# Patient Record
Sex: Male | Born: 1955 | Race: White | Hispanic: No | Smoking: Former smoker
Health system: Southern US, Community
[De-identification: ages and names within clinical notes are randomized; demographics above are authoritative.]

## PROBLEM LIST (undated history)

## (undated) DIAGNOSIS — I428 Other cardiomyopathies: Secondary | ICD-10-CM

## (undated) DIAGNOSIS — Z95810 Presence of automatic (implantable) cardiac defibrillator: Secondary | ICD-10-CM

## (undated) DIAGNOSIS — G931 Anoxic brain damage, not elsewhere classified: Secondary | ICD-10-CM

## (undated) DIAGNOSIS — D7582 Heparin induced thrombocytopenia (HIT): Secondary | ICD-10-CM

## (undated) DIAGNOSIS — I469 Cardiac arrest, cause unspecified: Secondary | ICD-10-CM

## (undated) DIAGNOSIS — E669 Obesity, unspecified: Secondary | ICD-10-CM

## (undated) DIAGNOSIS — I10 Essential (primary) hypertension: Secondary | ICD-10-CM

## (undated) DIAGNOSIS — D75829 Heparin-induced thrombocytopenia, unspecified: Secondary | ICD-10-CM

## (undated) DIAGNOSIS — I4901 Ventricular fibrillation: Secondary | ICD-10-CM

## (undated) DIAGNOSIS — E039 Hypothyroidism, unspecified: Secondary | ICD-10-CM

## (undated) DIAGNOSIS — I480 Paroxysmal atrial fibrillation: Secondary | ICD-10-CM

## (undated) DIAGNOSIS — I5189 Other ill-defined heart diseases: Secondary | ICD-10-CM

## (undated) DIAGNOSIS — I515 Myocardial degeneration: Secondary | ICD-10-CM

## (undated) DIAGNOSIS — E876 Hypokalemia: Secondary | ICD-10-CM

## (undated) DIAGNOSIS — I472 Ventricular tachycardia: Secondary | ICD-10-CM

## (undated) HISTORY — DX: Other cardiomyopathies: I42.8

## (undated) HISTORY — DX: Ventricular tachycardia: I47.2

## (undated) HISTORY — DX: Anoxic brain damage, not elsewhere classified: G93.1

## (undated) HISTORY — DX: Obesity, unspecified: E66.9

---

## 1998-05-20 ENCOUNTER — Encounter: Payer: Self-pay | Admitting: Neurosurgery

## 1998-05-20 ENCOUNTER — Ambulatory Visit (HOSPITAL_COMMUNITY): Admission: RE | Admit: 1998-05-20 | Discharge: 1998-05-20 | Payer: Self-pay | Admitting: Neurosurgery

## 1998-05-29 ENCOUNTER — Encounter: Payer: Self-pay | Admitting: Neurosurgery

## 1998-06-02 ENCOUNTER — Inpatient Hospital Stay (HOSPITAL_COMMUNITY): Admission: RE | Admit: 1998-06-02 | Discharge: 1998-06-03 | Payer: Self-pay | Admitting: Neurosurgery

## 1998-06-02 ENCOUNTER — Encounter: Payer: Self-pay | Admitting: Neurosurgery

## 1998-12-16 ENCOUNTER — Encounter: Payer: Self-pay | Admitting: Neurosurgery

## 1998-12-16 ENCOUNTER — Ambulatory Visit (HOSPITAL_COMMUNITY): Admission: RE | Admit: 1998-12-16 | Discharge: 1998-12-16 | Payer: Self-pay | Admitting: Neurosurgery

## 1999-01-08 ENCOUNTER — Encounter: Payer: Self-pay | Admitting: Neurosurgery

## 1999-01-08 ENCOUNTER — Inpatient Hospital Stay (HOSPITAL_COMMUNITY): Admission: RE | Admit: 1999-01-08 | Discharge: 1999-01-09 | Payer: Self-pay | Admitting: Neurosurgery

## 1999-02-03 ENCOUNTER — Encounter: Payer: Self-pay | Admitting: Neurosurgery

## 1999-02-03 ENCOUNTER — Encounter: Admission: RE | Admit: 1999-02-03 | Discharge: 1999-02-03 | Payer: Self-pay | Admitting: Neurosurgery

## 1999-03-10 ENCOUNTER — Encounter: Admission: RE | Admit: 1999-03-10 | Discharge: 1999-03-10 | Payer: Self-pay | Admitting: Neurosurgery

## 1999-03-10 ENCOUNTER — Encounter: Payer: Self-pay | Admitting: Neurosurgery

## 1999-05-12 ENCOUNTER — Encounter: Payer: Self-pay | Admitting: Neurosurgery

## 1999-05-12 ENCOUNTER — Encounter: Admission: RE | Admit: 1999-05-12 | Discharge: 1999-05-12 | Payer: Self-pay | Admitting: Neurosurgery

## 2015-10-21 DIAGNOSIS — G931 Anoxic brain damage, not elsewhere classified: Secondary | ICD-10-CM | POA: Insufficient documentation

## 2015-10-21 DIAGNOSIS — I472 Ventricular tachycardia, unspecified: Secondary | ICD-10-CM

## 2015-10-21 HISTORY — DX: Ventricular tachycardia, unspecified: I47.20

## 2015-10-21 HISTORY — DX: Anoxic brain damage, not elsewhere classified: G93.1

## 2015-10-21 HISTORY — DX: Ventricular tachycardia: I47.2

## 2015-11-16 ENCOUNTER — Emergency Department (HOSPITAL_COMMUNITY): Payer: Managed Care, Other (non HMO)

## 2015-11-16 ENCOUNTER — Inpatient Hospital Stay (HOSPITAL_COMMUNITY)
Admission: EM | Admit: 2015-11-16 | Discharge: 2015-12-03 | DRG: 224 | Disposition: A | Discharge status: Home / Self Care | Payer: Managed Care, Other (non HMO) | Attending: Cardiology | Admitting: Cardiology

## 2015-11-16 ENCOUNTER — Other Ambulatory Visit: Payer: Self-pay

## 2015-11-16 ENCOUNTER — Encounter (HOSPITAL_COMMUNITY)
Admission: EM | Disposition: A | Discharge status: Home / Self Care | Payer: Self-pay | Source: Home / Self Care | Attending: Pulmonary Disease

## 2015-11-16 ENCOUNTER — Inpatient Hospital Stay (HOSPITAL_COMMUNITY): Payer: Managed Care, Other (non HMO)

## 2015-11-16 DIAGNOSIS — I472 Ventricular tachycardia, unspecified: Secondary | ICD-10-CM

## 2015-11-16 DIAGNOSIS — I480 Paroxysmal atrial fibrillation: Secondary | ICD-10-CM | POA: Diagnosis present

## 2015-11-16 DIAGNOSIS — I959 Hypotension, unspecified: Secondary | ICD-10-CM | POA: Diagnosis present

## 2015-11-16 DIAGNOSIS — G9341 Metabolic encephalopathy: Secondary | ICD-10-CM | POA: Diagnosis present

## 2015-11-16 DIAGNOSIS — I10 Essential (primary) hypertension: Secondary | ICD-10-CM | POA: Diagnosis present

## 2015-11-16 DIAGNOSIS — D649 Anemia, unspecified: Secondary | ICD-10-CM | POA: Diagnosis present

## 2015-11-16 DIAGNOSIS — I5021 Acute systolic (congestive) heart failure: Secondary | ICD-10-CM | POA: Diagnosis present

## 2015-11-16 DIAGNOSIS — I4891 Unspecified atrial fibrillation: Secondary | ICD-10-CM | POA: Diagnosis not present

## 2015-11-16 DIAGNOSIS — E872 Acidosis: Secondary | ICD-10-CM | POA: Diagnosis present

## 2015-11-16 DIAGNOSIS — J9601 Acute respiratory failure with hypoxia: Secondary | ICD-10-CM | POA: Diagnosis present

## 2015-11-16 DIAGNOSIS — J9600 Acute respiratory failure, unspecified whether with hypoxia or hypercapnia: Secondary | ICD-10-CM

## 2015-11-16 DIAGNOSIS — J690 Pneumonitis due to inhalation of food and vomit: Secondary | ICD-10-CM | POA: Diagnosis not present

## 2015-11-16 DIAGNOSIS — R451 Restlessness and agitation: Secondary | ICD-10-CM | POA: Diagnosis not present

## 2015-11-16 DIAGNOSIS — R4020 Unspecified coma: Secondary | ICD-10-CM | POA: Diagnosis present

## 2015-11-16 DIAGNOSIS — R579 Shock, unspecified: Secondary | ICD-10-CM | POA: Diagnosis not present

## 2015-11-16 DIAGNOSIS — I110 Hypertensive heart disease with heart failure: Secondary | ICD-10-CM | POA: Diagnosis present

## 2015-11-16 DIAGNOSIS — I4901 Ventricular fibrillation: Secondary | ICD-10-CM | POA: Diagnosis present

## 2015-11-16 DIAGNOSIS — R0600 Dyspnea, unspecified: Secondary | ICD-10-CM

## 2015-11-16 DIAGNOSIS — I429 Cardiomyopathy, unspecified: Secondary | ICD-10-CM | POA: Diagnosis present

## 2015-11-16 DIAGNOSIS — Z79899 Other long term (current) drug therapy: Secondary | ICD-10-CM

## 2015-11-16 DIAGNOSIS — Z6839 Body mass index (BMI) 39.0-39.9, adult: Secondary | ICD-10-CM

## 2015-11-16 DIAGNOSIS — I4729 Other ventricular tachycardia: Secondary | ICD-10-CM

## 2015-11-16 DIAGNOSIS — E876 Hypokalemia: Secondary | ICD-10-CM | POA: Diagnosis not present

## 2015-11-16 DIAGNOSIS — G9340 Encephalopathy, unspecified: Secondary | ICD-10-CM

## 2015-11-16 DIAGNOSIS — D75829 Heparin-induced thrombocytopenia, unspecified: Secondary | ICD-10-CM | POA: Diagnosis present

## 2015-11-16 DIAGNOSIS — R402433 Glasgow coma scale score 3-8, at hospital admission: Secondary | ICD-10-CM | POA: Diagnosis present

## 2015-11-16 DIAGNOSIS — E669 Obesity, unspecified: Secondary | ICD-10-CM | POA: Diagnosis present

## 2015-11-16 DIAGNOSIS — Z452 Encounter for adjustment and management of vascular access device: Secondary | ICD-10-CM

## 2015-11-16 DIAGNOSIS — R570 Cardiogenic shock: Secondary | ICD-10-CM | POA: Diagnosis present

## 2015-11-16 DIAGNOSIS — H15092 Other scleritis, left eye: Secondary | ICD-10-CM | POA: Diagnosis present

## 2015-11-16 DIAGNOSIS — D7582 Heparin induced thrombocytopenia (HIT): Secondary | ICD-10-CM | POA: Diagnosis not present

## 2015-11-16 DIAGNOSIS — I469 Cardiac arrest, cause unspecified: Secondary | ICD-10-CM | POA: Diagnosis present

## 2015-11-16 DIAGNOSIS — Z95810 Presence of automatic (implantable) cardiac defibrillator: Secondary | ICD-10-CM | POA: Diagnosis present

## 2015-11-16 DIAGNOSIS — J9690 Respiratory failure, unspecified, unspecified whether with hypoxia or hypercapnia: Secondary | ICD-10-CM

## 2015-11-16 DIAGNOSIS — I361 Nonrheumatic tricuspid (valve) insufficiency: Secondary | ICD-10-CM | POA: Diagnosis present

## 2015-11-16 DIAGNOSIS — Z95818 Presence of other cardiac implants and grafts: Secondary | ICD-10-CM

## 2015-11-16 DIAGNOSIS — K921 Melena: Secondary | ICD-10-CM | POA: Diagnosis not present

## 2015-11-16 DIAGNOSIS — G931 Anoxic brain damage, not elsewhere classified: Secondary | ICD-10-CM

## 2015-11-16 DIAGNOSIS — Z959 Presence of cardiac and vascular implant and graft, unspecified: Secondary | ICD-10-CM | POA: Diagnosis not present

## 2015-11-16 DIAGNOSIS — R739 Hyperglycemia, unspecified: Secondary | ICD-10-CM | POA: Diagnosis present

## 2015-11-16 DIAGNOSIS — I5032 Chronic diastolic (congestive) heart failure: Secondary | ICD-10-CM

## 2015-11-16 DIAGNOSIS — Z8249 Family history of ischemic heart disease and other diseases of the circulatory system: Secondary | ICD-10-CM

## 2015-11-16 DIAGNOSIS — Z9289 Personal history of other medical treatment: Secondary | ICD-10-CM

## 2015-11-16 DIAGNOSIS — G8191 Hemiplegia, unspecified affecting right dominant side: Secondary | ICD-10-CM | POA: Diagnosis not present

## 2015-11-16 HISTORY — DX: Heparin induced thrombocytopenia (HIT): D75.82

## 2015-11-16 HISTORY — DX: Other ill-defined heart diseases: I51.89

## 2015-11-16 HISTORY — DX: Ventricular fibrillation: I46.9

## 2015-11-16 HISTORY — DX: Ventricular fibrillation: I49.01

## 2015-11-16 HISTORY — PX: CARDIAC CATHETERIZATION: SHX172

## 2015-11-16 HISTORY — DX: Essential (primary) hypertension: I10

## 2015-11-16 HISTORY — DX: Paroxysmal atrial fibrillation: I48.0

## 2015-11-16 HISTORY — DX: Presence of automatic (implantable) cardiac defibrillator: Z95.810

## 2015-11-16 HISTORY — DX: Hypokalemia: E87.6

## 2015-11-16 HISTORY — DX: Heparin-induced thrombocytopenia, unspecified: D75.829

## 2015-11-16 HISTORY — DX: Myocardial degeneration: I51.5

## 2015-11-16 LAB — I-STAT CHEM 8, ED
BUN: 24 mg/dL — ABNORMAL HIGH (ref 6–20)
CALCIUM ION: 1.15 mmol/L (ref 1.12–1.23)
CHLORIDE: 101 mmol/L (ref 101–111)
Creatinine, Ser: 1.6 mg/dL — ABNORMAL HIGH (ref 0.61–1.24)
Glucose, Bld: 258 mg/dL — ABNORMAL HIGH (ref 65–99)
HCT: 48 % (ref 39.0–52.0)
HEMOGLOBIN: 16.3 g/dL (ref 13.0–17.0)
POTASSIUM: 2.9 mmol/L — AB (ref 3.5–5.1)
SODIUM: 138 mmol/L (ref 135–145)
TCO2: 18 mmol/L (ref 0–100)

## 2015-11-16 LAB — I-STAT CG4 LACTIC ACID, ED: LACTIC ACID, VENOUS: 11.85 mmol/L — AB (ref 0.5–1.9)

## 2015-11-16 LAB — I-STAT TROPONIN, ED: Troponin i, poc: 0.05 ng/mL (ref 0.00–0.08)

## 2015-11-16 LAB — GLUCOSE, CAPILLARY: Glucose-Capillary: 176 mg/dL — ABNORMAL HIGH (ref 65–99)

## 2015-11-16 SURGERY — LEFT HEART CATH AND CORONARY ANGIOGRAPHY
Anesthesia: LOCAL

## 2015-11-16 MED ORDER — CISATRACURIUM BOLUS VIA INFUSION
0.1000 mg/kg | Freq: Once | INTRAVENOUS | Status: AC
  Administered 2015-11-16: 11.3 mg via INTRAVENOUS
  Filled 2015-11-16: qty 12

## 2015-11-16 MED ORDER — VERAPAMIL HCL 2.5 MG/ML IV SOLN
INTRAVENOUS | Status: DC | PRN
  Administered 2015-11-16: 22:00:00 via INTRA_ARTERIAL

## 2015-11-16 MED ORDER — SODIUM CHLORIDE 0.9 % IV SOLN
3.0000 g | Freq: Three times a day (TID) | INTRAVENOUS | Status: DC
  Administered 2015-11-17 (×3): 3 g via INTRAVENOUS
  Filled 2015-11-16 (×5): qty 3

## 2015-11-16 MED ORDER — LIDOCAINE HCL (PF) 1 % IJ SOLN
INTRAMUSCULAR | Status: AC
  Filled 2015-11-16: qty 30

## 2015-11-16 MED ORDER — IOPAMIDOL (ISOVUE-370) INJECTION 76%
INTRAVENOUS | Status: DC | PRN
  Administered 2015-11-16: 75 mL via INTRA_ARTERIAL

## 2015-11-16 MED ORDER — SODIUM CHLORIDE 0.9 % IV SOLN
100.0000 ug/h | INTRAVENOUS | Status: DC
  Administered 2015-11-16: 100 ug/h via INTRAVENOUS
  Administered 2015-11-17 – 2015-11-18 (×2): 275 ug/h via INTRAVENOUS
  Filled 2015-11-16 (×5): qty 50

## 2015-11-16 MED ORDER — HEPARIN (PORCINE) IN NACL 2-0.9 UNIT/ML-% IJ SOLN
INTRAMUSCULAR | Status: DC | PRN
  Administered 2015-11-16: 1500 mL

## 2015-11-16 MED ORDER — SODIUM CHLORIDE 0.9 % IV SOLN: 250.0000 mL | INTRAVENOUS | Status: DC | PRN

## 2015-11-16 MED ORDER — MIDAZOLAM HCL 2 MG/2ML IJ SOLN: 1.0000 mg | Freq: Once | INTRAMUSCULAR | Status: DC

## 2015-11-16 MED ORDER — SODIUM CHLORIDE 0.9 % IV SOLN
10.0000 ug/h | INTRAVENOUS | Status: DC
  Filled 2015-11-16: qty 50

## 2015-11-16 MED ORDER — POTASSIUM CHLORIDE 10 MEQ/50ML IV SOLN
10.0000 meq | INTRAVENOUS | Status: AC
  Administered 2015-11-17 (×4): 10 meq via INTRAVENOUS
  Filled 2015-11-16: qty 50

## 2015-11-16 MED ORDER — SODIUM CHLORIDE 0.9% FLUSH
3.0000 mL | Freq: Two times a day (BID) | INTRAVENOUS | Status: DC
  Administered 2015-11-17 – 2015-11-18 (×2): 3 mL via INTRAVENOUS

## 2015-11-16 MED ORDER — CISATRACURIUM BOLUS VIA INFUSION
0.0500 mg/kg | INTRAVENOUS | Status: DC | PRN
  Filled 2015-11-16: qty 6

## 2015-11-16 MED ORDER — MIDAZOLAM BOLUS VIA INFUSION
1.0000 mg | INTRAVENOUS | Status: DC | PRN
  Filled 2015-11-16: qty 1

## 2015-11-16 MED ORDER — FAMOTIDINE IN NACL 20-0.9 MG/50ML-% IV SOLN
20.0000 mg | Freq: Two times a day (BID) | INTRAVENOUS | Status: DC
  Administered 2015-11-17 – 2015-11-18 (×4): 20 mg via INTRAVENOUS
  Filled 2015-11-16 (×4): qty 50

## 2015-11-16 MED ORDER — SODIUM CHLORIDE 0.9 % WEIGHT BASED INFUSION
1.0000 mL/kg/h | INTRAVENOUS | Status: AC
  Administered 2015-11-17: 1 mL/kg/h via INTRAVENOUS

## 2015-11-16 MED ORDER — FENTANYL BOLUS VIA INFUSION
25.0000 ug | INTRAVENOUS | Status: DC | PRN
  Filled 2015-11-16: qty 25

## 2015-11-16 MED ORDER — ASPIRIN 300 MG RE SUPP
300.0000 mg | Freq: Once | RECTAL | Status: AC
  Administered 2015-11-16: 300 mg via RECTAL

## 2015-11-16 MED ORDER — SODIUM CHLORIDE 0.9% FLUSH: 3.0000 mL | INTRAVENOUS | Status: DC | PRN

## 2015-11-16 MED ORDER — AMIODARONE HCL IN DEXTROSE 360-4.14 MG/200ML-% IV SOLN
30.0000 mg/h | INTRAVENOUS | Status: DC
  Administered 2015-11-17 – 2015-11-19 (×5): 30 mg/h via INTRAVENOUS
  Filled 2015-11-16 (×5): qty 200

## 2015-11-16 MED ORDER — HEPARIN SODIUM (PORCINE) 5000 UNIT/ML IJ SOLN
4000.0000 [IU] | INTRAMUSCULAR | Status: AC
  Administered 2015-11-16: 4000 [IU] via INTRAVENOUS

## 2015-11-16 MED ORDER — ARTIFICIAL TEARS OP OINT
1.0000 "application " | TOPICAL_OINTMENT | Freq: Three times a day (TID) | OPHTHALMIC | Status: DC
  Administered 2015-11-17 (×4): 1 via OPHTHALMIC
  Filled 2015-11-16: qty 3.5

## 2015-11-16 MED ORDER — CISATRACURIUM BESYLATE (PF) 200 MG/20ML IV SOLN
1.0000 ug/kg/min | INTRAVENOUS | Status: DC
  Administered 2015-11-16: 1 ug/kg/min via INTRAVENOUS
  Administered 2015-11-17: 1.5 ug/kg/min via INTRAVENOUS
  Filled 2015-11-16 (×2): qty 20

## 2015-11-16 MED ORDER — PROPOFOL 1000 MG/100ML IV EMUL
INTRAVENOUS | Status: AC
  Administered 2015-11-16: 10 ug/kg/min
  Filled 2015-11-16: qty 100

## 2015-11-16 MED ORDER — LIDOCAINE HCL (PF) 1 % IJ SOLN
INTRAMUSCULAR | Status: DC | PRN
  Administered 2015-11-16: 2 mL

## 2015-11-16 MED ORDER — IOPAMIDOL (ISOVUE-370) INJECTION 76%
INTRAVENOUS | Status: AC
  Filled 2015-11-16: qty 100

## 2015-11-16 MED ORDER — VERAPAMIL HCL 2.5 MG/ML IV SOLN
INTRAVENOUS | Status: AC
  Filled 2015-11-16: qty 2

## 2015-11-16 MED ORDER — DEXTROSE 5 % IV SOLN
2.0000 ug/min | INTRAVENOUS | Status: DC
  Administered 2015-11-17: 5 ug/min via INTRAVENOUS
  Filled 2015-11-16: qty 4

## 2015-11-16 MED ORDER — AMIODARONE HCL IN DEXTROSE 360-4.14 MG/200ML-% IV SOLN
60.0000 mg/h | INTRAVENOUS | Status: DC
  Administered 2015-11-17: 60 mg/h via INTRAVENOUS
  Filled 2015-11-16: qty 200

## 2015-11-16 MED ORDER — FENTANYL CITRATE (PF) 100 MCG/2ML IJ SOLN
50.0000 ug | Freq: Once | INTRAMUSCULAR | Status: AC
  Administered 2015-11-16: 50 ug via INTRAVENOUS

## 2015-11-16 MED ORDER — EPINEPHRINE HCL 1 MG/ML IJ SOLN
0.5000 ug/min | INTRAMUSCULAR | Status: DC
  Administered 2015-11-16: 10 ug/min via INTRAVENOUS
  Filled 2015-11-16: qty 4

## 2015-11-16 MED ORDER — ENOXAPARIN SODIUM 40 MG/0.4ML ~~LOC~~ SOLN: 40.0000 mg | SUBCUTANEOUS | Status: DC

## 2015-11-16 MED ORDER — ASPIRIN 81 MG PO CHEW
CHEWABLE_TABLET | ORAL | Status: AC
  Filled 2015-11-16: qty 4

## 2015-11-16 MED ORDER — SODIUM CHLORIDE 0.9 % IV BOLUS (SEPSIS)
2000.0000 mL | Freq: Once | INTRAVENOUS | Status: AC
  Administered 2015-11-16: 2000 mL via INTRAVENOUS

## 2015-11-16 MED ORDER — MIDAZOLAM HCL 5 MG/ML IJ SOLN
2.0000 mg/h | INTRAMUSCULAR | Status: DC
  Administered 2015-11-17: 3 mg/h via INTRAVENOUS
  Administered 2015-11-17 (×2): 5 mg/h via INTRAVENOUS
  Administered 2015-11-18: 8 mg/h via INTRAVENOUS
  Administered 2015-11-18: 5 mg/h via INTRAVENOUS
  Filled 2015-11-16 (×6): qty 10

## 2015-11-16 MED ORDER — ASPIRIN 300 MG RE SUPP: 300.0000 mg | RECTAL | Status: DC

## 2015-11-16 MED ORDER — HEPARIN SODIUM (PORCINE) 1000 UNIT/ML IJ SOLN
INTRAMUSCULAR | Status: AC
  Filled 2015-11-16: qty 1

## 2015-11-16 MED ORDER — SODIUM CHLORIDE 0.9 % IV SOLN: 2000.0000 mL | Freq: Once | INTRAVENOUS | Status: DC

## 2015-11-16 MED ORDER — HEPARIN SODIUM (PORCINE) 1000 UNIT/ML IJ SOLN
INTRAMUSCULAR | Status: DC | PRN
  Administered 2015-11-16: 2000 [IU] via INTRAVENOUS

## 2015-11-16 SURGICAL SUPPLY — 13 items
CATH INFINITI 5 FR JL3.5 (CATHETERS) ×1 IMPLANT
CATH INFINITI 5FR MULTPACK ANG (CATHETERS) ×2 IMPLANT
DEVICE RAD COMP TR BAND LRG (VASCULAR PRODUCTS) ×1 IMPLANT
GLIDESHEATH SLEND SS 6F .021 (SHEATH) ×2 IMPLANT
HOVERMATT SINGLE USE (MISCELLANEOUS) ×2 IMPLANT
KIT ENCORE 26 ADVANTAGE (KITS) ×1 IMPLANT
KIT HEART LEFT (KITS) ×2 IMPLANT
PACK CARDIAC CATHETERIZATION (CUSTOM PROCEDURE TRAY) ×2 IMPLANT
SYR MEDRAD MARK V 150ML (SYRINGE) ×2 IMPLANT
TRANSDUCER W/STOPCOCK (MISCELLANEOUS) ×2 IMPLANT
TUBING CIL FLEX 10 FLL-RA (TUBING) ×2 IMPLANT
WIRE HI TORQ VERSACORE-J 145CM (WIRE) ×2 IMPLANT
WIRE SAFE-T 1.5MM-J .035X260CM (WIRE) ×1 IMPLANT

## 2015-11-16 NOTE — H&P (Addendum)
   C. C "cardiac arrest, code STEMI" HPI: Patient is intubated and sedated so history from ED MD and EMS "60 y/o man with unknown medical history had a witnessed cardiac arrest after coughing. EMS was called. Dont know what was the initial rhythm. ACLS protocol was initialized. Subsequent rhythm strips showed unstable Vt/ Vfib and he was shocked total of 4 times with ROSC. He was also given amiodarone and intubated. In ED his EKG shows sinus rhythm, IVCD andLAD with possible old IMI without any ischemic changes. He is started on hypothemia protocol Also given 5000 units heparin IV and rectal asa OGT  ED MD spoke with Dr. Sw[place] IC on call and plan to proceed with emergent LHC +/- SB PCI Review of Systems:    unable to obtain  No past medical history on file.  No current facility-administered medications on file prior to encounter.    No current outpatient prescriptions on file prior to encounter.      Allergies not on file  Social History   Social History  . Marital status: Married    Spouse name: N/A  . Number of children: N/A  . Years of education: N/A   Occupational History  . Not on file.   Social History Main Topics  . Smoking status: Not on file  . Smokeless tobacco: Not on file  . Alcohol use Not on file  . Drug use: Unknown  . Sexual activity: Not on file   Other Topics Concern  . Not on file   Social History Narrative  . No narrative on file    No family history on file.  PHYSICAL EXAM: Vitals:   11/16/15 2125 11/16/15 2132  BP: 118/80 (!) 75/53  Pulse: 96 95  Resp: (!) 28 23  Temp:     General:  Well appearing. Intubated and sedated HEENT: normal Neck: supple. no JVD. Carotids 2+ bilat;  Cor: PMI nondisplaced. Regular rate & rhythm Lungs: clear anteriorly  Abdomen: soft, nontender, nondistended. No hepatosplenomegaly. Extremities: no cyanosis, clubbing, rash, edema Neuro: intubated, sedated   ECG:  NSR, LAD, IVCD, old IMI   No results  found for this or any previous visit (from the past 24 hour(s)). No results found.   ASSESSMENT: 1. Out of hospital witnessed cardiac arrest. Unknown initial rhythm but had subsequent Vt/VFib. EKG now does not show STE 2. Acute resp failure   PLAN/DISCUSSION: 1. Admit to ICU per CCM 2. Hypothermia protocol per CCM  3. Emergent cath  4. Heparin and asa given  5. Standard labs   [employer]

## 2015-11-16 NOTE — Progress Notes (Signed)
Patient transported from Cath Lab to room 2H-13 without complications.

## 2015-11-16 NOTE — ED Triage Notes (Addendum)
Per EMS: Pt coughing, arrested. 4 defib shocks prior to EMS arrival. EMS arrived on scene @ 2015. EMS defib x 1, cardioversion x 1, defib'd one more time. EMS gave 150mg  amioderone, 200 fentanyl, and 10 versed. ETCO2 = 29. ROSC prior to arrival.

## 2015-11-16 NOTE — H&P (Signed)
PULMONARY / CRITICAL CARE MEDICINE   Name: Arthur Chase D Stoudt MRN: [id number] DOB: 04/20/1955    ADMISSION DATE:  11/16/2015 CONSULTATION DATE:  11/16/2015  REFERRING MD:  Dr. Peter SwazilandJordan  CHIEF COMPLAINT:  Cardiac arrest  HISTORY OF PRESENT ILLNESS:   60 year old male with obesity and hypertension was [facility] on 11/16/2015 after a cardiac arrest at home. His children provide history as he was comatose on our evaluation. They state that he had been in his usual state of health but on 11/16/2015 they could hear him breathing heavily from another room. They came immediately found him to be unresponsive. They called 911 and started CPR. The fire department arrived nearly immediately and her vital shock via AED and CPR. Paramedics arrived and found him to be in V. fib. Total time of CPR is estimated to be approximately 16 minutes. En route he had V. fib on 2 other occasions which were successfully defibrillated. In the emergency department he was noted to be agitated while intubated. He was taken to the Cath Lab. In the Cath Lab he was noted to have clean coronary arteries and a normal left ventricular ejection fraction. Left ventricular end-diastolic pressure was 18.  PAST MEDICAL HISTORY :  He  has no past medical history on file.  PAST SURGICAL HISTORY: He  has no past surgical history on file.  Allergies not on file  No current facility-administered medications on file prior to encounter.    No current outpatient prescriptions on file prior to encounter.    FAMILY HISTORY:  His has no family status information on file.    SOCIAL HISTORY: He    REVIEW OF SYSTEMS:   Cannot obtain due to intubation  SUBJECTIVE:  As above  VITAL SIGNS: BP 93/61   Pulse 83   Temp (!) 96.4 F (35.8 C) (Rectal)   Resp (!) 25   Wt 249 lb 1.9 oz (113 kg)   SpO2 100%   HEMODYNAMICS:    VENTILATOR SETTINGS: Vent Mode: PRVC FiO2 (%):  [100 %] 100 % Set  Rate:  [20 bmp-24 bmp] 24 bmp Vt Set:  [530 mL] 530 mL PEEP:  [16 cmH20] 16 cmH20 Plateau Pressure:  [28 cmH20] 28 cmH20  INTAKE / OUTPUT: No intake/output data recorded.  PHYSICAL EXAMINATION: General:  Obese, on ventilator Neuro:  Eyes open, flex his arms towards face but no purposeful movements does not localize to pain, does not track with eyes, does not follow commands HEENT:  Normocephalic atraumatic, endotracheal tube in place Cardiovascular:  Regular rate and rhythm, no murmurs gallops or rubs, 2+ radial pulses Lungs:  Few crackles bilaterally, vent supported breaths Abdomen:  Obese abdomen, nontender nondistended Musculoskeletal:  Normal bulk and tone Skin:  Cool to the touch, Refill intact,   LABS:  BMET  Recent Labs Lab 11/16/15 2143  NA 138  K 2.9*  CL 101  BUN 24*  CREATININE 1.60*  GLUCOSE 258*    Electrolytes No results for input(s): CALCIUM, MG, PHOS in the last 168 hours.  CBC  Recent Labs Lab 11/16/15 2143  HGB 16.3  HCT 48.0    Coag's No results for input(s): APTT, INR in the last 168 hours.  Sepsis Markers  Recent Labs Lab 11/16/15 2143  LATICACIDVEN 11.85*    ABG No results for input(s): PHART, PCO2ART, PO2ART in the last 168 hours.  Liver Enzymes No results for input(s): AST, ALT, ALKPHOS, BILITOT, ALBUMIN in the last 168 hours.  Cardiac  Enzymes No results for input(s): TROPONINI, PROBNP in the last 168 hours.  Glucose No results for input(s): GLUCAP in the last 168 hours.  Imaging Dg Chest Portable 1 View  Result Date: 11/16/2015 CLINICAL DATA:  Respiratory failure.  Intubation. EXAM: PORTABLE CHEST 1 VIEW COMPARISON:  None. FINDINGS: The endotracheal tube is 3.3 cm above the carina. The enteric tube extends at least to the distal esophagus, continuing B on the inferior edge of the image. Diffuse airspace opacities are present bilaterally, left worse than right. No large pneumothorax or large effusion evident on this supine  portable radiograph. IMPRESSION: 1. Satisfactorily positioned ETT. Enteric tube extends beyond the inferior edge of the image but reaches at least as far as the distal esophagus. 2. Diffuse airspace opacities bilaterally, left worse than right. Electronically Signed   By: Ellery Plunk M.D.   On: 11/16/2015 21:45     STUDIES:  11/16/2015 left heart catheterization clean coronary arteries, normal LVEDP, normal LV function  CULTURES: 11/16/2015 respiratory culture  ANTIBIOTICS: 11/16/2015 Unasyn  SIGNIFICANT EVENTS: 11/16/2015 admission  LINES/TUBES: 11/16/2015 endotracheal tube 11/11/2015 central venous line  DISCUSSION: 60 year old male with a past medical history significant for obesity and hypertension was admitted with a cardiac arrest on 11/16/2015. He was noted to be in ventricular fibrillation on arrival. No evidence of coronary artery disease based on left heart catheterization. Differential diagnosis of his cardiac arrest includes a primary cardiac arrhythmia (most likely) versus less likely an acute CNS event or a pulmonary embolism.  ASSESSMENT / PLAN:  PULMONARY A: Acute respiratory failure with hypoxemia Possible aspiration pneumonia Possible acute pulmonary edema P:   Check pro BNP See ID Full ventilatory support Ventilator associated pneumonia prevention protocol Check ABG  CARDIOVASCULAR A:  V. fib arrest P:  Check d-dimer Check echocardiogram Empiric full dose heparin if d-dimer positive Consider CT angiogram chest when appropriate Postcardiac arrest management per cardiology Amiodarone  RENAL A:   Hypokalemia P:   Stat repeat basic metabolic panel Monitor BMET and UOP Replace electrolytes as needed   GASTROINTESTINAL A:   No acute issues P:   OG tube Pepcid for stress ulcer prophylaxis  HEMATOLOGIC A:   No acute issues P:  Monitor for bleeding  INFECTIOUS A:   Possible aspiration pneumonia P:   Respiratory culture Start  Unasyn  ENDOCRINE A:   Hyperglycemia, common with induced hypothermia   P:   Monitor glucose, add sliding scale insulin if necessary  NEUROLOGIC A:   Cardiac arrest, at risk for anoxic brain injury P:   RASS goal: -5 Induced hypothermia protocol with heavy sedation and paralytics Stat head CT    FAMILY  - Updates: Sons neck and John updated bedside 11/16/2015  - Inter-disciplinary family meet or Palliative Care meeting due by:  Day 7  My cc time 50 minutes  Heber Camargito, MD [facility] Pager: [phone] Cell: [phone] A 11/16/2015, 11:17 PM

## 2015-11-16 NOTE — ED Notes (Signed)
MD at bedside. 

## 2015-11-16 NOTE — Procedures (Signed)
Central Venous Catheter Insertion Procedure Note Arthur Chase [id number] [birth year]  Procedure: Insertion of Central Venous Catheter Indications: Assessment of intravascular volume, Drug and/or fluid administration and Frequent blood sampling  Procedure Details Consent: Risks of procedure as well as the alternatives and risks of each were explained to the (patient/caregiver).  Consent for procedure obtained. Time Out: Verified patient identification, verified procedure, site/side was marked, verified correct patient position, special equipment/implants available, medications/allergies/relevent history reviewed, required imaging and test results available.  Performed  Maximum sterile technique was used including antiseptics, cap, gloves, gown, hand hygiene, mask and sheet. Skin prep: Chlorhexidine; local anesthetic administered A antimicrobial bonded/coated triple lumen catheter was placed in the left internal jugular vein using the Seldinger technique. Ultrasound guidance used.Yes.   Catheter placed to 20 cm. Blood aspirated via all 3 ports and then flushed x 3. Line sutured x 2 and dressing applied.  Evaluation Blood flow good Complications: No apparent complications Patient did tolerate procedure well. Chest X-ray ordered to verify placement.  CXR: pending.  Joneen Roach, AGACNP-BC [facility] Pulmonology/Critical Care Pager [phone] or [phone]  11/16/2015 11:47 PM

## 2015-11-16 NOTE — ED Notes (Signed)
2L COLD saline and ice packs placed.

## 2015-11-16 NOTE — Progress Notes (Signed)
  Amiodarone Drug - Drug Interaction Consult Note  Recommendations: none Amiodarone is metabolized by the cytochrome P450 system and therefore has the potential to cause many drug interactions. Amiodarone has an average plasma half-life of 50 days (range 20 to 100 days).   There is potential for drug interactions to occur several weeks or months after stopping treatment and the onset of drug interactions may be slow after initiating amiodarone.   []  Statins: Increased risk of myopathy. Simvastatin- restrict dose to 20mg  daily. Other statins: counsel patients to report any muscle pain or weakness immediately.  []  Anticoagulants: Amiodarone can increase anticoagulant effect. Consider warfarin dose reduction. Patients should be monitored closely and the dose of anticoagulant altered accordingly, remembering that amiodarone levels take several weeks to stabilize.  []  Antiepileptics: Amiodarone can increase plasma concentration of phenytoin, the dose should be reduced. Note that small changes in phenytoin dose can result in large changes in levels. Monitor patient and counsel on signs of toxicity.  []  Beta blockers: increased risk of bradycardia, AV block and myocardial depression. Sotalol - avoid concomitant use.  []   Calcium channel blockers (diltiazem and verapamil): increased risk of bradycardia, AV block and myocardial depression.  []   Cyclosporine: Amiodarone increases levels of cyclosporine. Reduced dose of cyclosporine is recommended.  []  Digoxin dose should be halved when amiodarone is started.  []  Diuretics: increased risk of cardiotoxicity if hypokalemia occurs.  []  Oral hypoglycemic agents (glyburide, glipizide, glimepiride): increased risk of hypoglycemia. Patient's glucose levels should be monitored closely when initiating amiodarone therapy.   []  Drugs that prolong the QT interval:  Torsades de pointes risk may be increased with concurrent use - avoid if possible.  Monitor QTc,  also keep magnesium/potassium WNL if concurrent therapy can't be avoided. Marland Kitchen Antibiotics: e.g. fluoroquinolones, erythromycin. . Antiarrhythmics: e.g. quinidine, procainamide, disopyramide, sotalol. . Antipsychotics: e.g. phenothiazines, haloperidol.  . Lithium, tricyclic antidepressants, and methadone. Thank Lottie Mussel T  11/16/2015 11:56 PM

## 2015-11-16 NOTE — Progress Notes (Signed)
Pharmacy Antibiotic Note  Arthur Chase is a 60 y.o. male admitted on 11/16/2015 with aspiration PNA.  Pharmacy has been consulted for Unasyn dosing. Wt 113 lg. Creat 1.6, Lactate 11.85, being cooled via hypothermia protocol. CXR: diffuse airspace opacities bilaterally, L>R.  Pt s/p cardiac arrest, CPR, cath lab noted clean coronary arteries and normal EF.   Plan: Unasyn 3 gm IV q8h  Weight: 249 lb 1.9 oz (113 kg)  Temp (24hrs), Avg:96.4 F (35.8 C), Min:96.4 F (35.8 C), Max:96.4 F (35.8 C)   Recent Labs Lab 11/16/15 2143  CREATININE 1.60*  LATICACIDVEN 11.85*    CrCl cannot be calculated (Unknown ideal weight.).    Allergies not on file Thank you for allowing pharmacy to be a part of this patient's care.  Herby Abraham, Pharm.D. [phone] 11/16/2015 11:51 PM

## 2015-11-16 NOTE — ED Notes (Signed)
CCNP at bedside.  

## 2015-11-16 NOTE — Progress Notes (Signed)
   11/16/15 2200  Clinical Encounter Type  Visited With Family  Visit Type ED;Code  Referral From Nurse  Spiritual Encounters  Spiritual Needs Prayer;Emotional  Stress Factors  Family Stress Factors Lack of knowledge;Loss of control  Chaplain met family in ED, provided prayer and comfort, facilitated medical updates, provided hospitality. Escorted family to [facility] waiting room while cath in progress. Patient likely to be moved directly to [facility] after procedure. Will check in again with family after cath. Matisse Salais, Chaplain

## 2015-11-17 ENCOUNTER — Inpatient Hospital Stay (HOSPITAL_COMMUNITY): Payer: Managed Care, Other (non HMO)

## 2015-11-17 ENCOUNTER — Encounter (HOSPITAL_COMMUNITY): Payer: Self-pay | Admitting: Cardiology

## 2015-11-17 DIAGNOSIS — R570 Cardiogenic shock: Secondary | ICD-10-CM

## 2015-11-17 DIAGNOSIS — I469 Cardiac arrest, cause unspecified: Secondary | ICD-10-CM

## 2015-11-17 DIAGNOSIS — R579 Shock, unspecified: Secondary | ICD-10-CM

## 2015-11-17 DIAGNOSIS — G9340 Encephalopathy, unspecified: Secondary | ICD-10-CM

## 2015-11-17 LAB — BASIC METABOLIC PANEL
ANION GAP: 14 (ref 5–15)
ANION GAP: 13 (ref 5–15)
ANION GAP: 8 (ref 5–15)
Anion gap: 13 (ref 5–15)
Anion gap: 12 (ref 5–15)
Anion gap: 11 (ref 5–15)
BUN: 16 mg/dL (ref 6–20)
BUN: 17 mg/dL (ref 6–20)
BUN: 19 mg/dL (ref 6–20)
BUN: 19 mg/dL (ref 6–20)
BUN: 18 mg/dL (ref 6–20)
BUN: 17 mg/dL (ref 6–20)
CALCIUM: 7.8 mg/dL — AB (ref 8.9–10.3)
CALCIUM: 7.8 mg/dL — AB (ref 8.9–10.3)
CALCIUM: 7.5 mg/dL — AB (ref 8.9–10.3)
CALCIUM: 7.1 mg/dL — AB (ref 8.9–10.3)
CHLORIDE: 110 mmol/L (ref 101–111)
CO2: 18 mmol/L — ABNORMAL LOW (ref 22–32)
CO2: 17 mmol/L — ABNORMAL LOW (ref 22–32)
CO2: 15 mmol/L — ABNORMAL LOW (ref 22–32)
CO2: 16 mmol/L — AB (ref 22–32)
CO2: 18 mmol/L — ABNORMAL LOW (ref 22–32)
CO2: 18 mmol/L — ABNORMAL LOW (ref 22–32)
CREATININE: 0.96 mg/dL (ref 0.61–1.24)
CREATININE: 0.98 mg/dL (ref 0.61–1.24)
Calcium: 7.7 mg/dL — ABNORMAL LOW (ref 8.9–10.3)
Calcium: 7.6 mg/dL — ABNORMAL LOW (ref 8.9–10.3)
Chloride: 107 mmol/L (ref 101–111)
Chloride: 109 mmol/L (ref 101–111)
Chloride: 108 mmol/L (ref 101–111)
Chloride: 106 mmol/L (ref 101–111)
Chloride: 109 mmol/L (ref 101–111)
Creatinine, Ser: 1.01 mg/dL (ref 0.61–1.24)
Creatinine, Ser: 0.99 mg/dL (ref 0.61–1.24)
Creatinine, Ser: 0.91 mg/dL (ref 0.61–1.24)
Creatinine, Ser: 1 mg/dL (ref 0.61–1.24)
GFR calc Af Amer: >60 mL/min (ref >60)
GFR calc Af Amer: >60 mL/min (ref >60)
GFR calc Af Amer: >60 mL/min (ref >60)
GFR calc non Af Amer: >60 mL/min (ref >60)
GLUCOSE: 196 mg/dL — AB (ref 65–99)
GLUCOSE: 173 mg/dL — AB (ref 65–99)
GLUCOSE: 191 mg/dL — AB (ref 65–99)
Glucose, Bld: 163 mg/dL — ABNORMAL HIGH (ref 65–99)
Glucose, Bld: 175 mg/dL — ABNORMAL HIGH (ref 65–99)
Glucose, Bld: 180 mg/dL — ABNORMAL HIGH (ref 65–99)
POTASSIUM: 2.2 mmol/L — AB (ref 3.5–5.1)
POTASSIUM: 2.6 mmol/L — AB (ref 3.5–5.1)
POTASSIUM: 2.9 mmol/L — AB (ref 3.5–5.1)
POTASSIUM: 3.2 mmol/L — AB (ref 3.5–5.1)
POTASSIUM: 3.3 mmol/L — AB (ref 3.5–5.1)
Potassium: 2.6 mmol/L — CL (ref 3.5–5.1)
SODIUM: 135 mmol/L (ref 135–145)
Sodium: 138 mmol/L (ref 135–145)
Sodium: 138 mmol/L (ref 135–145)
Sodium: 137 mmol/L (ref 135–145)
Sodium: 137 mmol/L (ref 135–145)
Sodium: 137 mmol/L (ref 135–145)

## 2015-11-17 LAB — HEPARIN LEVEL (UNFRACTIONATED)
HEPARIN UNFRACTIONATED: 0.1 [IU]/mL — AB (ref 0.30–0.70)
Heparin Unfractionated: 0.34 [IU]/mL (ref 0.30–0.70)

## 2015-11-17 LAB — CBC
HEMATOCRIT: 48.5 % (ref 39.0–52.0)
HEMATOCRIT: 48.3 % (ref 39.0–52.0)
HEMOGLOBIN: 16.2 g/dL (ref 13.0–17.0)
Hemoglobin: 16.7 g/dL (ref 13.0–17.0)
MCH: 33.4 pg (ref 26.0–34.0)
MCH: 32.3 pg (ref 26.0–34.0)
MCHC: 34.4 g/dL (ref 30.0–36.0)
MCHC: 33.5 g/dL (ref 30.0–36.0)
MCV: 97 fL (ref 78.0–100.0)
MCV: 96.4 fL (ref 78.0–100.0)
PLATELETS: 227 10*3/uL (ref 150–400)
Platelets: 193 10*3/uL (ref 150–400)
RBC: 5 MIL/uL (ref 4.22–5.81)
RBC: 5.01 MIL/uL (ref 4.22–5.81)
RDW: 12.8 % (ref 11.5–15.5)
RDW: 13 % (ref 11.5–15.5)
WBC: 7.7 10*3/uL (ref 4.0–10.5)
WBC: 5.9 10*3/uL (ref 4.0–10.5)

## 2015-11-17 LAB — POCT I-STAT 3, ART BLOOD GAS (G3+)
ACID-BASE DEFICIT: 15 mmol/L — AB (ref 0.0–2.0)
Acid-base deficit: 11 mmol/L — ABNORMAL HIGH (ref 0.0–2.0)
BICARBONATE: 18.4 meq/L — AB (ref 20.0–24.0)
BICARBONATE: 14.4 meq/L — AB (ref 20.0–24.0)
O2 SAT: 86 %
O2 SAT: 90 %
PCO2 ART: 46.1 mmHg — AB (ref 35.0–45.0)
PCO2 ART: 37 mmHg (ref 35.0–45.0)
PH ART: 7.192 — AB (ref 7.350–7.450)
PO2 ART: 54 mmHg — AB (ref 80.0–100.0)
PO2 ART: 58 mmHg — AB (ref 80.0–100.0)
Patient temperature: 34.2
Patient temperature: 32.8
TCO2: 20 mmol/L (ref 0–100)
TCO2: 16 mmol/L (ref 0–100)
pH, Arterial: 7.174 — CL (ref 7.350–7.450)

## 2015-11-17 LAB — GLUCOSE, CAPILLARY
GLUCOSE-CAPILLARY: 210 mg/dL — AB (ref 65–99)
GLUCOSE-CAPILLARY: 178 mg/dL — AB (ref 65–99)
GLUCOSE-CAPILLARY: 207 mg/dL — AB (ref 65–99)
GLUCOSE-CAPILLARY: 170 mg/dL — AB (ref 65–99)
GLUCOSE-CAPILLARY: 154 mg/dL — AB (ref 65–99)
GLUCOSE-CAPILLARY: 176 mg/dL — AB (ref 65–99)
GLUCOSE-CAPILLARY: 185 mg/dL — AB (ref 65–99)
Glucose-Capillary: 158 mg/dL — ABNORMAL HIGH (ref 65–99)
Glucose-Capillary: 177 mg/dL — ABNORMAL HIGH (ref 65–99)
Glucose-Capillary: 182 mg/dL — ABNORMAL HIGH (ref 65–99)
Glucose-Capillary: 183 mg/dL — ABNORMAL HIGH (ref 65–99)
Glucose-Capillary: 190 mg/dL — ABNORMAL HIGH (ref 65–99)
Glucose-Capillary: 194 mg/dL — ABNORMAL HIGH (ref 65–99)
Glucose-Capillary: 204 mg/dL — ABNORMAL HIGH (ref 65–99)

## 2015-11-17 LAB — APTT
APTT: 34 s (ref 24–36)
APTT: 41 s — AB (ref 24–36)

## 2015-11-17 LAB — TROPONIN I
TROPONIN I: 0.96 ng/mL — AB (ref <0.03)
TROPONIN I: 1.83 ng/mL — AB (ref <0.03)
TROPONIN I: 1.67 ng/mL — AB (ref <0.03)
TROPONIN I: 0.89 ng/mL — AB (ref <0.03)

## 2015-11-17 LAB — CARBOXYHEMOGLOBIN
Carboxyhemoglobin: 0.4 % — ABNORMAL LOW (ref 0.5–1.5)
Methemoglobin: 0.8 % (ref 0.0–1.5)
O2 Saturation: 64.1 %
Total hemoglobin: 17 g/dL (ref 13.5–18.0)

## 2015-11-17 LAB — POCT I-STAT, CHEM 8
BUN: 20 mg/dL (ref 6–20)
BUN: 20 mg/dL (ref 6–20)
CALCIUM ION: 1.13 mmol/L (ref 1.12–1.23)
CHLORIDE: 106 mmol/L (ref 101–111)
CREATININE: 0.8 mg/dL (ref 0.61–1.24)
Calcium, Ion: 1.14 mmol/L (ref 1.12–1.23)
Chloride: 106 mmol/L (ref 101–111)
Creatinine, Ser: 0.7 mg/dL (ref 0.61–1.24)
GLUCOSE: 193 mg/dL — AB (ref 65–99)
GLUCOSE: 167 mg/dL — AB (ref 65–99)
HCT: 49 % (ref 39.0–52.0)
HEMATOCRIT: 51 % (ref 39.0–52.0)
HEMOGLOBIN: 17.3 g/dL — AB (ref 13.0–17.0)
Hemoglobin: 16.7 g/dL (ref 13.0–17.0)
POTASSIUM: 2.3 mmol/L — AB (ref 3.5–5.1)
Potassium: 2.8 mmol/L — ABNORMAL LOW (ref 3.5–5.1)
Sodium: 143 mmol/L (ref 135–145)
Sodium: 141 mmol/L (ref 135–145)
TCO2: 20 mmol/L (ref 0–100)
TCO2: 16 mmol/L (ref 0–100)

## 2015-11-17 LAB — PHOSPHORUS: PHOSPHORUS: 3 mg/dL (ref 2.5–4.6)

## 2015-11-17 LAB — MRSA PCR SCREENING: MRSA by PCR: NEGATIVE

## 2015-11-17 LAB — BLOOD GAS, ARTERIAL
ACID-BASE DEFICIT: 10.6 mmol/L — AB (ref 0.0–2.0)
Bicarbonate: 16.2 mEq/L — ABNORMAL LOW (ref 20.0–24.0)
FIO2: 100
MECHVT: 530 mL
O2 SAT: 86 %
PATIENT TEMPERATURE: 91.4
PCO2 ART: 37.2 mmHg (ref 35.0–45.0)
PEEP/CPAP: 16 cmH2O
PH ART: 7.233 — AB (ref 7.350–7.450)
RATE: 31 resp/min
TCO2: 17.6 mmol/L (ref 0–100)
pO2, Arterial: 49.3 mmHg — ABNORMAL LOW (ref 80.0–100.0)

## 2015-11-17 LAB — POCT I-STAT 7, (LYTES, BLD GAS, ICA,H+H)
ACID-BASE DEFICIT: 11 mmol/L — AB (ref 0.0–2.0)
Acid-base deficit: 12 mmol/L — ABNORMAL HIGH (ref 0.0–2.0)
BICARBONATE: 15.6 meq/L — AB (ref 20.0–24.0)
Bicarbonate: 17 mEq/L — ABNORMAL LOW (ref 20.0–24.0)
Calcium, Ion: 1.09 mmol/L — ABNORMAL LOW (ref 1.12–1.23)
Calcium, Ion: 1.08 mmol/L — ABNORMAL LOW (ref 1.12–1.23)
HCT: 48 % (ref 39.0–52.0)
HEMATOCRIT: 49 % (ref 39.0–52.0)
HEMOGLOBIN: 16.7 g/dL (ref 13.0–17.0)
HEMOGLOBIN: 16.3 g/dL (ref 13.0–17.0)
O2 SAT: 81 %
O2 Saturation: 88 %
PCO2 ART: 46.3 mmHg — AB (ref 35.0–45.0)
PCO2 ART: 40.1 mmHg (ref 35.0–45.0)
PH ART: 7.173 — AB (ref 7.350–7.450)
PO2 ART: 57 mmHg — AB (ref 80.0–100.0)
PO2 ART: 67 mmHg — AB (ref 80.0–100.0)
POTASSIUM: 3.4 mmol/L — AB (ref 3.5–5.1)
Potassium: 2.8 mmol/L — ABNORMAL LOW (ref 3.5–5.1)
Sodium: 141 mmol/L (ref 135–145)
Sodium: 140 mmol/L (ref 135–145)
TCO2: 18 mmol/L (ref 0–100)
TCO2: 17 mmol/L (ref 0–100)
pH, Arterial: 7.198 — CL (ref 7.350–7.450)

## 2015-11-17 LAB — HEPATIC FUNCTION PANEL
ALK PHOS: 44 U/L (ref 38–126)
ALT: 79 U/L — AB (ref 17–63)
AST: 108 U/L — ABNORMAL HIGH (ref 15–41)
Albumin: 3.3 g/dL — ABNORMAL LOW (ref 3.5–5.0)
TOTAL PROTEIN: 5.8 g/dL — AB (ref 6.5–8.1)
Total Bilirubin: 0.5 mg/dL (ref 0.3–1.2)

## 2015-11-17 LAB — PROTIME-INR
INR: 1.16
INR: 1.19
PROTHROMBIN TIME: 14.9 s (ref 11.4–15.2)
Prothrombin Time: 15.2 seconds (ref 11.4–15.2)

## 2015-11-17 LAB — D-DIMER, QUANTITATIVE: D-Dimer, Quant: 8.63 ug/mL-FEU — ABNORMAL HIGH (ref 0.00–0.50)

## 2015-11-17 LAB — BRAIN NATRIURETIC PEPTIDE: B Natriuretic Peptide: 41.7 pg/mL (ref 0.0–100.0)

## 2015-11-17 LAB — ECHOCARDIOGRAM COMPLETE: WEIGHTICAEL: 3985.92 [oz_av]

## 2015-11-17 LAB — MAGNESIUM: MAGNESIUM: 1.8 mg/dL (ref 1.7–2.4)

## 2015-11-17 LAB — LACTIC ACID, PLASMA: LACTIC ACID, VENOUS: 3.2 mmol/L — AB (ref 0.5–1.9)

## 2015-11-17 MED ORDER — MILRINONE LACTATE IN DEXTROSE 20-5 MG/100ML-% IV SOLN
0.1250 ug/kg/min | INTRAVENOUS | Status: DC
  Administered 2015-11-17 – 2015-11-19 (×4): 0.25 ug/kg/min via INTRAVENOUS
  Filled 2015-11-17 (×5): qty 100

## 2015-11-17 MED ORDER — SODIUM BICARBONATE 8.4 % IV SOLN
INTRAVENOUS | Status: DC
  Administered 2015-11-17 – 2015-11-19 (×4): via INTRAVENOUS
  Filled 2015-11-17 (×9): qty 850

## 2015-11-17 MED ORDER — CHLORHEXIDINE GLUCONATE 0.12 % MT SOLN
15.0000 mL | Freq: Two times a day (BID) | OROMUCOSAL | Status: DC
  Administered 2015-11-17 – 2015-11-24 (×15): 15 mL via OROMUCOSAL
  Filled 2015-11-17 (×10): qty 15

## 2015-11-17 MED ORDER — SODIUM CHLORIDE 0.9 % IV SOLN
INTRAVENOUS | Status: DC
  Administered 2015-11-17: 17:00:00 via INTRAVENOUS

## 2015-11-17 MED ORDER — ORAL CARE MOUTH RINSE
15.0000 mL | OROMUCOSAL | Status: DC
  Administered 2015-11-17 – 2015-11-24 (×71): 15 mL via OROMUCOSAL

## 2015-11-17 MED ORDER — SODIUM BICARBONATE 8.4 % IV SOLN
INTRAVENOUS | Status: AC
  Administered 2015-11-17: 50 meq
  Filled 2015-11-17: qty 100

## 2015-11-17 MED ORDER — MAGNESIUM SULFATE IN D5W 1-5 GM/100ML-% IV SOLN
1.0000 g | Freq: Once | INTRAVENOUS | Status: AC
  Administered 2015-11-17: 1 g via INTRAVENOUS
  Filled 2015-11-17: qty 100

## 2015-11-17 MED ORDER — SODIUM CHLORIDE 0.9 % IV SOLN
INTRAVENOUS | Status: DC
  Administered 2015-11-17 – 2015-11-29 (×4): via INTRAVENOUS

## 2015-11-17 MED ORDER — POTASSIUM CHLORIDE 10 MEQ/50ML IV SOLN
10.0000 meq | INTRAVENOUS | Status: AC
Start: 2015-11-17 — End: 2015-11-17
  Administered 2015-11-17 (×4): 10 meq via INTRAVENOUS
  Filled 2015-11-17 (×3): qty 50

## 2015-11-17 MED ORDER — NOREPINEPHRINE BITARTRATE 1 MG/ML IV SOLN
2.0000 ug/min | INTRAVENOUS | Status: DC
  Administered 2015-11-17: 50 ug/min via INTRAVENOUS
  Administered 2015-11-17: 55 ug/min via INTRAVENOUS
  Administered 2015-11-17: 60 ug/min via INTRAVENOUS
  Administered 2015-11-17: 18 ug/min via INTRAVENOUS
  Administered 2015-11-18: 45 ug/min via INTRAVENOUS
  Administered 2015-11-19 (×2): 20 ug/min via INTRAVENOUS
  Administered 2015-11-19: 16 ug/min via INTRAVENOUS
  Administered 2015-11-21: 4 ug/min via INTRAVENOUS
  Filled 2015-11-17 (×7): qty 16

## 2015-11-17 MED ORDER — DEXTROSE 5 % IV SOLN
1.0000 g | INTRAVENOUS | Status: DC
  Administered 2015-11-17: 1 g via INTRAVENOUS
  Filled 2015-11-17 (×2): qty 10

## 2015-11-17 MED ORDER — POTASSIUM CHLORIDE 20 MEQ/15ML (10%) PO SOLN
40.0000 meq | Freq: Once | ORAL | Status: AC
  Administered 2015-11-17: 40 meq
  Filled 2015-11-17: qty 30

## 2015-11-17 MED ORDER — POTASSIUM CHLORIDE 10 MEQ/50ML IV SOLN
10.0000 meq | INTRAVENOUS | Status: AC
  Administered 2015-11-17 (×2): 10 meq via INTRAVENOUS
  Filled 2015-11-17 (×3): qty 50

## 2015-11-17 MED ORDER — DOBUTAMINE IN D5W 4-5 MG/ML-% IV SOLN
2.5000 ug/kg/min | INTRAVENOUS | Status: DC
  Administered 2015-11-17: 2.5 ug/kg/min via INTRAVENOUS
  Filled 2015-11-17: qty 250

## 2015-11-17 MED ORDER — HEPARIN BOLUS VIA INFUSION
2000.0000 [IU] | Freq: Once | INTRAVENOUS | Status: AC
  Administered 2015-11-17: 2000 [IU] via INTRAVENOUS
  Filled 2015-11-17: qty 2000

## 2015-11-17 MED ORDER — HEPARIN (PORCINE) IN NACL 100-0.45 UNIT/ML-% IJ SOLN
1400.0000 [IU]/h | INTRAMUSCULAR | Status: DC
  Administered 2015-11-17: 900 [IU]/h via INTRAVENOUS
  Administered 2015-11-18: 1250 [IU]/h via INTRAVENOUS
  Filled 2015-11-17 (×2): qty 250

## 2015-11-17 NOTE — Procedures (Signed)
ELECTROENCEPHALOGRAM REPORT  Date of Study: 11/17/15  Patient's Name: UBALDO SIVA MRN: [id number] Date of Birth: 1955-06-15  Clinical History:  60 y/o male s/p V-fib arrest   Technical Summary: A multichannel digital EEG recording measured by the international 10-20 system with electrodes applied with paste and impedances below 5000 ohms performed in our laboratory with EKG monitoring in an intubated and sedated patient.  Hyperventilation and photic stimulation were not performed.  The digital EEG was referentially recorded, reformatted, and digitally filtered in a variety of bipolar and referential montages for optimal display.    Description: The patient is intubated and on the ventilator, on sedation during the recording. There is loss of normal background activity. The record is read at a sensitivity of 3 uV/mm shows diffuse suppression of background activity. There is no spontaneous reactivity.  There is muscle artifact over the frontal leads. Hyperventilation and photic stimulation were not performed. There were no epileptiform discharges or electrographic seizures seen.    Impression: This EEG is markedly abnormal due to diffuse background suppression of electrocortical activity.  Clinical Correlation of the above findings indicates severe diffuse cerebral dysfunction that is non-specific in etiology and can be seen in the setting of anoxic/ischemic injury, toxic/metabolic encephalopathies, or medication effect. In the absence of CNS active, sedating, or anesthetic medications, this suggests a poor prognosis. Clinical correlation is advised.  Kerin Salen, DO

## 2015-11-17 NOTE — Progress Notes (Signed)
eLink Physician-Brief Progress Note Patient Name: URHO MANDEL DOB: [birth year] MRN: [id number]   Date of Service  11/17/2015  HPI/Events of Note  ABG on 100%/PRVC 24/TV 530/P 16 = 7.19/46.1/54.0/18.9  eICU Interventions  Will order: 1. Increase PRVC rate to 31. 2. ABG at 3:45 AM.      Intervention Category Major Interventions: Acid-Base disturbance - evaluation and management;Respiratory failure - evaluation and management  Syvanna Ciolino Eugene 11/17/2015, 2:35 AM

## 2015-11-17 NOTE — Progress Notes (Signed)
ANTICOAGULATION CONSULT NOTE - Initial Consult  Pharmacy Consult for heparin Indication: R/O PE (cath clean) - on hypothermia protocol  Allergies not on file  Patient Measurements: Height: 5' 9.69" (177 cm) Weight: 249 lb 1.9 oz (113 kg) IBW/kg (Calculated) : 72.28 Heparin Dosing Weight: estimate 92 kg   Vital Signs: Temp: 90.5 F (32.5 C) (08/28 1000) Temp Source: Core (Comment) (08/28 0800) BP: 98/71 (08/28 0807) Pulse Rate: 67 (08/28 1045)  Labs:  Recent Labs  11/17/15 0216 11/17/15 0306 11/17/15 0404 11/17/15 0510 11/17/15 0606 11/17/15 0905  HGB  --  16.7 16.7  --  16.3  --   HCT  --  48.5 49.0  --  48.0  --   PLT  --  227  --   --   --   --   APTT 34  --   --   --   --   --   LABPROT 14.9  --   --   --   --   --   INR 1.16  --   --   --   --   --   HEPARINUNFRC  --   --   --   --   --  0.10*  CREATININE 0.96 1.01  --  0.99  --   --   TROPONINI 0.96*  --   --  1.83*  --   --     Estimated Creatinine Clearance: 99.4 mL/min (by C-G formula based on SCr of 0.99 mg/dL).   Medical History: No past medical history on file.  Assessment: 60 yo M s/p cardiac arrest with CPR, now cooled via the hypothermia protocol. Pharmacy intially consulted to dose heparin for ACS/STEMI, however, cath clean. Now heparin dosing for r/o PE. Heparin level subtherapeutic this AM at <0.10. Hgb, platelets normal and stable. No s/s bleeding noted. Conservative bolus and rate increase while on hypothermia protocol.   *Plan to re-warm starting around 0300 8/29.   Goal of Therapy:  Heparin level 0.3-0.7 units/ml Monitor platelets by anticoagulation protocol: Yes   Plan:  Bolus heparin 2000 units once Increase heparin drip to 1150 units/hr  Heparin level in 6 hours Daily heparin level and CBC  F/u for re-warming timing to adjust heparin rate accordingly  York Cerise, PharmD Pharmacy Resident  Pager [phone] 11/17/15 11:10 AM

## 2015-11-17 NOTE — Progress Notes (Addendum)
    Subjective:  Intubated and sedated, hypothermia protocol  Objective:  Vital Signs in the last 24 hours: Temp:  [89.1 F (31.7 C)-96.4 F (35.8 C)] 91 F (32.8 C) (08/28 0800) Pulse Rate:  [0-99] 75 (08/28 0730) Resp:  [0-31] 9 (08/28 0730) BP: (75-161)/(53-97) 118/71 (08/28 0115) SpO2:  [0 %-100 %] 94 % (08/28 0730) Arterial Line BP: (99-154)/(56-101) 129/80 (08/28 0730) FiO2 (%):  [100 %] 100 % (08/28 0312) Weight:  [113 kg (249 lb 1.9 oz)] 113 kg (249 lb 1.9 oz) (08/27 2300)  Intake/Output from previous day: 08/27 0701 - 08/28 0700 In: 1445.2 [I.V.:995.2; IV Piggyback:450] Out: 2380 [Urine:2380]  Physical Exam: Pt is intubated, sedated, and paralyzed HEENT: normal Neck: JVP - normal Lungs: Diffuse rhonchi bilaterally CV: RRR without murmur or gallop Abd: soft, bowel sounds present Ext: Trace edema Skin: warm/dry no rash   Lab Results:  Recent Labs  11/17/15 0306 11/17/15 0404 11/17/15 0606  WBC 7.7  --   --   HGB 16.7 16.7 16.3  PLT 227  --   --     Recent Labs  11/17/15 0306  11/17/15 0510 11/17/15 0606  NA 138  < > 137 140  K 2.6*  < > 2.9* 3.4*  CL 109  --  108  --   CO2 17*  --  15*  --   GLUCOSE 173*  --  191*  --   BUN 17  --  19  --   CREATININE 1.01  --  0.99  --   < > = values in this interval not displayed.  Recent Labs  11/17/15 0216 11/17/15 0510  TROPONINI 0.96* 1.83*    Cardiac Studies: Cath: Conclusion     The left ventricular systolic function is normal.  LV end diastolic pressure is normal.  The left ventricular ejection fraction is 55-65% by visual estimate.   1. Normal coronary anatomy 2. Normal LV function  Plan: monitor in ICU. Further work up and management per CCM.   Tele: Sinus rhythm with frequent PVCs and ventricular couplets  Assessment/Plan:  1. Out of hospital cardiac arrest, unclear etiology 2. Normal coronary arteries and normal LV function demonstrated at cardiac catheterization 3. Acute  respiratory failure 4. Frequent PVCs 5. Hypokalemia 6. Likely anoxic encephalopathy, clinically too early to prognosticate 7. Prolonged QT  Recommend: Continued supportive care. Will replete potassium. Await 2-D echocardiogram result. The patient will re-warm beginning at 0300 tomorrow. Follow neuro status after rewarming. Management of respiratory failure per CCM. Will consider formal EP evaluation pending neurologic recovery. EKG's reviewed and demonstrate sinus rhythm, frequent PVC's, prolonged QT interval.  No family at bedside. Discussed at length with nursing.  Tonny Bollman, M.D. 11/17/2015, 8:38 AM

## 2015-11-17 NOTE — Procedures (Signed)
Pt transported from ICU room to CT then back to ICU room without complications.

## 2015-11-17 NOTE — Progress Notes (Signed)
eLink Physician-Brief Progress Note Patient Name: Arthur Chase DOB: [birth year] MRN: [id number]   Date of Service  11/17/2015  HPI/Events of Note  K+ = 2.6, Mg++ = 1.8 and Creatinine = 1.01. K+ already in process of being replaced.   eICU Interventions  Will replace Mg++.     Intervention Category Intermediate Interventions: Electrolyte abnormality - evaluation and management  Richa Shor Eugene 11/17/2015, 4:46 AM

## 2015-11-17 NOTE — Progress Notes (Signed)
PULMONARY / CRITICAL CARE MEDICINE   Name: JESSY REDHOUSE MRN: [id number] DOB: 02/25/1956    ADMISSION DATE:  11/16/2015 CONSULTATION DATE:  11/16/2015  REFERRING MD:  Dr. Peter [place]and  CHIEF COMPLAINT:  Cardiac arrest  HISTORY OF PRESENT ILLNESS:   60 year old male with obesity and hypertension was brought to the [facility] emergency department on 11/16/2015 after a cardiac arrest at home. His children provide history as he was comatose on our evaluation. They state that he had been in his usual state of health but on 11/16/2015 they could hear him breathing heavily from another room. They came immediately found him to be unresponsive. They called 911 and started CPR. The fire department arrived nearly immediately and her vital shock via AED and CPR. Paramedics arrived and found him to be in V. fib. Total time of CPR is estimated to be approximately 16 minutes. En route he had V. fib on 2 other occasions which were successfully defibrillated. In the emergency department he was noted to be agitated while intubated. He was taken to the Cath Lab. In the Cath Lab he was noted to have clean coronary arteries and a normal left ventricular ejection fraction. Left ventricular end-diastolic pressure was 18.  SUBJECTIVE:  No events overnight.  VITAL SIGNS: BP 98/71   Pulse 71   Temp (!) 90.9 F (32.7 C)   Resp (!) 31   Wt 113 kg (249 lb 1.9 oz)   SpO2 92%   HEMODYNAMICS: CVP:  [11 mmHg-14 mmHg] 11 mmHg  VENTILATOR SETTINGS: Vent Mode: PRVC FiO2 (%):  [100 %] 100 % Set Rate:  [20 bmp-31 bmp] 31 bmp Vt Set:  [530 mL] 530 mL PEEP:  [16 cmH20-18 cmH20] 18 cmH20 Plateau Pressure:  [28 cmH20-34 cmH20] 34 cmH20  INTAKE / OUTPUT: I/O last 3 completed shifts: In: 1445.2 [I.V.:995.2; IV Piggyback:450] Out: 2380 [Urine:2380]  PHYSICAL EXAMINATION: General:  Obese, on ventilator. Neuro:  Sedated and paralyzed. HEENT:  Normocephalic atraumatic, endotracheal tube in place Cardiovascular:   Regular rate and rhythm, no murmurs gallops or rubs, 2+ radial pulses Lungs:  Few crackles bilaterally, vent supported breaths Abdomen:  Obese abdomen, nontender nondistended Musculoskeletal:  Normal bulk and tone Skin:  Cool to the touch, Refill intact,   LABS:  BMET  Recent Labs Lab 11/17/15 0216 11/17/15 0306 11/17/15 0404 11/17/15 0510 11/17/15 0606  NA 138 138 141 137 140  K 2.2* 2.6* 2.8* 2.9* 3.4*  CL 107 109  --  108  --   CO2 18* 17*  --  15*  --   BUN 16 17  --  19  --   CREATININE 0.96 1.01  --  0.99  --   GLUCOSE 196* 173*  --  191*  --    Electrolytes  Recent Labs Lab 11/17/15 0216 11/17/15 0306 11/17/15 0510  CALCIUM 7.8* 7.7* 7.8*  MG  --  1.8  --   PHOS  --  3.0  --    CBC  Recent Labs Lab 11/17/15 0306 11/17/15 0404 11/17/15 0606  WBC 7.7  --   --   HGB 16.7 16.7 16.3  HCT 48.5 49.0 48.0  PLT 227  --   --    Coag's  Recent Labs Lab 11/17/15 0216  APTT 34  INR 1.16   Sepsis Markers  Recent Labs Lab 11/16/15 2143 11/17/15 0218  LATICACIDVEN 11.85* 3.2*   ABG  Recent Labs Lab 11/17/15 0409 11/17/15 0606 11/17/15 0819  PHART 7.233* 7.198* 7.174*  PCO2ART 37.2 40.1 37.0  PO2ART 49.3* 67.0* 58.0*    Liver Enzymes No results for input(s): AST, ALT, ALKPHOS, BILITOT, ALBUMIN in the last 168 hours.  Cardiac Enzymes  Recent Labs Lab 11/17/15 0216 11/17/15 0510  TROPONINI 0.96* 1.83*   Glucose  Recent Labs Lab 11/17/15 0302 11/17/15 0354 11/17/15 0503 11/17/15 0603 11/17/15 0652 11/17/15 0812  GLUCAP 183* 178* 194* 207* 204* 170*   Imaging Ct Head Wo Contrast  Result Date: 11/17/2015 CLINICAL DATA:  Acute encephalopathy. Cardiac arrest with clean left heart catheterization today. EXAM: CT HEAD WITHOUT CONTRAST TECHNIQUE: Contiguous axial images were obtained from the base of the skull through the vertex without intravenous contrast. COMPARISON:  None. FINDINGS: Brain: Mild cerebral atrophy. No ventricular  dilatation. No mass effect or midline shift. No abnormal extra-axial fluid collections. Gray-white matter junctions are distinct. Basal cisterns are not effaced. No evidence of acute intracranial hemorrhage. Vascular: No hyperdense vessel or unexpected calcification. Skull: No depressed skull fractures. Sinuses/Orbits: Diffuse opacification of a ethmoid air cells and sphenoid sinuses with air-fluid levels in the maxillary antra and mucosal thickening in the frontal sinuses. Mastoid air cells are not opacified. Other: None. IMPRESSION: No acute intracranial abnormalities. Fluid in the paranasal sinuses. Electronically Signed   By: Burman NievesWilliam  Stevens M.D.   On: 11/17/2015 01:47   Dg Chest Port 1 View  Result Date: 11/17/2015 CLINICAL DATA:  Respiratory failure.  Central line placement. EXAM: PORTABLE CHEST 1 VIEW COMPARISON:  11/16/2015 at at 21:22 FINDINGS: There is a new left jugular central line with tip in the SVC at the azygos vein junction. No significant pneumothorax. Endotracheal tube remains satisfactorily positioned with its tip approximately 4.5 cm above the carina. The enteric tube extends into the stomach and beyond the inferior edge of the image. Airspace opacities persist bilaterally without significant interval change. IMPRESSION: 1. New left jugular central line appears satisfactorily positioned. No significant pneumothorax. 2. Continued satisfactory positions of the ET tube and NG tube. 3. Persistent airspace opacities bilaterally without significant interval change. Electronically Signed   By: Ellery Plunkaniel R Mitchell M.D.   On: 11/17/2015 01:19   Dg Chest Portable 1 View  Result Date: 11/16/2015 CLINICAL DATA:  Respiratory failure.  Intubation. EXAM: PORTABLE CHEST 1 VIEW COMPARISON:  None. FINDINGS: The endotracheal tube is 3.3 cm above the carina. The enteric tube extends at least to the distal esophagus, continuing B on the inferior edge of the image. Diffuse airspace opacities are present  bilaterally, left worse than right. No large pneumothorax or large effusion evident on this supine portable radiograph. IMPRESSION: 1. Satisfactorily positioned ETT. Enteric tube extends beyond the inferior edge of the image but reaches at least as far as the distal esophagus. 2. Diffuse airspace opacities bilaterally, left worse than right. Electronically Signed   By: Ellery Plunkaniel R Mitchell M.D.   On: 11/16/2015 21:45   STUDIES:  11/16/2015 left heart catheterization clean coronary arteries, normal LVEDP, normal LV function  CULTURES: 11/16/2015 respiratory culture  ANTIBIOTICS: 11/16/2015 Unasyn  SIGNIFICANT EVENTS: 11/16/2015 admission  LINES/TUBES: 11/16/2015 endotracheal tube 11/11/2015 central venous line  DISCUSSION: 60 year old male with a past medical history significant for obesity and hypertension was admitted with a cardiac arrest on 11/16/2015. He was noted to be in ventricular fibrillation on arrival. No evidence of coronary artery disease based on left heart catheterization. Differential diagnosis of his cardiac arrest includes a primary cardiac arrhythmia (most likely) versus less likely an acute CNS event or a pulmonary embolism.  ASSESSMENT / PLAN:  PULMONARY  A: Acute respiratory failure with hypoxemia Possible aspiration pneumonia Possible acute pulmonary edema ARDS P:   2D echo stat to determine RV function, if poor then will likely need lysis and warming up, if LV is poor then likely need an IABP See ID Full ventilatory support, increase RR to 35 and decrease I time to 0.7. Ventilator associated pneumonia prevention protocol Check ABG  CARDIOVASCULAR A:  V. fib arrest P:  Echo as above. Empiric full dose heparin Consider CT angiogram chest when appropriate Postcardiac arrest management per cardiology Amiodarone Levophed at 42 mcg  RENAL A:   Hypokalemia P:   Stat repeat basic metabolic panel Monitor BMET and UOP Replace electrolytes as  needed Bicarb drip.  GASTROINTESTINAL A:   No acute issues P:   OG tube Pepcid for stress ulcer prophylaxis  HEMATOLOGIC A:   No acute issues P:  Monitor for bleeding  INFECTIOUS A:   Possible aspiration pneumonia P:   Respiratory culture. Unasyn, if EF is down then change to rocephin (high Na content).  ENDOCRINE A:   Hyperglycemia, common with induced hypothermia   P:   Monitor glucose, add sliding scale insulin if necessary.  NEUROLOGIC A:   Cardiac arrest, at risk for anoxic brain injury P:   RASS goal: -5 Induced hypothermia protocol with heavy sedation and paralytics  FAMILY  - Updates: No family bedside.  - Inter-disciplinary family meet or Palliative Care meeting due by:  Day 7  The patient is critically ill with multiple organ systems failure and requires high complexity decision making for assessment and support, frequent evaluation and titration of therapies, application of advanced monitoring technologies and extensive interpretation of multiple databases.   Critical Care Time devoted to patient care services described in this note is  35  Minutes. This time reflects time of care of this signee Dr Koren Bound. This critical care time does not reflect procedure time, or teaching time or supervisory time of PA/NP/Med student/Med Resident etc but could involve care discussion time.  Alyson Reedy, M.D. [facility] Pulmonary/Critical Care Medicine. Pager: [phone]. After hours pager: [phone].  11/17/2015, 9:23 AM

## 2015-11-17 NOTE — Progress Notes (Signed)
PCCM INTERVAL PROGRESS NOTE  Called to bedside by RN. Patient started re-warming early today due to ectopy and refractory hypokalemia. Once patient reaches 36C will plan to discontinue neuromuscular blockade. Maintain patient temp at [facility]. He is still requiring deep sedation in the setting of ARDS/Pulmonary edema.    Joneen Roach, AGACNP-BC [facility] Pulmonology/Critical Care Pager [phone] or [phone]  11/17/2015 8:49 PM

## 2015-11-17 NOTE — Progress Notes (Signed)
EEG completed; results pending.    

## 2015-11-17 NOTE — Progress Notes (Addendum)
eLink Physician-Brief Progress Note Patient Name: CERONE HISLE DOB: 1956-02-21 MRN: [id number]   Date of Service  11/17/2015  HPI/Events of Note  Hypotension - BP = 91/610  eICU Interventions  Will order: 1. Increase ceiling on Norepinephrine IV infusion to 80 mcg/min. 2. Please draw already ordered ABG for 3:45 AM now.      Intervention Category Major Interventions: Hypotension - evaluation and management  Sommer,Steven Dennard Nip 11/17/2015, 3:39 AM

## 2015-11-17 NOTE — Progress Notes (Signed)
  Echocardiogram 2D Echocardiogram has been performed.  Leta Jungling M 11/17/2015, 10:39 AM

## 2015-11-17 NOTE — Progress Notes (Signed)
eLink Physician-Brief Progress Note Patient Name: Arthur Chase DOB: [birth year] MRN: [id number]   Date of Service  11/17/2015  HPI/Events of Note  Multiple issues: 1. Lactic Acid = 11.85 >> 3.2. Lactic Acid is clearing and 2. Request for Flexiseal.   eICU Interventions  Will order Flexiseal.      Intervention Category Major Interventions: Acid-Base disturbance - evaluation and management Minor Interventions: Routine modifications to care plan (e.g. PRN medications for pain, fever)  Arthur Chase 11/17/2015, 2:56 AM

## 2015-11-17 NOTE — Progress Notes (Signed)
ANTICOAGULATION CONSULT NOTE - Initial Consult  Pharmacy Consult for heparin Indication: R/O PE (cath clean) - on hypothermia protocol  No Known Allergies  Patient Measurements: Height: 5' 9.69" (177 cm) Weight: 249 lb 1.9 oz (113 kg) IBW/kg (Calculated) : 72.28 Heparin Dosing Weight: estimate 92 kg   Vital Signs: Temp: 93 F (33.9 C) (08/28 1900) Temp Source: Core (Comment) (08/28 1600) BP: 104/66 (08/28 1625) Pulse Rate: 75 (08/28 1900)  Labs:  Recent Labs  11/17/15 0216 11/17/15 0306  11/17/15 0510 11/17/15 0606 11/17/15 0813 11/17/15 0905 11/17/15 1032 11/17/15 1600 11/17/15 1730 11/17/15 1816  HGB  --  16.7  < >  --  16.3 16.7  --   --   --   --  16.2  HCT  --  48.5  < >  --  48.0 49.0  --   --   --   --  48.3  PLT  --  227  --   --   --   --   --   --   --   --  193  APTT 34  --   --   --   --   --   --  41*  --   --   --   LABPROT 14.9  --   --   --   --   --   --  15.2  --   --   --   INR 1.16  --   --   --   --   --   --  1.19  --   --   --   HEPARINUNFRC  --   --   --   --   --   --  0.10*  --   --   --  0.34  CREATININE 0.96 1.01  --  0.99  --  0.70  --  0.98 0.91  --   --   TROPONINI 0.96*  --   --  1.83*  --   --   --  1.67*  --  0.89*  --   < > = values in this interval not displayed.  Estimated Creatinine Clearance: 108.2 mL/min (by C-G formula based on SCr of 0.91 mg/dL).   Medical History: No past medical history on file.  Assessment: 60 yo M s/p cardiac arrest with CPR, now cooled via the hypothermia protocol. Pharmacy intially consulted to dose heparin for ACS/STEMI, however, cath clean. Now heparin dosing for r/o PE. Heparin level now therapeutic on 1150 units/hr.  Will increase slightly to 1250 units/hr to target mid-range for r/o PE.    *Plan to re-warm starting around 0300 8/29.   Goal of Therapy:  Heparin level 0.3-0.7 units/ml Monitor platelets by anticoagulation protocol: Yes   Plan:  Increase heparin drip to 1250 units/hr   Heparin level in 6 hours Daily heparin level and CBC  F/u for re-warming timing to adjust heparin rate accordingly  [employer], Pharm.D., BCPS Clinical Pharmacist Pager [phone] 11/17/2015 7:20 PM

## 2015-11-17 NOTE — Procedures (Signed)
Arterial Catheter Insertion Procedure Note Arthur Chase [id number] February 08, 1956  Procedure: Insertion of Arterial Catheter  Indications: Blood pressure monitoring and Frequent blood sampling  Procedure Details Consent: Unable to obtain consent because of emergent medical necessity. Time Out: Verified patient identification, verified procedure, site/side was marked, verified correct patient position, special equipment/implants available, medications/allergies/relevent history reviewed, required imaging and test results available.  Performed  Maximum sterile technique was used including antiseptics, cap, gloves, gown, hand hygiene, mask and sheet. Skin prep: Chlorhexidine; local anesthetic administered 20 gauge catheter was inserted into left radial artery using the Seldinger technique.  Evaluation Blood flow good; BP tracing good. Complications: No apparent complications.   [name] 11/17/2015

## 2015-11-17 NOTE — Progress Notes (Signed)
ANTICOAGULATION CONSULT NOTE - Initial Consult  Pharmacy Consult for heparin Indication: ACS/STEMI - hypothermia protocol  Allergies not on file  Patient Measurements: Weight: 249 lb 1.9 oz (113 kg) Heparin Dosing Weight: estimate 92 kg with estimated ht of 72 inches  Vital Signs: Temp: 94.3 F (34.6 C) (08/28 0115) Temp Source: Core (Comment) (08/28 0115) BP: 93/61 (08/27 2234) Pulse Rate: 83 (08/27 2234)  Labs:  Recent Labs  11/16/15 2143  HGB 16.3  HCT 48.0  CREATININE 1.60*    CrCl cannot be calculated (Unknown ideal weight.).   Medical History: No past medical history on file.  Assessment: 60 yo M s/p cardiac arrest, CPR.  He is s/p cardiac cath and being cooled via the hypothermia protocol.  Pharmacy consulted to dose heparin for ACS/STEMI after head CT negative for bleed. Head CT is negative for bleed. He was given 4000 units of IV heparin 8/27 at 2133. Wt 113 kg, no height available, RN reports pt is at least 6 ft tall.  Will use 92 kg as heparin dosing weight.  H/H WNL.   Goal of Therapy:  Heparin level 0.3-0.7 units/ml Monitor platelets by anticoagulation protocol: Yes   Plan:  Start heparin drip at 900 units/hr with no bolus and check HL in 6 hours Daily HL/CBC while on heparin F/u for re-warming timing to adjust heparin rate accordingly  Herby Abraham, Pharm.D. [phone] 11/17/2015 2:09 AM

## 2015-11-17 NOTE — Progress Notes (Signed)
Initial Nutrition Assessment  DOCUMENTATION CODES:   Obesity unspecified  INTERVENTION:  When patient is rewarmed, recommend initiating Vital High Protein @ 55 ml/hr + 30 ml Prostat TID. Provides 1320 ml, 1620 calories, 161 grams protein, 1109 ml H2O.  Recommend initiating liquid MVM daily with TF as goal rate would not meet 100% RDIs.  RD will continue to follow.   NUTRITION DIAGNOSIS:   Inadequate oral intake related to inability to eat as evidenced by NPO status.  GOAL:   Provide needs based on ASPEN/SCCM guidelines  MONITOR:   Vent status, I & O's, Labs  REASON FOR ASSESSMENT:   Ventilator    ASSESSMENT:   60 y.o. Male with obesity, HTN, brought to the [facility] ED on 8/27 after a cardiac arrest at home. Total time of CPR estimated to be 16 minutes. Found to be in Vfib on 3 separate occasions, each successfully defibrillated.    Patient now in hypothermia protocol with plan to begin rewarming early morning of 8/29.  No family present at time of assessment. Patient appears well-nourished with no overt signs of malnutrition. No weight history in medical chart to trend.  MAP: >/= 65 since on norepinephrine (lowest was 62)  Patient has OG tube terminating in stomach per chest x-ray.  Patient is currently intubated on ventilator support MV: 19.5 L/min Temp (24hrs), Avg:91.7 F (33.2 C), Min:89.1 F (31.7 C), Max:96.4 F (35.8 C)  Medications reviewed and include: famotidine, potassium chloride 10 mEq plan for 4 times today, amiodarone gtt, cisatracurium gtt, fentanyl gtt, heparin gtt, versed gtt, norepinephrine gtt (rate still increasing), sodium bicarbonate 150 mEq gtt.  Labs reviewed: CBG 158-210 past 24 hrs, potassium 2.9 (receiving repletion).  Nutrition-Focused physical exam completed. Findings are no fat depletion, no muscle depletion, and no edema.   Discussed plan with RN. Team has not yet discussed whether planning for TF or not as patient is still on  hypothermia protocol. Will leave recommendations for when indicated.  Diet Order:     Skin:  Reviewed, no issues  Last BM:  11/17/2015  Height:   Ht Readings from Last 1 Encounters:  11/17/15 5' 9.69" (1.77 m)    Weight:   Wt Readings from Last 1 Encounters:  11/16/15 249 lb 1.9 oz (113 kg)    Ideal Body Weight:  75.45 kg  BMI:  Body mass index is 36.07 kg/m.  Estimated Nutritional Needs:   Kcal:  1243-1582 (11-14 kcal/kg)  Protein:  >/= 151 grams  Fluid:  >/= 1.2 L/day  EDUCATION NEEDS:   No education needs identified at this time  Helane Rima, MS, RD, LDN

## 2015-11-17 NOTE — Progress Notes (Signed)
eLink Physician-Brief Progress Note Patient Name: Arthur Chase DOB: 08-27-1955 MRN: [id number]   Date of Service  11/17/2015  HPI/Events of Note  ABG on 100%/PRVC 31/TV 530/P 16 = 7.23/37.2/49.3/16.2.  eICU Interventions  Will order: 1. Increase PEEP to 18. 2. NaHCO3 IV infusion at 75 mL/hour. 3. ABG at 7 AM.     Intervention Category Major Interventions: Acid-Base disturbance - evaluation and management;Respiratory failure - evaluation and management  Brianny Soulliere Eugene 11/17/2015, 5:10 AM

## 2015-11-17 NOTE — Procedures (Signed)
Pulmonary Artery Catheter Insertion Procedure Note Arthur Chase [id number] April 15, 1955  Procedure: Insertion of Pulmonary Artery Catheter  Indications: Assessment of intravascular volume and Guide hemodynamic management  Procedure Details Consent: Risks of procedure as well as the alternatives and risks of each were explained to the (patient/caregiver).  Consent for procedure obtained. Time Out: Verified patient identification, verified procedure, site/side was marked, verified correct patient position, special equipment/implants available, medications/allergies/relevent history reviewed, required imaging and test results available.  Performed  Description of Procedure Maximum sterile technique was used including antiseptics, cap, gloves, gown, hand hygiene, mask and sheet. Skin prep: Chlorhexidine; local anesthetic administered Pulmonary Artery Catheter was placed in the right internal jugular vein; initial position assessed by monitoring pressure waveform and catheter advanced after balloon inflation.  Evaluation Pressure waveform tracings: good, Pulmonary capillary wedge tracing: good, wedge tracing obtained after inflation of balloon with 1.25 - 1.5 cc. Complications: No apparent complications Patient did tolerate procedure well. Chest X-ray ordered to verify placement.  CXR: pending.  U/s used in placement and heparin held prior to insertion.  Arthur Chase 11/17/2015

## 2015-11-18 ENCOUNTER — Inpatient Hospital Stay (HOSPITAL_COMMUNITY): Payer: Managed Care, Other (non HMO)

## 2015-11-18 LAB — BASIC METABOLIC PANEL
Anion gap: 11 (ref 5–15)
Anion gap: 12 (ref 5–15)
Anion gap: 8 (ref 5–15)
BUN: 12 mg/dL (ref 6–20)
BUN: 16 mg/dL (ref 6–20)
BUN: 15 mg/dL (ref 6–20)
CALCIUM: 7 mg/dL — AB (ref 8.9–10.3)
CALCIUM: 7.1 mg/dL — AB (ref 8.9–10.3)
CHLORIDE: 106 mmol/L (ref 101–111)
CO2: 19 mmol/L — ABNORMAL LOW (ref 22–32)
CO2: 21 mmol/L — ABNORMAL LOW (ref 22–32)
CO2: 23 mmol/L (ref 22–32)
CREATININE: 0.75 mg/dL (ref 0.61–1.24)
Calcium: 7.2 mg/dL — ABNORMAL LOW (ref 8.9–10.3)
Chloride: 103 mmol/L (ref 101–111)
Chloride: 107 mmol/L (ref 101–111)
Creatinine, Ser: 0.82 mg/dL (ref 0.61–1.24)
Creatinine, Ser: 0.92 mg/dL (ref 0.61–1.24)
GFR calc Af Amer: >60 mL/min (ref >60)
GFR calc Af Amer: >60 mL/min (ref >60)
GFR calc non Af Amer: >60 mL/min (ref >60)
GLUCOSE: 162 mg/dL — AB (ref 65–99)
GLUCOSE: 150 mg/dL — AB (ref 65–99)
Glucose, Bld: 110 mg/dL — ABNORMAL HIGH (ref 65–99)
POTASSIUM: 3.8 mmol/L (ref 3.5–5.1)
Potassium: 3.4 mmol/L — ABNORMAL LOW (ref 3.5–5.1)
Potassium: 4.1 mmol/L (ref 3.5–5.1)
SODIUM: 137 mmol/L (ref 135–145)
Sodium: 136 mmol/L (ref 135–145)
Sodium: 137 mmol/L (ref 135–145)

## 2015-11-18 LAB — BLOOD GAS, ARTERIAL
Acid-base deficit: 3.9 mmol/L — ABNORMAL HIGH (ref 0.0–2.0)
Bicarbonate: 21.6 mmol/L (ref 20.0–28.0)
DRAWN BY: 347621
FIO2: 100
MECHVT: 530 mL
O2 Saturation: 96.6 %
PATIENT TEMPERATURE: 98.6
PEEP/CPAP: 18 cmH2O
PO2 ART: 93.1 mmHg (ref 83.0–108.0)
RATE: 35 resp/min
TCO2: 23 mmol/L (ref 0–100)
pCO2 arterial: 46.1 mmHg (ref 32.0–48.0)
pH, Arterial: 7.293 — ABNORMAL LOW (ref 7.350–7.450)

## 2015-11-18 LAB — CBC
HCT: 45.3 % (ref 39.0–52.0)
HEMOGLOBIN: 15.7 g/dL (ref 13.0–17.0)
MCH: 33.1 pg (ref 26.0–34.0)
MCHC: 34.7 g/dL (ref 30.0–36.0)
MCV: 95.6 fL (ref 78.0–100.0)
PLATELETS: 166 10*3/uL (ref 150–400)
RBC: 4.74 MIL/uL (ref 4.22–5.81)
RDW: 13.1 % (ref 11.5–15.5)
WBC: 9.3 10*3/uL (ref 4.0–10.5)

## 2015-11-18 LAB — GLUCOSE, CAPILLARY
GLUCOSE-CAPILLARY: 177 mg/dL — AB (ref 65–99)
GLUCOSE-CAPILLARY: 143 mg/dL — AB (ref 65–99)
GLUCOSE-CAPILLARY: 107 mg/dL — AB (ref 65–99)
GLUCOSE-CAPILLARY: 121 mg/dL — AB (ref 65–99)
Glucose-Capillary: 107 mg/dL — ABNORMAL HIGH (ref 65–99)
Glucose-Capillary: 112 mg/dL — ABNORMAL HIGH (ref 65–99)

## 2015-11-18 LAB — CARBOXYHEMOGLOBIN
Carboxyhemoglobin: 1.2 % (ref 0.5–1.5)
Methemoglobin: 0.7 % (ref 0.0–1.5)
O2 SAT: 78.2 %
TOTAL HEMOGLOBIN: 15.4 g/dL (ref 12.0–16.0)

## 2015-11-18 LAB — MAGNESIUM
MAGNESIUM: 1.5 mg/dL — AB (ref 1.7–2.4)
MAGNESIUM: 2.5 mg/dL — AB (ref 1.7–2.4)
Magnesium: 2.5 mg/dL — ABNORMAL HIGH (ref 1.7–2.4)
Magnesium: 2.2 mg/dL (ref 1.7–2.4)

## 2015-11-18 LAB — PHOSPHORUS
PHOSPHORUS: 2.5 mg/dL (ref 2.5–4.6)
Phosphorus: 2 mg/dL — ABNORMAL LOW (ref 2.5–4.6)
Phosphorus: 2.3 mg/dL — ABNORMAL LOW (ref 2.5–4.6)

## 2015-11-18 LAB — HEPARIN LEVEL (UNFRACTIONATED)
HEPARIN UNFRACTIONATED: 0.36 [IU]/mL (ref 0.30–0.70)
Heparin Unfractionated: 0.36 IU/mL (ref 0.30–0.70)

## 2015-11-18 LAB — TSH: TSH: 1.597 u[IU]/mL (ref 0.350–4.500)

## 2015-11-18 MED ORDER — PRO-STAT SUGAR FREE PO LIQD
30.0000 mL | Freq: Two times a day (BID) | ORAL | Status: DC
  Administered 2015-11-18 – 2015-11-19 (×3): 30 mL
  Filled 2015-11-18 (×3): qty 30

## 2015-11-18 MED ORDER — FUROSEMIDE 10 MG/ML IJ SOLN
40.0000 mg | Freq: Two times a day (BID) | INTRAMUSCULAR | Status: DC
  Administered 2015-11-18 – 2015-11-19 (×3): 40 mg via INTRAVENOUS
  Filled 2015-11-18 (×3): qty 4

## 2015-11-18 MED ORDER — MIDAZOLAM BOLUS VIA INFUSION
1.0000 mg | INTRAVENOUS | Status: DC | PRN
  Filled 2015-11-18: qty 2

## 2015-11-18 MED ORDER — FENTANYL BOLUS VIA INFUSION
50.0000 ug | INTRAVENOUS | Status: DC | PRN
  Administered 2015-11-22: 50 ug via INTRAVENOUS
  Filled 2015-11-18: qty 50

## 2015-11-18 MED ORDER — HEPARIN SODIUM (PORCINE) 5000 UNIT/ML IJ SOLN
5000.0000 [IU] | Freq: Three times a day (TID) | INTRAMUSCULAR | Status: DC
  Administered 2015-11-18 – 2015-11-20 (×6): 5000 [IU] via SUBCUTANEOUS
  Filled 2015-11-18 (×6): qty 1

## 2015-11-18 MED ORDER — DOCUSATE SODIUM 50 MG/5ML PO LIQD
100.0000 mg | Freq: Every day | ORAL | Status: DC
  Administered 2015-11-18 – 2015-11-21 (×4): 100 mg
  Filled 2015-11-18 (×4): qty 10

## 2015-11-18 MED ORDER — BISACODYL 10 MG RE SUPP: 10.0000 mg | Freq: Every day | RECTAL | Status: DC | PRN

## 2015-11-18 MED ORDER — POTASSIUM CHLORIDE 20 MEQ/15ML (10%) PO SOLN
40.0000 meq | Freq: Two times a day (BID) | ORAL | Status: DC
  Administered 2015-11-18 – 2015-11-20 (×6): 40 meq via ORAL
  Filled 2015-11-18 (×6): qty 30

## 2015-11-18 MED ORDER — SODIUM CHLORIDE 0.9 % IV SOLN
0.0000 mg/h | INTRAVENOUS | Status: DC
  Administered 2015-11-19: 1 mg/h via INTRAVENOUS
  Administered 2015-11-19: 7 mg/h via INTRAVENOUS
  Administered 2015-11-20: 2 mg/h via INTRAVENOUS
  Filled 2015-11-18 (×5): qty 10

## 2015-11-18 MED ORDER — INSULIN ASPART 100 UNIT/ML ~~LOC~~ SOLN
0.0000 [IU] | SUBCUTANEOUS | Status: DC
  Administered 2015-11-19 (×3): 1 [IU] via SUBCUTANEOUS
  Administered 2015-11-20: 2 [IU] via SUBCUTANEOUS
  Administered 2015-11-20 (×2): 1 [IU] via SUBCUTANEOUS
  Administered 2015-11-20: 2 [IU] via SUBCUTANEOUS
  Administered 2015-11-20 – 2015-11-21 (×4): 1 [IU] via SUBCUTANEOUS
  Administered 2015-11-22: 2 [IU] via SUBCUTANEOUS
  Administered 2015-11-22 – 2015-11-24 (×7): 1 [IU] via SUBCUTANEOUS

## 2015-11-18 MED ORDER — VITAL HIGH PROTEIN PO LIQD
1000.0000 mL | ORAL | Status: DC
  Administered 2015-11-18: 1000 mL

## 2015-11-18 MED ORDER — SODIUM CHLORIDE 0.9 % IV SOLN
6.0000 g | Freq: Once | INTRAVENOUS | Status: AC
  Administered 2015-11-18: 6 g via INTRAVENOUS
  Filled 2015-11-18: qty 12

## 2015-11-18 MED ORDER — DEXTROSE 5 % IV SOLN
2.0000 g | INTRAVENOUS | Status: AC
  Administered 2015-11-18 – 2015-11-24 (×7): 2 g via INTRAVENOUS
  Filled 2015-11-18 (×7): qty 2

## 2015-11-18 MED ORDER — FENTANYL CITRATE (PF) 100 MCG/2ML IJ SOLN: 50.0000 ug | Freq: Once | INTRAMUSCULAR | Status: DC

## 2015-11-18 MED ORDER — SODIUM CHLORIDE 0.9 % IV SOLN
25.0000 ug/h | INTRAVENOUS | Status: DC
  Administered 2015-11-19: 200 ug/h via INTRAVENOUS
  Administered 2015-11-20 – 2015-11-21 (×3): 175 ug/h via INTRAVENOUS
  Administered 2015-11-22: 200 ug/h via INTRAVENOUS
  Filled 2015-11-18 (×7): qty 50

## 2015-11-18 NOTE — ED Provider Notes (Signed)
[facility]-EMERGENCY DEPT Provider Note   CSN: [id number] Arrival date & time: 11/16/15  2105     History   Chief Complaint Chief Complaint  Patient presents with  . Other    Post CPR    HPI Arthur Chase is a 60 y.o. male.  HPI  No past medical history on file.  Patient Active Problem List   Diagnosis Date Noted  . Acute encephalopathy   . Cardiogenic shock (HCC)   . Cardiac arrest (HCC) 11/16/2015  . Acute respiratory failure with hypoxemia (HCC) 11/16/2015    Past Surgical History:  Procedure Laterality Date  . CARDIAC CATHETERIZATION N/A 11/16/2015   Procedure: Left Heart Cath and Coronary Angiography;  Surgeon: Peter M Swaziland, MD;  Location: [facility] INVASIVE CV LAB;  Service: Cardiovascular;  Laterality: N/A;       Home Medications    Prior to Admission medications   Medication Sig Start Date End Date Taking? Authorizing Provider  lisinopril-hydrochlorothiazide (PRINZIDE,ZESTORETIC) 20-25 MG tablet Take 1 tablet by mouth daily. 10/28/15  Yes Historical Provider, MD  Multiple Vitamin (MULTIVITAMIN WITH MINERALS) TABS tablet Take 1 tablet by mouth daily. Centrum Silver   Yes Historical Provider, MD    Family History No family history on file.  Social History Social History  Substance Use Topics  . Smoking status: Not on file  . Smokeless tobacco: Not on file  . Alcohol use Not on file     Allergies   Review of patient's allergies indicates no known allergies.   Review of Systems Review of Systems  Unable to perform ROS: Patient unresponsive     Physical Exam Updated Vital Signs BP (!) 122/58   Pulse 98   Temp 98.4 F (36.9 C) (Core (Comment)) Comment (Src): foley  Resp (!) 22   Ht 5' 9.69" (1.77 m)   Wt 116.6 kg   SpO2 96%   BMI 37.22 kg/m   Physical Exam  Constitutional: He appears well-developed and well-nourished.  HENT:  Head: Normocephalic and atraumatic.  Eyes: Conjunctivae are normal.  4mm fixed  Neck: Neck supple. No tracheal  deviation present.  Cardiovascular: Normal rate and regular rhythm.   No murmur heard. Pulmonary/Chest: Breath sounds normal. He is in respiratory distress.  Abdominal: Soft. He exhibits no distension and no mass.  Musculoskeletal: He exhibits no edema.  Neurological: A cranial nerve deficit is present.  Skin: Skin is warm and dry.  Nursing note and vitals reviewed.    ED Treatments / Results  Labs (all labs ordered are listed, but only abnormal results are displayed) Labs Reviewed  TROPONIN I - Abnormal; Notable for the following:       Result Value   Troponin I 0.96 (*)    All other components within normal limits  TROPONIN I - Abnormal; Notable for the following:    Troponin I 1.83 (*)    All other components within normal limits  TROPONIN I - Abnormal; Notable for the following:    Troponin I 1.67 (*)    All other components within normal limits  BASIC METABOLIC PANEL - Abnormal; Notable for the following:    Potassium 2.2 (*)    CO2 18 (*)    Glucose, Bld 196 (*)    Calcium 7.8 (*)    All other components within normal limits  BASIC METABOLIC PANEL - Abnormal; Notable for the following:    Potassium 2.6 (*)    CO2 17 (*)    Glucose, Bld 173 (*)  Calcium 7.7 (*)    All other components within normal limits  BASIC METABOLIC PANEL - Abnormal; Notable for the following:    Potassium 2.9 (*)    CO2 15 (*)    Glucose, Bld 191 (*)    Calcium 7.8 (*)    All other components within normal limits  GLUCOSE, CAPILLARY - Abnormal; Notable for the following:    Glucose-Capillary 176 (*)    All other components within normal limits  D-DIMER, QUANTITATIVE (NOT AT [facility]) - Abnormal; Notable for the following:    D-Dimer, Quant 8.63 (*)    All other components within normal limits  TROPONIN I - Abnormal; Notable for the following:    Troponin I 0.89 (*)    All other components within normal limits  LACTIC ACID, PLASMA - Abnormal; Notable for the following:    Lactic Acid,  Venous 3.2 (*)    All other components within normal limits  GLUCOSE, CAPILLARY - Abnormal; Notable for the following:    Glucose-Capillary 190 (*)    All other components within normal limits  BLOOD GAS, ARTERIAL - Abnormal; Notable for the following:    pH, Arterial 7.233 (*)    pO2, Arterial 49.3 (*)    Bicarbonate 16.2 (*)    Acid-base deficit 10.6 (*)    All other components within normal limits  HEPARIN LEVEL (UNFRACTIONATED) - Abnormal; Notable for the following:    Heparin Unfractionated 0.10 (*)    All other components within normal limits  GLUCOSE, CAPILLARY - Abnormal; Notable for the following:    Glucose-Capillary 210 (*)    All other components within normal limits  GLUCOSE, CAPILLARY - Abnormal; Notable for the following:    Glucose-Capillary 183 (*)    All other components within normal limits  GLUCOSE, CAPILLARY - Abnormal; Notable for the following:    Glucose-Capillary 178 (*)    All other components within normal limits  GLUCOSE, CAPILLARY - Abnormal; Notable for the following:    Glucose-Capillary 194 (*)    All other components within normal limits  GLUCOSE, CAPILLARY - Abnormal; Notable for the following:    Glucose-Capillary 207 (*)    All other components within normal limits  GLUCOSE, CAPILLARY - Abnormal; Notable for the following:    Glucose-Capillary 204 (*)    All other components within normal limits  BASIC METABOLIC PANEL - Abnormal; Notable for the following:    Potassium 2.6 (*)    CO2 16 (*)    Glucose, Bld 163 (*)    Calcium 7.6 (*)    All other components within normal limits  BASIC METABOLIC PANEL - Abnormal; Notable for the following:    Potassium 3.2 (*)    CO2 18 (*)    Glucose, Bld 175 (*)    Calcium 7.5 (*)    All other components within normal limits  BASIC METABOLIC PANEL - Abnormal; Notable for the following:    Potassium 3.3 (*)    CO2 18 (*)    Glucose, Bld 180 (*)    Calcium 7.1 (*)    All other components within normal  limits  APTT - Abnormal; Notable for the following:    aPTT 41 (*)    All other components within normal limits  HEPATIC FUNCTION PANEL - Abnormal; Notable for the following:    Total Protein 5.8 (*)    Albumin 3.3 (*)    AST 108 (*)    ALT 79 (*)    Bilirubin, Direct <0.1 (*)  All other components within normal limits  GLUCOSE, CAPILLARY - Abnormal; Notable for the following:    Glucose-Capillary 170 (*)    All other components within normal limits  GLUCOSE, CAPILLARY - Abnormal; Notable for the following:    Glucose-Capillary 158 (*)    All other components within normal limits  CARBOXYHEMOGLOBIN - Abnormal; Notable for the following:    Carboxyhemoglobin 0.4 (*)    All other components within normal limits  BASIC METABOLIC PANEL - Abnormal; Notable for the following:    Potassium 3.4 (*)    CO2 19 (*)    Glucose, Bld 162 (*)    Calcium 7.1 (*)    All other components within normal limits  GLUCOSE, CAPILLARY - Abnormal; Notable for the following:    Glucose-Capillary 154 (*)    All other components within normal limits  GLUCOSE, CAPILLARY - Abnormal; Notable for the following:    Glucose-Capillary 182 (*)    All other components within normal limits  BASIC METABOLIC PANEL - Abnormal; Notable for the following:    CO2 21 (*)    Glucose, Bld 150 (*)    Calcium 7.2 (*)    All other components within normal limits  GLUCOSE, CAPILLARY - Abnormal; Notable for the following:    Glucose-Capillary 177 (*)    All other components within normal limits  MAGNESIUM - Abnormal; Notable for the following:    Magnesium 1.5 (*)    All other components within normal limits  BLOOD GAS, ARTERIAL - Abnormal; Notable for the following:    pH, Arterial 7.293 (*)    Acid-base deficit 3.9 (*)    All other components within normal limits  GLUCOSE, CAPILLARY - Abnormal; Notable for the following:    Glucose-Capillary 176 (*)    All other components within normal limits  GLUCOSE, CAPILLARY -  Abnormal; Notable for the following:    Glucose-Capillary 185 (*)    All other components within normal limits  GLUCOSE, CAPILLARY - Abnormal; Notable for the following:    Glucose-Capillary 177 (*)    All other components within normal limits  GLUCOSE, CAPILLARY - Abnormal; Notable for the following:    Glucose-Capillary 143 (*)    All other components within normal limits  GLUCOSE, CAPILLARY - Abnormal; Notable for the following:    Glucose-Capillary 107 (*)    All other components within normal limits  BASIC METABOLIC PANEL - Abnormal; Notable for the following:    Glucose, Bld 110 (*)    Calcium 7.0 (*)    All other components within normal limits  GLUCOSE, CAPILLARY - Abnormal; Notable for the following:    Glucose-Capillary 121 (*)    All other components within normal limits  MAGNESIUM - Abnormal; Notable for the following:    Magnesium 2.5 (*)    All other components within normal limits  PHOSPHORUS - Abnormal; Notable for the following:    Phosphorus 2.0 (*)    All other components within normal limits  I-STAT CG4 LACTIC ACID, ED - Abnormal; Notable for the following:    Lactic Acid, Venous 11.85 (*)    All other components within normal limits  I-STAT CHEM 8, ED - Abnormal; Notable for the following:    Potassium 2.9 (*)    BUN 24 (*)    Creatinine, Ser 1.60 (*)    Glucose, Bld 258 (*)    All other components within normal limits  POCT I-STAT 3, ART BLOOD GAS (G3+) - Abnormal; Notable for the following:  pH, Arterial 7.192 (*)    pCO2 arterial 46.1 (*)    pO2, Arterial 54.0 (*)    Bicarbonate 18.4 (*)    Acid-base deficit 11.0 (*)    All other components within normal limits  POCT I-STAT, CHEM 8 - Abnormal; Notable for the following:    Potassium 2.3 (*)    Glucose, Bld 193 (*)    Hemoglobin 17.3 (*)    All other components within normal limits  POCT I-STAT 7, (LYTES, BLD GAS, ICA,H+H) - Abnormal; Notable for the following:    pH, Arterial 7.173 (*)    pCO2  arterial 46.3 (*)    pO2, Arterial 57.0 (*)    Bicarbonate 17.0 (*)    Acid-base deficit 11.0 (*)    Potassium 2.8 (*)    Calcium, Ion 1.09 (*)    All other components within normal limits  POCT I-STAT 7, (LYTES, BLD GAS, ICA,H+H) - Abnormal; Notable for the following:    pH, Arterial 7.198 (*)    pO2, Arterial 67.0 (*)    Bicarbonate 15.6 (*)    Acid-base deficit 12.0 (*)    Potassium 3.4 (*)    Calcium, Ion 1.08 (*)    All other components within normal limits  POCT I-STAT 3, ART BLOOD GAS (G3+) - Abnormal; Notable for the following:    pH, Arterial 7.174 (*)    pO2, Arterial 58.0 (*)    Bicarbonate 14.4 (*)    Acid-base deficit 15.0 (*)    All other components within normal limits  POCT I-STAT, CHEM 8 - Abnormal; Notable for the following:    Potassium 2.8 (*)    Glucose, Bld 167 (*)    All other components within normal limits  MRSA PCR SCREENING  CULTURE, RESPIRATORY (NON-EXPECTORATED)  PROTIME-INR  APTT  CBC  MAGNESIUM  PHOSPHORUS  BRAIN NATRIURETIC PEPTIDE  PROTIME-INR  HEPARIN LEVEL (UNFRACTIONATED)  CBC  TSH  PHOSPHORUS  CBC  HEPARIN LEVEL (UNFRACTIONATED)  CARBOXYHEMOGLOBIN  HEPARIN LEVEL (UNFRACTIONATED)  MAGNESIUM  MAGNESIUM  PHOSPHORUS  I-STAT TROPOININ, ED    EKG  EKG Interpretation  Date/Time:  Sunday November 16 2015 21:14:01 EDT Ventricular Rate:  92 PR Interval:    QRS Duration: 116 QT Interval:  338 QTC Calculation: 419 R Axis:   -52 Text Interpretation:  Sinus rhythm Nonspecific IVCD with LAD LVH with secondary repolarization abnormality No significant change since last tracing Confirmed by KNOTT MD, DANIEL ([id number]) on 11/17/2015 10:41:28 AM       Radiology Ct Head Wo Contrast  Result Date: 11/17/2015 CLINICAL DATA:  Acute encephalopathy. Cardiac arrest with clean left heart catheterization today. EXAM: CT HEAD WITHOUT CONTRAST TECHNIQUE: Contiguous axial images were obtained from the base of the skull through the vertex without  intravenous contrast. COMPARISON:  None. FINDINGS: Brain: Mild cerebral atrophy. No ventricular dilatation. No mass effect or midline shift. No abnormal extra-axial fluid collections. Gray-white matter junctions are distinct. Basal cisterns are not effaced. No evidence of acute intracranial hemorrhage. Vascular: No hyperdense vessel or unexpected calcification. Skull: No depressed skull fractures. Sinuses/Orbits: Diffuse opacification of a ethmoid air cells and sphenoid sinuses with air-fluid levels in the maxillary antra and mucosal thickening in the frontal sinuses. Mastoid air cells are not opacified. Other: None. IMPRESSION: No acute intracranial abnormalities. Fluid in the paranasal sinuses. Electronically Signed   By: Burman Nieves M.D.   On: 11/17/2015 01:47   Dg Chest Port 1 View  Result Date: 11/18/2015 CLINICAL DATA:  Central line placement. EXAM: PORTABLE  CHEST 1 VIEW COMPARISON:  11/17/2015. FINDINGS: Endotracheal tube, NG tube, Swan-Ganz catheter, left IJ line stable position. Stable cardiomegaly. Unchanged bilateral infiltrates and/or pneumonia. No pleural effusion or pneumothorax. Prior cervical spine fusion. IMPRESSION: 1.  Lines and tubes in stable position. 2. Unchanged bilateral pulmonary infiltrates and/or edema, left side greater than right. 3. Stable cardiomegaly. Electronically Signed   By: Maisie Fus  Register   On: 11/18/2015 06:55   Dg Chest Port 1 View  Result Date: 11/17/2015 CLINICAL DATA:  Swan-Ganz catheter placement. EXAM: PORTABLE CHEST 1 VIEW COMPARISON:  Yesterday. FINDINGS: Interval right jugular Swan-Ganz catheter with its tip in the intersegmental pulmonary artery on the right. No pneumothorax. Stable left jugular catheter. Endotracheal tube in satisfactory position. Nasogastric tube tip and side hole in the proximal stomach. Bilateral airspace opacity with no significant overall change. Normal sized heart. No pleural fluid seen. Cervical spine fixation hardware.  IMPRESSION: 1. Right jugular catheter tip in the intersegmental pulmonary artery on the right without pneumothorax. 2. No significant change in probable bilateral pneumonia. Electronically Signed   By: Beckie Salts M.D.   On: 11/17/2015 17:09   Dg Chest Port 1 View  Result Date: 11/17/2015 CLINICAL DATA:  Respiratory failure.  Central line placement. EXAM: PORTABLE CHEST 1 VIEW COMPARISON:  11/16/2015 at at 21:22 FINDINGS: There is a new left jugular central line with tip in the SVC at the azygos vein junction. No significant pneumothorax. Endotracheal tube remains satisfactorily positioned with its tip approximately 4.5 cm above the carina. The enteric tube extends into the stomach and beyond the inferior edge of the image. Airspace opacities persist bilaterally without significant interval change. IMPRESSION: 1. New left jugular central line appears satisfactorily positioned. No significant pneumothorax. 2. Continued satisfactory positions of the ET tube and NG tube. 3. Persistent airspace opacities bilaterally without significant interval change. Electronically Signed   By: Ellery Plunk M.D.   On: 11/17/2015 01:19   Dg Chest Portable 1 View  Result Date: 11/16/2015 CLINICAL DATA:  Respiratory failure.  Intubation. EXAM: PORTABLE CHEST 1 VIEW COMPARISON:  None. FINDINGS: The endotracheal tube is 3.3 cm above the carina. The enteric tube extends at least to the distal esophagus, continuing B on the inferior edge of the image. Diffuse airspace opacities are present bilaterally, left worse than right. No large pneumothorax or large effusion evident on this supine portable radiograph. IMPRESSION: 1. Satisfactorily positioned ETT. Enteric tube extends beyond the inferior edge of the image but reaches at least as far as the distal esophagus. 2. Diffuse airspace opacities bilaterally, left worse than right. Electronically Signed   By: Ellery Plunk M.D.   On: 11/16/2015 21:45     Procedures .Intubation Date/Time: 11/16/2015 9:05 PM Performed by: Ruthell Rummage, Niranjan Rufener Authorized by: Blane Ohara   Consent:    Consent obtained:  Emergent situation   Consent given by:  Healthcare agent Pre-procedure details:    Patient status:  Unresponsive   Mallampati score:  I   Pretreatment medications:  None   Paralytics:  Succinylcholine Procedure details:    Preoxygenation:  Bag valve mask   CPR in progress: no     Intubation method:  Oral   Oral intubation technique:  Video-assisted   Laryngoscope blade:  Mac 3   Tube size (mm):  7.5   Tube type:  Cuffed   Number of attempts:  1   Ventilation between attempts: no     Cricoid pressure: no     Tube visualized through cords: yes   Placement  assessment:    ETT to lip:  26   Tube secured with:  ETT holder   Breath sounds:  Equal   Placement verification: chest rise, condensation, CXR verification, direct visualization, equal breath sounds, ETCO2 detector and tube exhalation     CXR findings:  ETT in proper place Post-procedure details:    Patient tolerance of procedure:  Tolerated well, no immediate complications   (including critical care time)  Medications Ordered in ED Medications  0.9% sodium chloride infusion (0 mL/kg/hr  113 kg Intravenous Stopped 11/17/15 0300)  amiodarone (NEXTERONE PREMIX) 360-4.14 MG/200ML-% (1.8 mg/mL) IV infusion (60 mg/hr Intravenous New Bag/Given 11/17/15 0104)    Followed by  amiodarone (NEXTERONE PREMIX) 360-4.14 MG/200ML-% (1.8 mg/mL) IV infusion (30 mg/hr Intravenous New Bag/Given 11/18/15 0519)  norepinephrine (LEVOPHED) 16 mg in dextrose 5 % 250 mL (0.064 mg/mL) infusion (12 mcg/min Intravenous Rate/Dose Change 11/18/15 0608)  sodium bicarbonate 150 mEq in sterile water 1,000 mL infusion ( Intravenous Duplicate 11/18/15 1215)  chlorhexidine (PERIDEX) 0.12 % solution 15 mL (15 mLs Mouth Rinse Given 11/18/15 0853)  MEDLINE mouth rinse (15 mLs Mouth Rinse Given 11/18/15 1400)  milrinone  (PRIMACOR) 20 MG/100 ML (0.2 mg/mL) infusion (0.25 mcg/kg/min  113 kg Intravenous New Bag/Given 11/18/15 1121)  0.9 %  sodium chloride infusion ( Intravenous New Bag/Given 11/17/15 1639)  furosemide (LASIX) injection 40 mg (40 mg Intravenous Given 11/18/15 0956)  potassium chloride 20 MEQ/15ML (10%) solution 40 mEq (40 mEq Oral Given 11/18/15 0956)  feeding supplement (VITAL HIGH PROTEIN) liquid 1,000 mL (not administered)  feeding supplement (PRO-STAT SUGAR FREE 64) liquid 30 mL (not administered)  insulin aspart (novoLOG) injection 0-9 Units (0 Units Subcutaneous Not Given 11/18/15 1245)  heparin injection 5,000 Units (5,000 Units Subcutaneous Given 11/18/15 1343)  fentaNYL (SUBLIMAZE) injection 50 mcg (50 mcg Intravenous Not Given 11/18/15 1300)  fentaNYL (SUBLIMAZE) 2,500 mcg in sodium chloride 0.9 % 250 mL (10 mcg/mL) infusion (30 mcg/hr Intravenous Rate/Dose Change 11/18/15 1329)  fentaNYL (SUBLIMAZE) bolus via infusion 50 mcg (not administered)  midazolam (VERSED) 50 mg in sodium chloride 0.9 % 50 mL (1 mg/mL) infusion (8 mg/hr Intravenous Rate/Dose Change 11/18/15 1354)  midazolam (VERSED) bolus via infusion 1-2 mg (not administered)  docusate (COLACE) 50 MG/5ML liquid 100 mg (100 mg Per Tube Given 11/18/15 1343)  bisacodyl (DULCOLAX) suppository 10 mg (not administered)  cefTRIAXone (ROCEPHIN) 2 g in dextrose 5 % 50 mL IVPB (not administered)  propofol (DIPRIVAN) 1000 MG/100ML infusion (  Stopped 11/17/15 0130)  heparin injection 4,000 Units (4,000 Units Intravenous Given 11/16/15 2133)  aspirin suppository 300 mg (300 mg Rectal Given 11/16/15 2133)  sodium chloride 0.9 % bolus 2,000 mL (2,000 mLs Intravenous New Bag/Given 11/16/15 2135)  cisatracurium (NIMBEX) bolus via infusion 11.3 mg (11.3 mg Intravenous Bolus from Bag 11/16/15 2339)  fentaNYL (SUBLIMAZE) injection 50 mcg (50 mcg Intravenous Given 11/16/15 2342)  potassium chloride 10 mEq in 50 mL *CENTRAL LINE* IVPB (10 mEq Intravenous Given  11/17/15 0449)  magnesium sulfate IVPB 1 g 100 mL (1 g Intravenous Given 11/17/15 0519)  potassium chloride 10 mEq in 50 mL *CENTRAL LINE* IVPB (10 mEq Intravenous Given 11/17/15 1339)  potassium chloride 20 MEQ/15ML (10%) solution 40 mEq (40 mEq Per Tube Given 11/17/15 1008)  sodium bicarbonate 1 mEq/mL injection (50 mEq  Given 11/17/15 0930)  heparin bolus via infusion 2,000 Units (2,000 Units Intravenous Bolus from Bag 11/17/15 1230)  potassium chloride 10 mEq in 50 mL *CENTRAL LINE* IVPB (10 mEq Intravenous  Given 11/17/15 1602)  potassium chloride 20 MEQ/15ML (10%) solution 40 mEq (40 mEq Per Tube Given 11/17/15 2110)  magnesium sulfate 6 g in sodium chloride 0.9 % 250 mL (6 g Intravenous New Bag/Given 11/18/15 0604)     Initial Impression / Assessment and Plan / ED Course  I have reviewed the triage vital signs and the nursing notes.  Pertinent labs & imaging results that were available during my care of the patient were reviewed by me and considered in my medical decision making (see chart for details).  Clinical Course   Patient is a 60 year old male no known past medical history brought in by EMS.  History provided by EMS.  Patient was in normal state of health at home when family member noticed patient was having difficulty breathing in the adjacent room.  The family were into the room they found the patient to be unresponsive and called EMS.  Per EMS patient was agitated in route.  EMS states they arrived on scene at approximately 2015.  EMS states the defibrillator patient twice with cardioversion.  EMS gave 150 mg amiodarone 200 mcg fentanyl and 10 of Versed.  On arrival patient's O2 saturation in the 20s.  Patient was quickly intubated with sats rising to the high 80s shortly thereafter.  EKG performed by EMS showed potential STEMI however on repeat EKG in the ED, no evidence of STEMI noted.  Physical exam: Patient obtunded on arrival with pupils 4 mm and fixed.  Patient with vomitus in the  mouth.  Auscultation revealed no breath sounds while attempting to ventilate with bag valve mask.  Following intubation patient had clear and equal breath sounds bilaterally.  Patient with intact distal pulses that were weak.  No hemotympanum no septal hematoma noted.  No signs of traumatic injury.  Concern for ACS given initial EKG and history.  Patient cooling protocol initiated.  Patient found to have lactate of 12 potassium of 2.9 with slight AK I of creatinine 1.6.  Chest x-ray showed ET tube in appropriate position and diffuse air space opacities bilaterally with the left more significant than the right.  Patient emergently taken to Cath Lab.    Final Clinical Impressions(s) / ED Diagnoses   Final diagnoses:  Acute encephalopathy  Encounter for central line placement  History of ETT  Encounter for intravenous line placement  Acute respiratory failure [facility])    New Prescriptions Current Discharge Medication List       Caren Griffins, MD 11/18/15 1633    Blane Ohara, MD 11/20/15 [phone]

## 2015-11-18 NOTE — Progress Notes (Signed)
ANTICOAGULATION CONSULT NOTE - Follow-up Consult  Pharmacy Consult for heparin Indication: R/O PE (cath clean) - on hypothermia protocol  No Known Allergies  Patient Measurements: Height: 5' 9.69" (177 cm) Weight: 257 lb 1.3 oz (116.6 kg) IBW/kg (Calculated) : 72.28 Heparin Dosing Weight: estimate 92 kg   Vital Signs: Temp: 96.6 F (35.9 C) (08/29 0300) Temp Source: Core (Comment) (08/29 0300) BP: 107/66 (08/29 0000) Pulse Rate: 91 (08/29 0300)  Labs:  Recent Labs  11/17/15 0216 11/17/15 0306  11/17/15 0510 11/17/15 0606 11/17/15 0813 11/17/15 0905 11/17/15 1032 11/17/15 1600 11/17/15 1730 11/17/15 1816 11/17/15 1945 11/17/15 2353 11/18/15 0154  HGB  --  16.7  < >  --  16.3 16.7  --   --   --   --  16.2  --   --   --   HCT  --  48.5  < >  --  48.0 49.0  --   --   --   --  48.3  --   --   --   PLT  --  227  --   --   --   --   --   --   --   --  193  --   --   --   APTT 34  --   --   --   --   --   --  41*  --   --   --   --   --   --   LABPROT 14.9  --   --   --   --   --   --  15.2  --   --   --   --   --   --   INR 1.16  --   --   --   --   --   --  1.19  --   --   --   --   --   --   HEPARINUNFRC  --   --   --   --   --   --  0.10*  --   --   --  0.34  --   --  0.36  CREATININE 0.96 1.01  --  0.99  --  0.70  --  0.98 0.91  --   --  1.00 0.92  --   TROPONINI 0.96*  --   --  1.83*  --   --   --  1.67*  --  0.89*  --   --   --   --   < > = values in this interval not displayed.  Estimated Creatinine Clearance: 108.7 mL/min (by C-G formula based on SCr of 0.92 mg/dL).   Medical History: No past medical history on file.  Assessment: 60 yo M s/p cardiac arrest with CPR, was cooled via the hypothermia protocol. Pharmacy intially consulted to dose heparin for ACS/STEMI, however, cath clean. Now heparin dosing for r/o PE. Warming started early 8/28 ~2000. Pt now to 36 degrees (rewarmed) ~0200. Heparin level drawn right after pt warmed at low end of therapeutic (0.36).  Likely heparin level will go down now that pt warmed. No bleeding noted.  Goal of Therapy:  Heparin level 0.3-0.7 units/ml Monitor platelets by anticoagulation protocol: Yes   Plan:  Increase heparin drip to 1400 units/hr (to keep in mid-therapeutic range for r/o PE now that pt rewarmed) Heparin level in 6 hours  Christoper Fabian, PharmD, BCPS Clinical pharmacist, pager [phone]  11/18/2015 3:40 AM

## 2015-11-18 NOTE — Progress Notes (Signed)
Responded to page to provided counsel to pt/'s son at bedside.  Son needed guidance on  a none Healthcare  POA matter.  Son had question about how to access his father's  finance information and how to locate or get copies of known POA documents.  I provided him with county resources and other directives to pursue.  Chaplain available as needed.

## 2015-11-18 NOTE — Progress Notes (Signed)
Subjective:  Pt intubated, sedated.  Objective:  Vital Signs in the last 24 hours: Temp:  [90.1 F (32.3 C)-97.9 F (36.6 C)] 97.9 F (36.6 C) (08/29 0500) Pulse Rate:  [65-100] 100 (08/29 0500) Resp:  [0-35] 22 (08/29 0500) BP: (104-115)/(66-70) 107/66 (08/29 0000) SpO2:  [89 %-99 %] 92 % (08/29 0500) Arterial Line BP: (89-126)/(57-74) 101/57 (08/29 0500) FiO2 (%):  [100 %] 100 % (08/29 0450) Weight:  [116.6 kg (257 lb 1.3 oz)] 116.6 kg (257 lb 1.3 oz) (08/29 0240)  Intake/Output from previous day: 08/28 0701 - 08/29 0700 In: 5293.8 [I.V.:4713.8; NG/GT:130; IV Piggyback:450] Out: 1518 [Urine:1068; Emesis/NG output:450]  Physical Exam: Pt is intubated, sedated HEENT: normal Neck: JVP - unable to visualize. CVP 15 Lungs: coarse bilaterally CV: RRR with distant heart sounds Abd: soft, positive bowel sounds Ext: no edema Skin: warm/dry no rash   Lab Results:  Recent Labs  11/17/15 1816 11/18/15 0356  WBC 5.9 9.3  HGB 16.2 15.7  PLT 193 166    Recent Labs  11/17/15 2353 11/18/15 0356  NA 137 136  K 3.4* 3.8  CL 107 103  CO2 19* 21*  GLUCOSE 162* 150*  BUN 16 15  CREATININE 0.92 0.82    Recent Labs  11/17/15 1032 11/17/15 1730  TROPONINI 1.67* 0.89*    Cardiac Studies: CXR: IMPRESSION: 1.  Lines and tubes in stable position.  2. Unchanged bilateral pulmonary infiltrates and/or edema, left side greater than right.  3. Stable cardiomegaly  2D Echo: Study Conclusions  - Procedure narrative: Transthoracic echocardiography. Image   quality was suboptimal. The study was technically difficult, as a   result of poor acoustic windows, poor sound wave transmission,   chest wall deformity, and body habitus. - Left ventricle: The cavity size was normal. There was moderate   concentric hypertrophy. Systolic function was vigorous. The   estimated ejection fraction was 20%. The study is not technically   sufficient to allow evaluation of LV  diastolic function. - Left atrium: The atrium was normal in size. - Right ventricle: The cavity size was normal. Systolic function is   decreased. - Right atrium: The atrium was normal in size. - Tricuspid valve: There was mild regurgitation. - Pulmonary arteries: Not well visualized. PA peak pressure: 31 mm   Hg (S). - Pericardium, extracardiac: A trivial pericardial effusion was   identified posterior to the heart.  Impressions:  - Technically difficult study. LVEF <20%, severe global   hypokinesis, moderate LVH, bradycardia with frequent PVC&'s,   normal LA size, mild TR, rVSP 31 mmHg, trivial posterior   pericardial effusion.  Tele: Sinus rhythm, PVC burden decreased from yesterday  Assessment/Plan:  1. Acute respiratory failure/VDRF 2. Out-of-hospital cardiac arrest 3. Acute systolic heart failure with acute cardiogenic shock 4. Pulmonary edema - high FiO2/PEEP requirement this am 5. VF/nonsustained VT  Pt remains critically-ill. CXR, invasive hemodynamics, lab data reviewed. PA pressures/CVP increased - cardiac output preserved on current Rx. Recommend:  Continue IV amiodarone for rhythm management  Continue milrinone/levophed for hemodynamic support  IV diuresis with furosemide to treat pulmonary edema  Will replete K with IV diuresis  Still uncertain as to the cause of his arrest. Normal coronary arteries noted at cath. LV function appeared completely normal by ventriculography, but severely depressed at echo yesterday. Possible that critical illness/frequent ventricular ectopy during echo study responsible for change in LV function. PE is still in the differential and the patient has been continued on IV heparin. Echo  did not show evidence of pulmonary HTN or dilated RV - so no compelling evidence for massive PE. Might be reasonable to continue IV heparin in absence of bleeding problems until he is more stable and can be assessed further, but will defer to CCM team on  this.   The patient is critically ill with multiple organ systems failure and requires high complexity decision making for assessment and support, frequent evaluation and titration of therapies, application of advanced monitoring technologies and extensive interpretation of multiple databases.   Critical Care Time devoted to patient care services described in this note is 40 minutes.  Tonny Bollmanooper, Iva Posten, M.D. 11/18/2015, 8:34 AM

## 2015-11-18 NOTE — Progress Notes (Signed)
[facility] ADULT ICU REPLACEMENT PROTOCOL FOR AM LAB REPLACEMENT ONLY  The patient does apply for the [facility] Adult ICU Electrolyte Replacment Protocol based on the criteria listed below:   1. Is GFR >/= 40 ml/min? Yes.    Patient's GFR today is >60 2. Is urine output >/= 0.5 ml/kg/hr for the last 6 hours? Yes.   Patient's UOP is 0.5 ml/kg/hr 3. Is BUN < 60 mg/dL? Yes.    Patient's BUN today is 15 4. Abnormal electrolyte  Mg 1.5 5. Ordered repletion with: per protocol 6. If a panic level lab has been reported, has the CCM MD in charge been notified? Yes.  .   Physician:  Tonny Branch, Lang Snow 11/18/2015 4:50 AM

## 2015-11-18 NOTE — Progress Notes (Signed)
PULMONARY / CRITICAL CARE MEDICINE   Name: Arthur Chase MRN: [id number] DOB: 05/25/1955    ADMISSION DATE:  11/16/2015 CONSULTATION DATE:  11/16/2015  REFERRING MD:  Dr. Peter Swaziland  CHIEF COMPLAINT:  Cardiac arrest  HISTORY OF PRESENT ILLNESS:   60 year old male with obesity and hypertension was brought to the [facility] emergency department on 11/16/2015 after a cardiac arrest at home. His children provide history as he was comatose on our evaluation. They state that he had been in his usual state of health but on 11/16/2015 they could hear him breathing heavily from another room. They came immediately found him to be unresponsive. They called 911 and started CPR. The fire department arrived nearly immediately and her vital shock via AED and CPR. Paramedics arrived and found him to be in V. fib. Total time of CPR is estimated to be approximately 16 minutes. En route he had V. fib on 2 other occasions which were successfully defibrillated. In the emergency department he was noted to be agitated while intubated. He was taken to the Cath Lab. In the Cath Lab he was noted to have clean coronary arteries and a normal left ventricular ejection fraction. Left ventricular end-diastolic pressure was 18.  SUBJECTIVE:  Rewarmed to 36 per cardiology for cardiogenic shock, milrinone started nimbex off around 0330.   Shivering post re-warming.    VITAL SIGNS: BP (!) 111/58   Pulse 92   Temp (!) 96.6 F (35.9 C) (Core (Comment)) Comment (Src): foley  Resp 13   Ht 5' 9.69" (1.77 m)   Wt 257 lb 1.3 oz (116.6 kg)   SpO2 94%   BMI 37.22 kg/m   HEMODYNAMICS: PAP: (33-47)/(20-32) 44/24 CVP:  [12 mmHg-16 mmHg] 12 mmHg PCWP:  [13 mmHg-20 mmHg] 13 mmHg CO:  [3.7 L/min-5.6 L/min] 5.6 L/min CI:  [1.6 L/min/m2-2.4 L/min/m2] 2.4 L/min/m2  VENTILATOR SETTINGS: Vent Mode: PRVC FiO2 (%):  [80 %-100 %] 80 % Set Rate:  [35 bmp] 35 bmp Vt Set:  [530 mL-550 mL] 550 mL PEEP:  [18 cmH20] 18 cmH20 Plateau  Pressure:  [21 cmH20-36 cmH20] 34 cmH20  INTAKE / OUTPUT: I/O last 3 completed shifts: In: 7003.1 [I.V.:5923.1; NG/GT:130; IV Piggyback:950] Out: 8242 [PNTIR:4431; Emesis/NG output:450]  PHYSICAL EXAMINATION: General:  Obese male in NAD on ventilator. Neuro: sedate HEENT:  Normocephalic atraumatic, endotracheal tube in place Cardiovascular:  Regular rate and rhythm, no murmurs gallops or rubs, 2+ radial pulses Lungs:  Few crackles bilaterally, vent supported breaths, non-labored Abdomen:  Obese abdomen, nontender nondistended Musculoskeletal:  Normal bulk and tone Skin:  Cool to the touch, refill intact   LABS:  BMET  Recent Labs Lab 11/17/15 2353 11/18/15 0356 11/18/15 0934  NA 137 136 137  K 3.4* 3.8 4.1  CL 107 103 106  CO2 19* 21* 23  BUN 16 15 12   CREATININE 0.92 0.82 0.75  GLUCOSE 162* 150* 110*   Electrolytes  Recent Labs Lab 11/17/15 0306  11/17/15 2353 11/18/15 0356 11/18/15 0934  CALCIUM 7.7*  < > 7.1* 7.2* 7.0*  MG 1.8  --   --  1.5*  --   PHOS 3.0  --   --  2.5  --   < > = values in this interval not displayed. CBC  Recent Labs Lab 11/17/15 0306  11/17/15 0813 11/17/15 1816 11/18/15 0356  WBC 7.7  --   --  5.9 9.3  HGB 16.7  < > 16.7 16.2 15.7  HCT 48.5  < > 49.0 48.3  45.3  PLT 227  --   --  193 166  < > = values in this interval not displayed. Coag's  Recent Labs Lab 11/17/15 0216 11/17/15 1032  APTT 34 41*  INR 1.16 1.19   Sepsis Markers  Recent Labs Lab 11/16/15 2143 11/17/15 0218  LATICACIDVEN 11.85* 3.2*   ABG  Recent Labs Lab 11/17/15 0606 11/17/15 0819 11/18/15 0420  PHART 7.198* 7.174* 7.293*  PCO2ART 40.1 37.0 46.1  PO2ART 67.0* 58.0* 93.1    Liver Enzymes  Recent Labs Lab 11/17/15 1032  AST 108*  ALT 79*  ALKPHOS 44  BILITOT 0.5  ALBUMIN 3.3*    Cardiac Enzymes  Recent Labs Lab 11/17/15 0510 11/17/15 1032 11/17/15 1730  TROPONINI 1.83* 1.67* 0.89*   Glucose  Recent Labs Lab  11/17/15 1813 11/17/15 1940 11/17/15 2123 11/18/15 0334 11/18/15 0800 11/18/15 1202  GLUCAP 176* 185* 177* 143* 107* 121*   Imaging Dg Chest Port 1 View  Result Date: 11/18/2015 CLINICAL DATA:  Central line placement. EXAM: PORTABLE CHEST 1 VIEW COMPARISON:  11/17/2015. FINDINGS: Endotracheal tube, NG tube, Swan-Ganz catheter, left IJ line stable position. Stable cardiomegaly. Unchanged bilateral infiltrates and/or pneumonia. No pleural effusion or pneumothorax. Prior cervical spine fusion. IMPRESSION: 1.  Lines and tubes in stable position. 2. Unchanged bilateral pulmonary infiltrates and/or edema, left side greater than right. 3. Stable cardiomegaly. Electronically Signed   By: Maisie Fushomas  Register   On: 11/18/2015 06:55   Dg Chest Port 1 View  Result Date: 11/17/2015 CLINICAL DATA:  Swan-Ganz catheter placement. EXAM: PORTABLE CHEST 1 VIEW COMPARISON:  Yesterday. FINDINGS: Interval right jugular Swan-Ganz catheter with its tip in the intersegmental pulmonary artery on the right. No pneumothorax. Stable left jugular catheter. Endotracheal tube in satisfactory position. Nasogastric tube tip and side hole in the proximal stomach. Bilateral airspace opacity with no significant overall change. Normal sized heart. No pleural fluid seen. Cervical spine fixation hardware. IMPRESSION: 1. Right jugular catheter tip in the intersegmental pulmonary artery on the right without pneumothorax. 2. No significant change in probable bilateral pneumonia. Electronically Signed   By: Beckie SaltsSteven  Reid M.D.   On: 11/17/2015 17:09   STUDIES:  8/27  LHC >> clean coronary arteries, normal LVEDP, normal LV function 8/28  CT head >> no acute intracranial abnormalites; fluid in the paranasal sinuses  8/28  TTE >> suboptimal study, LVEF <20%, severe global hypokinesis, mod LVH, bradycardia with PVCs, normal LA size, mild TR, PAP 31mmHg, trivial posterior pericardial effusion  CULTURES: 8/27 Sputum >>   ANTIBIOTICS: Unasyn  8/27>> 8/28 Rocephin 8/28 >>  SIGNIFICANT EVENTS: 8/27 Admit with VF arrest 8/28  Swan placed to 57 cm, PA pressure 28/13.  Rewarmed to normothermia   LINES/TUBES: ETT 8/27 >> L IJ TLC 8/27 >>  R IJ Cordis + Ernestine ConradSwan 8/28 >>   DISCUSSION: 60 year old male with a past medical history significant for obesity and hypertension was admitted 8/27 with a VF cardiac arrest. No evidence of coronary artery disease based on left heart catheterization. Later found to have LVEF of <20% on TTE (suboptimal study).  Differential diagnosis of his cardiac arrest includes a primary cardiac arrhythmia (most likely) versus less likely an acute CNS event or a pulmonary embolism.  ASSESSMENT / PLAN:  PULMONARY A: Acute Hypoxic Respiratory Failure  Possible Aspiration Pneumonia Rule Out Acute Pulmonary Embolism ARDS P:   PRVC 8cc/kg, R 35  PRN ABG Trend CXR  Wean PEEP / FiO2 for sats >92% See ID Lasix as below VAP prevention  measures  CARDIOVASCULAR A:  V. fib Arrest Cardiogenic Shock  P:  Consider repeat ECHO to reassess LV function, defer timing to Cards Amiodarone / Milrinone per Cardiology  Lasix 40 mg IV x2 on 8/29 Levophed for MAP > 65   RENAL A:   Hypokalemia Metabolic Acidosis  Hypomagnesemia  P:   Monitor BMET and UOP Replace electrolytes as needed Bicarb gtt @ 28ml/hr  GASTROINTESTINAL A:   At Risk Protein Calorie Malnutrition  P:   OG tube TF per nutrition  Pepcid for stress ulcer prophylaxis Colace + bisacodyl for bowel regimen   HEMATOLOGIC A:   Low suspicion for PE P:  Monitor for bleeding D/C heparin gtt  SCD's + SQ heparin for DVT prophylaxis   INFECTIOUS A:   Possible aspiration pneumonia P:   Follow cultures Continue rocephin    ENDOCRINE A:   Hyperglycemia  P:   Monitor glucose Add SSI, sensitive scale  NEUROLOGIC A:   Cardiac arrest, at risk for anoxic brain injury Hx Back Surgery with Implanted Stimulator P:   RASS goal:  -2 to  -3 Continue fentanyl gtt for pain  Versed gtt for sedation  Defer WUA 8/29 D/C cooling / normothermia protocol   FAMILY  - Updates: Family updated per Dr. Molli Knock at bedside.   - Inter-disciplinary family meet or Palliative Care meeting due by:  Day 7  [name], NP-C [facility] Pgr: [phone] or if no answer [phone] 11/18/2015, 12:22 PM  Attending Note:  60 year old male s/p cardiac arrest with a clean cath.  PA pressure and echo do not support PE, this a combination of pulmonary edema and ARDS causing this O2 demand.  PAC number noted.  Stopped hypothermia and pH as well as hemodynamics have improved.  Will begin gentle diureses and titrate FiO2 down as able then PEEP gently.  Brother updated bedside.  The patient is critically ill with multiple organ systems failure and requires high complexity decision making for assessment and support, frequent evaluation and titration of therapies, application of advanced monitoring technologies and extensive interpretation of multiple databases.   Critical Care Time devoted to patient care services described in this note is  35  Minutes. This time reflects time of care of this signee Dr Koren Bound. This critical care time does not reflect procedure time, or teaching time or supervisory time of PA/NP/Med student/Med Resident etc but could involve care discussion time.  Alyson Reedy, M.D. [facility] Pulmonary/Critical Care Medicine. Pager: [phone]. After hours pager: [phone].

## 2015-11-19 ENCOUNTER — Inpatient Hospital Stay (HOSPITAL_COMMUNITY): Payer: Managed Care, Other (non HMO)

## 2015-11-19 ENCOUNTER — Other Ambulatory Visit (HOSPITAL_COMMUNITY): Payer: Managed Care, Other (non HMO)

## 2015-11-19 LAB — CBC
HCT: 38.1 % — ABNORMAL LOW (ref 39.0–52.0)
HEMOGLOBIN: 12.5 g/dL — AB (ref 13.0–17.0)
MCH: 32.1 pg (ref 26.0–34.0)
MCHC: 32.8 g/dL (ref 30.0–36.0)
MCV: 97.7 fL (ref 78.0–100.0)
PLATELETS: 103 10*3/uL — AB (ref 150–400)
RBC: 3.9 MIL/uL — AB (ref 4.22–5.81)
RDW: 13.8 % (ref 11.5–15.5)
WBC: 9.7 10*3/uL (ref 4.0–10.5)

## 2015-11-19 LAB — GLUCOSE, CAPILLARY
GLUCOSE-CAPILLARY: 122 mg/dL — AB (ref 65–99)
GLUCOSE-CAPILLARY: 113 mg/dL — AB (ref 65–99)
GLUCOSE-CAPILLARY: 133 mg/dL — AB (ref 65–99)
Glucose-Capillary: 105 mg/dL — ABNORMAL HIGH (ref 65–99)
Glucose-Capillary: 108 mg/dL — ABNORMAL HIGH (ref 65–99)
Glucose-Capillary: 110 mg/dL — ABNORMAL HIGH (ref 65–99)
Glucose-Capillary: 120 mg/dL — ABNORMAL HIGH (ref 65–99)
Glucose-Capillary: 122 mg/dL — ABNORMAL HIGH (ref 65–99)

## 2015-11-19 LAB — COMPREHENSIVE METABOLIC PANEL
ALBUMIN: 2.5 g/dL — AB (ref 3.5–5.0)
ALT: 43 U/L (ref 17–63)
ANION GAP: 6 (ref 5–15)
AST: 45 U/L — AB (ref 15–41)
Alkaline Phosphatase: 40 U/L (ref 38–126)
BUN: 17 mg/dL (ref 6–20)
CHLORIDE: 101 mmol/L (ref 101–111)
CO2: 29 mmol/L (ref 22–32)
Calcium: 7 mg/dL — ABNORMAL LOW (ref 8.9–10.3)
Creatinine, Ser: 0.88 mg/dL (ref 0.61–1.24)
GFR calc Af Amer: >60 mL/min (ref >60)
GFR calc non Af Amer: >60 mL/min (ref >60)
GLUCOSE: 130 mg/dL — AB (ref 65–99)
POTASSIUM: 4 mmol/L (ref 3.5–5.1)
SODIUM: 136 mmol/L (ref 135–145)
Total Bilirubin: 0.4 mg/dL (ref 0.3–1.2)
Total Protein: 4.9 g/dL — ABNORMAL LOW (ref 6.5–8.1)

## 2015-11-19 LAB — BLOOD GAS, ARTERIAL
ACID-BASE EXCESS: 5.2 mmol/L — AB (ref 0.0–2.0)
Bicarbonate: 30.3 mmol/L — ABNORMAL HIGH (ref 20.0–28.0)
FIO2: 75
MECHVT: 530 mL
O2 SAT: 97.7 %
PATIENT TEMPERATURE: 98.6
PCO2 ART: 53.5 mmHg — AB (ref 32.0–48.0)
PEEP: 18 cmH2O
PH ART: 7.371 (ref 7.350–7.450)
PO2 ART: 100 mmHg (ref 83.0–108.0)
RATE: 35 resp/min

## 2015-11-19 LAB — MAGNESIUM
MAGNESIUM: 2 mg/dL (ref 1.7–2.4)
MAGNESIUM: 1.9 mg/dL (ref 1.7–2.4)

## 2015-11-19 LAB — CULTURE, RESPIRATORY

## 2015-11-19 LAB — PHOSPHORUS
PHOSPHORUS: 2 mg/dL — AB (ref 2.5–4.6)
PHOSPHORUS: 1.8 mg/dL — AB (ref 2.5–4.6)

## 2015-11-19 MED ORDER — ADULT MULTIVITAMIN LIQUID CH
15.0000 mL | Freq: Every day | ORAL | Status: DC
  Administered 2015-11-19 – 2015-11-23 (×5): 15 mL
  Filled 2015-11-19 (×7): qty 15

## 2015-11-19 MED ORDER — VITAL HIGH PROTEIN PO LIQD
1000.0000 mL | ORAL | Status: DC
  Administered 2015-11-19 – 2015-11-23 (×5): 1000 mL

## 2015-11-19 MED ORDER — METOLAZONE 5 MG PO TABS
5.0000 mg | ORAL_TABLET | Freq: Every day | ORAL | Status: AC
  Administered 2015-11-19: 5 mg via ORAL
  Filled 2015-11-19: qty 1

## 2015-11-19 MED ORDER — FUROSEMIDE 10 MG/ML IJ SOLN
40.0000 mg | Freq: Once | INTRAMUSCULAR | Status: AC
  Administered 2015-11-19: 40 mg via INTRAVENOUS
  Filled 2015-11-19: qty 4

## 2015-11-19 MED ORDER — FUROSEMIDE 10 MG/ML IJ SOLN
80.0000 mg | Freq: Three times a day (TID) | INTRAMUSCULAR | Status: DC
  Administered 2015-11-19 – 2015-11-22 (×9): 80 mg via INTRAVENOUS
  Filled 2015-11-19 (×10): qty 8

## 2015-11-19 MED ORDER — AMIODARONE HCL IN DEXTROSE 360-4.14 MG/200ML-% IV SOLN
30.0000 mg/h | INTRAVENOUS | Status: DC
  Administered 2015-11-19 – 2015-11-23 (×18): 60 mg/h via INTRAVENOUS
  Administered 2015-11-23: 30 mg/h via INTRAVENOUS
  Administered 2015-11-23: 60 mg/h via INTRAVENOUS
  Administered 2015-11-24 (×2): 30 mg/h via INTRAVENOUS
  Filled 2015-11-19 (×19): qty 200
  Filled 2015-11-19: qty 400
  Filled 2015-11-19: qty 200

## 2015-11-19 MED ORDER — PRO-STAT SUGAR FREE PO LIQD
60.0000 mL | Freq: Two times a day (BID) | ORAL | Status: DC
  Administered 2015-11-19 – 2015-11-23 (×9): 60 mL
  Filled 2015-11-19 (×10): qty 60

## 2015-11-19 MED ORDER — AMIODARONE LOAD VIA INFUSION
150.0000 mg | Freq: Once | INTRAVENOUS | Status: AC
  Administered 2015-11-19: 150 mg via INTRAVENOUS
  Filled 2015-11-19: qty 83.34

## 2015-11-19 MED ORDER — AMIODARONE IV BOLUS ONLY 150 MG/100ML
150.0000 mg | Freq: Once | INTRAVENOUS | Status: AC
  Administered 2015-11-19: 150 mg via INTRAVENOUS

## 2015-11-19 NOTE — Progress Notes (Signed)
EEG Completed; Results Pending  

## 2015-11-19 NOTE — Progress Notes (Signed)
Pt in Afib RVR, 130-140's sustaining. Dr Excell Seltzer paged and Amiodarone drip restarted.

## 2015-11-19 NOTE — Consult Note (Addendum)
Neurology Consult Note  Reason for Consultation: Neurologic prognosis after cardiac arrest  Requesting provider: Koren BoundWesam Yacoub, MD  CC: Patient is intubated and comatose, unable to provide any history  HPI: This is a 60-yo man admitted to [facility] after an out-of-hospital cardiac arrest. History is obtained from the review of the patient's medical record as he is comatose and unable to provide any information.   The patient was apparently in his normal state of health until 11/16/15. His children report that they were at home when they heard him breathing heavily from another room. When they checked on him, he was noted to be unresponsive. They called 911 and initiated CPR. First responders were the local fire department who arrived very quickly. They apparently had to administer shock via AED and continued CPR. EMS arrived and noted ventricular fibrillation. ACLS was initiated and after about 16 minutes he developed return of spontaneous circulation. During transport to the emergency department, he developed 2 episodes of ventricular fibrillation requiring shocks. He was intubated on arrival in the emergency department. The hypothermia protocol was initiated and he was taken for emergent cardiac catheterization which demonstrated no occlusive disease in the coronary arteries with normal left ventricular ejection fraction. He was admitted to the ICU for further management where the hypothermia protocol was completed. Sustained normothermia with temperature 90.7 degrees and above was reached at 1201 on 11/18/15. He has remained unresponsive and neurology consultation is now requested for further prognostication.  PMH: Limited to chart review as patient comatose and unable to provide.  No past medical history on file.  PSH: Limited to chart review as patient comatose and unable to provide.  Past Surgical History:  Procedure Laterality Date  . CARDIAC CATHETERIZATION N/A 11/16/2015   Procedure: Left Heart Cath and Coronary Angiography;  Surgeon: Peter M SwazilandJordan, MD;  [facility];  Service: Cardiovascular;  Laterality: N/A;    Family history: Limited to chart review as patient comatose and unable to provide.  No family history on file.  Social history: Limited to chart review as patient comatose and unable to provide.  Social History   Social History  . Marital status: Married    Spouse name: N/A  . Number of children: N/A  . Years of education: N/A   Occupational History  . Not on file.   Social History Main Topics  . Smoking status: Not on file  . Smokeless tobacco: Not on file  . Alcohol use Not on file  . Drug use: Unknown  . Sexual activity: Not on file   Other Topics Concern  . Not on file   Social History Narrative  . No narrative on file    Current outpatient meds: Limited to chart review as patient comatose and unable to provide.  Current Meds  Medication Sig  . lisinopril-hydrochlorothiazide (PRINZIDE,ZESTORETIC) 20-25 MG tablet Take 1 tablet by mouth daily.  . Multiple Vitamin (MULTIVITAMIN WITH MINERALS) TABS tablet Take 1 tablet by mouth daily. Centrum Silver    Current inpatient meds:   Current Facility-Administered Medications  Medication Dose Route Frequency Provider Last Rate Last Dose  . 0.9 %  sodium chloride infusion   Intravenous Continuous Alyson ReedyWesam G Yacoub, MD 10 mL/hr at 11/19/15 0800    . amiodarone (NEXTERONE PREMIX) 360-4.14 MG/200ML-% (1.8 mg/mL) IV infusion  60 mg/hr Intravenous Continuous Tonny BollmanMichael Cooper, MD 33.3 mL/hr at 11/19/15 1311 60 mg/hr at 11/19/15 1311  . bisacodyl (DULCOLAX) suppository 10 mg  10 mg Rectal Daily PRN  Jeanella Craze, NP      . cefTRIAXone (ROCEPHIN) 2 g in dextrose 5 % 50 mL IVPB  2 g Intravenous Q24H Lupita Leash, MD   2 g at 11/18/15 1834  . chlorhexidine (PERIDEX) 0.12 % solution 15 mL  15 mL Mouth Rinse BID Lupita Leash, MD   15 mL at 11/19/15 0824  . docusate (COLACE)  50 MG/5ML liquid 100 mg  100 mg Per Tube Daily Jeanella Craze, NP   100 mg at 11/19/15 1057  . feeding supplement (PRO-STAT SUGAR FREE 64) liquid 60 mL  60 mL Per Tube BID Alyson Reedy, MD      . feeding supplement (VITAL HIGH PROTEIN) liquid 1,000 mL  1,000 mL Per Tube Q24H Alyson Reedy, MD   1,000 mL at 11/19/15 1323  . fentaNYL (SUBLIMAZE) 2,500 mcg in sodium chloride 0.9 % 250 mL (10 mcg/mL) infusion  25-400 mcg/hr Intravenous Continuous Jeanella Craze, NP 15 mL/hr at 11/19/15 1140 150 mcg/hr at 11/19/15 1140  . fentaNYL (SUBLIMAZE) bolus via infusion 50 mcg  50 mcg Intravenous Q1H PRN Jeanella Craze, NP      . fentaNYL (SUBLIMAZE) injection 50 mcg  50 mcg Intravenous Once Jeanella Craze, NP      . furosemide (LASIX) injection 80 mg  80 mg Intravenous Q8H Tonny Bollman, MD      . heparin injection 5,000 Units  5,000 Units Subcutaneous Q8H Jeanella Craze, NP   5,000 Units at 11/19/15 1323  . insulin aspart (novoLOG) injection 0-9 Units  0-9 Units Subcutaneous Q4H Jeanella Craze, NP   1 Units at 11/19/15 0824  . MEDLINE mouth rinse  15 mL Mouth Rinse 10 times per day Lupita Leash, MD   15 mL at 11/19/15 1200  . midazolam (VERSED) 50 mg in sodium chloride 0.9 % 50 mL (1 mg/mL) infusion  0-10 mg/hr Intravenous Continuous Jeanella Craze, NP 1 mL/hr at 11/19/15 1140 1 mg/hr at 11/19/15 1140  . midazolam (VERSED) bolus via infusion 1-2 mg  1-2 mg Intravenous Q2H PRN Jeanella Craze, NP      . milrinone (PRIMACOR) 20 MG/100 ML (0.2 mg/mL) infusion  0.25 mcg/kg/min Intravenous Continuous Alyson Reedy, MD 8.5 mL/hr at 11/19/15 0800 0.25 mcg/kg/min at 11/19/15 0800  . multivitamin liquid 15 mL  15 mL Per Tube Daily Alyson Reedy, MD   15 mL at 11/19/15 1323  . norepinephrine (LEVOPHED) 16 mg in dextrose 5 % 250 mL (0.064 mg/mL) infusion  2-100 mcg/min Intravenous Continuous Lupita Leash, MD 2.8 mL/hr at 11/19/15 1100 3 mcg/min at 11/19/15 1100  . potassium chloride 20 MEQ/15ML (10%) solution  40 mEq  40 mEq Oral BID Tonny Bollman, MD   40 mEq at 11/19/15 1057  . sodium bicarbonate 150 mEq in sterile water 1,000 mL infusion   Intravenous Continuous Karl Ito, MD   Stopped at 11/19/15 1140    Allergies: No Known Allergies  ROS: As per HPI. This cannot be obtained as the patient is comatose and unable to provide.  PE:  BP 137/62   Pulse 97   Temp 98.1 F (36.7 C)   Resp (!) 24   Ht 5\' 9"  (1.753 m)   Wt 122.7 kg (270 lb 8.1 oz)   SpO2 98%   BMI 39.95 kg/m   General: WDWN lying in ICU bed. He is intubated. He is currently sedated with fentanyl and Versed infusions. He has no  spontaneous eye opening. He does not respond to verbal, tactile, or noxious stimulation. EEG is underway. He was observed to spontaneously lift his right arm off the bed in a nonpurposeful manner. HEENT: Normocephalic. Neck supple without LAD. ETT and OGT are in place. Sclerae anicteric. Mild conjunctival injection.  CV: Regular, no murmur. Carotid pulses full and symmetric, no bruits.  Lungs: On a ventilator. Occasional crackles noted bilaterally on anterior exam.  Abdomen: Soft, obese non-distended, no rebound or guarding. Bowel sounds hypoactive.  Extremities: No C/C/E. Neuro:  CN: Pupils are equal and reactive from 3-2 mm. Eyes are mildly dysconjugate. He does not blink to visual threat. Oculocephalics are sluggish. Corneals are present bilaterally. His face is grossly symmetric but partly obscured by tubes and tape. He has no response to supraorbital pressure. Cough is intact. Remainder of cranial nerves cannot be accurately assessed as he is unable to cooperate with the examination.  Motor: Normal bulk. Tone is reduced throughout. He was observed to spontaneously lift right arm off the bed suggesting at least 3 out of 5 strength in that extremity. He appears to have some occasional shivering.  Sensation: He has slight flexion to nailbed pressure in the arms, no response no blood pressure in the  legs.  DTRs: 2+, symmetric. Toes downgoing bilaterally. No pathologic reflexes.  Coordination and gait: This cannot be tested as the patient is comatose and unable to participate with the examination.  Labs:  Lab Results  Component Value Date   WBC 9.7 11/19/2015   HGB 12.5 (L) 11/19/2015   HCT 38.1 (L) 11/19/2015   PLT 103 (L) 11/19/2015   GLUCOSE 130 (H) 11/19/2015   ALT 43 11/19/2015   AST 45 (H) 11/19/2015   NA 136 11/19/2015   K 4.0 11/19/2015   CL 101 11/19/2015   CREATININE 0.88 11/19/2015   BUN 17 11/19/2015   CO2 29 11/19/2015   TSH 1.597 11/18/2015   INR 1.19 11/17/2015   Phosphorus 2.0 Magnesium 2.0 TSH 1.597  Imaging:  I have personally and independently reviewed the CT scan of the head without contrast from 11/17/15. This shows no obvious acute abnormality.  Other diagnostic studies:  EEG from 11/19/15 showed significant muscle artifact. The visible background demonstrates low voltages with severe diffuse generalized slowing in the delta range. No seizures.  Assessment and Plan:  1. Anoxic brain injury: This is acute, due to out of hospital cardiac arrest with first identified rhythm ventricular fibrillation. Total downtime is unknown with documented resuscitation time after EMS arrived of 16 minutes. He has now day #1 after completing the therapeutic hypothermia protocol, as he reached normothermia at approximately noon on 11/18/15. Treatment is supportive, as noted below.  2. Anoxic encephalopathy: This is acute, due to anoxic brain injury from cardiac arrest. The current examination shows intact brainstem reflexes with weak flexion withdrawal to pain. He was noted to have some spontaneous movement of the right upper extremity that appeared to be nonpurposeful. Continue with supportive care. Avoid hypoxemia and hypotension for even brief intervals as these are associated with worse neurologic outcomes. Fever and hyperglycemia must be aggressively treated for the same  reason. CTH canceled for today, will consider checking in a couple days to allow time for anoxic changes to fully develop. We will continue to follow the exam to aid with neurologic prognosis.   This was discussed with the patient's brother at the bedside. He was updated on the patient's neurologic status and the plans moving forward as detailed above. He  is given the opportunity to ask any questions and these were addressed to his satisfaction.  Thank you for the opportunity to participate in this patient's care. I will continue to follow with you. Please feel free to call with any questions or concerns.  This patient is critically ill and at significant risk of neurological worsening, death and care requires constant monitoring of vital signs, hemodynamics,respiratory and cardiac monitoring, neurological assessment, discussion with family, other specialists and medical decision making of high complexity. A total of 65 minutes of critical care time was spent on this case.

## 2015-11-19 NOTE — Progress Notes (Signed)
PULMONARY / CRITICAL CARE MEDICINE   Name: Arthur Chase MRN: [id number] DOB: [birth year]    ADMISSION DATE:  11/16/2015 CONSULTATION DATE:  11/16/2015  REFERRING MD:  Dr. Peter Swaziland  CHIEF COMPLAINT:  Cardiac arrest  HISTORY OF PRESENT ILLNESS:   60 year old male with obesity and hypertension was [facility] emergency department on 11/16/2015 after a cardiac arrest at home. His children provide history as he was comatose on our evaluation. They state that he had been in his usual state of health but on 11/16/2015 they could hear him breathing heavily from another room. They came immediately found him to be unresponsive. They called 911 and started CPR. The fire department arrived nearly immediately and her vital shock via AED and CPR. Paramedics arrived and found him to be in V. fib. Total time of CPR is estimated to be approximately 16 minutes. En route he had V. fib on 2 other occasions which were successfully defibrillated. In the emergency department he was noted to be agitated while intubated. He was taken to the Cath Lab. In the Cath Lab he was noted to have clean coronary arteries and a normal left ventricular ejection fraction. Left ventricular end-diastolic pressure was 18.  SUBJECTIVE:  A-fib overnight. No wake up assessment.  VITAL SIGNS: BP 137/62   Pulse 97   Temp 98.1 F (36.7 C)   Resp (!) 24   Ht 5\' 9"  (1.753 m)   Wt 122.7 kg (270 lb 8.1 oz)   SpO2 98%   BMI 39.95 kg/m   HEMODYNAMICS: PAP: (39-58)/(24-41) 52/33 CVP:  [13 mmHg-20 mmHg] 18 mmHg PCWP:  [24 mmHg-26 mmHg] 24 mmHg CO:  [7.9 L/min-8 L/min] 7.9 L/min CI:  [3.4 L/min/m2-3.5 L/min/m2] 3.4 L/min/m2  VENTILATOR SETTINGS: Vent Mode: PRVC FiO2 (%):  [60 %-80 %] 60 % Set Rate:  [35 bmp] 35 bmp Vt Set:  [530 mL-550 mL] 530 mL PEEP:  [10 cmH20-18 cmH20] 14 cmH20 Plateau Pressure:  [27 cmH20-38 cmH20] 35 cmH20  INTAKE / OUTPUT: I/O last 3 completed shifts: In: 7888.7 [I.V.:6999.4;  NG/GT:689.3; IV Piggyback:200] Out: 3565 [Urine:3565]  PHYSICAL EXAMINATION: General:  Obese male in NAD on ventilator. Neuro: Sedated but withdraws to pain. HEENT:  Normocephalic atraumatic, endotracheal tube in place. Cardiovascular:  Regular rate and rhythm, no murmurs gallops or rubs, 2+ radial pulses Lungs:  Few crackles bilaterally, vent supported breaths, non-labored Abdomen:  Obese abdomen, nontender nondistended Musculoskeletal:  Normal bulk and tone Skin:  Cool to the touch, refill intact  LABS:  BMET  Recent Labs Lab 11/18/15 0356 11/18/15 0934 11/19/15 0344  NA 136 137 136  K 3.8 4.1 4.0  CL 103 106 101  CO2 21* 23 29  BUN 15 12 17   CREATININE 0.82 0.75 0.88  GLUCOSE 150* 110* 130*   Electrolytes  Recent Labs Lab 11/18/15 0356 11/18/15 0934 11/18/15 1332 11/18/15 1813 11/18/15 1956 11/19/15 0344  CALCIUM 7.2* 7.0*  --   --   --  7.0*  MG 1.5*  --  2.5* 2.5* 2.2 2.0  PHOS 2.5  --  2.0* 2.3*  --  2.0*   CBC  Recent Labs Lab 11/17/15 1816 11/18/15 0356 11/19/15 0344  WBC 5.9 9.3 9.7  HGB 16.2 15.7 12.5*  HCT 48.3 45.3 38.1*  PLT 193 166 103*   Coag's  Recent Labs Lab 11/17/15 0216 11/17/15 1032  APTT 34 41*  INR 1.16 1.19   Sepsis Markers  Recent Labs Lab 11/16/15 2143 11/17/15 0218  LATICACIDVEN  11.85* 3.2*   ABG  Recent Labs Lab 11/17/15 0819 11/18/15 0420 11/19/15 0345  PHART 7.174* 7.293* 7.371  PCO2ART 37.0 46.1 53.5*  PO2ART 58.0* 93.1 100    Liver Enzymes  Recent Labs Lab 11/17/15 1032 11/19/15 0344  AST 108* 45*  ALT 79* 43  ALKPHOS 44 40  BILITOT 0.5 0.4  ALBUMIN 3.3* 2.5*    Cardiac Enzymes  Recent Labs Lab 11/17/15 0510 11/17/15 1032 11/17/15 1730  TROPONINI 1.83* 1.67* 0.89*   Glucose  Recent Labs Lab 11/18/15 1616 11/18/15 1946 11/19/15 0002 11/19/15 0021 11/19/15 0347 11/19/15 0813  GLUCAP 107* 112* 108* 105* 122* 122*   Imaging Dg Chest Port 1 View  Result Date:  11/19/2015 CLINICAL DATA:  Shortness of breath. EXAM: PORTABLE CHEST 1 VIEW COMPARISON:  11/18/2015. FINDINGS: Endotracheal tube, Swan-Ganz catheter, NG tube, left IJ line in stable position. Stable cardiomegaly. Unchanged bilateral pulmonary infiltrates and or edema, left side greater right. No pleural effusion or pneumothorax. Prior cervical spine fusion. IMPRESSION: 1. Lines and tubes in stable position. 2. Unchanged bilateral pulmonary infiltrates and or edema, left side greater right. 3. Stable cardiomegaly. Electronically Signed   By: Maisie Fushomas  Register   On: 11/19/2015 06:57   STUDIES:  8/27  LHC >> clean coronary arteries, normal LVEDP, normal LV function 8/28  CT head >> no acute intracranial abnormalites; fluid in the paranasal sinuses  8/28  TTE >> suboptimal study, LVEF <20%, severe global hypokinesis, mod LVH, bradycardia with PVCs, normal LA size, mild TR, PAP 31mmHg, trivial posterior pericardial effusion  CULTURES: 8/27 Sputum >>   ANTIBIOTICS: Unasyn 8/27>> 8/28 Rocephin 8/28 >>  SIGNIFICANT EVENTS: 8/27 Admit with VF arrest 8/28  Swan placed to 57 cm, PA pressure 28/13.  Rewarmed to normothermia   LINES/TUBES: ETT 8/27 >> L IJ TLC 8/27 >>  R IJ Cordis + Ernestine ConradSwan 8/28 >>   DISCUSSION: 60 year old male with a past medical history significant for obesity and hypertension was admitted 8/27 with a VF cardiac arrest. No evidence of coronary artery disease based on left heart catheterization. Later found to have LVEF of <20% on TTE (suboptimal study).  Differential diagnosis of his cardiac arrest includes a primary cardiac arrhythmia (most likely) versus less likely an acute CNS event or a pulmonary embolism.  ASSESSMENT / PLAN:  PULMONARY A: Acute Hypoxic Respiratory Failure  Possible Aspiration Pneumonia Rule Out Acute Pulmonary Embolism ARDS P:   PRVC 8cc/kg, R 35  PRN ABG Decrease PEEP to 14 and work on getting down to 10 since FiO2 is 60%. Trend CXR  Wean PEEP / FiO2  for sats >92% See ID Lasix as below VAP prevention measures  CARDIOVASCULAR A:  V. fib Arrest Cardiogenic Shock  P:  Consider repeat ECHO to reassess LV function, defer timing to Cards Milrinone per Cardiology  D/C amiodarone. PAC number noted. Lasix. Levophed for MAP > 65 down to 2 mcg.  RENAL A:   Hypokalemia Metabolic Acidosis  Hypomagnesemia  P:   Monitor BMET and UOP Replace electrolytes as needed D/C bicarb Lasix 80 mg IV q8 hours. Zaroxolyn 5 mg PO x1.  GASTROINTESTINAL A:   At Risk Protein Calorie Malnutrition  P:   OG tube TF per nutrition  Pepcid for stress ulcer prophylaxis Colace + bisacodyl for bowel regimen   HEMATOLOGIC A:   Low suspicion for PE P:  Monitor for bleeding Defer heparin for a-fib to cards SCD's + SQ heparin for DVT prophylaxis   INFECTIOUS A:   Possible aspiration  pneumonia P:   Follow cultures Continue rocephin    ENDOCRINE A:   Hyperglycemia  P:   Monitor glucose Add SSI, sensitive scale  NEUROLOGIC A:   Cardiac arrest, at risk for anoxic brain injury Hx Back Surgery with Implanted Stimulator P:   RASS goal:  -2 to -3 Continue fentanyl gtt for pain  Versed gtt for sedation  WUA this A.  FAMILY  - Updates: No family bedside.  - Inter-disciplinary family meet or Palliative Care meeting due by:  Day 7  The patient is critically ill with multiple organ systems failure and requires high complexity decision making for assessment and support, frequent evaluation and titration of therapies, application of advanced monitoring technologies and extensive interpretation of multiple databases.   Critical Care Time devoted to patient care services described in this note is  35  Minutes. This time reflects time of care of this signee Dr Koren Bound. This critical care time does not reflect procedure time, or teaching time or supervisory time of PA/NP/Med student/Med Resident etc but could involve care discussion  time.  Alyson Reedy, M.D. [facility] Pulmonary/Critical Care Medicine. Pager: [phone]. After hours pager: [phone].

## 2015-11-19 NOTE — Progress Notes (Signed)
    Subjective:  Intubated and sedated.   Objective:  Vital Signs in the last 24 hours: Temp:  [95.5 F (35.3 C)-99.3 F (37.4 C)] 98.1 F (36.7 C) (08/30 0900) Pulse Rate:  [85-102] 94 (08/30 0900) Resp:  [0-35] 19 (08/30 0900) BP: (113-137)/(56-63) 137/63 (08/30 0847) SpO2:  [94 %-100 %] 99 % (08/30 0900) Arterial Line BP: (98-145)/(52-66) 127/61 (08/30 0900) FiO2 (%):  [65 %-100 %] 65 % (08/30 0847) Weight:  [122.7 kg (270 lb 8.1 oz)] 122.7 kg (270 lb 8.1 oz) (08/30 0500)  Intake/Output from previous day: 08/29 0701 - 08/30 0700 In: 4839.5 [I.V.:4130.2; NG/GT:609.3; IV Piggyback:100] Out: 3040 [Urine:3040]  Physical Exam: Pt is intubated and sedated HEENT: normal Neck: JVP - elevated (CVP 18) Lungs: rales bilaterally CV: RRR without murmur or gallop Abd: soft, +BS Ext: 1+ pretibial edema Skin: warm/dry no rash  Lab Results:  Recent Labs  11/18/15 0356 11/19/15 0344  WBC 9.3 9.7  HGB 15.7 12.5*  PLT 166 103*    Recent Labs  11/18/15 0934 11/19/15 0344  NA 137 136  K 4.1 4.0  CL 106 101  CO2 23 29  GLUCOSE 110* 130*  BUN 12 17  CREATININE 0.75 0.88    Recent Labs  11/17/15 1032 11/17/15 1730  TROPONINI 1.67* 0.89*   Tele: Sinus rhythm with rare PVC's  Assessment/Plan:  1. Acute respiratory failure/VDRF 2. Out-of-hospital cardiac arrest 3. Acute systolic heart failure with acute cardiogenic shock 4. Pulmonary edema  5. VF/nonsustained VT 6. Thrombocytopenia with plt drop from 166 to 103 this am 7. Anemia, mild 8. Probable anoxic encephalopathy secondary to OOH arrest  Pt remains tenuous, still on norepi and milrinone. I/O's positive despite IV lasix yesterday. Renal function remains normal. Invasive hemodynamics suggest volume overload with PCWP 24, CVP 18, and high PAP but preserved cardiac output. Would push IV diuresis today with lasix 80 mg IV q8hrs, anticipate weaning milrinone but wait to see how he tolerates diuresis first. Stop  amiodarone. Reassess LV function with limited echo.  The patient is critically ill with multiple organ systems failure and requires high complexity decision making for assessment and support, frequent evaluation and titration of therapies, application of advanced monitoring technologies and extensive interpretation of multiple databases.   Critical Care Time devoted to patient care services described in this note is 35 minutes  Tonny Bollman, M.D. 11/19/2015, 9:54 AM

## 2015-11-19 NOTE — Progress Notes (Signed)
Nutrition Follow-up  DOCUMENTATION CODES:   Obesity unspecified  INTERVENTION:   Increase: Vital High Protein to 45 ml/hr 60 ml  Prostat BID Provides: 1080 ml, 1480 kcal, 154 grams protein, and 902 ml H2O.   MVI daily  NUTRITION DIAGNOSIS:   Inadequate oral intake related to inability to eat as evidenced by NPO status. Ongoing.   GOAL:   Provide needs based on ASPEN/SCCM guidelines Progressing.   MONITOR:   Vent status, I & O's, Labs  REASON FOR ASSESSMENT:   Consult Enteral/tube feeding initiation and management  ASSESSMENT:   60 y.o. Male with obesity, HTN, [facility] ED on 8/27 after a cardiac arrest at home. Total time of CPR estimated to be 16 minutes. Found to be in Vfib on 3 separate occasions, each successfully defibrillated.   Patient is currently intubated on ventilator support  Temp (24hrs), Avg:98.3 F (36.8 C), Min:97.7 F (36.5 C), Max:99.3 F (37.4 C)  Medications reviewed and include: colace, lasix, KCl, levophed, bicarb  Labs reviewed: PO4 2.0 CBG's: 122 OG tube: extends into the stomach Vital High Protein infusing @ 40 ml/hr with 30 ml prostat BID = 1160 kcal, 114 grams protein Weight increased by 20 lb since admission, pt is positive > 4 L  Diet Order:    NPO  Skin:  Reviewed, no issues  Last BM:  8/28  Height:   Ht Readings from Last 1 Encounters:  11/19/15 5\' 9"  (1.753 m)    Weight:   Wt Readings from Last 1 Encounters:  11/19/15 270 lb 8.1 oz (122.7 kg)    Ideal Body Weight:  75.45 kg  BMI:  Body mass index is 39.95 kg/m.  Estimated Nutritional Needs:   Kcal:  1243-1582 (11-14 kcal/kg)  Protein:  >/= 151 grams  Fluid:  >/= 1.2 L/day  EDUCATION NEEDS:   No education needs identified at this time  Kendell Bane RD, LDN, CNSC [phone] Pager [phone] After Hours Pager

## 2015-11-20 ENCOUNTER — Inpatient Hospital Stay (HOSPITAL_COMMUNITY): Payer: Managed Care, Other (non HMO)

## 2015-11-20 ENCOUNTER — Encounter (HOSPITAL_COMMUNITY): Payer: Self-pay | Admitting: Cardiovascular Disease

## 2015-11-20 DIAGNOSIS — I4891 Unspecified atrial fibrillation: Secondary | ICD-10-CM

## 2015-11-20 DIAGNOSIS — I480 Paroxysmal atrial fibrillation: Secondary | ICD-10-CM | POA: Diagnosis not present

## 2015-11-20 DIAGNOSIS — G931 Anoxic brain damage, not elsewhere classified: Secondary | ICD-10-CM

## 2015-11-20 LAB — CBC
HCT: 38.4 % — ABNORMAL LOW (ref 39.0–52.0)
HEMOGLOBIN: 12.6 g/dL — AB (ref 13.0–17.0)
MCH: 32.1 pg (ref 26.0–34.0)
MCHC: 32.8 g/dL (ref 30.0–36.0)
MCV: 98 fL (ref 78.0–100.0)
PLATELETS: 98 10*3/uL — AB (ref 150–400)
RBC: 3.92 MIL/uL — AB (ref 4.22–5.81)
RDW: 13.9 % (ref 11.5–15.5)
WBC: 11.4 10*3/uL — AB (ref 4.0–10.5)

## 2015-11-20 LAB — GLUCOSE, CAPILLARY
GLUCOSE-CAPILLARY: 152 mg/dL — AB (ref 65–99)
GLUCOSE-CAPILLARY: 151 mg/dL — AB (ref 65–99)
GLUCOSE-CAPILLARY: 131 mg/dL — AB (ref 65–99)
Glucose-Capillary: 127 mg/dL — ABNORMAL HIGH (ref 65–99)
Glucose-Capillary: 127 mg/dL — ABNORMAL HIGH (ref 65–99)

## 2015-11-20 LAB — BASIC METABOLIC PANEL
Anion gap: 9 (ref 5–15)
Anion gap: 9 (ref 5–15)
BUN: 21 mg/dL — ABNORMAL HIGH (ref 6–20)
BUN: 22 mg/dL — ABNORMAL HIGH (ref 6–20)
CALCIUM: 8 mg/dL — AB (ref 8.9–10.3)
CHLORIDE: 92 mmol/L — AB (ref 101–111)
CO2: 33 mmol/L — ABNORMAL HIGH (ref 22–32)
CO2: 32 mmol/L (ref 22–32)
CREATININE: 0.8 mg/dL (ref 0.61–1.24)
Calcium: 8.2 mg/dL — ABNORMAL LOW (ref 8.9–10.3)
Chloride: 92 mmol/L — ABNORMAL LOW (ref 101–111)
Creatinine, Ser: 0.79 mg/dL (ref 0.61–1.24)
GFR calc Af Amer: >60 mL/min (ref >60)
GFR calc non Af Amer: >60 mL/min (ref >60)
Glucose, Bld: 132 mg/dL — ABNORMAL HIGH (ref 65–99)
Glucose, Bld: 137 mg/dL — ABNORMAL HIGH (ref 65–99)
POTASSIUM: 3.4 mmol/L — AB (ref 3.5–5.1)
Potassium: 3.3 mmol/L — ABNORMAL LOW (ref 3.5–5.1)
SODIUM: 134 mmol/L — AB (ref 135–145)
Sodium: 133 mmol/L — ABNORMAL LOW (ref 135–145)

## 2015-11-20 LAB — POCT I-STAT 3, ART BLOOD GAS (G3+)
ACID-BASE EXCESS: 10 mmol/L — AB (ref 0.0–2.0)
BICARBONATE: 35.8 mmol/L — AB (ref 20.0–28.0)
O2 SAT: 90 %
PO2 ART: 57 mmHg — AB (ref 83.0–108.0)
Patient temperature: 98
TCO2: 37 mmol/L (ref 0–100)
pCO2 arterial: 51.4 mmHg — ABNORMAL HIGH (ref 32.0–48.0)
pH, Arterial: 7.45 (ref 7.350–7.450)

## 2015-11-20 LAB — ECHOCARDIOGRAM LIMITED
Height: 69 in
Weight: 4084.68 oz

## 2015-11-20 LAB — CARBOXYHEMOGLOBIN
CARBOXYHEMOGLOBIN: 1.3 % (ref 0.5–1.5)
Carboxyhemoglobin: 0.9 % (ref 0.5–1.5)
METHEMOGLOBIN: 1 % (ref 0.0–1.5)
METHEMOGLOBIN: 0.7 % (ref 0.0–1.5)
O2 Saturation: 75.6 %
O2 Saturation: 70.9 %
TOTAL HEMOGLOBIN: 12.4 g/dL (ref 12.0–16.0)
TOTAL HEMOGLOBIN: 17.7 g/dL — AB (ref 12.0–16.0)

## 2015-11-20 LAB — MAGNESIUM: MAGNESIUM: 1.9 mg/dL (ref 1.7–2.4)

## 2015-11-20 LAB — HEPARIN LEVEL (UNFRACTIONATED): Heparin Unfractionated: <0.1 IU/mL — ABNORMAL LOW (ref 0.30–0.70)

## 2015-11-20 LAB — PHOSPHORUS: PHOSPHORUS: 1.9 mg/dL — AB (ref 2.5–4.6)

## 2015-11-20 MED ORDER — HEPARIN (PORCINE) IN NACL 100-0.45 UNIT/ML-% IJ SOLN
2750.0000 [IU]/h | INTRAMUSCULAR | Status: DC
  Administered 2015-11-20: 1400 [IU]/h via INTRAVENOUS
  Administered 2015-11-21: 2300 [IU]/h via INTRAVENOUS
  Administered 2015-11-21: 1700 [IU]/h via INTRAVENOUS
  Administered 2015-11-22: 2750 [IU]/h via INTRAVENOUS
  Administered 2015-11-22: 2450 [IU]/h via INTRAVENOUS
  Administered 2015-11-22 – 2015-11-23 (×2): 2750 [IU]/h via INTRAVENOUS
  Filled 2015-11-20 (×7): qty 250

## 2015-11-20 MED ORDER — AMIODARONE IV BOLUS ONLY 150 MG/100ML
150.0000 mg | Freq: Once | INTRAVENOUS | Status: AC
  Administered 2015-11-20: 150 mg via INTRAVENOUS
  Filled 2015-11-20: qty 100

## 2015-11-20 MED ORDER — SODIUM CHLORIDE 0.9% FLUSH
10.0000 mL | Freq: Two times a day (BID) | INTRAVENOUS | Status: DC
  Administered 2015-11-20 – 2015-11-22 (×5): 10 mL
  Administered 2015-11-23: 20 mL
  Administered 2015-11-23 – 2015-11-25 (×4): 10 mL

## 2015-11-20 MED ORDER — DEXTROSE 5 % IV SOLN
40.0000 meq | Freq: Once | INTRAVENOUS | Status: AC
  Administered 2015-11-20: 40 meq via INTRAVENOUS
  Filled 2015-11-20: qty 9.09

## 2015-11-20 MED ORDER — SODIUM CHLORIDE 0.9% FLUSH
10.0000 mL | INTRAVENOUS | Status: DC | PRN
  Administered 2015-11-22: 20 mL
  Filled 2015-11-20: qty 40

## 2015-11-20 NOTE — Progress Notes (Signed)
Neurology Progress Note  Subjective: He has been off Versed since 630 am today. No major changes in neurologic status per RN. No significant overnight events reported.   Current drips:  Amiodarone 60 mg/hr Fentanyl 175 mcg/hr Norepinephrine 4 mcg/min Milrinone 0.25 mcg/kg/min  Current Meds:   Current Facility-Administered Medications:  .  0.9 %  sodium chloride infusion, , Intravenous, Continuous, Alyson Reedy, MD, Last Rate: 10 mL/hr at 11/19/15 1617 .  amiodarone (NEXTERONE PREMIX) 360-4.14 MG/200ML-% (1.8 mg/mL) IV infusion, 60 mg/hr, Intravenous, Continuous, Tonny Bollman, MD, Last Rate: 33.3 mL/hr at 11/20/15 0800, 60 mg/hr at 11/20/15 0800 .  bisacodyl (DULCOLAX) suppository 10 mg, 10 mg, Rectal, Daily PRN, Jeanella Craze, NP .  cefTRIAXone (ROCEPHIN) 2 g in dextrose 5 % 50 mL IVPB, 2 g, Intravenous, Q24H, Lupita Leash, MD, 2 g at 11/19/15 1809 .  chlorhexidine (PERIDEX) 0.12 % solution 15 mL, 15 mL, Mouth Rinse, BID, Lupita Leash, MD, 15 mL at 11/20/15 0800 .  docusate (COLACE) 50 MG/5ML liquid 100 mg, 100 mg, Per Tube, Daily, Jeanella Craze, NP, 100 mg at 11/19/15 1057 .  feeding supplement (PRO-STAT SUGAR FREE 64) liquid 60 mL, 60 mL, Per Tube, BID, Alyson Reedy, MD, 60 mL at 11/19/15 2232 .  feeding supplement (VITAL HIGH PROTEIN) liquid 1,000 mL, 1,000 mL, Per Tube, Q24H, Alyson Reedy, MD, 1,000 mL at 11/19/15 1323 .  fentaNYL (SUBLIMAZE) 2,500 mcg in sodium chloride 0.9 % 250 mL (10 mcg/mL) infusion, 25-400 mcg/hr, Intravenous, Continuous, Jeanella Craze, NP, Last Rate: 17.5 mL/hr at 11/20/15 0222, 175 mcg/hr at 11/20/15 0222 .  fentaNYL (SUBLIMAZE) bolus via infusion 50 mcg, 50 mcg, Intravenous, Q1H PRN, Jeanella Craze, NP .  fentaNYL (SUBLIMAZE) injection 50 mcg, 50 mcg, Intravenous, Once, Jeanella Craze, NP .  furosemide (LASIX) injection 80 mg, 80 mg, Intravenous, Q8H, Tonny Bollman, MD, 80 mg at 11/20/15 0837 .  heparin injection 5,000 Units, 5,000 Units,  Subcutaneous, Q8H, Jeanella Craze, NP, 5,000 Units at 11/20/15 1610 .  insulin aspart (novoLOG) injection 0-9 Units, 0-9 Units, Subcutaneous, Q4H, Jeanella Craze, NP, 2 Units at 11/20/15 [phone] .  MEDLINE mouth rinse, 15 mL, Mouth Rinse, 10 times per day, Lupita Leash, MD, 15 mL at 11/20/15 0418 .  midazolam (VERSED) 50 mg in sodium chloride 0.9 % 50 mL (1 mg/mL) infusion, 0-10 mg/hr, Intravenous, Continuous, Jeanella Craze, NP, Stopped at 11/20/15 0630 .  midazolam (VERSED) bolus via infusion 1-2 mg, 1-2 mg, Intravenous, Q2H PRN, Jeanella Craze, NP .  milrinone (PRIMACOR) 20 MG/100 ML (0.2 mg/mL) infusion, 0.25 mcg/kg/min, Intravenous, Continuous, Alyson Reedy, MD, Last Rate: 8.5 mL/hr at 11/19/15 2250, 0.25 mcg/kg/min at 11/19/15 2250 .  multivitamin liquid 15 mL, 15 mL, Per Tube, Daily, Alyson Reedy, MD, 15 mL at 11/19/15 1323 .  norepinephrine (LEVOPHED) 16 mg in dextrose 5 % 250 mL (0.064 mg/mL) infusion, 2-100 mcg/min, Intravenous, Continuous, Lupita Leash, MD, Last Rate: 3.8 mL/hr at 11/19/15 1800, 4 mcg/min at 11/19/15 1800 .  potassium chloride 20 MEQ/15ML (10%) solution 40 mEq, 40 mEq, Oral, BID, Tonny Bollman, MD, 40 mEq at 11/19/15 2232 .  potassium phosphate 40 mEq in dextrose 5 % 500 mL infusion, 40 mEq, Intravenous, Once, Shane Crutch, MD, 40 mEq at 11/20/15 5409 .  sodium chloride flush (NS) 0.9 % injection 10-40 mL, 10-40 mL, Intracatheter, Q12H, Lupita Leash, MD .  sodium chloride flush (NS) 0.9 % injection 10-40  mL, 10-40 mL, Intracatheter, PRN, Lupita Leashouglas B McQuaid, MD  Objective:  Temp:  [97.5 F (36.4 C)-99.5 F (37.5 C)] 97.5 F (36.4 C) (08/31 0435) Pulse Rate:  [94-130] 113 (08/31 0435) Resp:  [17-37] 35 (08/31 0435) BP: (99-137)/(54-68) 123/68 (08/31 0435) SpO2:  [93 %-100 %] 93 % (08/31 0435) Arterial Line BP: (86-127)/(48-63) 111/59 (08/31 0000) FiO2 (%):  [60 %-65 %] 60 % (08/31 0435) Weight:  [115.8 kg (255 lb 4.7 oz)] 115.8 kg (255 lb 4.7  oz) (08/31 0435)  General: WDWN Caucasian man lying in ICU bed. He is intubated on a fentanyl drip. He opens his eyes to voice and will turn his head towards my voice when I call his name from either side. He does not fix or track. He does not follow commands.  HEENT: Neck is supple without lymphadenopathy. ETT and OGT in place. Sclerae are anicteric. There is mild conjunctival injection.  CV: Regular, no murmur.  Lungs: CTAB  Neuro: MS: As noted above. No aphasia.  CN: Pupils are equal and reactive from 3-->2 mm bilaterally. Eyes initially dysconjugate but become conjugate with stimulation. He appears to blink to visual threat but this is inconsistent. Oculocephalics are intact. Corneals are intact and symmetric. His face appears grossly symmetric but is partly obscured by tubes and tape. He has a cough with suction. The remainder of his cranial nerves cannot be accurately assessed as he is unable to participate with the exam. Motor: Normal bulk. Tone is reduced throughout. He has some spontaneous movement of both arms that appears largely nonpurposeful. He did appear to localize to pain on the left but this was not consistent. No tremor or other abnormal movements are observed.  Sensation: He has flexion withdrawal to nailbed pressure x4 with inconsistent localization to pain on the left.  DTRs: 3+, symmetric with sustained clonus at both ankles. Toes are downgoing bilaterally. Hoffman's is absent bilaterally.  Coordination and gait: These cannot be assessed as he is unable to participate with the exam.   Labs: Lab Results  Component Value Date   WBC 11.4 (H) 11/20/2015   HGB 12.6 (L) 11/20/2015   HCT 38.4 (L) 11/20/2015   PLT 98 (L) 11/20/2015   GLUCOSE 132 (H) 11/20/2015   ALT 43 11/19/2015   AST 45 (H) 11/19/2015   NA 134 (L) 11/20/2015   K 3.4 (L) 11/20/2015   CL 92 (L) 11/20/2015   CREATININE 0.79 11/20/2015   BUN 21 (H) 11/20/2015   CO2 33 (H) 11/20/2015   TSH 1.597 11/18/2015    INR 1.19 11/17/2015   CBC Latest Ref Rng & Units 11/20/2015 11/19/2015 11/18/2015  WBC 4.0 - 10.5 K/uL 11.4(H) 9.7 9.3  Hemoglobin 13.0 - 17.0 g/dL 12.6(L) 12.5(L) 15.7  Hematocrit 39.0 - 52.0 % 38.4(L) 38.1(L) 45.3  Platelets 150 - 400 K/uL 98(L) 103(L) 166    No results found for: HGBA1C Lab Results  Component Value Date   ALT 43 11/19/2015   AST 45 (H) 11/19/2015   ALKPHOS 40 11/19/2015   BILITOT 0.4 11/19/2015   Mg 1.9 Phos 1.9  Radiology: There is no new neuroimaging for review.   Other diagnostic studies:  EEG 11/19/15 showed severe diffuse slowing consistent with sedation and anoxia, no seizures.   A/P:   1. Anoxic brain injury: This is acute, due to out of hospital cardiac arrest with first identified rhythm ventricular fibrillation. Total downtime is unknown with documented resuscitation time after EMS arrived of 16 minutes. He has now day #2  after completing the therapeutic hypothermia protocol, as he reached normothermia at approximately noon on 11/18/15. Treatment is supportive, as noted below.  2. Anoxic encephalopathy: This is acute, due to anoxic brain injury from cardiac arrest. Brainstem remains intact and today he opens his eyes spontaneously, turns towards my voice, and has spontaneous movement of the arms. These would suggest a favorable prognosis for meaningful neurologic recovery at this point. Continue with supportive care. Avoid hypoxemia and hypotension for even brief intervals as these are associated with worse neurologic outcomes. Fever and hyperglycemia must be aggressively treated for the same reason. We will continue to follow the exam to aid with neurologic prognosis.   This patient is critically ill and at significant risk of neurological worsening, death and care requires constant monitoring of vital signs, hemodynamics,respiratory and cardiac monitoring, neurological assessment, discussion with family, other specialists and medical decision making of  high complexity. A total of 30 minutes of critical care time was spent on this case.   Rhona Leavens, MD [facility]

## 2015-11-20 NOTE — Progress Notes (Signed)
ANTICOAGULATION CONSULT NOTE  Pharmacy Consult for heparin Indication: Afib   No Known Allergies  Patient Measurements: Height: 5\' 9"  (175.3 cm) Weight: 255 lb 4.7 oz (115.8 kg) IBW/kg (Calculated) : 70.7 Heparin Dosing Weight: estimate 92 kg   Vital Signs: Temp: 100.6 F (38.1 C) (08/31 1100) Temp Source: Core (Comment) (08/31 0800) BP: 98/57 (08/31 1153) Pulse Rate: 109 (08/31 1153)  Labs:  Recent Labs  11/18/15 0154  11/18/15 0356  11/18/15 1011 11/19/15 0344 11/20/15 0411 11/20/15 0830 11/20/15 1700  HGB  --   < > 15.7  --   --  12.5* 12.6*  --   --   HCT  --   --  45.3  --   --  38.1* 38.4*  --   --   PLT  --   --  166  --   --  103* 98*  --   --   HEPARINUNFRC 0.36  --   --   --  0.36  --   --   --  <0.10*  CREATININE  --   --  0.82  < >  --  0.88 0.79 0.80  --   < > = values in this interval not displayed.  Estimated Creatinine Clearance: 123.2 mL/min (by C-G formula based on SCr of 0.8 mg/dL).  Assessment: 60 yo male s/p cardiac arrest with CPR, cooled via the hypothermia protocol - now fully re-warmed. Pharmacy intially consulted to dose heparin for ACS/STEMI and possible PE, however, cath clean and low suspicion for PE. Heparin discontinued 8/29. Heparin resumed today for new AFib with RVR. Heparin restarted at previous rate of 1400 units/hr with no bolus. Hgb downtrending from 15.7 to 12.6, plt from 166 to 98. No signs, symptoms, or current concerns related to bleeding noted per nursing. Will need to monitor closely. RN endorses no problems with the heparin drip and states it has been running fine all day. Heparin level is undetectable this evening, will increase rate and f/u with another level.  Goal of Therapy:  Heparin level 0.3-0.7 units/ml Monitor platelets by anticoagulation protocol: Yes   Plan:  NO heparin bolus  Increase heparin drip to 1700 units/hr  Heparin level in 6 hours Heparin level and CBC daily Monitor for signs and symptoms of  bleeding    Thank you for allowing [place] to participate in this patients care. Signe Colt, PharmD Pager: [phone] 11/20/15 7:14 PM

## 2015-11-20 NOTE — Progress Notes (Signed)
  Echocardiogram 2D Echocardiogram Limited has been performed.  Arthur Chase M 11/20/2015, 1:08 PM

## 2015-11-20 NOTE — Progress Notes (Signed)
    Subjective:  Intubated sedated  Objective:  Vital Signs in the last 24 hours: Temp:  [97.3 F (36.3 C)-99.5 F (37.5 C)] 99.5 F (37.5 C) (08/31 0800) Pulse Rate:  [94-130] 120 (08/31 0800) Resp:  [17-37] 35 (08/31 0800) BP: (99-137)/(54-68) 123/68 (08/31 0435) SpO2:  [93 %-100 %] 100 % (08/31 0800) Arterial Line BP: (86-127)/(48-71) 124/59 (08/31 0800) FiO2 (%):  [60 %] 60 % (08/31 0800) Weight:  [115.8 kg (255 lb 4.7 oz)] 115.8 kg (255 lb 4.7 oz) (08/31 0435)  Intake/Output from previous day: 08/30 0701 - 08/31 0700 In: 3923.8 [I.V.:2776; NG/GT:1097.8; IV Piggyback:50] Out: 7400 [Urine:7400]  Physical Exam: Pt is intubated and sedated HEENT: normal Neck: JVP - elevated Lungs: CTA bilaterally CV: irregularly irregular Abd: soft, positive BS Ext: no C/C/E, distal pulses intact and equal Skin: warm/dry no rash   Lab Results:  Recent Labs  11/19/15 0344 11/20/15 0411  WBC 9.7 11.4*  HGB 12.5* 12.6*  PLT 103* 98*    Recent Labs  11/19/15 0344 11/20/15 0411  NA 136 134*  K 4.0 3.4*  CL 101 92*  CO2 29 33*  GLUCOSE 130* 132*  BUN 17 21*  CREATININE 0.88 0.79    Recent Labs  11/17/15 1032 11/17/15 1730  TROPONINI 1.67* 0.89*    Cardiac Studies: CXR: IMPRESSION: 1.  Lines and tubes in stable position.  2. Cardiomegaly with mild pulmonary venous congestion.  3. Low lung volumes with mild bibasilar atelectasis and/or infiltrates  Tele: Atrial fibrillation heart rate 120 bpm  Assessment/Plan:  1. Acute respiratory failure/VDRF - HD#4 2. Out-of-hospital cardiac arrest 3. Acute systolic heart failure with acute cardiogenic shock - improving 4. Pulmonary edema - improving on CXR 5. VF/nonsustained VT - rhythm now stable 6. Thrombocytopenia - plt stabilized at 98,000 - continue to follow closely 7. Anoxic encephalopathy secondary to OOH arrest - neurology team following 8. Atrial fibrillation with RVR  Invasive hemodynamics again  reviewed. Co-ox adequate and CO is maintained. Filling pressures remain elevated but are improving with diuresis. Renal function is preserved. Continue diuresis with IV lasix. Should be ok to DC [place] today.  Pt went into AF with RVR yesterday afternoon. Will continue IV amiodarone and start to decrease milrinone and levophed as tolerated. Decrease milrinone to 0.125 mcg this am. Restart IV heparin for atrial fibrillation. Monitor plt count. Monitor renal fxn. Family updated at length yesterday afternoon, not present this am.   The patient is critically ill with multiple organ systems failure and requires high complexity decision making for assessment and support, frequent evaluation and titration of therapies, application of advanced monitoring technologies and extensive interpretation of multiple databases.   Critical Care Time devoted to patient care services described in this note is 40 min.  Tonny Bollman, M.D. 11/20/2015, 9:05 AM

## 2015-11-20 NOTE — Progress Notes (Signed)
PULMONARY / CRITICAL CARE MEDICINE   Name: Arthur MaesGarrett D Spiker MRN: [id number] DOB: 04/14/1955    ADMISSION DATE:  11/16/2015 CONSULTATION DATE:  11/16/2015  REFERRING MD:  Dr. Peter SwazilandJordan  CHIEF COMPLAINT:  Cardiac arrest  HISTORY OF PRESENT ILLNESS:   60 year old male with obesity and hypertension was [facility] on 11/16/2015 after a cardiac arrest at home. His children provide history as he was comatose on our evaluation. They state that he had been in his usual state of health but on 11/16/2015 they could hear him breathing heavily from another room. They came immediately found him to be unresponsive. They called 911 and started CPR. The fire department arrived nearly immediately and her vital shock via AED and CPR. Paramedics arrived and found him to be in V. fib. Total time of CPR is estimated to be approximately 16 minutes. En route he had V. fib on 2 other occasions which were successfully defibrillated. In the emergency department he was noted to be agitated while intubated. He was taken to the Cath Lab. In the Cath Lab he was noted to have clean coronary arteries and a normal left ventricular ejection fraction. Left ventricular end-diastolic pressure was 18.  SUBJECTIVE:   SVO2 75.6 / CO maintained, milrinone decreased per Cardiology.  RN reports pt converted to SR then went back into AF.  Amiodarone restarted with bolus.  Remains on levophed.  PEEP to 14 / FiO2 60%  VITAL SIGNS: BP (!) 98/57   Pulse (!) 109   Temp (!) 100.6 F (38.1 C)   Resp (!) 35   Ht 5\' 9"  (1.753 m)   Wt 255 lb 4.7 oz (115.8 kg)   SpO2 99%   BMI 37.70 kg/m   HEMODYNAMICS: PAP: (38-58)/(25-38) 41/30 CVP:  [13 mmHg-35 mmHg] 13 mmHg PCWP:  [22 mmHg] 22 mmHg CO:  [6.8 L/min] 6.8 L/min CI:  [3 L/min/m2] 3 L/min/m2  VENTILATOR SETTINGS: Vent Mode: PRVC FiO2 (%):  [60 %] 60 % Set Rate:  [35 bmp] 35 bmp Vt Set:  [530 mL-554 mL] 530 mL PEEP:  [14 cmH20] 14 cmH20 Plateau  Pressure:  [25 cmH20-31 cmH20] 31 cmH20  INTAKE / OUTPUT: I/O last 3 completed shifts: In: 6625.7 [I.V.:4868; NG/GT:1622.8; IV Piggyback:135] Out: 8150 [Urine:8150]  PHYSICAL EXAMINATION: General:  obese male in NAD on ventilator. Neuro: Sedate, localizes to pain, opens eyes with physical stimulation HEENT:  Normocephalic atraumatic, endotracheal tube in place. Cardiovascular:  Regular rate and rhythm, no murmurs gallops or rubs, 2+ radial pulses Lungs:  even/non-labored, lungs bilaterally distant but clear  Abdomen:  Obese abdomen, nontender nondistended Musculoskeletal:  Normal bulk and tone Skin:  warm/dry, generalized edema   LABS:  BMET  Recent Labs Lab 11/19/15 0344 11/20/15 0411 11/20/15 0830  NA 136 134* 133*  K 4.0 3.4* 3.3*  CL 101 92* 92*  CO2 29 33* 32  BUN 17 21* 22*  CREATININE 0.88 0.79 0.80  GLUCOSE 130* 132* 137*   Electrolytes  Recent Labs Lab 11/19/15 0344 11/19/15 1638 11/20/15 0411 11/20/15 0830  CALCIUM 7.0*  --  8.2* 8.0*  MG 2.0 1.9 1.9  --   PHOS 2.0* 1.8* 1.9*  --    CBC  Recent Labs Lab 11/18/15 0356 11/19/15 0344 11/20/15 0411  WBC 9.3 9.7 11.4*  HGB 15.7 12.5* 12.6*  HCT 45.3 38.1* 38.4*  PLT 166 103* 98*   Coag's  Recent Labs Lab 11/17/15 0216 11/17/15 1032  APTT 34 41*  INR  1.16 1.19   Sepsis Markers  Recent Labs Lab 11/16/15 2143 11/17/15 0218  LATICACIDVEN 11.85* 3.2*   ABG  Recent Labs Lab 11/18/15 0420 11/19/15 0345 11/20/15 0406  PHART 7.293* 7.371 7.450  PCO2ART 46.1 53.5* 51.4*  PO2ART 93.1 100 57.0*    Liver Enzymes  Recent Labs Lab 11/17/15 1032 11/19/15 0344  AST 108* 45*  ALT 79* 43  ALKPHOS 44 40  BILITOT 0.5 0.4  ALBUMIN 3.3* 2.5*    Cardiac Enzymes  Recent Labs Lab 11/17/15 0510 11/17/15 1032 11/17/15 1730  TROPONINI 1.83* 1.67* 0.89*   Glucose  Recent Labs Lab 11/19/15 1604 11/19/15 1951 11/19/15 2341 11/20/15 0400 11/20/15 0829 11/20/15 1138  GLUCAP  133* 110* 120* 127* 152* 151*   Imaging Dg Chest Port 1 View  Result Date: 11/20/2015 CLINICAL DATA:  Cardiac arrest. EXAM: PORTABLE CHEST 1 VIEW COMPARISON:  11/19/2015 . FINDINGS: Endotracheal tube, NG tube, Swan-Ganz catheter in stable position. Cardiomegaly with mild pulmonary venous congestion noted. Low lung volumes with mild bibasilar atelectasis and/or infiltrates. No pleural effusion or pneumothorax .Prior cervical spine fusion. IMPRESSION: 1.  Lines and tubes in stable position. 2. Cardiomegaly with mild pulmonary venous congestion. 3. Low lung volumes with mild bibasilar atelectasis and/or infiltrates. Electronically Signed   By: Maisie Fus  Register   On: 11/20/2015 07:09   STUDIES:  8/27  LHC >> clean coronary arteries, normal LVEDP, normal LV function 8/28  CT head >> no acute intracranial abnormalites; fluid in the paranasal sinuses  8/28  TTE >> suboptimal study, LVEF <20%, severe global hypokinesis, mod LVH, bradycardia with PVCs, normal LA size, mild TR, PAP , trivial posterior pericardial effusion  CULTURES: 8/27 Sputum >> multiple organisms present, none predominant   ANTIBIOTICS: Unasyn 8/27>> 8/28 Rocephin 8/28 >>   SIGNIFICANT EVENTS: 8/27 Admit with VF arrest 8/28  Swan placed to 57 cm, PA pressure 28/13.  Rewarmed to normothermia   LINES/TUBES: ETT 8/27 >> L IJ TLC 8/27 >>  R IJ Cordis 8/28 >> Swan 8/28 >> 8/31  DISCUSSION: 60 year old male with a past medical history significant for obesity and hypertension was admitted 8/27 with a VF cardiac arrest. No evidence of coronary artery disease based on left heart catheterization. Later found to have LVEF of <20% on TTE (suboptimal study).  Differential diagnosis of his cardiac arrest includes a primary cardiac arrhythmia (most likely) versus less likely an acute CNS event or a pulmonary embolism.  ASSESSMENT / PLAN:  PULMONARY A: Acute Hypoxic Respiratory Failure  Possible Aspiration Pneumonia Rule Out  Acute Pulmonary Embolism ARDS P:   PRVC 8cc/kg  PRN ABG Wean PEEP / FiO2 for sats >92% Trend CXR  See ID Lasix as below VAP prevention measures  CARDIOVASCULAR A:  V. fib Arrest - out of hospital cardiac arrest  Cardiogenic Shock Acute Systolic CHF  AF with RVR  P:  Consider repeat ECHO to reassess LV function, defer timing to Cards Milrinone per Cardiology  D/C amiodarone. Tele monitoring  Heparin gtt per pharmacy  Lasix 80 mg IV Q8  Levophed for MAP > 65   D/C Swan, keep cordis introducer   RENAL A:   Hypokalemia Metabolic Acidosis  Hypomagnesemia  P:   Monitor BMET and UOP Replace electrolytes as needed Lasix 80 mg IV q8 hours.  GASTROINTESTINAL A:   At Risk Protein Calorie Malnutrition  P:   OG tube TF per nutrition  Pepcid for stress ulcer prophylaxis Colace + bisacodyl for bowel regimen   HEMATOLOGIC A:  Low suspicion for PE Thrombocytopenia  P:  Monitor for bleeding Heparin gtt per pharmacy  SCD's + heparin for DVT prophylaxis   INFECTIOUS A:   Possible aspiration pneumonia P:   Follow cultures Continue rocephin    ENDOCRINE A:   Hyperglycemia  P:   Monitor glucose SSI, sensitive scale  NEUROLOGIC A:   Concern for Anoxic Injury - in setting of cardiac arrest  Hx Back Surgery with Implanted Stimulator P:   RASS goal: -1 to -2 Continue fentanyl gtt for pain  Versed gtt for sedation  Daily WUA / SBT   FAMILY  - Updates: No family at bedside am 8/31.    - Inter-disciplinary family meet or Palliative Care meeting due by:  Day 7   [name], NP-C [facility] Pgr: [phone] or if no answer [phone] 11/20/2015, 12:33 PM  Attending Note:  60 year old male with very little medical history who suffered a cardiac arrest and underwent cooling protocol.  Patient localizes on exam but not following commands.  I reviewed CXR myself, pulmonary edema noted.  Echo ordered, continue diureses, hold off weaning for  now.  Maintain full vent support.  Family updated bedside.  FMLA papers filled.  Will f/u with neurology.  The patient is critically ill with multiple organ systems failure and requires high complexity decision making for assessment and support, frequent evaluation and titration of therapies, application of advanced monitoring technologies and extensive interpretation of multiple databases.   Critical Care Time devoted to patient care services described in this note is  35  Minutes. This time reflects time of care of this signee Dr Koren Bound. This critical care time does not reflect procedure time, or teaching time or supervisory time of PA/NP/Med student/Med Resident etc but could involve care discussion time.  Alyson Reedy, M.D. [facility] Pulmonary/Critical Care Medicine. Pager: [phone]. After hours pager: [phone].

## 2015-11-20 NOTE — Progress Notes (Signed)
ANTICOAGULATION CONSULT NOTE - Initial Consult (Afib)  Pharmacy Consult for heparin Indication: Afib   No Known Allergies  Patient Measurements: Height: 5\' 9"  (175.3 cm) Weight: 255 lb 4.7 oz (115.8 kg) IBW/kg (Calculated) : 70.7 Heparin Dosing Weight: estimate 92 kg   Vital Signs: Temp: 99.5 F (37.5 C) (08/31 0800) Temp Source: Core (Comment) (08/31 0000) BP: 123/68 (08/31 0435) Pulse Rate: 120 (08/31 0800)  Labs:  Recent Labs  11/17/15 1730  11/17/15 1816  11/18/15 0154 11/18/15 0356  11/18/15 1011 11/19/15 0344 11/20/15 0411 11/20/15 0830  HGB  --   < > 16.2  --   --  15.7  --   --  12.5* 12.6*  --   HCT  --   < > 48.3  --   --  45.3  --   --  38.1* 38.4*  --   PLT  --   < > 193  --   --  166  --   --  103* 98*  --   HEPARINUNFRC  --   --  0.34  --  0.36  --   --  0.36  --   --   --   CREATININE  --   --   --   < >  --  0.82  < >  --  0.88 0.79 0.80  TROPONINI 0.89*  --   --   --   --   --   --   --   --   --   --   < > = values in this interval not displayed.  Estimated Creatinine Clearance: 123.2 mL/min (by C-G formula based on SCr of 0.8 mg/dL).  Assessment: 60 yo male s/p cardiac arrest with CPR, cooled via the hypothermia protocol - now fully re-warmed. Pharmacy intially consulted to dose heparin for ACS/STEMI and possible PE, however, cath clean and low suspicion for PE. Heparin discontinued 8/29. Now to resume heparin gtt today for new AFib with RVR. Heparin gtt to start at last rate of 1400 units/hr with no bolus. Hgb downtrending from 15.7 to 12.6, plt from 166 to 98. No signs, symptoms, or current concerns related to bleeding noted per nursing. Will need to monitor closely.   Goal of Therapy:  Heparin level 0.3-0.7 units/ml Monitor platelets by anticoagulation protocol: Yes   Plan:  NO heparin bolus  Start heparin drip at 1400 units/hr  Heparin level in 6 hours Heparin level and CBC daily Monitor for signs and symptoms of bleeding   York Cerise, PharmD Pharmacy Resident  Pager [phone] 11/20/15 11:00 AM

## 2015-11-20 NOTE — Procedures (Signed)
Electroencephalogram (EEG) Report  Date of study: 11/19/15  Requesting clinician: Koren BoundWesam Yacoub, MD  Reason for study: Evaluate cerebral function after cardiac arrest  Brief clinical history: This is a 60 year old man with anoxic brain injury following cardiac arrest. EEG is being performed for evaluation of cervical function.  Medications:  Current Facility-Administered Medications:  .  0.9 %  sodium chloride infusion, , Intravenous, Continuous, Alyson ReedyWesam G Yacoub, MD, Last Rate: 10 mL/hr at 11/19/15 1617 .  amiodarone (NEXTERONE PREMIX) 360-4.14 MG/200ML-% (1.8 mg/mL) IV infusion, 60 mg/hr, Intravenous, Continuous, Tonny BollmanMichael Cooper, MD, Last Rate: 33.3 mL/hr at 11/20/15 0200, 60 mg/hr at 11/20/15 0200 .  bisacodyl (DULCOLAX) suppository 10 mg, 10 mg, Rectal, Daily PRN, Jeanella CrazeBrandi L Ollis, NP .  cefTRIAXone (ROCEPHIN) 2 g in dextrose 5 % 50 mL IVPB, 2 g, Intravenous, Q24H, Lupita Leashouglas B McQuaid, MD, 2 g at 11/19/15 1809 .  chlorhexidine (PERIDEX) 0.12 % solution 15 mL, 15 mL, Mouth Rinse, BID, Lupita Leashouglas B McQuaid, MD, 15 mL at 11/19/15 2002 .  docusate (COLACE) 50 MG/5ML liquid 100 mg, 100 mg, Per Tube, Daily, Jeanella CrazeBrandi L Ollis, NP, 100 mg at 11/19/15 1057 .  feeding supplement (PRO-STAT SUGAR FREE 64) liquid 60 mL, 60 mL, Per Tube, BID, Alyson ReedyWesam G Yacoub, MD, 60 mL at 11/19/15 2232 .  feeding supplement (VITAL HIGH PROTEIN) liquid 1,000 mL, 1,000 mL, Per Tube, Q24H, Alyson ReedyWesam G Yacoub, MD, 1,000 mL at 11/19/15 1323 .  fentaNYL (SUBLIMAZE) 2,500 mcg in sodium chloride 0.9 % 250 mL (10 mcg/mL) infusion, 25-400 mcg/hr, Intravenous, Continuous, Jeanella CrazeBrandi L Ollis, NP, Last Rate: 17.5 mL/hr at 11/20/15 0222, 175 mcg/hr at 11/20/15 0222 .  fentaNYL (SUBLIMAZE) bolus via infusion 50 mcg, 50 mcg, Intravenous, Q1H PRN, Jeanella CrazeBrandi L Ollis, NP .  fentaNYL (SUBLIMAZE) injection 50 mcg, 50 mcg, Intravenous, Once, Jeanella CrazeBrandi L Ollis, NP .  furosemide (LASIX) injection 80 mg, 80 mg, Intravenous, Q8H, Tonny BollmanMichael Cooper, MD, 80 mg at 11/20/15  0006 .  heparin injection 5,000 Units, 5,000 Units, Subcutaneous, Q8H, Jeanella CrazeBrandi L Ollis, NP, 5,000 Units at 11/20/15 [id number] .  insulin aspart (novoLOG) injection 0-9 Units, 0-9 Units, Subcutaneous, Q4H, Jeanella CrazeBrandi L Ollis, NP, 1 Units at 11/20/15 0412 .  MEDLINE mouth rinse, 15 mL, Mouth Rinse, 10 times per day, Lupita Leashouglas B McQuaid, MD, 15 mL at 11/20/15 0418 .  midazolam (VERSED) 50 mg in sodium chloride 0.9 % 50 mL (1 mg/mL) infusion, 0-10 mg/hr, Intravenous, Continuous, Jeanella CrazeBrandi L Ollis, NP, Stopped at 11/20/15 0630 .  midazolam (VERSED) bolus via infusion 1-2 mg, 1-2 mg, Intravenous, Q2H PRN, Jeanella CrazeBrandi L Ollis, NP .  milrinone (PRIMACOR) 20 MG/100 ML (0.2 mg/mL) infusion, 0.25 mcg/kg/min, Intravenous, Continuous, Alyson ReedyWesam G Yacoub, MD, Last Rate: 8.5 mL/hr at 11/19/15 2250, 0.25 mcg/kg/min at 11/19/15 2250 .  multivitamin liquid 15 mL, 15 mL, Per Tube, Daily, Alyson ReedyWesam G Yacoub, MD, 15 mL at 11/19/15 1323 .  norepinephrine (LEVOPHED) 16 mg in dextrose 5 % 250 mL (0.064 mg/mL) infusion, 2-100 mcg/min, Intravenous, Continuous, Lupita Leashouglas B McQuaid, MD, Last Rate: 3.8 mL/hr at 11/19/15 1800, 4 mcg/min at 11/19/15 1800 .  potassium chloride 20 MEQ/15ML (10%) solution 40 mEq, 40 mEq, Oral, BID, Tonny BollmanMichael Cooper, MD, 40 mEq at 11/19/15 2232 .  potassium phosphate 40 mEq in dextrose 5 % 500 mL infusion, 40 mEq, Intravenous, Once, Shane CrutchPradeep Ramachandran, MD, 40 mEq at 11/20/15 [id number] .  sodium chloride flush (NS) 0.9 % injection 10-40 mL, 10-40 mL, Intracatheter, Q12H, Lupita Leashouglas B McQuaid, MD .  sodium chloride flush (NS) 0.9 %  injection 10-40 mL, 10-40 mL, Intracatheter, PRN, Lupita Leash, MD  Description: This is a routine EEG performed using standard international 10-20 electrode placement. A total of 18 channels are recorded, including one for the EKG.  Activating Maneuvers: None  Findings:  The EKG channel demonstrates are regular rhythm with a rate of about 90 beats per minute.   The background consists of severe diffuse  generalized slowing with average background frequency of about 1-2 Hz. Amplitudes are markedly reduced throughout. This background is nonreactive to external stimulation.  There are no focal asymmetries. No epileptiform discharges are present. No seizures are recorded.   Prominent muscle artifact is present through much of the recording which appears to be due to some shivering. At times, this obscures interpretation of the underlying background. However, enough of the background is able to be interpreted for this to be a valid study.    Impression: This is an abnormal EEG due to severe diffuse generalized slowing with poor reactivity. In this setting, this would be consistent with a severe anoxic brain injury. However, cannot exclude sedative effect contributing to this appearance as well. If clinically warranted, consider repeating this study once the patient is off sedation. There is no evidence of seizure activity.   Rhona Leavens, MD [facility]

## 2015-11-21 ENCOUNTER — Inpatient Hospital Stay (HOSPITAL_COMMUNITY): Payer: Managed Care, Other (non HMO)

## 2015-11-21 DIAGNOSIS — G8191 Hemiplegia, unspecified affecting right dominant side: Secondary | ICD-10-CM

## 2015-11-21 LAB — HEPARIN LEVEL (UNFRACTIONATED)
Heparin Unfractionated: 0.17 IU/mL — ABNORMAL LOW (ref 0.30–0.70)
Heparin Unfractionated: 0.23 IU/mL — ABNORMAL LOW (ref 0.30–0.70)

## 2015-11-21 LAB — BASIC METABOLIC PANEL
Anion gap: 11 (ref 5–15)
BUN: 25 mg/dL — AB (ref 6–20)
CALCIUM: 8.1 mg/dL — AB (ref 8.9–10.3)
CHLORIDE: 92 mmol/L — AB (ref 101–111)
CO2: 33 mmol/L — AB (ref 22–32)
CREATININE: 0.83 mg/dL (ref 0.61–1.24)
GFR calc Af Amer: >60 mL/min (ref >60)
GFR calc non Af Amer: >60 mL/min (ref >60)
GLUCOSE: 118 mg/dL — AB (ref 65–99)
Potassium: 2.9 mmol/L — ABNORMAL LOW (ref 3.5–5.1)
Sodium: 136 mmol/L (ref 135–145)

## 2015-11-21 LAB — CBC
HEMATOCRIT: 34 % — AB (ref 39.0–52.0)
HEMOGLOBIN: 11.2 g/dL — AB (ref 13.0–17.0)
MCH: 32.4 pg (ref 26.0–34.0)
MCHC: 32.9 g/dL (ref 30.0–36.0)
MCV: 98.3 fL (ref 78.0–100.0)
Platelets: 99 10*3/uL — ABNORMAL LOW (ref 150–400)
RBC: 3.46 MIL/uL — ABNORMAL LOW (ref 4.22–5.81)
RDW: 14.2 % (ref 11.5–15.5)
WBC: 10.2 10*3/uL (ref 4.0–10.5)

## 2015-11-21 LAB — POCT I-STAT 3, ART BLOOD GAS (G3+)
ACID-BASE EXCESS: 17 mmol/L — AB (ref 0.0–2.0)
Bicarbonate: 40.7 mmol/L — ABNORMAL HIGH (ref 20.0–28.0)
O2 SAT: 100 %
PCO2 ART: 42.4 mmHg (ref 32.0–48.0)
PH ART: 7.59 — AB (ref 7.350–7.450)
TCO2: 42 mmol/L (ref 0–100)
pO2, Arterial: 159 mmHg — ABNORMAL HIGH (ref 83.0–108.0)

## 2015-11-21 LAB — GLUCOSE, CAPILLARY
GLUCOSE-CAPILLARY: 109 mg/dL — AB (ref 65–99)
GLUCOSE-CAPILLARY: 110 mg/dL — AB (ref 65–99)
GLUCOSE-CAPILLARY: 118 mg/dL — AB (ref 65–99)
GLUCOSE-CAPILLARY: 123 mg/dL — AB (ref 65–99)
GLUCOSE-CAPILLARY: 128 mg/dL — AB (ref 65–99)
Glucose-Capillary: 139 mg/dL — ABNORMAL HIGH (ref 65–99)

## 2015-11-21 LAB — PHOSPHORUS: PHOSPHORUS: 3.5 mg/dL (ref 2.5–4.6)

## 2015-11-21 LAB — MAGNESIUM: Magnesium: 1.9 mg/dL (ref 1.7–2.4)

## 2015-11-21 LAB — CALCIUM, IONIZED: CALCIUM, IONIZED, SERUM: 4.5 mg/dL (ref 4.5–5.6)

## 2015-11-21 MED ORDER — FAMOTIDINE IN NACL 20-0.9 MG/50ML-% IV SOLN
20.0000 mg | Freq: Two times a day (BID) | INTRAVENOUS | Status: DC
  Administered 2015-11-21: 20 mg via INTRAVENOUS
  Filled 2015-11-21: qty 50

## 2015-11-21 MED ORDER — MAGNESIUM SULFATE 2 GM/50ML IV SOLN
2.0000 g | Freq: Once | INTRAVENOUS | Status: AC
  Administered 2015-11-21: 2 g via INTRAVENOUS
  Filled 2015-11-21: qty 50

## 2015-11-21 MED ORDER — MIDAZOLAM HCL 2 MG/2ML IJ SOLN
1.0000 mg | INTRAMUSCULAR | Status: DC | PRN
  Administered 2015-11-21 – 2015-11-22 (×2): 2 mg via INTRAVENOUS
  Filled 2015-11-21 (×2): qty 2

## 2015-11-21 MED ORDER — ACETAMINOPHEN 160 MG/5ML PO SOLN
650.0000 mg | Freq: Four times a day (QID) | ORAL | Status: DC | PRN
  Administered 2015-11-21 – 2015-11-23 (×7): 650 mg
  Filled 2015-11-21 (×7): qty 20.3

## 2015-11-21 MED ORDER — POTASSIUM CHLORIDE 20 MEQ/15ML (10%) PO SOLN
40.0000 meq | Freq: Two times a day (BID) | ORAL | Status: DC
  Administered 2015-11-21 – 2015-11-24 (×7): 40 meq via ORAL
  Filled 2015-11-21 (×7): qty 30

## 2015-11-21 NOTE — Progress Notes (Signed)
ANTICOAGULATION CONSULT NOTE - Follow Up Consult  Pharmacy Consult for Heparin  Indication: atrial fibrillation  No Known Allergies  Patient Measurements: Height: 5\' 9"  (175.3 cm) Weight: 253 lb 1.4 oz (114.8 kg) IBW/kg (Calculated) : 70.7  Vital Signs: Temp: 101.3 F (38.5 C) (09/01 1815) Temp Source: Axillary (09/01 1815) BP: 175/97 (09/01 1800) Pulse Rate: 102 (09/01 1800)  Labs:  Recent Labs  11/19/15 0344 11/20/15 0411 11/20/15 0830  11/21/15 0220 11/21/15 0405 11/21/15 0555 11/21/15 1000 11/21/15 1748  HGB 12.5* 12.6*  --   --   --  11.2*  --   --   --   HCT 38.1* 38.4*  --   --   --  34.0*  --   --   --   PLT 103* 98*  --   --   --  99*  --   --   --   HEPARINUNFRC  --   --   --   < > <0.10*  --   --  0.17* 0.23*  CREATININE 0.88 0.79 0.80  --   --   --  0.83  --   --   < > = values in this interval not displayed.  Estimated Creatinine Clearance: 118.2 mL/min (by C-G formula based on SCr of 0.83 mg/dL).   Assessment: 60 yo male s/p cardiac arrest with CPR, cooled via the hypothermia protocol - now fully re-warmed. Pharmacy intially consulted to dose heparin for ACS/STEMI and possible PE, however, cath clean and low suspicion for PE. Heparin discontinued 8/29. Now heparin is resumed for new AFib with RVR.   HL remains subtherapeutic on heparin 2300 units/hr. Nurse reports no issues with infusion or bleeding.  Goal of Therapy:  Heparin level 0.3-0.7 units/ml Monitor platelets by anticoagulation protocol: Yes   Plan:  Increase heparin to 2450 units/hr F/u AM heparin level   Bayard Hugger, PharmD, BCPS  Clinical Pharmacist  Pager: [phone]   11/21/2015,6:49 PM

## 2015-11-21 NOTE — Progress Notes (Signed)
ANTICOAGULATION CONSULT NOTE - Follow Up Consult  Pharmacy Consult for Heparin  Indication: atrial fibrillation  No Known Allergies  Patient Measurements: Height: 5\' 9"  (175.3 cm) Weight: 253 lb 1.4 oz (114.8 kg) IBW/kg (Calculated) : 70.7  Vital Signs: Temp: 98.3 F (36.8 C) (09/01 0800) Temp Source: Oral (09/01 0800) BP: 115/64 (09/01 0930) Pulse Rate: 82 (09/01 0700)  Labs:  Recent Labs  11/19/15 0344 11/20/15 0411 11/20/15 0830 11/20/15 1700 11/21/15 0220 11/21/15 0405 11/21/15 0555 11/21/15 1000  HGB 12.5* 12.6*  --   --   --  11.2*  --   --   HCT 38.1* 38.4*  --   --   --  34.0*  --   --   PLT 103* 98*  --   --   --  99*  --   --   HEPARINUNFRC  --   --   --  <0.10* <0.10*  --   --  0.17*  CREATININE 0.88 0.79 0.80  --   --   --  0.83  --     Estimated Creatinine Clearance: 118.2 mL/min (by C-G formula based on SCr of 0.83 mg/dL).   Assessment: 60 yo male s/p cardiac arrest with CPR, cooled via the hypothermia protocol - now fully re-warmed. Pharmacy intially consulted to dose heparin for ACS/STEMI and possible PE, however, cath clean and low suspicion for PE. Heparin discontinued 8/29. Now heparin is resumed for new AFib with RVR. Heparin gtt to start at last rate of 1400 units/hr with no bolus.Watch Hgb.  HL remains subtherapeutic on heparin 2000 units/hr. Nurse reports no issues with infusion or bleeding.  Goal of Therapy:  Heparin level 0.3-0.7 units/ml Monitor platelets by anticoagulation protocol: Yes   Plan:  Increase heparin to 2300 units/hr 6h HL Daily HL Monitor s/sx of bleeding   Arlean Hopping. Newman Pies, PharmD, BCPS Clinical Pharmacist Pager [phone] 11/21/2015,11:16 AM

## 2015-11-21 NOTE — Progress Notes (Signed)
    Subjective:  Intubated, sedated  Objective:  Vital Signs in the last 24 hours: Temp:  [98.3 F (36.8 C)-101.1 F (38.4 C)] 100.3 F (37.9 C) (09/01 1200) Pulse Rate:  [82-99] 88 (09/01 1200) Resp:  [19-35] 22 (09/01 0930) BP: (115-125)/(61-64) 125/64 (09/01 1200) SpO2:  [89 %-100 %] 89 % (09/01 1200) Arterial Line BP: (69-176)/(51-87) 122/58 (09/01 1200) FiO2 (%):  [40 %-60 %] 40 % (09/01 1200) Weight:  [114.8 kg (253 lb 1.4 oz)] 114.8 kg (253 lb 1.4 oz) (09/01 0400)  Intake/Output from previous day: 08/31 0701 - 09/01 0700 In: 2900 [I.V.:1770; NG/GT:1080; IV Piggyback:50] Out: 7400 [Urine:7400]  Physical Exam: Pt is intubated, sedated, eyes open to sternal rub HEENT: normal Neck: JVP - normal, Lungs: CTA bilaterally CV: RRR without murmur or gallop Abd: soft, no mass Ext: no C/C/E, distal pulses intact and equal Skin: warm/dry no rash   Lab Results:  Recent Labs  11/20/15 0411 11/21/15 0405  WBC 11.4* 10.2  HGB 12.6* 11.2*  PLT 98* 99*    Recent Labs  11/20/15 0830 11/21/15 0555  NA 133* 136  K 3.3* 2.9*  CL 92* 92*  CO2 32 33*  GLUCOSE 137* 118*  BUN 22* 25*  CREATININE 0.80 0.83   No results for input(s): TROPONINI in the last 72 hours.  Invalid input(s): CK, MB  Cardiac Studies: FU 2D echo yesterday reviewed LVEF 25-30% severe biventricular dysfunction  Tele: Sinus rhythm  Assessment/Plan:  1. Acute respiratory failure/VDRF - HD#5 2. Out-of-hospital cardiac arrest 3. Acute systolic heart failure with acute cardiogenic shock - improving now on low dose norepi 4. Pulmonary edema  5. VF/nonsustained VT - rhythm now stable 6. Thrombocytopenia - plt stabilized at 99,000 - continue to follow closely. Doubt HIT likely related to critical illness 7. Anoxic encephalopathy secondary to OOH arrest - neurology team following 8. Atrial fibrillation with RVR - has converted back to sinus on IV amiodarone  Hemodynamics improving. Weaning off  pressors (milrinone off and norepi down to 3 mcg). Continue to wean. Ernestine Conrad out yesterday. Continue heparin/IV amiodarone today if remains in sinus rhythm consider change to amiodarone 400 mg BID tomorrow per tube. Continue IV lasix today - has diuresed very well last 48 hours, may need to back off on lasix dose tomorrow.   No family at bedside. Discussed weaning plan with RN at bedside.   Tonny Bollman, M.D. 11/21/2015, 2:11 PM

## 2015-11-21 NOTE — Progress Notes (Signed)
eLink Physician-Brief Progress Note Patient Name: Arthur Chase DOB: 04-08-1955 MRN: [id number]   Date of Service  11/21/2015  HPI/Events of Note  Metabolic and resp acidosis.  eICU Interventions  Reduce RR on vent from 35 to 20.   Peep adjustment as long as sats are 95% or greater to goal of 10     Intervention Category Major Interventions: Acid-Base disturbance - evaluation and management  Florette Thai 11/21/2015, 3:29 AM

## 2015-11-21 NOTE — Progress Notes (Signed)
eLink Physician-Brief Progress Note Patient Name: Arthur Chase DOB: [birth year] MRN: [id number]   Date of Service  11/21/2015  HPI/Events of Note  Temp of 101.  New elevated in WBC but no other changes.  eICU Interventions  Plan: PRN tylenol Hold on ABX for now     Intervention Category Minor Interventions: Routine modifications to care plan (e.g. PRN medications for pain, fever)  Mardel Grudzien 11/21/2015, 12:24 AM

## 2015-11-21 NOTE — Progress Notes (Signed)
eLink Physician-Brief Progress Note Patient Name: Arthur Chase DOB: Jul 14, 1955 MRN: [id number]   Date of Service  11/21/2015  HPI/Events of Note  No stress ulcer prophylaxis  eICU Interventions  Start famotidine     Intervention Category Intermediate Interventions: Best-practice therapies (e.g. DVT, beta blocker, etc.)  [name] 11/21/2015, 8:51 PM

## 2015-11-21 NOTE — Progress Notes (Signed)
Neurology Progress Note  Subjective: Febrile overnight to 101.1 F with slight elevation in WBCs. He remains intubated and sedated with Versed and fentanyl drips. No major change in his neurologic status.  Pertinent medications: Fentanyl drip at 175 mcg/hr Versed drip at 2 mg/hr Norepinephrine drip at 4 mcg/kg/min Amiodarone drip at 60 mg/hr Heparin drip at 2000 units/hr  Current Meds:   Current Facility-Administered Medications:  .  0.9 %  sodium chloride infusion, , Intravenous, Continuous, Alyson Reedy, MD, Last Rate: 10 mL/hr at 11/19/15 1617 .  acetaminophen (TYLENOL) solution 650 mg, 650 mg, Per Tube, Q6H PRN, Zigmund Gottron, MD, 650 mg at 11/21/15 0039 .  amiodarone (NEXTERONE PREMIX) 360-4.14 MG/200ML-% (1.8 mg/mL) IV infusion, 60 mg/hr, Intravenous, Continuous, Tonny Bollman, MD, Last Rate: 33.3 mL/hr at 11/21/15 0611, 60 mg/hr at 11/21/15 0611 .  bisacodyl (DULCOLAX) suppository 10 mg, 10 mg, Rectal, Daily PRN, Jeanella Craze, NP .  cefTRIAXone (ROCEPHIN) 2 g in dextrose 5 % 50 mL IVPB, 2 g, Intravenous, Q24H, Lupita Leash, MD, 2 g at 11/20/15 1720 .  chlorhexidine (PERIDEX) 0.12 % solution 15 mL, 15 mL, Mouth Rinse, BID, Lupita Leash, MD, 15 mL at 11/20/15 2000 .  docusate (COLACE) 50 MG/5ML liquid 100 mg, 100 mg, Per Tube, Daily, Jeanella Craze, NP, 100 mg at 11/20/15 0919 .  feeding supplement (PRO-STAT SUGAR FREE 64) liquid 60 mL, 60 mL, Per Tube, BID, Alyson Reedy, MD, 60 mL at 11/20/15 2149 .  feeding supplement (VITAL HIGH PROTEIN) liquid 1,000 mL, 1,000 mL, Per Tube, Q24H, Alyson Reedy, MD, 1,000 mL at 11/20/15 1622 .  fentaNYL (SUBLIMAZE) 2,500 mcg in sodium chloride 0.9 % 250 mL (10 mcg/mL) infusion, 25-400 mcg/hr, Intravenous, Continuous, Jeanella Craze, NP, Last Rate: 17.5 mL/hr at 11/21/15 0510, 175 mcg/hr at 11/21/15 0510 .  fentaNYL (SUBLIMAZE) bolus via infusion 50 mcg, 50 mcg, Intravenous, Q1H PRN, Jeanella Craze, NP .  fentaNYL (SUBLIMAZE)  injection 50 mcg, 50 mcg, Intravenous, Once, Jeanella Craze, NP .  furosemide (LASIX) injection 80 mg, 80 mg, Intravenous, Q8H, Tonny Bollman, MD, 80 mg at 11/21/15 0004 .  heparin ADULT infusion 100 units/mL (25000 units/267mL sodium chloride 0.45%), 2,000 Units/hr, Intravenous, Continuous, Stevphen Rochester, RPH, Last Rate: 20 mL/hr at 11/21/15 0333, 2,000 Units/hr at 11/21/15 0333 .  insulin aspart (novoLOG) injection 0-9 Units, 0-9 Units, Subcutaneous, Q4H, Jeanella Craze, NP, 1 Units at 11/21/15 0401 .  MEDLINE mouth rinse, 15 mL, Mouth Rinse, 10 times per day, Lupita Leash, MD, 15 mL at 11/21/15 0600 .  midazolam (VERSED) 50 mg in sodium chloride 0.9 % 50 mL (1 mg/mL) infusion, 0-10 mg/hr, Intravenous, Continuous, Jeanella Craze, NP, Last Rate: 2 mL/hr at 11/20/15 2000, 2 mg/hr at 11/20/15 2000 .  midazolam (VERSED) bolus via infusion 1-2 mg, 1-2 mg, Intravenous, Q2H PRN, Jeanella Craze, NP .  milrinone (PRIMACOR) 20 MG/100 ML (0.2 mg/mL) infusion, 0.125 mcg/kg/min, Intravenous, Continuous, Tonny Bollman, MD, Stopped at 11/20/15 1300 .  multivitamin liquid 15 mL, 15 mL, Per Tube, Daily, Alyson Reedy, MD, 15 mL at 11/20/15 0919 .  norepinephrine (LEVOPHED) 16 mg in dextrose 5 % 250 mL (0.064 mg/mL) infusion, 2-100 mcg/min, Intravenous, Continuous, Lupita Leash, MD, Last Rate: 3.8 mL/hr at 11/21/15 0650, 4 mcg/min at 11/21/15 0650 .  potassium chloride 20 MEQ/15ML (10%) solution 40 mEq, 40 mEq, Oral, BID, Tonny Bollman, MD, 40 mEq at 11/20/15 2150 .  sodium chloride  flush (NS) 0.9 % injection 10-40 mL, 10-40 mL, Intracatheter, Q12H, Lupita Leash, MD, 10 mL at 11/20/15 2150 .  sodium chloride flush (NS) 0.9 % injection 10-40 mL, 10-40 mL, Intracatheter, PRN, Lupita Leash, MD  Objective:  Temp:  [99.4 F (37.4 C)-101.1 F (38.4 C)] 99.4 F (37.4 C) (09/01 0400) Pulse Rate:  [82-126] 82 (09/01 0700) Resp:  [19-35] 20 (09/01 0700) BP: (98-115)/(57-61) 115/61 (08/31  2023) SpO2:  [95 %-100 %] 95 % (09/01 0700) Arterial Line BP: (69-176)/(51-87) 114/61 (09/01 0700) FiO2 (%):  [50 %-60 %] 50 % (09/01 0400) Weight:  [114.8 kg (253 lb 1.4 oz)] 114.8 kg (253 lb 1.4 oz) (09/01 0400)  General: WDWN Caucasian man intubated and sedated in the ICU. He does not respond to verbal, tactile, or noxious stimuli. He has no spontaneous eye opening but partly opens eyes to pain. He is not following any commands.   HEENT: Neck is supple without lymphadenopathy. ETT and OGT in place. Sclerae are anicteric with mild edema. There is mild conjunctival injection.  CV: Regular, no murmur.  Lungs: CTAB on anterior exam.  Neuro: MS: As noted above. No aphasia.  CN: Pupils are equal and reactive from 2-->1 mm bilaterally. He does not clearly blink to visual threat. His eyes are conjugate. Oculocephalics are present. Corneals are intact. His face appears grossly symmetric but the bottom portion is obscured by tubes and tape. There is no response to supraorbital pressure on either side. The remainder of the exam is limited by his encephalopathy. Motor: Normal bulk. Tone is reduced throughout. No spontaneous movement is seen. No tremor or other abnormal movements are observed.  Sensation: He has flexion withdrawal to nailbed pressure in all extremities, left better than right. He appears to localize to pain on the left.  DTRs: 2+ on the left, 3+ on the right with sustained clonus at both ankles. Toes are mute bilaterally.  Coordination and gait: These cannot be assessed as the patient is unable to participate with the exam.   Labs: Lab Results  Component Value Date   WBC 10.2 11/21/2015   HGB 11.2 (L) 11/21/2015   HCT 34.0 (L) 11/21/2015   PLT 99 (L) 11/21/2015   GLUCOSE 118 (H) 11/21/2015   ALT 43 11/19/2015   AST 45 (H) 11/19/2015   NA 136 11/21/2015   K 2.9 (L) 11/21/2015   CL 92 (L) 11/21/2015   CREATININE 0.83 11/21/2015   BUN 25 (H) 11/21/2015   CO2 33 (H) 11/21/2015    TSH 1.597 11/18/2015   INR 1.19 11/17/2015   CBC Latest Ref Rng & Units 11/21/2015 11/20/2015 11/19/2015  WBC 4.0 - 10.5 K/uL 10.2 11.4(H) 9.7  Hemoglobin 13.0 - 17.0 g/dL 11.2(L) 12.6(L) 12.5(L)  Hematocrit 39.0 - 52.0 % 34.0(L) 38.4(L) 38.1(L)  Platelets 150 - 400 K/uL 99(L) 98(L) 103(L)    No results found for: HGBA1C Lab Results  Component Value Date   ALT 43 11/19/2015   AST 45 (H) 11/19/2015   ALKPHOS 40 11/19/2015   BILITOT 0.4 11/19/2015   Phos 3.5 Mag 1.9  Radiology: There is no new neuroimaging for review.  A/P:   1. Anoxic brain injury: This is acute, due to out of hospital cardiac arrest on 8/27 with first identified rhythm ventricular fibrillation. Total downtime is unknown with documented resuscitation time after EMS arrived of 16 minutes. He has now day #3 after completing the therapeutic hypothermia protocol, as he reached normothermia at approximately noon on 11/18/15. Treatment is  supportive, as noted below.  2. Anoxic encephalopathy: This is acute, due to anoxic brain injury from cardiac arrest. Brainstem remains intact with some localization to pain. On previous exam he was noted to lift his arms nonpurposefully.These would suggest a favorable prognosis for meaningful neurologic recovery at this point. On today's exam he has more pronounce asymmetry of reflexes and pain response with brisker DTRs and apparent less response to pain on the right side. Will order CTH today--any insult from anoxia or possible infarction should be apparent by now. Continue with supportive care. Avoid hypoxemia and hypotension for even brief intervals as these are associated with worse neurologic outcomes. Fever and hyperglycemia must be aggressively treated for the same reason. We will continue to follow the exam to aid with neurologic prognosis.   3. Possible right hemiparesis: He has brisker DTRs on the right and appears to be less responsive to pain on the right as well. He has, however,  been observed to lift the right arm off the bed spontaneously. I will check CTH as above to exclude possible stroke.   This patient is critically ill and at significant risk of neurological worsening, death and care requires constant monitoring of vital signs, hemodynamics,respiratory and cardiac monitoring, neurological assessment, discussion with family, other specialists and medical decision making of high complexity. A total of 35 minutes of critical care time was spent on this case.   Rhona Leavensimothy Seanpaul Preece, MD [facility]

## 2015-11-21 NOTE — Progress Notes (Signed)
Vent changes made after ABG resulted... Rate to 20 peep 10 as long a Spo2 is 95 or greater and titrate fio2 to keep Spo2 greater than 92. Per MD order. Changes made and pt tolerating well

## 2015-11-21 NOTE — Progress Notes (Signed)
Pt.  Started vomiting. Suctioned. Tube feeds stopped. Fentanyl restarted at 100

## 2015-11-21 NOTE — Progress Notes (Signed)
ANTICOAGULATION CONSULT NOTE - Follow Up Consult  Pharmacy Consult for Heparin  Indication: atrial fibrillation  No Known Allergies  Patient Measurements: Height: 5\' 9"  (175.3 cm) Weight: 255 lb 4.7 oz (115.8 kg) IBW/kg (Calculated) : 70.7  Vital Signs: Temp: 101.1 F (38.4 C) (09/01 0000) Temp Source: Oral (09/01 0000) BP: 115/61 (08/31 2023) Pulse Rate: 83 (09/01 0215)  Labs:  Recent Labs  11/18/15 0356  11/18/15 1011 11/19/15 0344 11/20/15 0411 11/20/15 0830 11/20/15 1700 11/21/15 0220  HGB 15.7  --   --  12.5* 12.6*  --   --   --   HCT 45.3  --   --  38.1* 38.4*  --   --   --   PLT 166  --   --  103* 98*  --   --   --   HEPARINUNFRC  --   --  0.36  --   --   --  <0.10* <0.10*  CREATININE 0.82  < >  --  0.88 0.79 0.80  --   --   < > = values in this interval not displayed.  Estimated Creatinine Clearance: 123.2 mL/min (by C-G formula based on SCr of 0.8 mg/dL).   Assessment: 60 yo male s/p cardiac arrest with CPR, cooled via the hypothermia protocol - now fully re-warmed. Pharmacy intially consulted to dose heparin for ACS/STEMI and possible PE, however, cath clean and low suspicion for PE. Heparin discontinued 8/29. Now heparin is resumed for new AFib with RVR. Heparin gtt to start at last rate of 1400 units/hr with no bolus.Watch Hgb.  HL in undetectable this AM, no issues per RN.   Goal of Therapy:  Heparin level 0.3-0.7 units/ml Monitor platelets by anticoagulation protocol: Yes   Plan:  -Inc heparin to 2000 units/hr -1200 HL  Abran Duke 11/21/2015,3:25 AM

## 2015-11-21 NOTE — Procedures (Signed)
CVP line, A-line, and dressing changed without complications.

## 2015-11-21 NOTE — Progress Notes (Signed)
PULMONARY / CRITICAL CARE MEDICINE   Name: Arthur Chase MRN: [id number] DOB: [birth year]    ADMISSION DATE:  11/16/2015 CONSULTATION DATE:  11/16/2015  REFERRING MD:  Dr. Peter Swaziland  CHIEF COMPLAINT:  Cardiac arrest  HISTORY OF PRESENT ILLNESS:   60 year old male with obesity and hypertension was brought to the [facility] emergency department on 11/16/2015 after a cardiac arrest at home. His children provide history as he was comatose on our evaluation. They state that he had been in his usual state of health but on 11/16/2015 they could hear him breathing heavily from another room. They came immediately found him to be unresponsive. They called 911 and started CPR. The fire department arrived nearly immediately and her vital shock via AED and CPR. Paramedics arrived and found him to be in V. fib. Total time of CPR is estimated to be approximately 16 minutes. En route he had V. fib on 2 other occasions which were successfully defibrillated. In the emergency department he was noted to be agitated while intubated. He was taken to the Cath Lab. In the Cath Lab he was noted to have clean coronary arteries and a normal left ventricular ejection fraction. Left ventricular end-diastolic pressure was 18.  SUBJECTIVE:   Off milrinone.  On fent/versed, heparin, levo + amio.  RN reports episodes of desaturation, thick secretions from ETT.  RT bag-lavaged earlier in am.   Tmax 100.3, net gegative 1.5L in last 24 hours  VITAL SIGNS: BP 125/64 (BP Location: Right Arm)   Pulse 88   Temp 100.3 F (37.9 C) (Oral)   Resp (!) 22   Ht 5\' 9"  (1.753 m)   Wt 253 lb 1.4 oz (114.8 kg)   SpO2 (!) 89%   BMI 37.37 kg/m   HEMODYNAMICS: CVP:  [12 mmHg-14 mmHg] 12 mmHg  VENTILATOR SETTINGS: Vent Mode: PRVC FiO2 (%):  [40 %-60 %] 40 % Set Rate:  [20 bmp-35 bmp] 20 bmp Vt Set:  [530 mL-630 mL] 530 mL PEEP:  [5 cmH20-14 cmH20] 5 cmH20 Plateau Pressure:  [17 cmH20-29 cmH20] 17 cmH20  INTAKE /  OUTPUT: I/O last 3 completed shifts: In: 5093.2 [I.V.:2838.2; NG/GT:1665; IV Piggyback:135] Out: 67124 [Urine:11400]  PHYSICAL EXAMINATION: General:  obese male in NAD on ventilator. Neuro: Sedate, localizes to pain, opens eyes with physical stimulation HEENT:  Normocephalic atraumatic, endotracheal tube in place, thick brown secretions from ETT. Cardiovascular:  Regular rate and rhythm, no murmurs gallops or rubs, 2+ radial pulses Lungs:  even/non-labored, lungs bilaterally distant but coarse  Abdomen:  Obese abdomen, nontender nondistended Musculoskeletal:  Normal bulk and tone Skin:  warm/dry, generalized edema   LABS:  BMET  Recent Labs Lab 11/20/15 0411 11/20/15 0830 11/21/15 0555  NA 134* 133* 136  K 3.4* 3.3* 2.9*  CL 92* 92* 92*  CO2 33* 32 33*  BUN 21* 22* 25*  CREATININE 0.79 0.80 0.83  GLUCOSE 132* 137* 118*   Electrolytes  Recent Labs Lab 11/19/15 1638 11/20/15 0411 11/20/15 0830 11/21/15 0555  CALCIUM  --  8.2* 8.0* 8.1*  MG 1.9 1.9  --  1.9  PHOS 1.8* 1.9*  --  3.5   CBC  Recent Labs Lab 11/19/15 0344 11/20/15 0411 11/21/15 0405  WBC 9.7 11.4* 10.2  HGB 12.5* 12.6* 11.2*  HCT 38.1* 38.4* 34.0*  PLT 103* 98* 99*   Coag's  Recent Labs Lab 11/17/15 0216 11/17/15 1032  APTT 34 41*  INR 1.16 1.19   Sepsis Markers  Recent Labs Lab  11/16/15 2143 11/17/15 0218  LATICACIDVEN 11.85* 3.2*   ABG  Recent Labs Lab 11/19/15 0345 11/20/15 0406 11/21/15 0322  PHART 7.371 7.450 7.590*  PCO2ART 53.5* 51.4* 42.4  PO2ART 100 57.0* 159.0*    Liver Enzymes  Recent Labs Lab 11/17/15 1032 11/19/15 0344  AST 108* 45*  ALT 79* 43  ALKPHOS 44 40  BILITOT 0.5 0.4  ALBUMIN 3.3* 2.5*    Cardiac Enzymes  Recent Labs Lab 11/17/15 0510 11/17/15 1032 11/17/15 1730  TROPONINI 1.83* 1.67* 0.89*   Glucose  Recent Labs Lab 11/20/15 1726 11/20/15 1949 11/21/15 0009 11/21/15 0359 11/21/15 0807 11/21/15 1217  GLUCAP 127* 131*  109* 123* 118* 128*   Imaging Dg Chest Port 1 View  Result Date: 11/21/2015 CLINICAL DATA:  Intubation. EXAM: PORTABLE CHEST 1 VIEW COMPARISON:  11/20/2015. FINDINGS: Interim removal Swan-Ganz catheter. Endotracheal tube, NG tube, left IJ line and stable position. Stable cardiomegaly. Left lower lobe infiltrate. No pleural effusion or pneumothorax. Prior cervical spine fusion. IMPRESSION: 1. Interim removal of Swan-Ganz catheter. Remaining lines and tubes in stable position. 2.  Stable cardiomegaly with mild pulmonary venous congestion. 3. Improving bibasilar atelectasis. Persistent infiltrate left lower lobe. Electronically Signed   By: Maisie Fus  Register   On: 11/21/2015 06:47   STUDIES:  8/27  LHC >> clean coronary arteries, normal LVEDP, normal LV function 8/28  CT head >> no acute intracranial abnormalites; fluid in the paranasal sinuses  8/28  TTE >> suboptimal study, LVEF <20%, severe global hypokinesis, mod LVH, bradycardia with PVCs, normal LA size, mild TR, PAP , trivial posterior pericardial effusion  CULTURES: 8/27 Sputum >> multiple organisms present, none predominant   ANTIBIOTICS: Unasyn 8/27 >> 8/28 Rocephin 8/28 >>   SIGNIFICANT EVENTS: 8/27 Admit with VF arrest 8/28  Swan placed to 57 cm, PA pressure 28/13.  Rewarmed to normothermia  9/01  Remains sedate, periods of desaturation / thick secretions  LINES/TUBES: ETT 8/27 >> L IJ TLC 8/27 >>  R IJ Cordis 8/28 >> Swan 8/28 >> 8/31  DISCUSSION: 60 year old male with a past medical history significant for obesity and hypertension was admitted 8/27 with a VF cardiac arrest. No evidence of coronary artery disease based on left heart catheterization. Later found to have LVEF of <20% on TTE (suboptimal study).  Differential diagnosis of his cardiac arrest includes a primary cardiac arrhythmia (most likely) versus less likely an acute CNS event or a pulmonary embolism.  ASSESSMENT / PLAN:  PULMONARY A: Acute Hypoxic  Respiratory Failure  Possible Aspiration Pneumonia / LLL Infiltrate Rule Out Acute Pulmonary Embolism ARDS P:   PRVC 8cc/kg  Wean PEEP / FiO2 for sats >92% Trend CXR / ABG PRN See ID Lasix as below VAP prevention measures    CARDIOVASCULAR A:  V. fib Arrest - out of hospital cardiac arrest  Cardiogenic Shock Acute Systolic CHF  AF with RVR  P:  Consider repeat ECHO to reassess LV function, defer timing to Cards Amiodarone per Cardiology  Tele monitoring  Heparin gtt per pharmacy  Lasix 80 mg IV Q8  Levophed for MAP > 65    RENAL A:   Hypokalemia Metabolic Acidosis  Hypomagnesemia  P:   Monitor BMET and UOP Replace electrolytes as needed Lasix 80 mg IV q8 + KCL 40 mEq BID Mg 2gm IV x1   GASTROINTESTINAL A:   At Risk Protein Calorie Malnutrition  P:   OG tube TF per nutrition  Pepcid for stress ulcer prophylaxis Colace + bisacodyl for bowel regimen  HEMATOLOGIC A:   Low suspicion for PE Thrombocytopenia  P:  Monitor for bleeding Heparin gtt per pharmacy  SCD's + heparin for DVT prophylaxis   INFECTIOUS A:   Possible aspiration pneumonia P:   Follow cultures Continue rocephin    ENDOCRINE A:   Hyperglycemia  P:   Monitor glucose SSI, sensitive scale  NEUROLOGIC A:   Concern for Anoxic Injury - in setting of cardiac arrest  Hx Back Surgery with Implanted Stimulator P:   RASS goal: 0 to -1 Continue fentanyl gtt for pain  Wean versed gtt to off  Minimize sedation to allow for neuro assessment  Daily WUA / SBT   FAMILY  - Updates: No family at bedside am 9/1.    - Inter-disciplinary family meet or Palliative Care meeting due by:  Day 7   Canary BrimBrandi Ollis, NP-C [place] [facility] Pgr: [phone] or if no answer [phone] 11/21/2015, 12:43 PM  Attending Note:  60 year old male with very little medical history who suffered a cardiac arrest and underwent cooling protocol.  Patient localizes on exam but arousable.  I reviewed  CXR myself, pulmonary edema noted.  Echo noted, continue diureses, hold off weaning for now but decrease PEEP to 8 and FiO2 to 40%.  Maintain full vent support.  No family bedside.  Continue diureses.  The patient is critically ill with multiple organ systems failure and requires high complexity decision making for assessment and support, frequent evaluation and titration of therapies, application of advanced monitoring technologies and extensive interpretation of multiple databases.   Critical Care Time devoted to patient care services described in this note is  35  Minutes. This time reflects time of care of this signee Dr Koren BoundWesam Yacoub. This critical care time does not reflect procedure time, or teaching time or supervisory time of PA/NP/Med student/Med Resident etc but could involve care discussion time.  Alyson ReedyWesam G. Yacoub, M.D. [facility]. Pager: [phone]. After hours pager: [phone].

## 2015-11-22 ENCOUNTER — Inpatient Hospital Stay (HOSPITAL_COMMUNITY): Payer: Managed Care, Other (non HMO)

## 2015-11-22 ENCOUNTER — Encounter (HOSPITAL_COMMUNITY): Payer: Self-pay | Admitting: Radiology

## 2015-11-22 LAB — BASIC METABOLIC PANEL
ANION GAP: 13 (ref 5–15)
BUN: 25 mg/dL — ABNORMAL HIGH (ref 6–20)
CO2: 34 mmol/L — ABNORMAL HIGH (ref 22–32)
Calcium: 8.9 mg/dL (ref 8.9–10.3)
Chloride: 89 mmol/L — ABNORMAL LOW (ref 101–111)
Creatinine, Ser: 0.88 mg/dL (ref 0.61–1.24)
GFR calc Af Amer: >60 mL/min (ref >60)
Glucose, Bld: 115 mg/dL — ABNORMAL HIGH (ref 65–99)
POTASSIUM: 2.9 mmol/L — AB (ref 3.5–5.1)
SODIUM: 136 mmol/L (ref 135–145)

## 2015-11-22 LAB — CBC
HEMATOCRIT: 36.1 % — AB (ref 39.0–52.0)
HEMOGLOBIN: 11.8 g/dL — AB (ref 13.0–17.0)
MCH: 31.6 pg (ref 26.0–34.0)
MCHC: 32.7 g/dL (ref 30.0–36.0)
MCV: 96.8 fL (ref 78.0–100.0)
Platelets: 142 10*3/uL — ABNORMAL LOW (ref 150–400)
RBC: 3.73 MIL/uL — ABNORMAL LOW (ref 4.22–5.81)
RDW: 13.6 % (ref 11.5–15.5)
WBC: 8.3 10*3/uL (ref 4.0–10.5)

## 2015-11-22 LAB — GLUCOSE, CAPILLARY
GLUCOSE-CAPILLARY: 131 mg/dL — AB (ref 65–99)
Glucose-Capillary: 124 mg/dL — ABNORMAL HIGH (ref 65–99)
Glucose-Capillary: 126 mg/dL — ABNORMAL HIGH (ref 65–99)
Glucose-Capillary: 129 mg/dL — ABNORMAL HIGH (ref 65–99)
Glucose-Capillary: 155 mg/dL — ABNORMAL HIGH (ref 65–99)

## 2015-11-22 LAB — BLOOD GAS, ARTERIAL
Acid-Base Excess: 10.5 mmol/L — ABNORMAL HIGH (ref 0.0–2.0)
Bicarbonate: 34.4 mmol/L — ABNORMAL HIGH (ref 20.0–28.0)
Drawn by: 244851
FIO2: 50
LHR: 16 {breaths}/min
O2 SAT: 96.4 %
PCO2 ART: 44.1 mmHg (ref 32.0–48.0)
PEEP/CPAP: 10 cmH2O
Patient temperature: 98.6
Pressure control: 20 cmH2O
pH, Arterial: 7.503 — ABNORMAL HIGH (ref 7.350–7.450)
pO2, Arterial: 80.7 mmHg — ABNORMAL LOW (ref 83.0–108.0)

## 2015-11-22 LAB — MAGNESIUM: MAGNESIUM: 2.2 mg/dL (ref 1.7–2.4)

## 2015-11-22 LAB — HEPARIN LEVEL (UNFRACTIONATED)
HEPARIN UNFRACTIONATED: 0.47 [IU]/mL (ref 0.30–0.70)
Heparin Unfractionated: 0.24 IU/mL — ABNORMAL LOW (ref 0.30–0.70)
Heparin Unfractionated: 0.11 IU/mL — ABNORMAL LOW (ref 0.30–0.70)

## 2015-11-22 LAB — PHOSPHORUS: PHOSPHORUS: 3.9 mg/dL (ref 2.5–4.6)

## 2015-11-22 MED ORDER — IOPAMIDOL (ISOVUE-370) INJECTION 76%
INTRAVENOUS | Status: AC
  Filled 2015-11-22: qty 100

## 2015-11-22 MED ORDER — FENTANYL CITRATE (PF) 100 MCG/2ML IJ SOLN
100.0000 ug | INTRAMUSCULAR | Status: DC | PRN
  Administered 2015-11-22 (×3): 100 ug via INTRAVENOUS
  Filled 2015-11-22 (×4): qty 2

## 2015-11-22 MED ORDER — FENTANYL BOLUS VIA INFUSION
25.0000 ug | INTRAVENOUS | Status: DC | PRN
  Administered 2015-11-22: 50 ug via INTRAVENOUS
  Administered 2015-11-23: 25 ug via INTRAVENOUS
  Administered 2015-11-23 (×2): 50 ug via INTRAVENOUS
  Administered 2015-11-23: 25 ug via INTRAVENOUS
  Administered 2015-11-23: 50 ug via INTRAVENOUS
  Filled 2015-11-22: qty 50

## 2015-11-22 MED ORDER — PANTOPRAZOLE SODIUM 40 MG PO PACK
40.0000 mg | PACK | ORAL | Status: DC
  Administered 2015-11-22 – 2015-11-24 (×3): 40 mg
  Filled 2015-11-22 (×3): qty 20

## 2015-11-22 MED ORDER — POTASSIUM CHLORIDE 20 MEQ/15ML (10%) PO SOLN
40.0000 meq | ORAL | Status: AC
  Administered 2015-11-22 (×3): 40 meq
  Filled 2015-11-22 (×3): qty 30

## 2015-11-22 MED ORDER — TOBRAMYCIN 0.3 % OP SOLN
1.0000 [drp] | Freq: Four times a day (QID) | OPHTHALMIC | Status: DC
  Administered 2015-11-22 – 2015-12-03 (×42): 1 [drp] via OPHTHALMIC
  Filled 2015-11-22: qty 5

## 2015-11-22 MED ORDER — HEPARIN BOLUS VIA INFUSION
3000.0000 [IU] | Freq: Once | INTRAVENOUS | Status: AC
  Administered 2015-11-22: 3000 [IU] via INTRAVENOUS
  Filled 2015-11-22: qty 3000

## 2015-11-22 MED ORDER — MIDAZOLAM HCL 2 MG/2ML IJ SOLN
2.0000 mg | INTRAMUSCULAR | Status: DC | PRN
  Administered 2015-11-22 – 2015-11-24 (×10): 2 mg via INTRAVENOUS
  Filled 2015-11-22 (×10): qty 2

## 2015-11-22 MED ORDER — POTASSIUM CHLORIDE 20 MEQ/15ML (10%) PO SOLN
40.0000 meq | Freq: Once | ORAL | Status: AC
  Administered 2015-11-22: 40 meq
  Filled 2015-11-22: qty 30

## 2015-11-22 MED ORDER — FUROSEMIDE 10 MG/ML IJ SOLN
80.0000 mg | Freq: Two times a day (BID) | INTRAMUSCULAR | Status: DC
Start: 2015-11-22 — End: 2015-11-23
  Administered 2015-11-22: 80 mg via INTRAVENOUS
  Filled 2015-11-22: qty 8

## 2015-11-22 MED ORDER — SODIUM CHLORIDE 0.9 % IV SOLN
25.0000 ug/h | INTRAVENOUS | Status: DC
  Administered 2015-11-23: 200 ug/h via INTRAVENOUS
  Administered 2015-11-23: 25 ug/h via INTRAVENOUS
  Administered 2015-11-23: 300 ug/h via INTRAVENOUS
  Filled 2015-11-22 (×4): qty 50

## 2015-11-22 MED ORDER — DOCUSATE SODIUM 50 MG/5ML PO LIQD: 100.0000 mg | Freq: Every day | ORAL | Status: DC | PRN

## 2015-11-22 NOTE — Progress Notes (Signed)
Patient's O2 sat 88-87%. Patient suctioned without changes in  O2 sat.  RT notified, vent settings changed, and Dr. Darrick Penna notified. Patient has order for CT of the head, unable to transport at this time because of unstable respiratory status. Dr. Darrick Penna aware and okay with holding CT of the head at this time. Will continue to monitor.

## 2015-11-22 NOTE — Progress Notes (Signed)
    Subjective:   Remains intubated. Off sedation and pressors. No purposeful movements. Neuro concerned for severe anoxic encephalopathy and possible hemiparesis.   Remains febrile and hypertensive with stimulation. CVP 8   Scheduled Meds: . cefTRIAXone (ROCEPHIN)  IV  2 g Intravenous Q24H  . chlorhexidine  15 mL Mouth Rinse BID  . feeding supplement (PRO-STAT SUGAR FREE 64)  60 mL Per Tube BID  . feeding supplement (VITAL HIGH PROTEIN)  1,000 mL Per Tube Q24H  . furosemide  80 mg Intravenous Q12H  . insulin aspart  0-9 Units Subcutaneous Q4H  . mouth rinse  15 mL Mouth Rinse 10 times per day  . multivitamin  15 mL Per Tube Daily  . pantoprazole sodium  40 mg Per Tube Q24H  . potassium chloride  40 mEq Oral BID  . potassium chloride  40 mEq Per Tube Q4H  . sodium chloride flush  10-40 mL Intracatheter Q12H   Continuous Infusions: . sodium chloride 10 mL/hr at 11/19/15 1617  . amiodarone 60 mg/hr (11/22/15 0600)  . heparin 2,750 Units/hr (11/22/15 0953)   PRN Meds:.acetaminophen (TYLENOL) oral liquid 160 mg/5 mL, bisacodyl, docusate, fentaNYL (SUBLIMAZE) injection, midazolam, sodium chloride flush   Objective:  Vital Signs in the last 24 hours: Temp:  [97.8 F (36.6 C)-101.3 F (38.5 C)] 101.3 F (38.5 C) (09/02 1129) Pulse Rate:  [81-102] 95 (09/02 1129) Resp:  [17-44] 21 (09/02 1129) BP: (120-205)/(70-97) 205/81 (09/02 0815) SpO2:  [90 %-99 %] 93 % (09/02 1129) Arterial Line BP: (119-242)/(53-100) 135/60 (09/02 1000) FiO2 (%):  [40 %-70 %] 50 % (09/02 1129) Weight:  [107.2 kg (236 lb 5.3 oz)] 107.2 kg (236 lb 5.3 oz) (09/02 0500)  Intake/Output from previous day: 09/01 0701 - 09/02 0700 In: 3224 [I.V.:2049; NG/GT:1125; IV Piggyback:50] Out: 8984 [Urine:6725]  Physical Exam: Pt is intubated, sedated  HEENT: normal x for ETT Neck: JVP - normal, Lungs: CTA bilaterally CV: RRR without murmur or gallop Abd: soft, no mass + distended +hypoactive BS Ext: no C/C/E,  distal pulses intact and equal Skin: warm/dry no rash   Lab Results:  Recent Labs  11/21/15 0405 11/22/15 0428  WBC 10.2 8.3  HGB 11.2* 11.8*  PLT 99* 142*    Recent Labs  11/21/15 0555 11/22/15 0428  NA 136 136  K 2.9* 2.9*  CL 92* 89*  CO2 33* 34*  GLUCOSE 118* 115*  BUN 25* 25*  CREATININE 0.83 0.88   No results for input(s): TROPONINI in the last 72 hours.  Invalid input(s): CK, MB  Cardiac Studies: echo 8/31 LVEF 25-30% severe biventricular dysfunction  Tele: Sinus rhythm/tach ~100  Assessment/Plan:  1. Acute respiratory failure/VDRF 2. Out-of-hospital cardiac arrest 3. Acute systolic heart failure with acute cardiogenic shock 4. Pulmonary edema  5. VF/nonsustained VT - rhythm now stable 6. Anoxic encephalopathy secondary to OOH arrest - neurology team following 7. Atrial fibrillation with RVR - has converted back to sinus on IV amiodarone 8. Hypokalemia  Relatively stable from cardiac perspective. CVP 8.  Rhythm stable on amio. Continue IV lasix and amio. Agree with concern for severe anoxic brain injury. Supp K+   Critical Care Time devoted to patient care services described in this note is 35 Minutes.   Arvilla Meres, M.D. 11/22/2015, 12:26 PM

## 2015-11-22 NOTE — Progress Notes (Signed)
ANTICOAGULATION CONSULT NOTE - Follow Up Consult  Pharmacy Consult for Heparin  Indication: atrial fibrillation  No Known Allergies  Patient Measurements: Height: 5\' 9"  (175.3 cm) Weight: 253 lb 1.4 oz (114.8 kg) IBW/kg (Calculated) : 70.7  Vital Signs: Temp: 97.8 F (36.6 C) (09/02 0300) Temp Source: Oral (09/02 0300) BP: 131/70 (09/02 0300) Pulse Rate: 81 (09/02 0300)  Labs:  Recent Labs  11/20/15 0411 11/20/15 0830  11/21/15 0405 11/21/15 0555 11/21/15 1000 11/21/15 1748 11/22/15 0428 11/22/15 0429  HGB 12.6*  --   --  11.2*  --   --   --  11.8*  --   HCT 38.4*  --   --  34.0*  --   --   --  36.1*  --   PLT 98*  --   --  99*  --   --   --  142*  --   HEPARINUNFRC  --   --   < >  --   --  0.17* 0.23*  --  0.24*  CREATININE 0.79 0.80  --   --  0.83  --   --  0.88  --   < > = values in this interval not displayed.  Estimated Creatinine Clearance: 111.5 mL/min (by C-G formula based on SCr of 0.88 mg/dL).   Assessment: 60 yo male s/p cardiac arrest and hypothermia protocol, now with new AFib, for heparin  Goal of Therapy:  Heparin level 0.3-0.7 units/ml Monitor platelets by anticoagulation protocol: Yes   Plan:  Heparin 3000 units IV bolus, then increase heparin 2750 units/hr Check heparin level in 6 hours.   Geannie Risen, PharmD, BCPS  11/22/2015,5:28 AM

## 2015-11-22 NOTE — Progress Notes (Signed)
eLink Physician-Brief Progress Note Patient Name: Arthur Chase DOB: March 20, 1956 MRN: [id number]   Date of Service  11/22/2015  HPI/Events of Note  Hypokalemia in the setting on ongoing potassium replacement  eICU Interventions  Additional potassium ordered        DETERDING,ELIZABETH 11/22/2015, 5:26 AM

## 2015-11-22 NOTE — Progress Notes (Signed)
eLink Physician-Brief Progress Note Patient Name: Arthur Chase DOB: 1955-07-02 MRN: [id number]   Date of Service  11/22/2015  HPI/Events of Note  Notified by bedside nurse and patient's increased agitation and coughing/gagging on his endotracheal tube resulting in emesis. Patient also notably has marked erythema in his right eye. Camera check confirms this. No evidence of purulent drainage. Eyes are open. Review of rounding note shows patient transitioned to intermittent Versed and fentanyl pushes.   eICU Interventions  1. Starting tobramycin 1 drop left eye every 6 hours 2. Switching back to fentanyl infusion with goal pain relief with intermittent fentanyl boluses via infusion      Intervention Category Intermediate Interventions: Pain - evaluation and management;Other:  Lawanda Cousins 11/22/2015, 7:27 PM

## 2015-11-22 NOTE — Progress Notes (Signed)
PULMONARY / CRITICAL CARE MEDICINE   Name: Kennith MaesGarrett D Stump MRN: [id number] DOB: 04/24/1955    ADMISSION DATE:  11/16/2015 CONSULTATION DATE:  11/16/2015  REFERRING MD:  Dr. Peter SwazilandJordan  CHIEF COMPLAINT:  Cardiac arrest  SUBJECTIVE:    Off pressors.  Vomited last night.  VITAL SIGNS: BP (!) 158/89 (BP Location: Right Arm)   Pulse 86   Temp 100.1 F (37.8 C) (Axillary)   Resp (!) 22   Ht 5\' 9"  (1.753 m)   Wt 236 lb 5.3 oz (107.2 kg)   SpO2 94%   BMI 34.90 kg/m   HEMODYNAMICS: CVP:  [10 mmHg-17 mmHg] 13 mmHg  VENTILATOR SETTINGS: Vent Mode: PRVC FiO2 (%):  [40 %-70 %] 60 % Set Rate:  [20 bmp-24 bmp] 20 bmp Vt Set:  [530 mL-630 mL] 530 mL PEEP:  [5 cmH20-14 cmH20] 14 cmH20 Plateau Pressure:  [16 cmH20-20 cmH20] 20 cmH20  INTAKE / OUTPUT: I/O last 3 completed shifts: In: 4839.2 [I.V.:3079.2; NG/GT:1710; IV Piggyback:50] Out: [id number] [Urine:10250]  PHYSICAL EXAMINATION: General: ill appearing Neuro: not following commands HEENT: ETT in place Cardiac: regular, no murmur Chest: b/l rhonchi Abd: soft, non tender Ext: 1+ edema Skin: no rashes  LABS:  BMET  Recent Labs Lab 11/20/15 0830 11/21/15 0555 11/22/15 0428  NA 133* 136 136  K 3.3* 2.9* 2.9*  CL 92* 92* 89*  CO2 32 33* 34*  BUN 22* 25* 25*  CREATININE 0.80 0.83 0.88  GLUCOSE 137* 118* 115*   Electrolytes  Recent Labs Lab 11/20/15 0411 11/20/15 0830 11/21/15 0555 11/22/15 0428  CALCIUM 8.2* 8.0* 8.1* 8.9  MG 1.9  --  1.9 2.2  PHOS 1.9*  --  3.5 3.9   CBC  Recent Labs Lab 11/20/15 0411 11/21/15 0405 11/22/15 0428  WBC 11.4* 10.2 8.3  HGB 12.6* 11.2* 11.8*  HCT 38.4* 34.0* 36.1*  PLT 98* 99* 142*   Coag's  Recent Labs Lab 11/17/15 0216 11/17/15 1032  APTT 34 41*  INR 1.16 1.19   Sepsis Markers  Recent Labs Lab 11/16/15 2143 11/17/15 0218  LATICACIDVEN 11.85* 3.2*   ABG  Recent Labs Lab 11/19/15 0345 11/20/15 0406 11/21/15 0322  PHART 7.371 7.450 7.590*   PCO2ART 53.5* 51.4* 42.4  PO2ART 100 57.0* 159.0*    Liver Enzymes  Recent Labs Lab 11/17/15 1032 11/19/15 0344  AST 108* 45*  ALT 79* 43  ALKPHOS 44 40  BILITOT 0.5 0.4  ALBUMIN 3.3* 2.5*    Cardiac Enzymes  Recent Labs Lab 11/17/15 0510 11/17/15 1032 11/17/15 1730  TROPONINI 1.83* 1.67* 0.89*   Glucose  Recent Labs Lab 11/21/15 1217 11/21/15 1609 11/21/15 2005 11/22/15 0003 11/22/15 0342 11/22/15 0746  GLUCAP 128* 139* 110* 129* 131* 124*   Imaging No results found.   STUDIES:  8/27  LHC >> clean coronary arteries, normal LVEDP, normal LV function 8/28  CT head >> no acute intracranial abnormalites; fluid in the paranasal sinuses  8/28  TTE >> suboptimal study, LVEF <20%, severe global hypokinesis, mod LVH, bradycardia with PVCs, normal LA size, mild TR, PAP 31mmHg, trivial posterior pericardial effusion  CULTURES: 8/27 Sputum >> multiple organisms present, none predominant   ANTIBIOTICS: Unasyn 8/27 >> 8/28 Rocephin 8/28 >>   SIGNIFICANT EVENTS: 8/27 Admit with VF arrest 8/28  Swan placed to 57 cm, PA pressure 28/13.  Rewarmed to normothermia  9/01  Remains sedate, periods of desaturation / thick secretions  LINES/TUBES: ETT 8/27 >> L IJ TLC 8/27 >>  R IJ  Cordis 8/28 >> Ernestine Conrad 8/28 >> 8/31  DISCUSSION: 60 year old male with a past medical history significant for obesity and hypertension was admitted 8/27 with a VF cardiac arrest. No evidence of coronary artery disease based on left heart catheterization. Later found to have LVEF of <20% on TTE (suboptimal study).  Differential diagnosis of his cardiac arrest includes a primary cardiac arrhythmia (most likely) versus less likely an acute CNS event or a pulmonary embolism.  ASSESSMENT / PLAN:  PULMONARY A: Acute Hypoxic Respiratory Failure  Aspiration PNA Acute pulmonary edema ?PE as inciting event for cardiac arrest P:   Full vent support Change lasix to 80 mg bid F/u CXR CT angiogram  chest   CARDIOVASCULAR A:  V. fib Arrest - out of hospital cardiac arrest  Cardiogenic Shock >> off pressors Acute Systolic CHF  AF with RVR  P:  Continue amiodarone, heparin gtt  RENAL A:   Hypokalemia Hypomagnesemia  P:   Replace electrolytes as needed  GASTROINTESTINAL A:   Nutrition P:   Tube feeds Protonix  HEMATOLOGIC A:   Thrombocytopenia  P:  F/u CBC  INFECTIOUS A:   Aspiration PNA P:   D 6/7 rocephin   ENDOCRINE A:   Hyperglycemia  P:   SSI  NEUROLOGIC A:   Concern for Anoxic Injury - in setting of cardiac arrest  Hx Back Surgery with Implanted Stimulator P:   RASS goal 0 Change to prn versed, fentanyl   CC time 31 minutes  Coralyn Helling, MD [facility] Pulmonary/Critical Care 11/22/2015, 8:19 AM Pager:  [phone] After 3pm call: [phone]

## 2015-11-22 NOTE — Progress Notes (Signed)
ANTICOAGULATION CONSULT NOTE - Follow Up Consult  Pharmacy Consult for Heparin  Indication: atrial fibrillation   Assessment: 60 yo male s/p cardiac arrest and hypothermia protocol, now with new AFib, for heparin.  Heparin level within goal range  Goal of Therapy:  Heparin level 0.3-0.7 units/ml Monitor platelets by anticoagulation protocol: Yes   Plan:  Continue Heparin at current rate  Follow-up am labs.    No Known Allergies  Patient Measurements: Height: 5\' 9"  (175.3 cm) Weight: 236 lb 5.3 oz (107.2 kg) IBW/kg (Calculated) : 70.7  Vital Signs: Temp: 101.3 F (38.5 C) (09/02 2300) Temp Source: Core (Comment) (09/02 2300) BP: 114/73 (09/02 2300) Pulse Rate: 83 (09/02 2300)  Labs:  Recent Labs  11/20/15 0411 11/20/15 0830  11/21/15 0405 11/21/15 0555  11/22/15 0428 11/22/15 0429 11/22/15 1200 11/22/15 2139  HGB 12.6*  --   --  11.2*  --   --  11.8*  --   --   --   HCT 38.4*  --   --  34.0*  --   --  36.1*  --   --   --   PLT 98*  --   --  99*  --   --  142*  --   --   --   HEPARINUNFRC  --   --   < >  --   --   < >  --  0.24* 0.11* 0.47  CREATININE 0.79 0.80  --   --  0.83  --  0.88  --   --   --   < > = values in this interval not displayed.  Estimated Creatinine Clearance: 107.7 mL/min (by C-G formula based on SCr of 0.88 mg/dL).    Sheppard Coil PharmD., BCPS Clinical Pharmacist Pager [phone] 11/22/2015 11:32 PM

## 2015-11-22 NOTE — Progress Notes (Signed)
ANTICOAGULATION CONSULT NOTE - Follow Up Consult  Pharmacy Consult for Heparin  Indication: atrial fibrillation   Assessment: 60 yo male s/p cardiac arrest and hypothermia protocol, now with new AFib, for heparin  Heparin level still low this evening on 2750 units/hour. Nurse mentioned an occluded line that was changed earlier (after level drawn). Will not make rate adjustment at this time and recheck level in a few hours.  Goal of Therapy:  Heparin level 0.3-0.7 units/ml Monitor platelets by anticoagulation protocol: Yes   Plan:  Recheck heparin level tonight   No Known Allergies  Patient Measurements: Height: 5\' 9"  (175.3 cm) Weight: 236 lb 5.3 oz (107.2 kg) IBW/kg (Calculated) : 70.7  Vital Signs: Temp: 100.2 F (37.9 C) (09/02 1400) Temp Source: Core (Comment) (09/02 1129) BP: 165/75 (09/02 1232) Pulse Rate: 97 (09/02 1400)  Labs:  Recent Labs  11/20/15 0411 11/20/15 0830  11/21/15 0405 11/21/15 0555  11/21/15 1748 11/22/15 0428 11/22/15 0429 11/22/15 1200  HGB 12.6*  --   --  11.2*  --   --   --  11.8*  --   --   HCT 38.4*  --   --  34.0*  --   --   --  36.1*  --   --   PLT 98*  --   --  99*  --   --   --  142*  --   --   HEPARINUNFRC  --   --   < >  --   --   < > 0.23*  --  0.24* 0.11*  CREATININE 0.79 0.80  --   --  0.83  --   --  0.88  --   --   < > = values in this interval not displayed.  Estimated Creatinine Clearance: 107.7 mL/min (by C-G formula based on SCr of 0.88 mg/dL).    Sheppard Coil PharmD., BCPS Clinical Pharmacist Pager [phone] 11/22/2015 5:44 PM

## 2015-11-22 NOTE — Progress Notes (Signed)
Neurology Progress Note  Subjective: He is now off of sedation as of yesterday afternoon. He vomited last night and there is some concern that he has aspirated. He has not shown any evidence of consciousness thus far. He is off of pressors this morning. CTH was ordered but has not yet been done; this is planned for today. The patient is unable to provide any history due to his clinical condition.   Pertinent meds: Drips:  Amiodarone drip 60 mg/hr Heparin drip 2750 units/hr Fentanyl drip off at 0823 11/22/15 Versed drip off at 1300 11/21/15 Milrinone drip off at 0823 11/22/15 Norepinephrine drip off at 0823 11/22/15  PRNs:  Versed 1-2 mg q2 prn, last received 2 mg at 0516 11/22/15 Fentanyl 50 mcg q1 prn, last received 50 mcg at 0018 11/22/15    Current Meds:   Current Facility-Administered Medications:  .  0.9 %  sodium chloride infusion, , Intravenous, Continuous, Alyson ReedyWesam G Yacoub, MD, Last Rate: 10 mL/hr at 11/19/15 1617 .  acetaminophen (TYLENOL) solution 650 mg, 650 mg, Per Tube, Q6H PRN, Zigmund GottronElizabeth C Deterding, MD, 650 mg at 11/21/15 1823 .  amiodarone (NEXTERONE PREMIX) 360-4.14 MG/200ML-% (1.8 mg/mL) IV infusion, 60 mg/hr, Intravenous, Continuous, Tonny BollmanMichael Cooper, MD, Last Rate: 33.3 mL/hr at 11/22/15 0600, 60 mg/hr at 11/22/15 0600 .  bisacodyl (DULCOLAX) suppository 10 mg, 10 mg, Rectal, Daily PRN, Jeanella CrazeBrandi L Ollis, NP .  cefTRIAXone (ROCEPHIN) 2 g in dextrose 5 % 50 mL IVPB, 2 g, Intravenous, Q24H, Lupita Leashouglas B McQuaid, MD, 2 g at 11/21/15 1715 .  chlorhexidine (PERIDEX) 0.12 % solution 15 mL, 15 mL, Mouth Rinse, BID, Lupita Leashouglas B McQuaid, MD, 15 mL at 11/22/15 0809 .  docusate (COLACE) 50 MG/5ML liquid 100 mg, 100 mg, Per Tube, Daily PRN, Coralyn HellingVineet Sood, MD .  feeding supplement (PRO-STAT SUGAR FREE 64) liquid 60 mL, 60 mL, Per Tube, BID, Alyson ReedyWesam G Yacoub, MD, 60 mL at 11/21/15 2206 .  feeding supplement (VITAL HIGH PROTEIN) liquid 1,000 mL, 1,000 mL, Per Tube, Q24H, Alyson ReedyWesam G Yacoub, MD, 1,000 mL at  11/21/15 2000 .  fentaNYL (SUBLIMAZE) injection 100 mcg, 100 mcg, Intravenous, Q2H PRN, Coralyn HellingVineet Sood, MD .  furosemide (LASIX) injection 80 mg, 80 mg, Intravenous, Q12H, Coralyn HellingVineet Sood, MD .  heparin ADULT infusion 100 units/mL (25000 units/22850mL sodium chloride 0.45%), 2,750 Units/hr, Intravenous, Continuous, Lupita Leashouglas B McQuaid, MD, Last Rate: 27.5 mL/hr at 11/22/15 0532, 2,750 Units/hr at 11/22/15 0532 .  insulin aspart (novoLOG) injection 0-9 Units, 0-9 Units, Subcutaneous, Q4H, Jeanella CrazeBrandi L Ollis, NP, 1 Units at 11/22/15 0400 .  MEDLINE mouth rinse, 15 mL, Mouth Rinse, 10 times per day, Lupita Leashouglas B McQuaid, MD, 15 mL at 11/22/15 0600 .  midazolam (VERSED) injection 2 mg, 2 mg, Intravenous, Q2H PRN, Coralyn HellingVineet Sood, MD .  multivitamin liquid 15 mL, 15 mL, Per Tube, Daily, Alyson ReedyWesam G Yacoub, MD, 15 mL at 11/21/15 1017 .  pantoprazole sodium (PROTONIX) 40 mg/20 mL oral suspension 40 mg, 40 mg, Per Tube, Q24H, Coralyn HellingVineet Sood, MD .  potassium chloride 20 MEQ/15ML (10%) solution 40 mEq, 40 mEq, Oral, BID, Sherron Mondayubrey N Jones, RPH, 40 mEq at 11/21/15 1715 .  potassium chloride 20 MEQ/15ML (10%) solution 40 mEq, 40 mEq, Per Tube, Q4H, Vineet Sood, MD .  sodium chloride flush (NS) 0.9 % injection 10-40 mL, 10-40 mL, Intracatheter, Q12H, Lupita Leashouglas B McQuaid, MD, 10 mL at 11/21/15 1000 .  sodium chloride flush (NS) 0.9 % injection 10-40 mL, 10-40 mL, Intracatheter, PRN, Lupita Leashouglas B McQuaid, MD  Objective:  Temp:  [97.8 F (36.6 C)-101.3 F (38.5 C)] 100.1 F (37.8 C) (09/02 0744) Pulse Rate:  [81-102] 86 (09/02 0744) Resp:  [17-44] 22 (09/02 0744) BP: (120-175)/(64-97) 158/89 (09/02 0744) SpO2:  [89 %-99 %] 94 % (09/02 0700) Arterial Line BP: (78-242)/(53-100) 125/57 (09/02 0700) FiO2 (%):  [40 %-70 %] 60 % (09/02 0600) Weight:  [107.2 kg (236 lb 5.3 oz)] 107.2 kg (236 lb 5.3 oz) (09/02 0500)  General: WDWN Caucasian man intubated in ICU. He opens his eyes to voice and will turn his head towards my voice on either side of the  bed. He does not fix or track, however. He is not following any commands.   HEENT: Neck is supple without lymphadenopathy. ETT, OGT in place. Sclerae are anicteric with some edema. There is marked conjunctival injection.  CV: Regular, no murmur.  Lungs: Rhonchi noted bilaterally on anterior exam  Neuro: MS: As noted above. No aphasia.  CN: Pupils are equal and reactive from 3-->2 mm bilaterally. His eyes are conjugate. He does not consistently blink to visual threat. Oculocephalics are intact. Corneals are present bilaterally. His face is symmetric at rest but partly obscured by tubes and tape. The remainder of his cranial nerves cannot be accurately assessed as he is not able to participate with the exam.  Motor: Normal bulk. Tone is diminished throughout. He has no spontaneous movement of the limbs. No tremor or other abnormal movements are observed.  Sensation: He has flexion withdrawal to nailbed pressure in BLE, better on the left. No response to nailbed pressure is seen in the arms.  DTRs: 2+ on the left, 3+ on the right. Toes are mute bilaterally.  Coordination and gait: These cannot be assessed as he is not able to participate with the exam.   Labs: Lab Results  Component Value Date   WBC 8.3 11/22/2015   HGB 11.8 (L) 11/22/2015   HCT 36.1 (L) 11/22/2015   PLT 142 (L) 11/22/2015   GLUCOSE 115 (H) 11/22/2015   ALT 43 11/19/2015   AST 45 (H) 11/19/2015   NA 136 11/22/2015   K 2.9 (L) 11/22/2015   CL 89 (L) 11/22/2015   CREATININE 0.88 11/22/2015   BUN 25 (H) 11/22/2015   CO2 34 (H) 11/22/2015   TSH 1.597 11/18/2015   INR 1.19 11/17/2015   CBC Latest Ref Rng & Units 11/22/2015 11/21/2015 11/20/2015  WBC 4.0 - 10.5 K/uL 8.3 10.2 11.4(H)  Hemoglobin 13.0 - 17.0 g/dL 11.8(L) 11.2(L) 12.6(L)  Hematocrit 39.0 - 52.0 % 36.1(L) 34.0(L) 38.4(L)  Platelets 150 - 400 K/uL 142(L) 99(L) 98(L)    No results found for: HGBA1C Lab Results  Component Value Date   ALT 43 11/19/2015   AST 45  (H) 11/19/2015   ALKPHOS 40 11/19/2015   BILITOT 0.4 11/19/2015    Radiology: There is no new neuroimaging for review.   A/P:   1. Anoxic brain injury: This is acute, due to out of hospital cardiac arrest on 8/27 with first identified rhythm ventricular fibrillation. Total downtime is unknown with documented resuscitation time after EMS arrived of 16 minutes. He has now day #4after completing the therapeutic hypothermia protocol, as he reached normothermia at approximately noon on 11/18/15. Treatment is supportive, as noted below.  2. Anoxic encephalopathy: This is acute, due to anoxic brain injury from cardiac arrest. Brainstem remains intact with some localization to pain and now turning his head towards voice on both sides. On previous exam he was noted to lift his  arms nonpurposefully.Though he is still not showing clear evidence of higher cortical function, these would suggest a favorable prognosis for meaningful neurologic recovery at this point. CTH is pending. Continue with supportive care. Avoid hypoxemia and hypotension for even brief intervals as these are associated with worse neurologic outcomes. Fever and hyperglycemia must be aggressively treated for the same reason. Continue to follow the exam.   3. Possible right hemiparesis: He has brisker DTRs on the right and appears to be less responsive to pain on the right as well. He has, however, been observed to lift the right arm off the bed spontaneously. CTH is pending, per RN planned for today.   This patient is critically ill and at significant risk of neurological worsening, death and care requires constant monitoring of vital signs, hemodynamics,respiratory and cardiac monitoring, neurological assessment, discussion with family, other specialists and medical decision making of high complexity. A total of 30 minutes of critical care time was spent on this case.    Rhona Leavens, MD [facility]

## 2015-11-23 ENCOUNTER — Inpatient Hospital Stay (HOSPITAL_COMMUNITY): Payer: Managed Care, Other (non HMO)

## 2015-11-23 LAB — BLOOD GAS, ARTERIAL
ACID-BASE EXCESS: 9.1 mmol/L — AB (ref 0.0–2.0)
BICARBONATE: 32.9 mmol/L — AB (ref 20.0–28.0)
Drawn by: 28340
FIO2: 60
LHR: 16 {breaths}/min
O2 Saturation: 96.1 %
PATIENT TEMPERATURE: 98.6
PCO2 ART: 43.6 mmHg (ref 32.0–48.0)
PEEP/CPAP: 10 cmH2O
PO2 ART: 82.7 mmHg — AB (ref 83.0–108.0)
PRESSURE CONTROL: 20 cmH2O
pH, Arterial: 7.491 — ABNORMAL HIGH (ref 7.350–7.450)

## 2015-11-23 LAB — BASIC METABOLIC PANEL
ANION GAP: 9 (ref 5–15)
BUN: 38 mg/dL — ABNORMAL HIGH (ref 6–20)
CHLORIDE: 99 mmol/L — AB (ref 101–111)
CO2: 33 mmol/L — AB (ref 22–32)
Calcium: 9 mg/dL (ref 8.9–10.3)
Creatinine, Ser: 0.96 mg/dL (ref 0.61–1.24)
GFR calc non Af Amer: >60 mL/min (ref >60)
Glucose, Bld: 138 mg/dL — ABNORMAL HIGH (ref 65–99)
POTASSIUM: 2.9 mmol/L — AB (ref 3.5–5.1)
SODIUM: 141 mmol/L (ref 135–145)

## 2015-11-23 LAB — GLUCOSE, CAPILLARY
GLUCOSE-CAPILLARY: 119 mg/dL — AB (ref 65–99)
GLUCOSE-CAPILLARY: 131 mg/dL — AB (ref 65–99)
Glucose-Capillary: 126 mg/dL — ABNORMAL HIGH (ref 65–99)
Glucose-Capillary: 129 mg/dL — ABNORMAL HIGH (ref 65–99)
Glucose-Capillary: 138 mg/dL — ABNORMAL HIGH (ref 65–99)
Glucose-Capillary: 141 mg/dL — ABNORMAL HIGH (ref 65–99)

## 2015-11-23 LAB — CBC
HEMATOCRIT: 37.3 % — AB (ref 39.0–52.0)
HEMOGLOBIN: 12.3 g/dL — AB (ref 13.0–17.0)
MCH: 32.6 pg (ref 26.0–34.0)
MCHC: 33 g/dL (ref 30.0–36.0)
MCV: 98.9 fL (ref 78.0–100.0)
Platelets: 87 10*3/uL — ABNORMAL LOW (ref 150–400)
RBC: 3.77 MIL/uL — AB (ref 4.22–5.81)
RDW: 14 % (ref 11.5–15.5)
WBC: 11.8 10*3/uL — AB (ref 4.0–10.5)

## 2015-11-23 LAB — POTASSIUM: Potassium: 3.5 mmol/L (ref 3.5–5.1)

## 2015-11-23 LAB — HEPARIN LEVEL (UNFRACTIONATED): Heparin Unfractionated: 0.5 IU/mL (ref 0.30–0.70)

## 2015-11-23 MED ORDER — SPIRONOLACTONE 25 MG PO TABS
12.5000 mg | ORAL_TABLET | Freq: Every day | ORAL | Status: DC
Start: 2015-11-23 — End: 2015-12-03
  Administered 2015-11-23 – 2015-12-03 (×10): 12.5 mg via ORAL
  Filled 2015-11-23 (×9): qty 1

## 2015-11-23 MED ORDER — FUROSEMIDE 10 MG/ML IJ SOLN
40.0000 mg | Freq: Every day | INTRAMUSCULAR | Status: DC
  Administered 2015-11-23: 40 mg via INTRAVENOUS
  Filled 2015-11-23: qty 4

## 2015-11-23 MED ORDER — POTASSIUM CHLORIDE 20 MEQ/15ML (10%) PO SOLN
20.0000 meq | Freq: Once | ORAL | Status: AC
Start: 2015-11-23 — End: 2015-11-23
  Administered 2015-11-23: 20 meq
  Filled 2015-11-23: qty 15

## 2015-11-23 MED ORDER — FUROSEMIDE 10 MG/ML IJ SOLN
40.0000 mg | Freq: Two times a day (BID) | INTRAMUSCULAR | Status: DC
  Administered 2015-11-23 – 2015-11-24 (×3): 40 mg via INTRAVENOUS
  Filled 2015-11-23 (×3): qty 4

## 2015-11-23 MED ORDER — ENALAPRILAT 1.25 MG/ML IV SOLN
0.6250 mg | Freq: Three times a day (TID) | INTRAVENOUS | Status: DC
  Administered 2015-11-23 – 2015-11-25 (×6): 0.625 mg via INTRAVENOUS
  Filled 2015-11-23 (×8): qty 0.5

## 2015-11-23 MED ORDER — POTASSIUM CHLORIDE 10 MEQ/50ML IV SOLN
10.0000 meq | INTRAVENOUS | Status: AC
  Administered 2015-11-23 (×4): 10 meq via INTRAVENOUS
  Filled 2015-11-23 (×4): qty 50

## 2015-11-23 NOTE — Progress Notes (Signed)
PULMONARY / CRITICAL CARE MEDICINE   Name: Arthur Chase MRN: [id number] DOB: 04-14-1955    ADMISSION DATE:  11/16/2015 CONSULTATION DATE:  11/16/2015  REFERRING MD:  Dr. Peter [place]  CHIEF COMPLAINT:  Cardiac arrest  SUBJECTIVE:    Following commands this morning!  Still on increased PEEP, FiO2.  RN reports melanotic type stools.  VITAL SIGNS: BP 117/87   Pulse 77   Temp 99.5 F (37.5 C) (Core (Comment))   Resp 16   Ht 5\' 9"  (1.753 m)   Wt 238 lb 1.6 oz (108 kg)   SpO2 97%   BMI 35.16 kg/m   HEMODYNAMICS: CVP:  [9 mmHg-11 mmHg] 10 mmHg  VENTILATOR SETTINGS: Vent Mode: PCV FiO2 (%):  [50 %-60 %] 60 % Set Rate:  [16 bmp-20 bmp] 16 bmp Vt Set:  [530 mL] 530 mL PEEP:  [10 cmH20-14 cmH20] 10 cmH20 Plateau Pressure:  [20 cmH20-22 cmH20] 22 cmH20  INTAKE / OUTPUT: I/O last 3 completed shifts: In: 5016.4 [I.V.:2816.4; NG/GT:2000; IV Piggyback:200] Out: 7265 [Urine:5565; Emesis/NG output:1700]  PHYSICAL EXAMINATION: General: ill appearing Neuro: not following commands HEENT: edema and scleral injection Lt eye, no discharge Cardiac: regular, no murmur Chest: b/l rhonchi, decreased BS at bases Abd: soft, non tender Ext: 1+ edema Skin: no rashes  LABS:  BMET  Recent Labs Lab 11/21/15 0555 11/22/15 0428 11/23/15 0500  NA 136 136 141  K 2.9* 2.9* 2.9*  CL 92* 89* 99*  CO2 33* 34* 33*  BUN 25* 25* 38*  CREATININE 0.83 0.88 0.96  GLUCOSE 118* 115* 138*   Electrolytes  Recent Labs Lab 11/20/15 0411  11/21/15 0555 11/22/15 0428 11/23/15 0500  CALCIUM 8.2*  < > 8.1* 8.9 9.0  MG 1.9  --  1.9 2.2  --   PHOS 1.9*  --  3.5 3.9  --   < > = values in this interval not displayed. CBC  Recent Labs Lab 11/21/15 0405 11/22/15 0428 11/23/15 0500  WBC 10.2 8.3 11.8*  HGB 11.2* 11.8* 12.3*  HCT 34.0* 36.1* 37.3*  PLT 99* 142* 87*   Coag's  Recent Labs Lab 11/17/15 0216 11/17/15 1032  APTT 34 41*  INR 1.16 1.19   Sepsis Markers  Recent  Labs Lab 11/16/15 2143 11/17/15 0218  LATICACIDVEN 11.85* 3.2*   ABG  Recent Labs Lab 11/21/15 0322 11/22/15 1050 11/23/15 0400  PHART 7.590* 7.503* 7.491*  PCO2ART 42.4 44.1 43.6  PO2ART 159.0* 80.7* 82.7*    Liver Enzymes  Recent Labs Lab 11/17/15 1032 11/19/15 0344  AST 108* 45*  ALT 79* 43  ALKPHOS 44 40  BILITOT 0.5 0.4  ALBUMIN 3.3* 2.5*    Cardiac Enzymes  Recent Labs Lab 11/17/15 0510 11/17/15 1032 11/17/15 1730  TROPONINI 1.83* 1.67* 0.89*   Glucose  Recent Labs Lab 11/22/15 0342 11/22/15 0746 11/22/15 1128 11/22/15 1948 11/23/15 0009 11/23/15 0440  GLUCAP 131* 124* 155* 126* 138* 119*   Imaging Ct Head Wo Contrast  Result Date: 11/22/2015 CLINICAL DATA:  Concern regarding anoxic encephalopathy. Respiratory failure and heart failure. EXAM: CT HEAD WITHOUT CONTRAST TECHNIQUE: Contiguous axial images were obtained from the base of the skull through the vertex without intravenous contrast. COMPARISON:  11/17/2015 FINDINGS: Brain: The brain demonstrates stable CT appearance with no evidence of hemorrhage, infarction, edema, mass effect, extra-axial fluid collection, hydrocephalus or mass lesion. Vascular: No hyperdense vessel or unexpected calcification. Skull: The skull is unremarkable. Sinuses/Orbits: Mucosal thickening within both maxillary antra, left greater than right. Additional mucosal  thickening in bilateral ethmoid and sphenoid air cells. IMPRESSION: Stable head CT demonstrating no acute findings or evidence of visible infarcts. Mucosal sinus thickening. Electronically Signed   By: Irish LackGlenn  Yamagata M.D.   On: 11/22/2015 16:03   Ct Angio Chest Pe W Or Wo Contrast  Result Date: 11/22/2015 CLINICAL DATA:  Cardiac arrest and respiratory failure. Aspiration pneumonia. EXAM: CT ANGIOGRAPHY CHEST WITH CONTRAST TECHNIQUE: Multidetector CT imaging of the chest was performed using the standard protocol during bolus administration of intravenous contrast.  Multiplanar CT image reconstructions and MIPs were obtained to evaluate the vascular anatomy. CONTRAST:  80 mL Isovue 370 IV COMPARISON:  Chest x-ray earlier today. FINDINGS: Cardiovascular: Pulmonary arteries are well opacified. There is no evidence of pulmonary embolism. The thoracic aorta is of normal caliber. There is separate origin of the left vertebral artery off of the aortic arch. The heart is mildly enlarged. No pericardial effusion identified. There is some calcified plaque in the LAD. Mediastinum/Nodes: No evidence of mediastinal masses or lymphadenopathy. Some small mediastinal lymph nodes present. No evidence of hilar lymphadenopathy. Lungs/Pleura: Ground-glass opacity and alveolar airspace disease in the upper lobes bilaterally and extending into the lingula. This likely represents alveolar edema. There is dense consolidation and atelectasis of both lower lobes in a fairly symmetric pattern. This most likely represents atelectasis but there may also be a component of aspiration pneumonia. No associated pleural effusion. No pneumothorax. Endotracheal tube present with the tip above the carina. Upper Abdomen: Nasogastric tube enters the stomach. Musculoskeletal: No significant bony abnormalities. Review of the MIP images confirms the above findings. IMPRESSION: 1. No evidence of pulmonary embolism. 2. Calcified plaque seen in the distribution of the LAD. 3. Pulmonary edema with alveolar opacities in both upper lobes. Dense airspace consolidation and volume loss in the lower lobes bilaterally. This most likely represents atelectasis. Component of aspiration pneumonia cannot be excluded. Electronically Signed   By: Irish LackGlenn  Yamagata M.D.   On: 11/22/2015 16:08     STUDIES:  8/27  LHC >> clean coronary arteries, normal LVEDP, normal LV function 8/28  CT head >> no acute intracranial abnormalites; fluid in the paranasal sinuses  8/28  TTE >> suboptimal study, LVEF <20%, severe global hypokinesis, mod  LVH, bradycardia with PVCs, normal LA size, mild TR, PAP 31mmHg, trivial posterior pericardial effusion 9/02 CT head >> no acute findings 9/02 CT angio chest >> pulmonary edema, bibasilar ATX/consolidation, no PE  CULTURES: 8/27 Sputum >> multiple organisms present, none predominant   ANTIBIOTICS: Unasyn 8/27 >> 8/28 Rocephin 8/28 >>   SIGNIFICANT EVENTS: 8/27 Admit with VF arrest 8/28  Swan placed to 57 cm, PA pressure 28/13.  Rewarmed to normothermia  9/01  Remains sedate, periods of desaturation / thick secretions 9/03 d/c heparin gtt due to low PLT  LINES/TUBES: ETT 8/27 >> L IJ TLC 8/27 >>  R IJ Cordis 8/28 >> Swan 8/28 >> 8/31  DISCUSSION: 60 year old male with a past medical history significant for obesity and hypertension was admitted 8/27 with a VF cardiac arrest. No evidence of coronary artery disease based on left heart catheterization. Later found to have LVEF of <20% on TTE (suboptimal study).  Differential diagnosis of his cardiac arrest includes a primary cardiac arrhythmia (most likely) versus less likely an acute CNS event or a pulmonary embolism.  ASSESSMENT / PLAN:  PULMONARY A: Acute Hypoxic Respiratory Failure 2nd to acute pulmonary edema and aspiration pneumonia. P:   Full vent support >> changed to pressure control 9/02 >> better  vent synchrony Continue lasix to 40 mg daily F/u CXR   CARDIOVASCULAR A:  Cardiac arrest with VF. Cardiogenic shock >> resolved. Acute systolic CHF. A fib with RVR. P:  Continue amiodarone per cardioloyg Hold heparin gtt in setting of low PLT  RENAL A:   Hypokalemia. Hypomagnesemia. P:   Replace electrolytes as needed  GASTROINTESTINAL A:   Nutrition. Melanotic stool >> noted 9/03. Gagging. 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 P:   D 7 rocephin   ENDOCRINE A:   Hyperglycemia.  P:   SSI  NEUROLOGIC A:   Acute metabolic encephalopathy >> following commands 9/03. Hx Back Surgery with Implanted Stimulator. P:   RASS goal 0 Change to prn versed, fentanyl  OPTHALMOLOGY A: OS scleritis. P: Tobramycin gtt   CC time 31 minutes  Coralyn Helling, MD [facility] Pulmonary/Critical Care 11/23/2015, 7:35 AM Pager:  [phone] After 3pm call: [phone]

## 2015-11-23 NOTE — Progress Notes (Signed)
Neurology Progress Note  Subjective: Big changes this morning as he is not following commands on a consistent basis. He will nod and shake his head appropriately in response to questions, at least intermittently. He has developed some melanotic stools.   Pertinent meds: Drips:  Amiodarone drip 60 mg/hr Fentanyl drip 250 mcg/hr Versed drip off at 1300 11/21/15 Milrinone drip off at 0823 11/22/15 Norepinephrine drip off at 0823 11/22/15  PRNs:  Versed 1-2 mg q2 prn, last received 2 mg at 0403 11/23/15 (total 10 mg over past 24 hrs) Fentanyl 50-100 mcg prn, last received 50 mcg at 0400 11/23/15 (total boluses 500 mcg past 24 hrs)    Current Meds:   Current Facility-Administered Medications:  .  0.9 %  sodium chloride infusion, , Intravenous, Continuous, Alyson Reedy, MD, Last Rate: 10 mL/hr at 11/22/15 2234 .  acetaminophen (TYLENOL) solution 650 mg, 650 mg, Per Tube, Q6H PRN, Zigmund Gottron, MD, 650 mg at 11/22/15 2235 .  amiodarone (NEXTERONE PREMIX) 360-4.14 MG/200ML-% (1.8 mg/mL) IV infusion, 60 mg/hr, Intravenous, Continuous, Tonny Bollman, MD, Last Rate: 33.3 mL/hr at 11/23/15 0252, 60 mg/hr at 11/23/15 0252 .  bisacodyl (DULCOLAX) suppository 10 mg, 10 mg, Rectal, Daily PRN, Jeanella Craze, NP .  cefTRIAXone (ROCEPHIN) 2 g in dextrose 5 % 50 mL IVPB, 2 g, Intravenous, Q24H, Lupita Leash, MD, 2 g at 11/22/15 1711 .  chlorhexidine (PERIDEX) 0.12 % solution 15 mL, 15 mL, Mouth Rinse, BID, Lupita Leash, MD, 15 mL at 11/23/15 0800 .  docusate (COLACE) 50 MG/5ML liquid 100 mg, 100 mg, Per Tube, Daily PRN, Coralyn Helling, MD .  feeding supplement (PRO-STAT SUGAR FREE 64) liquid 60 mL, 60 mL, Per Tube, BID, Alyson Reedy, MD, 60 mL at 11/23/15 0959 .  feeding supplement (VITAL HIGH PROTEIN) liquid 1,000 mL, 1,000 mL, Per Tube, Q24H, Alyson Reedy, MD, 1,000 mL at 11/22/15 1247 .  fentaNYL (SUBLIMAZE) 2,500 mcg in sodium chloride 0.9 % 250 mL (10 mcg/mL) infusion, 25-300 mcg/hr,  Intravenous, Continuous, Roslynn Amble, MD, Last Rate: 25 mL/hr at 11/23/15 0600, 250 mcg/hr at 11/23/15 0600 .  fentaNYL (SUBLIMAZE) bolus via infusion 25-50 mcg, 25-50 mcg, Intravenous, Q30 min PRN, Roslynn Amble, MD, 50 mcg at 11/23/15 0400 .  furosemide (LASIX) injection 40 mg, 40 mg, Intravenous, Daily, Jose Alexis Frock, MD, 40 mg at 11/23/15 0959 .  insulin aspart (novoLOG) injection 0-9 Units, 0-9 Units, Subcutaneous, Q4H, Jeanella Craze, NP, 1 Units at 11/23/15 0800 .  MEDLINE mouth rinse, 15 mL, Mouth Rinse, 10 times per day, Lupita Leash, MD, 15 mL at 11/23/15 1000 .  midazolam (VERSED) injection 2 mg, 2 mg, Intravenous, Q2H PRN, Coralyn Helling, MD, 2 mg at 11/23/15 0403 .  multivitamin liquid 15 mL, 15 mL, Per Tube, Daily, Alyson Reedy, MD, 15 mL at 11/22/15 0955 .  pantoprazole sodium (PROTONIX) 40 mg/20 mL oral suspension 40 mg, 40 mg, Per Tube, Q24H, Coralyn Helling, MD, 40 mg at 11/23/15 0922 .  potassium chloride 10 mEq in 50 mL *CENTRAL LINE* IVPB, 10 mEq, Intravenous, Q1 Hr x 4, Jose Angelo A [place], MD, 10 mEq at 11/23/15 [phone] .  potassium chloride 20 MEQ/15ML (10%) solution 40 mEq, 40 mEq, Oral, BID, Sherron Monday, RPH, 40 mEq at 11/23/15 9604 .  sodium chloride flush (NS) 0.9 % injection 10-40 mL, 10-40 mL, Intracatheter, Q12H, Lupita Leash, MD, 10 mL at 11/22/15 2235 .  sodium  chloride flush (NS) 0.9 % injection 10-40 mL, 10-40 mL, Intracatheter, PRN, Lupita Leash, MD, 20 mL at 11/22/15 2048 .  tobramycin (TOBREX) 0.3 % ophthalmic solution 1 drop, 1 drop, Left Eye, Q6H, Roslynn Amble, MD, 1 drop at 11/23/15 0542  Objective:  Temp:  [99 F (37.2 C)-101.3 F (38.5 C)] 99.5 F (37.5 C) (09/03 0700) Pulse Rate:  [77-100] 77 (09/03 0700) Resp:  [16-27] 16 (09/03 0700) BP: (96-181)/(59-101) 117/87 (09/03 0700) SpO2:  [91 %-98 %] 97 % (09/03 0700) Arterial Line BP: (159-219)/(73-106) 206/88 (09/02 1600) FiO2 (%):  [50 %-60 %] 60 % (09/03  0600) Weight:  [108 kg (238 lb 1.6 oz)] 108 kg (238 lb 1.6 oz) (09/03 0436)  General: WDWN Caucasian man intubated in ICU. He opens his eyes spontaneously and will fix and attempts to track. He follows midline and appendicular commands consistently.  HEENT: Neck is supple without lymphadenopathy. ETT, OGT in place. Sclerae are anicteric with some edema. There is marked conjunctival injection on the left.  CV: Regular, no murmur.  Lungs: Rhonchi noted bilaterally on anterior exam  Neuro: MS: As noted above. No aphasia.  CN: Pupils are equal and reactive from 3-->2 mm bilaterally. His eyes are conjugate. He blinks to visual threat. He weakly tracks my finger with saccadic pursuits in all directions. Corneals are present bilaterally. His face is symmetric at rest but partly obscured by tubes and tape. The remainder of his cranial nerves cannot be accurately assessed as he is not able to participate with the exam.  Motor: Normal bulk. Tone is normal. He will participate with portions of the strength testing with at least 4/5 strength in BUE and at both ankles. No tremor or other abnormal movements are observed.  Sensation: He indicates intact sensation throughout.  DTRs: 2+ on the left, 3+ on the right. Toes are mute bilaterally.  Coordination and gait: These cannot be assessed as he is not able to participate with the exam.   Labs: Lab Results  Component Value Date   WBC 11.8 (H) 11/23/2015   HGB 12.3 (L) 11/23/2015   HCT 37.3 (L) 11/23/2015   PLT 87 (L) 11/23/2015   GLUCOSE 138 (H) 11/23/2015   ALT 43 11/19/2015   AST 45 (H) 11/19/2015   NA 141 11/23/2015   K 2.9 (L) 11/23/2015   CL 99 (L) 11/23/2015   CREATININE 0.96 11/23/2015   BUN 38 (H) 11/23/2015   CO2 33 (H) 11/23/2015   TSH 1.597 11/18/2015   INR 1.19 11/17/2015   CBC Latest Ref Rng & Units 11/23/2015 11/22/2015 11/21/2015  WBC 4.0 - 10.5 K/uL 11.8(H) 8.3 10.2  Hemoglobin 13.0 - 17.0 g/dL 12.3(L) 11.8(L) 11.2(L)  Hematocrit 39.0  - 52.0 % 37.3(L) 36.1(L) 34.0(L)  Platelets 150 - 400 K/uL 87(L) 142(L) 99(L)    No results found for: HGBA1C Lab Results  Component Value Date   ALT 43 11/19/2015   AST 45 (H) 11/19/2015   ALKPHOS 40 11/19/2015   BILITOT 0.4 11/19/2015    Radiology: I have personally and independently reviewed the [facility] without contrast from today. This shows no obvious acute abnormality. He has mild burden of chronic small vessel ischemic change in the bihemispheric white matter.   A/P:   1. Anoxic brain injury: This is acute, due to out of hospital cardiac arrest on 8/27 with first identified rhythm ventricular fibrillation. Total downtime is unknown with documented resuscitation time after EMS arrived of 16 minutes. He has now day #5after completing  the therapeutic hypothermia protocol, as he reached normothermia at approximately noon on 11/18/15. Treatment is supportive, as noted below.  2. Anoxic encephalopathy: This is acute, due to anoxic brain injury from cardiac arrest. He has shown dramatic improvement in his exam today and is now communicative (nods and shakes head appropriately to questions) and following midline and appendicular commands consistently. Prognosis for meaningful neurologic recovery is very good. Continue with supportive care. Avoid hypoxemia and hypotension for even brief intervals as these are associated with worse neurologic outcomes. Fever and hyperglycemia must be aggressively treated for the same reason. Continue to follow the exam.   3. Possible right hemiparesis: He has brisker DTRs on the right but strength appears to be symmetric on limited confrontational testing today. CTH did not show any pathology to explain asymmetric reflexes. Follow.   This patient is critically ill and at significant risk of neurological worsening, death and care requires constant monitoring of vital signs, hemodynamics,respiratory and cardiac monitoring, neurological assessment, discussion with  family, other specialists and medical decision making of high complexity. A total of 30 minutes of critical care time was spent on this case.    Rhona Leavensimothy Oster, MD [facility]

## 2015-11-23 NOTE — Progress Notes (Signed)
eLink Physician-Brief Progress Note Patient Name: Arthur Chase DOB: 1955/05/02 MRN: [id number]   Date of Service  11/23/2015  HPI/Events of Note  Potassium is 2.9. It has been 2.9 the last 3 days. Patient is -8 L at least since admission. Chest CT scan still with pulmonary edema.   eICU Interventions  Will replace potassium IV and by mouth. Check potassium at 10 AM.  Will hold off on lasix for now >  Maybe decrease to 40 mg IV daily if OK with team. Give lasix this am once K is normal.      Intervention Category Intermediate Interventions: Electrolyte abnormality - evaluation and management  Daneen Schick Dios 11/23/2015, 6:37 AM

## 2015-11-23 NOTE — Progress Notes (Addendum)
Subjective:   Remains intubated. Now responding to commands. Back on Fentanyl.   Head CT yesterday. No acute process.  CT chest + pulmonary edema   Diuresing well. CVP 8.    Scheduled Meds: . cefTRIAXone (ROCEPHIN)  IV  2 g Intravenous Q24H  . chlorhexidine  15 mL Mouth Rinse BID  . feeding supplement (PRO-STAT SUGAR FREE 64)  60 mL Per Tube BID  . feeding supplement (VITAL HIGH PROTEIN)  1,000 mL Per Tube Q24H  . furosemide  40 mg Intravenous Daily  . insulin aspart  0-9 Units Subcutaneous Q4H  . mouth rinse  15 mL Mouth Rinse 10 times per day  . multivitamin  15 mL Per Tube Daily  . pantoprazole sodium  40 mg Per Tube Q24H  . potassium chloride  10 mEq Intravenous Q1 Hr x 4  . potassium chloride  40 mEq Oral BID  . sodium chloride flush  10-40 mL Intracatheter Q12H  . tobramycin  1 drop Left Eye Q6H   Continuous Infusions: . sodium chloride 10 mL/hr at 11/22/15 2234  . amiodarone 60 mg/hr (11/23/15 1136)  . fentaNYL infusion INTRAVENOUS 25 mcg/hr (11/23/15 0700)   PRN Meds:.acetaminophen (TYLENOL) oral liquid 160 mg/5 mL, bisacodyl, docusate, fentaNYL, midazolam, sodium chloride flush   Objective:  Vital Signs in the last 24 hours: Temp:  [98.4 F (36.9 C)-101.3 F (38.5 C)] 99.7 F (37.6 C) (09/03 1000) Pulse Rate:  [77-100] 92 (09/03 1120) Resp:  [16-27] 23 (09/03 1120) BP: (96-181)/(59-101) 157/79 (09/03 1120) SpO2:  [91 %-98 %] 93 % (09/03 1120) Arterial Line BP: (174-219)/(78-106) 206/88 (09/02 1600) FiO2 (%):  [50 %-60 %] 60 % (09/03 1120) Weight:  [108 kg (238 lb 1.6 oz)] 108 kg (238 lb 1.6 oz) (09/03 0436)  Intake/Output from previous day: 09/02 0701 - 09/03 0700 In: 3296 [I.V.:1866; NG/GT:1280; IV Piggyback:150] Out: 5315 [Urine:3615; Emesis/NG output:1700]  Physical Exam: Pt is intubated, follows some commands HEENT: normal x for ETT + exudate on eyelids with subconjunctival hemorrhages L>R Neck: JVP 8 Lungs: CTA bilaterally CV: RRR without  murmur or gallop Abd: soft, no mass + distended +hypoactive BS Ext: no C/C/E, distal pulses intact and equal Skin: warm/dry no rash   Lab Results:  Recent Labs  11/22/15 0428 11/23/15 0500  WBC 8.3 11.8*  HGB 11.8* 12.3*  PLT 142* 87*    Recent Labs  11/22/15 0428 11/23/15 0500  NA 136 141  K 2.9* 2.9*  CL 89* 99*  CO2 34* 33*  GLUCOSE 115* 138*  BUN 25* 38*  CREATININE 0.88 0.96   No results for input(s): TROPONINI in the last 72 hours.  Invalid input(s): CK, MB  Cardiac Studies: echo 8/31 LVEF 25-30% severe biventricular dysfunction  Tele: Sinus rhythm/tach ~100  Assessment/Plan:  1. Acute respiratory failure/VDRF 2. Out-of-hospital cardiac arrest 3. Acute systolic heart failure with acute cardiogenic shock   EF 25-30 on echo 8/31 4. Pulmonary edema  5. VF/nonsustained VT - rhythm now stable 6. Anoxic encephalopathy secondary to OOH arrest - neurology team following 7. Atrial fibrillation with RVR - has converted back to sinus on IV amiodarone 8. Hypokalemia 9. Thrombocytopenia  Mental status much improved. Now following commands. Still some question of mild right sided weakness.   Relatively stable from cardiac perspective. CVP improving but still with pulmonary edema and hgh FiO2 requirement. Increase lasix to 40 bid.  Rhythm stable on amio can decrease to 30. Supp K+. Start spiro and low dose IV enalapril.  If AF recurs would use bivaliriudin fo AC as he is off heparin due to ? HIT.    Critical Care Time devoted to patient care services described in this note is 35 Minutes.   Arvilla MeresBensimhon, Daniel, M.D. 11/23/2015, 12:10 PM

## 2015-11-24 ENCOUNTER — Inpatient Hospital Stay (HOSPITAL_COMMUNITY): Payer: Managed Care, Other (non HMO)

## 2015-11-24 LAB — CBC
HCT: 37.2 % — ABNORMAL LOW (ref 39.0–52.0)
Hemoglobin: 11.8 g/dL — ABNORMAL LOW (ref 13.0–17.0)
MCH: 32.6 pg (ref 26.0–34.0)
MCHC: 31.7 g/dL (ref 30.0–36.0)
MCV: 102.8 fL — AB (ref 78.0–100.0)
PLATELETS: 111 10*3/uL — AB (ref 150–400)
RBC: 3.62 MIL/uL — ABNORMAL LOW (ref 4.22–5.81)
RDW: 16.6 % — AB (ref 11.5–15.5)
WBC: 12.3 10*3/uL — AB (ref 4.0–10.5)

## 2015-11-24 LAB — C DIFFICILE QUICK SCREEN W PCR REFLEX
C DIFFICILE (CDIFF) INTERP: NOT DETECTED
C DIFFICLE (CDIFF) ANTIGEN: NEGATIVE
C Diff toxin: NEGATIVE

## 2015-11-24 LAB — GLUCOSE, CAPILLARY
GLUCOSE-CAPILLARY: 130 mg/dL — AB (ref 65–99)
Glucose-Capillary: 113 mg/dL — ABNORMAL HIGH (ref 65–99)
Glucose-Capillary: 138 mg/dL — ABNORMAL HIGH (ref 65–99)

## 2015-11-24 LAB — BASIC METABOLIC PANEL
ANION GAP: 10 (ref 5–15)
BUN: 49 mg/dL — AB (ref 6–20)
CALCIUM: 9.2 mg/dL (ref 8.9–10.3)
CO2: 31 mmol/L (ref 22–32)
Chloride: 105 mmol/L (ref 101–111)
Creatinine, Ser: 0.96 mg/dL (ref 0.61–1.24)
GFR calc Af Amer: >60 mL/min (ref >60)
GLUCOSE: 121 mg/dL — AB (ref 65–99)
Potassium: 3.2 mmol/L — ABNORMAL LOW (ref 3.5–5.1)
Sodium: 146 mmol/L — ABNORMAL HIGH (ref 135–145)

## 2015-11-24 LAB — OCCULT BLOOD X 1 CARD TO LAB, STOOL: FECAL OCCULT BLD: POSITIVE — AB

## 2015-11-24 LAB — MAGNESIUM: Magnesium: 2.7 mg/dL — ABNORMAL HIGH (ref 1.7–2.4)

## 2015-11-24 LAB — HEPARIN INDUCED PLATELET AB (HIT ANTIBODY): HEPARIN INDUCED PLT AB: 2.629 {OD_unit} — AB (ref 0.000–0.400)

## 2015-11-24 MED ORDER — POTASSIUM CHLORIDE 10 MEQ/50ML IV SOLN
10.0000 meq | INTRAVENOUS | Status: AC
  Administered 2015-11-24 (×2): 10 meq via INTRAVENOUS
  Filled 2015-11-24 (×2): qty 50

## 2015-11-24 MED ORDER — POTASSIUM CHLORIDE 20 MEQ/15ML (10%) PO SOLN: 40.0000 meq | Freq: Two times a day (BID) | ORAL | Status: DC

## 2015-11-24 MED ORDER — POTASSIUM CHLORIDE CRYS ER 20 MEQ PO TBCR: 40.0000 meq | EXTENDED_RELEASE_TABLET | Freq: Two times a day (BID) | ORAL | Status: DC

## 2015-11-24 MED ORDER — POTASSIUM CHLORIDE 10 MEQ/100ML IV SOLN: 10.0000 meq | INTRAVENOUS | Status: DC

## 2015-11-24 MED ORDER — FENTANYL CITRATE (PF) 100 MCG/2ML IJ SOLN: 50.0000 ug | INTRAMUSCULAR | Status: DC | PRN

## 2015-11-24 NOTE — Progress Notes (Signed)
PULMONARY / CRITICAL CARE MEDICINE   Name: Arthur Chase MRN: [id number] DOB: November 01, 1955    ADMISSION DATE:  11/16/2015 CONSULTATION DATE:  11/16/2015  REFERRING MD:  Dr. Peter Swaziland  CHIEF COMPLAINT:  Cardiac arrest  SUBJECTIVE:    Did well with pressure support.  VITAL SIGNS: BP 121/70   Pulse 77   Temp 99.1 F (37.3 C)   Resp 12   Ht 5\' 9"  (1.753 m)   Wt 226 lb 6.6 oz (102.7 kg)   SpO2 96%   BMI 33.44 kg/m   HEMODYNAMICS: CVP:  [7 mmHg-11 mmHg] 11 mmHg  VENTILATOR SETTINGS: Vent Mode: PSV;CPAP FiO2 (%):  [40 %-60 %] 40 % Set Rate:  [16 bmp] 16 bmp PEEP:  [5 cmH20-10 cmH20] 5 cmH20 Pressure Support:  [5 cmH20] 5 cmH20 Plateau Pressure:  [13 cmH20-24 cmH20] 22 cmH20  INTAKE / OUTPUT: I/O last 3 completed shifts: In: 4295.3 [I.V.:2380.3; NG/GT:1815; IV Piggyback:100] Out: 5945 [Urine:4245; Emesis/NG output:1700]  PHYSICAL EXAMINATION: General: alert Neuro: follows commands HEENT: edema and scleral injection Lt eye, no discharge Cardiac: regular, no murmur Chest: better air movement Abd: soft, non tender Ext: 1+ edema Skin: no rashes  LABS:  BMET  Recent Labs Lab 11/22/15 0428 11/23/15 0500 11/23/15 1330 11/24/15 0455  NA 136 141  --  146*  K 2.9* 2.9* 3.5 3.2*  CL 89* 99*  --  105  CO2 34* 33*  --  31  BUN 25* 38*  --  49*  CREATININE 0.88 0.96  --  0.96  GLUCOSE 115* 138*  --  121*   Electrolytes  Recent Labs Lab 11/20/15 0411  11/21/15 0555 11/22/15 0428 11/23/15 0500 11/24/15 0455  CALCIUM 8.2*  < > 8.1* 8.9 9.0 9.2  MG 1.9  --  1.9 2.2  --  2.7*  PHOS 1.9*  --  3.5 3.9  --   --   < > = values in this interval not displayed. CBC  Recent Labs Lab 11/22/15 0428 11/23/15 0500 11/24/15 0455  WBC 8.3 11.8* 12.3*  HGB 11.8* 12.3* 11.8*  HCT 36.1* 37.3* 37.2*  PLT 142* 87* 111*   Coag's  Recent Labs Lab 11/17/15 1032  APTT 41*  INR 1.19   Sepsis Markers No results for input(s): LATICACIDVEN, PROCALCITON, O2SATVEN  in the last 168 hours. ABG  Recent Labs Lab 11/21/15 0322 11/22/15 1050 11/23/15 0400  PHART 7.590* 7.503* 7.491*  PCO2ART 42.4 44.1 43.6  PO2ART 159.0* 80.7* 82.7*    Liver Enzymes  Recent Labs Lab 11/17/15 1032 11/19/15 0344  AST 108* 45*  ALT 79* 43  ALKPHOS 44 40  BILITOT 0.5 0.4  ALBUMIN 3.3* 2.5*    Cardiac Enzymes  Recent Labs Lab 11/17/15 1032 11/17/15 1730  TROPONINI 1.67* 0.89*   Glucose  Recent Labs Lab 11/23/15 1204 11/23/15 1621 11/23/15 1944 11/24/15 0021 11/24/15 0313 11/24/15 0812  GLUCAP 141* 126* 129* 138* 113* 130*   Imaging Dg Chest Port 1 View  Result Date: 11/24/2015 CLINICAL DATA:  Respiratory failure. EXAM: PORTABLE CHEST 1 VIEW COMPARISON:  11/23/2015 FINDINGS: Endotracheal tube remains in place with tip just a below the level of the clavicular heads, well above the carina. Left jugular central venous catheter is unchanged and terminates over the upper SVC. Enteric tube courses towards the upper abdomen with tip not imaged. Cardiac silhouette is upper limits of normal to mildly enlarged in size, unchanged. Lung volumes remain diminished with mildly increased patchy opacity in the left lung base. No  sizable pleural effusion or pneumothorax is identified. IMPRESSION: Low lung volumes with mildly increased left basilar opacity, likely atelectasis. Electronically Signed   By: Sebastian AcheAllen  Grady M.D.   On: 11/24/2015 08:13     STUDIES:  8/27  LHC >> clean coronary arteries, normal LVEDP, normal LV function 8/28  CT head >> no acute intracranial abnormalites; fluid in the paranasal sinuses  8/28  TTE >> suboptimal study, LVEF <20%, severe global hypokinesis, mod LVH, bradycardia with PVCs, normal LA size, mild TR, PAP 31mmHg, trivial posterior pericardial effusion 9/02 CT head >> no acute findings 9/02 CT angio chest >> pulmonary edema, bibasilar ATX/consolidation, no PE  CULTURES: 8/27 Sputum >> multiple organisms present, none predominant    ANTIBIOTICS: Unasyn 8/27 >> 8/28 Rocephin 8/28 >>   SIGNIFICANT EVENTS: 8/27 Admit with VF arrest 8/28  Swan placed to 57 cm, PA pressure 28/13.  Rewarmed to normothermia  9/01  Remains sedate, periods of desaturation / thick secretions 9/03 d/c heparin gtt due to low PLT  LINES/TUBES: ETT 8/27 >> 9/04 L IJ TLC 8/27 >>  R IJ Cordis 8/28 >> out Swan 8/28 >> 8/31  DISCUSSION: 60 yo male with VF arrest, acute systolic CHF.  No significant CAD on LHC.  ASSESSMENT / PLAN:  PULMONARY A: Acute Hypoxic Respiratory Failure 2nd to acute pulmonary edema and aspiration pneumonia. P:   Proceed with extubation 9/04 Continue lasix to 40 mg daily F/u CXR intermittently   CARDIOVASCULAR A:  Cardiac arrest with VF. Cardiogenic shock >> resolved. Acute systolic CHF. A fib with RVR. P:  Continue amiodarone per cardioloyg Hold heparin gtt in setting of low PLT  RENAL A:   Hypokalemia. Hypomagnesemia. P:   Replace electrolytes as needed  GASTROINTESTINAL A:   Nutrition. Melanotic stool >> noted 9/03. Gagging. P:   Advance diet after extubation. Prn zofran Protonix Might need further GI evaluation at some point  HEMATOLOGIC A:   Thrombocytopenia. 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 P:   D/c Abx after dose 9/04   ENDOCRINE A:   Hyperglycemia >> improved. P:   D/c SSI and monitor blood sugar on BMET  NEUROLOGIC A:   Acute metabolic encephalopathy >> resolved. Hx Back Surgery with Implanted Stimulator. P:   Prn fentanyl  OPTHALMOLOGY A: OS scleritis. P: Tobramycin gtt  Updated pt's son at bedside.  D/w Dr. Excell Seltzerooper.  CC time 31 minutes  Coralyn HellingVineet Makenze Ellett, MD [facility] 11/24/2015, 8:57 AM Pager:  [phone] After 3pm call: [phone]

## 2015-11-24 NOTE — Progress Notes (Signed)
eLink Physician-Brief Progress Note Patient Name: Arthur Chase DOB: January 18, 1956 MRN: [id number]   Date of Service  11/24/2015  HPI/Events of Note  Camera check on patient after extubation at approximately 9:18 AM. Patient resting comfortably in the intensive care unit on nasal cannula oxygen. Respiratory status & vitals stable.   eICU Interventions  Continuing ICU monitoring.      Intervention Category Major Interventions: Respiratory failure - evaluation and management  Lawanda Cousins 11/24/2015, 4:14 PM

## 2015-11-24 NOTE — Procedures (Signed)
Extubation Procedure Note  Patient Details:   Name: Arthur Chase DOB: [birth year] MRN: [id number]   Airway Documentation:     Evaluation  O2 sats: stable throughout Complications: No apparent complications Patient did tolerate procedure well. Bilateral Breath Sounds: Clear   Yes   Pt. Was extubated to a 6L [place] without any complications, dyspnea or stridor noted. Pt. Was instructed on IS x 5, highest goal achieved was .  Beyza Bellino, Margaretmary Dys 11/24/2015, 9:18 AM

## 2015-11-24 NOTE — Progress Notes (Signed)
    Subjective:  Intubated, denies pain  Objective:  Vital Signs in the last 24 hours: Temp:  [98.4 F (36.9 C)-101.3 F (38.5 C)] 99.1 F (37.3 C) (09/04 0800) Pulse Rate:  [73-99] 73 (09/04 0800) Resp:  [15-27] 16 (09/04 0800) BP: (98-157)/(60-113) 121/70 (09/04 0800) SpO2:  [91 %-99 %] 97 % (09/04 0800) FiO2 (%):  [50 %-60 %] 50 % (09/04 0333) Weight:  [102.7 kg (226 lb 6.6 oz)] 102.7 kg (226 lb 6.6 oz) (09/04 0422)  Intake/Output from previous day: 09/03 0701 - 09/04 0700 In: 2431.3 [I.V.:1251.3; NG/GT:1130; IV Piggyback:50] Out: 3320 [Urine:3320]  Physical Exam: Pt is intubated but awake, responds to command HEENT: normal Neck: JVP - normal Lungs: Coarse bilaterally CV: RRR without murmur or gallop Abd: soft, NT, Positive BS, no hepatomegaly Ext: no C/C/E, distal pulses intact and equal Skin: warm/dry no rash  Lab Results:  Recent Labs  11/23/15 0500 11/24/15 0455  WBC 11.8* 12.3*  HGB 12.3* 11.8*  PLT 87* 111*    Recent Labs  11/23/15 0500 11/23/15 1330 11/24/15 0455  NA 141  --  146*  K 2.9* 3.5 3.2*  CL 99*  --  105  CO2 33*  --  31  GLUCOSE 138*  --  121*  BUN 38*  --  49*  CREATININE 0.96  --  0.96   No results for input(s): TROPONINI in the last 72 hours.  Invalid input(s): CK, MB  Tele: Sinus rhythm  Assessment/Plan:  1. Acute respiratory failure/VDRF 2. Out-of-hospital cardiac arrest hospital day #8 3. Acute systolic heart failure with acute cardiogenic shock   EF 25-30 on echo 8/31 4. Pulmonary edema - oxygenation improving 5. VF/nonsustained VT - rhythm now stable on IV amiodarone 6. Anoxic encephalopathy secondary to OOH arrest - neurology team following - clinically improving 7. Atrial fibrillation with RVR - has converted back to sinus on IV amiodarone - amio dose reduced to 30 mg/hr yesterday 8. Hypokalemia - K+ 3.2 this am 9. Thrombocytopenia - stable now off heparin  Continues to make progress. On IV lasix and diuresing  well. Now also no IV enalapril and oral aldactone. 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 Use bivalirudin if need for anticoagulation recurs. Making progress - timing of extubation per CCM. FiO2 0.4, PEEP decreased. Would keep on IV amiodarone today - consider switch to oral amiodarone after he is extubated.   Son updated at bedside.  The patient is critically ill with multiple organ systems failure and requires high complexity decision making for assessment and support, frequent evaluation and titration of therapies, application of advanced monitoring technologies and extensive interpretation of multiple databases.   Critical Care Time devoted to patient care services described in this note is 35 minutes.   Tonny Bollman, M.D. 11/24/2015, 8:43 AM

## 2015-11-24 NOTE — Progress Notes (Signed)
Fentanyl 200 ml wasted by 2 Rn. Lexi and Tk.

## 2015-11-24 NOTE — Progress Notes (Signed)
Neurologically patient has resolved. Neurology will S/O.  Call with questions.   Felicie Morn PA-C [facility] [phone]  11/24/2015, 9:10 AM

## 2015-11-25 ENCOUNTER — Encounter (HOSPITAL_COMMUNITY): Payer: Self-pay | Admitting: *Deleted

## 2015-11-25 ENCOUNTER — Inpatient Hospital Stay (HOSPITAL_COMMUNITY): Payer: Managed Care, Other (non HMO)

## 2015-11-25 DIAGNOSIS — D7582 Heparin induced thrombocytopenia (HIT): Secondary | ICD-10-CM

## 2015-11-25 DIAGNOSIS — I472 Ventricular tachycardia, unspecified: Secondary | ICD-10-CM

## 2015-11-25 DIAGNOSIS — I4901 Ventricular fibrillation: Principal | ICD-10-CM

## 2015-11-25 DIAGNOSIS — E876 Hypokalemia: Secondary | ICD-10-CM | POA: Diagnosis not present

## 2015-11-25 DIAGNOSIS — I5021 Acute systolic (congestive) heart failure: Secondary | ICD-10-CM

## 2015-11-25 DIAGNOSIS — I5032 Chronic diastolic (congestive) heart failure: Secondary | ICD-10-CM

## 2015-11-25 DIAGNOSIS — I4729 Other ventricular tachycardia: Secondary | ICD-10-CM

## 2015-11-25 LAB — BASIC METABOLIC PANEL
ANION GAP: 7 (ref 5–15)
BUN: 44 mg/dL — ABNORMAL HIGH (ref 6–20)
CHLORIDE: 111 mmol/L (ref 101–111)
CO2: 31 mmol/L (ref 22–32)
Calcium: 9.2 mg/dL (ref 8.9–10.3)
Creatinine, Ser: 0.94 mg/dL (ref 0.61–1.24)
GFR calc Af Amer: >60 mL/min (ref >60)
GFR calc non Af Amer: >60 mL/min (ref >60)
GLUCOSE: 110 mg/dL — AB (ref 65–99)
POTASSIUM: 3.2 mmol/L — AB (ref 3.5–5.1)
Sodium: 149 mmol/L — ABNORMAL HIGH (ref 135–145)

## 2015-11-25 LAB — CBC
HEMATOCRIT: 39 % (ref 39.0–52.0)
HEMOGLOBIN: 12.3 g/dL — AB (ref 13.0–17.0)
MCH: 31.9 pg (ref 26.0–34.0)
MCHC: 31.5 g/dL (ref 30.0–36.0)
MCV: 101.3 fL — AB (ref 78.0–100.0)
Platelets: 137 10*3/uL — ABNORMAL LOW (ref 150–400)
RBC: 3.85 MIL/uL — AB (ref 4.22–5.81)
RDW: 14.2 % (ref 11.5–15.5)
WBC: 13 10*3/uL — AB (ref 4.0–10.5)

## 2015-11-25 MED ORDER — POTASSIUM CHLORIDE 10 MEQ/50ML IV SOLN
10.0000 meq | INTRAVENOUS | Status: AC
  Administered 2015-11-25 (×2): 10 meq via INTRAVENOUS
  Filled 2015-11-25 (×2): qty 50

## 2015-11-25 MED ORDER — LOSARTAN POTASSIUM 25 MG PO TABS
25.0000 mg | ORAL_TABLET | Freq: Every day | ORAL | Status: DC
  Administered 2015-11-25 – 2015-12-03 (×9): 25 mg via ORAL
  Filled 2015-11-25 (×9): qty 1

## 2015-11-25 MED ORDER — AMIODARONE HCL 200 MG PO TABS
400.0000 mg | ORAL_TABLET | Freq: Two times a day (BID) | ORAL | Status: DC
  Administered 2015-11-25: 400 mg via ORAL
  Filled 2015-11-25: qty 2

## 2015-11-25 MED ORDER — PANTOPRAZOLE SODIUM 40 MG PO TBEC
40.0000 mg | DELAYED_RELEASE_TABLET | Freq: Every day | ORAL | Status: DC
  Administered 2015-11-25 – 2015-12-03 (×9): 40 mg via ORAL
  Filled 2015-11-25 (×9): qty 1

## 2015-11-25 MED ORDER — ENSURE ENLIVE PO LIQD: 237.0000 mL | ORAL | Status: DC | PRN

## 2015-11-25 MED ORDER — PANTOPRAZOLE SODIUM 40 MG PO PACK: 40.0000 mg | PACK | Freq: Every day | ORAL | Status: DC

## 2015-11-25 MED ORDER — ADULT MULTIVITAMIN W/MINERALS CH
1.0000 | ORAL_TABLET | Freq: Every day | ORAL | Status: DC
  Administered 2015-11-25 – 2015-12-03 (×9): 1 via ORAL
  Filled 2015-11-25 (×9): qty 1

## 2015-11-25 NOTE — Consult Note (Signed)
ELECTROPHYSIOLOGY CONSULT NOTE    Patient ID: Arthur Chase MRN: [id number], DOB/AGE: 07/09/1955 60 y.o.  Admit date: 11/16/2015 Date of Consult: 11/25/2015  Primary Physician: No primary care provider on file. Primary Cardiologist: new to [facility] Excell Seltzer) Requesting MD: Herbie Baltimore  Reason for Consultation: VF arrest  HPI:  Arthur Chase is a 60 y.o. male with no significant past medical history. He was at home on the day of admission with his children.  He had walked to the refrigerator and gotten a beer then walked back to sit down to watch the race.  He doesn't remember any heart racing, shortness of breath, or chest pain preceding syncope.  His children heard him breathing heavily from another room and found him unresponsive.  They called 911 and started CPR.  On fire departments arrival, he was in VF and received AED shock.  He then had recurrent VT/VF en route to the hospital requiring cardioversion.  He underwent urgent cardiac catheterization with no obstructive CAD. He underwent cooling and has been rewarmed with neurologic recovery.  EP has been asked to evaluate for treatment options.   He has not had prior syncope, dizziness, or pre-syncope.  No palpitations.  No recent fevers, chills, nausea or vomiting. There is no family history of sudden death.    Echo this admission demonstrated EF 25-30%, severe biventricular dysfunction.   Past Medical History:  Diagnosis Date  . Atrial fibrillation with rapid ventricular response (HCC) 11/20/2015     Surgical History:  Past Surgical History:  Procedure Laterality Date  . CARDIAC CATHETERIZATION N/A 11/16/2015   Procedure: Left Heart Cath and Coronary Angiography;  Surgeon: Peter M Swaziland, MD;  Location: [facility] INVASIVE CV LAB;  Service: Cardiovascular;  Laterality: N/A;     Prescriptions Prior to Admission  Medication Sig Dispense Refill Last Dose  . lisinopril-hydrochlorothiazide (PRINZIDE,ZESTORETIC) 20-25 MG tablet Take 1  tablet by mouth daily.   maybe 8/27  . Multiple Vitamin (MULTIVITAMIN WITH MINERALS) TABS tablet Take 1 tablet by mouth daily. Centrum Silver   maybe 8/27    Inpatient Medications:  . amiodarone  400 mg Oral BID  . losartan  25 mg Oral Daily  . multivitamin with minerals  1 tablet Oral Daily  . pantoprazole  40 mg Oral Daily  . sodium chloride flush  10-40 mL Intracatheter Q12H  . spironolactone  12.5 mg Oral Daily  . tobramycin  1 drop Left Eye Q6H    Allergies: No Known Allergies  Social History   Social History  . Marital status: Married    Spouse name: N/A  . Number of children: N/A  . Years of education: N/A   Occupational History  . Not on file.   Social History Main Topics  . Smoking status: Not on file  . Smokeless tobacco: Not on file  . Alcohol use Not on file  . Drug use: Unknown  . Sexual activity: Not on file   Other Topics Concern  . Not on file   Social History Narrative  . No narrative on file     Family History: mom - CAD +MI, brother - HTN  Review of Systems: All other systems reviewed and are otherwise negative except as noted above.  Physical Exam: Vitals:   11/25/15 0800 11/25/15 0900 11/25/15 1000 11/25/15 1100  BP:  132/73  126/82  Pulse: 73 70 76 74  Resp: (!) 21 (!) 21 (!) 23 (!) 24  Temp:      TempSrc:  SpO2: 94% 94% 95% 96%  Weight:      Height:        GEN- The patient is obese and chronically ill appearing, alert and oriented x 3 today, +wandering conversation and difficult to stay focused    HEENT: normocephalic, atraumatic; sclera clear, conjunctiva pink; hearing intact; oropharynx clear; neck supple  Lungs- Clear to ausculation bilaterally, normal work of breathing.  No wheezes, rales, rhonchi Heart- Regular rate and rhythm, 2/6 SEM GI- soft, non-tender, non-distended, bowel sounds present  Extremities- no clubbing, cyanosis, or edema; DP/PT/radial pulses 2+ bilaterally MS- no significant deformity or atrophy Skin-  warm and dry, no rash or lesion Psych- euthymic mood, full affect Neuro- strength and sensation are intact  Labs:   Lab Results  Component Value Date   WBC 13.0 (H) 11/25/2015   HGB 12.3 (L) 11/25/2015   HCT 39.0 11/25/2015   MCV 101.3 (H) 11/25/2015   PLT 137 (L) 11/25/2015    Recent Labs Lab 11/19/15 0344  11/25/15 0400  NA 136  < > 149*  K 4.0  < > 3.2*  CL 101  < > 111  CO2 29  < > 31  BUN 17  < > 44*  CREATININE 0.88  < > 0.94  CALCIUM 7.0*  < > 9.2  PROT 4.9*  --   --   BILITOT 0.4  --   --   ALKPHOS 40  --   --   ALT 43  --   --   AST 45*  --   --   GLUCOSE 130*  < > 110*  < > = values in this interval not displayed.    Radiology/Studies: Ct Head Wo Contrast Result Date: 11/22/2015 CLINICAL DATA:  Concern regarding anoxic encephalopathy. Respiratory failure and heart failure. EXAM: CT HEAD WITHOUT CONTRAST TECHNIQUE: Contiguous axial images were obtained from the base of the skull through the vertex without intravenous contrast. COMPARISON:  11/17/2015 FINDINGS: Brain: The brain demonstrates stable CT appearance with no evidence of hemorrhage, infarction, edema, mass effect, extra-axial fluid collection, hydrocephalus or mass lesion. Vascular: No hyperdense vessel or unexpected calcification. Skull: The skull is unremarkable. Sinuses/Orbits: Mucosal thickening within both maxillary antra, left greater than right. Additional mucosal thickening in bilateral ethmoid and sphenoid air cells. IMPRESSION: Stable head CT demonstrating no acute findings or evidence of visible infarcts. Mucosal sinus thickening. Electronically Signed   By: Irish LackGlenn  Yamagata M.D.   On: 11/22/2015 16:03   Dg Chest Port 1 View Result Date: 11/24/2015 CLINICAL DATA:  Respiratory failure. EXAM: PORTABLE CHEST 1 VIEW COMPARISON:  11/23/2015 FINDINGS: Endotracheal tube remains in place with tip just a below the level of the clavicular heads, well above the carina. Left jugular central venous catheter is  unchanged and terminates over the upper SVC. Enteric tube courses towards the upper abdomen with tip not imaged. Cardiac silhouette is upper limits of normal to mildly enlarged in size, unchanged. Lung volumes remain diminished with mildly increased patchy opacity in the left lung base. No sizable pleural effusion or pneumothorax is identified. IMPRESSION: Low lung volumes with mildly increased left basilar opacity, likely atelectasis. Electronically Signed   By: Sebastian AcheAllen  Grady M.D.   On: 11/24/2015 08:13   EKG: sinus rhythm, rate 92, LVH   TELEMETRY: sinus rhythm  Assessment/Plan: 1.  VT/VF arrest The patient had a resuscitated VF arrest.  EMS strips demonstrate multiple episodes of VT/VF with a cycle length ~27680msec. Catheterization with no ischemic cause. EF depressed on no home  meds He meets criteria for ICD for secondary prevention. Discussed briefly with patient and sons today.    Discontinue amiodarone Cardiac MRI tomorrow morning QT prolonged intermittently during admission - will update EKG today Keep K>3.8, Mg >1.8 No driving x6 months - pt and sons aware  2.  NICM Continue to optimize medical therapy Add BB when able  3.  Paroxysmal atrial fibrillation Newly diagnosed this admission in the setting of cardiac arrest CHADS2VASC is 2 Would monitor for recurrence as patient clinically improves  4.  HTN Stable No change required today  D/C central line when able (would like to have out for 24 hours prior to ICD implant).  Dr Johney Frame to see later today    Signed, Gypsy Balsam, NP 11/25/2015 11:38 AM  I have seen, examined the patient, and reviewed the above assessment and plan.  Would plan MRI tomorrow to evaluate nonischemic CM and VF arrest further.  QT is prolonged but in the setting of recent amiodarone use.  Changes to above are made where necessary.  Will stop amiodarone.  DC CVL.  Cardiac MRI.  Anticipate ICD tomorrow with Dr Patrina Levering Sign: Hillis Range,  MD 11/25/2015 6:08 PM

## 2015-11-25 NOTE — Evaluation (Signed)
Clinical/Bedside Swallow Evaluation Patient Details  Name: Arthur Chase MRN: [id number] Date of Birth: 09-09-1955  Today's Date: 11/25/2015 Time: SLP Start Time (ACUTE ONLY): 0459 SLP Stop Time (ACUTE ONLY): 1001 SLP Time Calculation (min) (ACUTE ONLY): 35 min  Past Medical History:  Past Medical History:  Diagnosis Date  . Atrial fibrillation with rapid ventricular response (HCC) 11/20/2015   Past Surgical History:  Past Surgical History:  Procedure Laterality Date  . CARDIAC CATHETERIZATION N/A 11/16/2015   Procedure: Left Heart Cath and Coronary Angiography;  Surgeon: Peter M Swaziland, MD;  Location: [facility] INVASIVE CV LAB;  Service: Cardiovascular;  Laterality: N/A;   HPI:  Pt is 60 y.o male with obesity and hypertension who was admitted due to cardiac arrest. Pt was intubated for 8 days and was extubated on 9/4.   Assessment / Plan / Recommendation Clinical Impression  Pt seen post 8 day intubation. Upon arrival pt had dysphonic and breathy vocal quality, but was able to elicit a strong volitional cough. Pt followed commands with minimal cueing throughout swallow evaluation. Pt consumed ice chips without overt difficulty. During thin liquid and puree trials pt displayed intermittent delayed throat clearing and coughing. Pt used oral suction and secretions showed minimal puree residue. During regular solid trials, pt displayed delayed coughing. Throughout evaluation, pt's vocal intensity improved and his vocal quality remained clear. Of note coughing has been documented prior to study, thus this may be his baseline. Recommend MBS for further analysis of pt's swallowing to assess for aspiration risk given recent intubation and age. Pt can consume ice chips and small sips of water until MBS (notified RN).    Aspiration Risk  Mild aspiration risk    Diet Recommendation NPO except meds;Ice chips PRN after oral care   Liquid Administration via: Straw;Cup Medication Administration: Crushed  with puree Supervision: Staff to assist with self feeding Compensations: Minimize environmental distractions;Slow rate;Small sips/bites Postural Changes: Seated upright at 90 degrees;Remain upright for at least 30 minutes after po intake    Other  Recommendations Oral Care Recommendations: Oral care BID Other Recommendations: Have oral suction available   Follow up Recommendations  24 hour supervision/assistance    Frequency and Duration            Prognosis Prognosis for Safe Diet Advancement: Good Barriers to Reach Goals: Cognitive deficits      Swallow Study   General HPI: Pt is 60 y.o male with obesity and hypertension who was admitted due to cardiac arrest. Pt was intubated for 8 days and was extubated on 9/4. Type of Study: Bedside Swallow Evaluation Diet Prior to this Study: NPO Temperature Spikes Noted: No Respiratory Status: Nasal cannula History of Recent Intubation: Yes Length of Intubations (days): 8 days Date extubated: 11/24/15 Behavior/Cognition: Alert;Pleasant mood;Cooperative Oral Cavity Assessment: Within Functional Limits Oral Care Completed by SLP: No Oral Cavity - Dentition: Adequate natural dentition Self-Feeding Abilities: Able to feed self;Needs assist Patient Positioning: Upright in bed Baseline Vocal Quality: Low vocal intensity (dysphonic) Volitional Cough: Strong Volitional Swallow: Able to elicit    Oral/Motor/Sensory Function Overall Oral Motor/Sensory Function: Within functional limits   Ice Chips Ice chips: Within functional limits Presentation: Spoon;Self Fed   Thin Liquid Thin Liquid: Impaired Presentation: Straw;Cup Pharyngeal  Phase Impairments: Cough - Delayed;Throat Clearing - Delayed    Nectar Thick Nectar Thick Liquid: Not tested   Honey Thick Honey Thick Liquid: Not tested   Puree Puree: Impaired Presentation: Self Fed;Spoon Pharyngeal Phase Impairments: Cough - Delayed;Throat Clearing - Delayed;Other (  comments) (applesauce in  suction tubing)   Solid   GO   Solid: Impaired Presentation: Self Fed Pharyngeal Phase Impairments: Cough - Delayed       Tollie EthHaleigh Ragan Mazikeen Hehn, Student SLP  Caryl NeverHaleigh R Delia Slatten 11/25/2015,10:55 AM

## 2015-11-25 NOTE — Progress Notes (Signed)
Transferred in from 2H13 by bed, made comfortable on bed, call light within reach.

## 2015-11-25 NOTE — Progress Notes (Signed)
MBSS complete. Full report located under chart review in imaging section.  Early Steel Paiewonsky, M.A. CCC-SLP [phone]  

## 2015-11-25 NOTE — Progress Notes (Addendum)
Nutrition Follow-up  DOCUMENTATION CODES:   Obesity unspecified  INTERVENTION:   Follow pt tolerance of DYS 2 diet and follow diet advancement as it occurs.  Discussed with pt the need for education regarding a low-sodium diet prior to d/c.  NUTRITION DIAGNOSIS:   Inadequate oral intake related to poor appetite as evidenced by other (see comment) (diet recently advanced from NPO to Dysphagia 2).  GOAL:   Patient will meet greater than or equal to 90% of their needs  MONITOR:   PO intake, Diet advancement, I & O's  ASSESSMENT:   60 y.o. Male with obesity, HTN, brought to the [facility] ED on 8/27 after a cardiac arrest at home. Total time of CPR estimated to be 16 minutes. Found to be in Vfib on 3 separate occasions, each successfully defibrillated.   Pt extubated 9/4. Currently on nasal cannula. Modified barium swallow completed on 9/5. Results show that pt presents with mild pharyngeal dysphagia. SLP recommended DYS 2 with thin liquids. Diet advancement likely.  Pt has experienced 30# weight loss since 8/27 likely explained by -10 L fluid since admission.  Spoke with pt at bedside while he was consuming first DYS 2 meal. Pt reports consuming a high-sodium diet PTA. Pt described breakfast as a biscuit with sausage gravy, a sausage patty, and 2% milk. Pt described lunch as takeout from a local [employer]. Pt periodically consumes dinner only if he is hungry.  Meds: aldactone (K-sparing diuretic), colace, dulcolax Labs: elevated glucose (110), low potassium (3.2)  Diet Order:  DIET DYS 2 Room service appropriate? Yes; Fluid consistency: Thin  Skin:  Reviewed, no issues  Last BM:  9/5  Height:   Ht Readings from Last 1 Encounters:  11/19/15 5\' 9"  (1.753 m)   Weight:   Wt Readings from Last 1 Encounters:  11/25/15 219 lb 12.8 oz (99.7 kg)   Ideal Body Weight:  75.45 kg  BMI:  Body mass index is 32.46 kg/m.  Estimated Nutritional Needs:   Kcal:   1700-1900  Protein:  95-105 grams  Fluid:  >/= 1.7 L/day  EDUCATION NEEDS:   Education needs not appropriate at this time  Rosemarie Ax Dietetic Intern Pager Number: [phone]

## 2015-11-25 NOTE — Progress Notes (Signed)
PULMONARY / CRITICAL CARE MEDICINE   Name: Arthur Chase D Premo MRN: [id number] DOB: 08/11/1955    ADMISSION DATE:  11/16/2015 CONSULTATION DATE:  11/16/2015  REFERRING MD:  Dr. Peter SwazilandJordan  CHIEF COMPLAINT:  Cardiac arrest  SUBJECTIVE:    Did well with pressure support.  VITAL SIGNS: BP 132/73   Pulse 76   Temp 98.6 F (37 C) (Oral)   Resp (!) 23   Ht 5\' 9"  (1.753 m)   Wt 99.7 kg (219 lb 12.8 oz)   SpO2 95%   BMI 32.46 kg/m   HEMODYNAMICS: CVP:  [3 mmHg-9 mmHg] 5 mmHg  VENTILATOR SETTINGS:    INTAKE / OUTPUT: I/O last 3 completed shifts: In: 2237.9 [I.V.:1357.9; NG/GT:630; IV Piggyback:250] Out: 4020 [Urine:4020]  PHYSICAL EXAMINATION: General: alert Neuro: follows commands HEENT: edema and scleral injection Lt eye, no discharge Cardiac: regular, no murmur Chest: better air movement Abd: soft, non tender Ext: 1+ edema Skin: no rashes  LABS:  BMET  Recent Labs Lab 11/23/15 0500 11/23/15 1330 11/24/15 0455 11/25/15 0400  NA 141  --  146* 149*  K 2.9* 3.5 3.2* 3.2*  CL 99*  --  105 111  CO2 33*  --  31 31  BUN 38*  --  49* 44*  CREATININE 0.96  --  0.96 0.94  GLUCOSE 138*  --  121* 110*   Electrolytes  Recent Labs Lab 11/20/15 0411  11/21/15 0555 11/22/15 0428 11/23/15 0500 11/24/15 0455 11/25/15 0400  CALCIUM 8.2*  < > 8.1* 8.9 9.0 9.2 9.2  MG 1.9  --  1.9 2.2  --  2.7*  --   PHOS 1.9*  --  3.5 3.9  --   --   --   < > = values in this interval not displayed. CBC  Recent Labs Lab 11/23/15 0500 11/24/15 0455 11/25/15 0400  WBC 11.8* 12.3* 13.0*  HGB 12.3* 11.8* 12.3*  HCT 37.3* 37.2* 39.0  PLT 87* 111* 137*   Coag's No results for input(s): APTT, INR in the last 168 hours. Sepsis Markers No results for input(s): LATICACIDVEN, PROCALCITON, O2SATVEN in the last 168 hours. ABG  Recent Labs Lab 11/21/15 0322 11/22/15 1050 11/23/15 0400  PHART 7.590* 7.503* 7.491*  PCO2ART 42.4 44.1 43.6  PO2ART 159.0* 80.7* 82.7*    Liver  Enzymes  Recent Labs Lab 11/19/15 0344  AST 45*  ALT 43  ALKPHOS 40  BILITOT 0.4  ALBUMIN 2.5*    Cardiac Enzymes No results for input(s): TROPONINI, PROBNP in the last 168 hours. Glucose  Recent Labs Lab 11/23/15 1204 11/23/15 1621 11/23/15 1944 11/24/15 0021 11/24/15 0313 11/24/15 0812  GLUCAP 141* 126* 129* 138* 113* 130*   Imaging No results found.   STUDIES:  8/27  LHC >> clean coronary arteries, normal LVEDP, normal LV function 8/28  CT head >> no acute intracranial abnormalites; fluid in the paranasal sinuses  8/28  TTE >> suboptimal study, LVEF <20%, severe global hypokinesis, mod LVH, bradycardia with PVCs, normal LA size, mild TR, PAP 31mmHg, trivial posterior pericardial effusion 9/02 CT head >> no acute findings 9/02 CT angio chest >> pulmonary edema, bibasilar ATX/consolidation, no PE  CULTURES: 8/27 Sputum >> multiple organisms present, none predominant   ANTIBIOTICS: Unasyn 8/27 >> 8/28 Rocephin 8/28 >>   SIGNIFICANT EVENTS: 8/27 Admit with VF arrest 8/28  Swan placed to 57 cm, PA pressure 28/13.  Rewarmed to normothermia  9/01  Remains sedate, periods of desaturation / thick secretions 9/03 d/c heparin gtt  due to low PLT  LINES/TUBES: ETT 8/27 >> 9/04 L IJ TLC 8/27 >>  R IJ Cordis 8/28 >> out Swan 8/28 >> 8/31  I reviewed CXR myself, improvement in infiltrate.  DISCUSSION: 60 yo male with VF arrest, acute systolic CHF.  No significant CAD on LHC.  ASSESSMENT / PLAN:  PULMONARY A: Acute Hypoxic Respiratory Failure 2nd to acute pulmonary edema and aspiration pneumonia. P:   Titrate O2 for sat of 88-92% D/C lasix. F/u CXR intermittently   CARDIOVASCULAR A:  Cardiac arrest with VF. Cardiogenic shock >> resolved. Acute systolic CHF. A fib with RVR. P:  Amio to PO per cards. Hold heparin gtt in setting of low PLT  RENAL A:   Hypokalemia. Hypomagnesemia. P:   Replace electrolytes as needed Hold lasix. KVO  IVF  GASTROINTESTINAL A:   Nutrition. Melanotic stool >> noted 9/03. Gagging. P:   Advance diet after extubation. Prn zofran Protonix Might need further GI evaluation at some point, Hg is stable for now.  HEMATOLOGIC A:   Thrombocytopenia. 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 P:   D/c Abx after dose 9/04   ENDOCRINE A:   Hyperglycemia >> improved. P:   D/c SSI and monitor blood sugar on BMET  NEUROLOGIC A:   Acute metabolic encephalopathy >> resolved. Hx Back Surgery with Implanted Stimulator. P:   Prn fentanyl  OPTHALMOLOGY A: OS scleritis. P: Tobramycin gtt  Patient updated bedside.  D/w Dr. Herbie Baltimore, will transfer to SDU and to card's service with PCCM off 9/5.  Alyson Reedy, M.D. [facility] Pulmonary/Critical Care Medicine. Pager: [phone]. After hours pager: [phone].

## 2015-11-25 NOTE — Progress Notes (Signed)
Patient Name: Arthur Chase Date of Encounter: 11/25/2015  Hospital Problem List     Principal Problem:   Cardiac arrest [facility]) Active Problems:   Acute respiratory failure with hypoxemia (HCC)   Acute encephalopathy   Cardiogenic shock (HCC)   Anoxic brain injury (HCC)   Acute systolic CHF (congestive heart failure) (HCC)   VF (ventricular fibrillation) (HCC)   Atrial fibrillation with rapid ventricular response (HCC)   Hypokalemia   NSVT (nonsustained ventricular tachycardia) (HCC)   Thrombocytopenia (HCC)    Subjective   No chest pain or dyspnea.  Has been coughing some.  Overall feels well.  Inpatient Medications    . enalaprilat  0.625 mg Intravenous TID  . furosemide  40 mg Intravenous BID  . multivitamin  15 mL Per Tube Daily  . pantoprazole sodium  40 mg Per Tube Daily  . sodium chloride flush  10-40 mL Intracatheter Q12H  . spironolactone  12.5 mg Oral Daily  . tobramycin  1 drop Left Eye Q6H    Vital Signs    Vitals:   11/25/15 0500 11/25/15 0600 11/25/15 0700 11/25/15 0800  BP: 133/78 130/80 128/74   Pulse: 74 71 72 73  Resp: (!) 21 (!) 22 19 (!) 21  Temp:      TempSrc:      SpO2: 91% 92% 92% 94%  Weight: 219 lb 12.8 oz (99.7 kg)     Height:        Intake/Output Summary (Last 24 hours) at 11/25/15 0823 Last data filed at 11/25/15 0800  Gross per 24 hour  Intake            874.1 ml  Output             2500 ml  Net          -1625.9 ml   Filed Weights   11/23/15 0436 11/24/15 0422 11/25/15 0500  Weight: 238 lb 1.6 oz (108 kg) 226 lb 6.6 oz (102.7 kg) 219 lb 12.8 oz (99.7 kg)    Physical Exam    GEN: Well nourished, well developed, in no acute distress.  HEENT: normal.  Neck: Supple, no JVD, carotid bruits, or masses. Cardiac: RRR, no murmurs, rubs, or gallops. No clubbing, cyanosis, edema.  Radials/DP/PT 2+ and equal bilaterally.  Respiratory:  Respirations regular and unlabored, clear to auscultation bilaterally. GI: Soft, nontender,  nondistended, BS + x 4. MS: no deformity or atrophy. Skin: warm and dry, no rash. Neuro:  Strength and sensation are intact. Psych: Normal affect.  Labs    CBC  Recent Labs  11/24/15 0455 11/25/15 0400  WBC 12.3* 13.0*  HGB 11.8* 12.3*  HCT 37.2* 39.0  MCV 102.8* 101.3*  PLT 111* 137*   Basic Metabolic Panel  Recent Labs  11/24/15 0455 11/25/15 0400  NA 146* 149*  K 3.2* 3.2*  CL 105 111  CO2 31 31  GLUCOSE 121* 110*  BUN 49* 44*  CREATININE 0.96 0.94  CALCIUM 9.2 9.2  MG 2.7*  --     Telemetry    Rsr, pvc's.  Radiology    Dg Chest Port 1 View  Result Date: 11/24/2015 CLINICAL DATA:  Respiratory failure. EXAM: PORTABLE CHEST 1 VIEW COMPARISON:  11/23/2015 FINDINGS: Endotracheal tube remains in place with tip just a below the level of the clavicular heads, well above the carina. Left jugular central venous catheter is unchanged and terminates over the upper SVC. Enteric tube courses towards the upper abdomen with tip not  imaged. Cardiac silhouette is upper limits of normal to mildly enlarged in size, unchanged. Lung volumes remain diminished with mildly increased patchy opacity in the left lung base. No sizable pleural effusion or pneumothorax is identified. IMPRESSION: Low lung volumes with mildly increased left basilar opacity, likely atelectasis. Electronically Signed   By: Sebastian AcheAllen  Grady M.D.   On: 11/24/2015 08:13    Patient Summary     60 y/o ? admitted 8/27 in setting of OOH cardiac arrest (nl cors) complicated by VDRF (extubated 9/4), CGS (EF 25-30%), Systolic CHF, VF/NSVT & AF on amio (now sinus), and thrombocytopenia.  Assessment & Plan    1.  Out of hospital cardiac arrest/VF/NSVT: Unclear etiology.  Nl cors on cath 8/27. S/P cooling. Extubated 9/4.  Neuro s/o. VT quiescent on amio - can plan to switch to po now that he is taking po's.  2.  Acute resp failure/VDRF:  Extubated 9/4.  Per CCM.  3.  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  Minus 9.4L this  admission.  Wt down to 219 from peak of 270.  Creat stable.  No dyspnea.  Lungs clear.  CVP 3-5.  Hold lasix this am.  He has been taking PO's.  Can look to switch enalapril.  Can likely start with low dose valsartan with eventual plan to transition to entresto 36 hrs after last enalapril (6am this morning) if bp stable.  Will also look to add low dose  blocker depending on how he does with oral ARB.  4.  Anoxic Encephalopathy:  Mild confusion but overall alert and responsive.  Neuro has signed off.  5.  Hypokalemia: supp.  6.  Thrombocytopenia:  Stable.  7.  PAF:  Maintaining sinus on amio.  Signed, Nicolasa Duckinghristopher Berge NP

## 2015-11-26 ENCOUNTER — Encounter (HOSPITAL_COMMUNITY): Payer: Self-pay

## 2015-11-26 ENCOUNTER — Inpatient Hospital Stay (HOSPITAL_COMMUNITY): Payer: Managed Care, Other (non HMO)

## 2015-11-26 DIAGNOSIS — I469 Cardiac arrest, cause unspecified: Secondary | ICD-10-CM

## 2015-11-26 LAB — BASIC METABOLIC PANEL
Anion gap: 14 (ref 5–15)
BUN: 39 mg/dL — AB (ref 6–20)
CHLORIDE: 106 mmol/L (ref 101–111)
CO2: 27 mmol/L (ref 22–32)
Calcium: 8.7 mg/dL — ABNORMAL LOW (ref 8.9–10.3)
Creatinine, Ser: 1.04 mg/dL (ref 0.61–1.24)
GFR calc Af Amer: >60 mL/min (ref >60)
GFR calc non Af Amer: >60 mL/min (ref >60)
Glucose, Bld: 109 mg/dL — ABNORMAL HIGH (ref 65–99)
POTASSIUM: 3 mmol/L — AB (ref 3.5–5.1)
SODIUM: 147 mmol/L — AB (ref 135–145)

## 2015-11-26 LAB — MAGNESIUM: MAGNESIUM: 2.7 mg/dL — AB (ref 1.7–2.4)

## 2015-11-26 LAB — PHOSPHORUS: PHOSPHORUS: 5.3 mg/dL — AB (ref 2.5–4.6)

## 2015-11-26 LAB — APTT
APTT: 64 s — AB (ref 24–36)
APTT: 67 s — AB (ref 24–36)
aPTT: 32 seconds (ref 24–36)
aPTT: 36 seconds (ref 24–36)

## 2015-11-26 LAB — CBC
HEMATOCRIT: 39.1 % (ref 39.0–52.0)
HEMOGLOBIN: 12.3 g/dL — AB (ref 13.0–17.0)
MCH: 31.5 pg (ref 26.0–34.0)
MCHC: 31.5 g/dL (ref 30.0–36.0)
MCV: 100 fL (ref 78.0–100.0)
Platelets: 117 10*3/uL — ABNORMAL LOW (ref 150–400)
RBC: 3.91 MIL/uL — AB (ref 4.22–5.81)
RDW: 13.9 % (ref 11.5–15.5)
WBC: 11 10*3/uL — ABNORMAL HIGH (ref 4.0–10.5)

## 2015-11-26 LAB — PROTIME-INR
INR: 1.22
Prothrombin Time: 15.4 seconds — ABNORMAL HIGH (ref 11.4–15.2)

## 2015-11-26 MED ORDER — SODIUM CHLORIDE 0.9 % IV SOLN
INTRAVENOUS | Status: DC
  Administered 2015-11-27: 10:00:00 via INTRAVENOUS

## 2015-11-26 MED ORDER — CHLORHEXIDINE GLUCONATE 4 % EX LIQD
60.0000 mL | Freq: Once | CUTANEOUS | Status: DC
  Filled 2015-11-26: qty 60

## 2015-11-26 MED ORDER — ARGATROBAN 50 MG/50ML IV SOLN
1.3000 ug/kg/min | INTRAVENOUS | Status: DC
  Administered 2015-11-26: 1 ug/kg/min via INTRAVENOUS
  Administered 2015-11-27 – 2015-11-30 (×13): 1.3 ug/kg/min via INTRAVENOUS
  Filled 2015-11-26 (×16): qty 50

## 2015-11-26 MED ORDER — ARGATROBAN 50 MG/50ML IV SOLN
1.0000 ug/kg/min | INTRAVENOUS | Status: DC
  Filled 2015-11-26: qty 50

## 2015-11-26 MED ORDER — CHLORHEXIDINE GLUCONATE 4 % EX LIQD: 60.0000 mL | Freq: Once | CUTANEOUS | Status: DC

## 2015-11-26 MED ORDER — CEFAZOLIN SODIUM-DEXTROSE 2-4 GM/100ML-% IV SOLN: 2.0000 g | INTRAVENOUS | Status: DC

## 2015-11-26 MED ORDER — SODIUM CHLORIDE 0.9 % IR SOLN: 80.0000 mg | Status: DC

## 2015-11-26 MED ORDER — POTASSIUM CHLORIDE CRYS ER 20 MEQ PO TBCR
40.0000 meq | EXTENDED_RELEASE_TABLET | Freq: Once | ORAL | Status: AC
  Administered 2015-11-26: 40 meq via ORAL
  Filled 2015-11-26: qty 2

## 2015-11-26 MED ORDER — GADOBENATE DIMEGLUMINE 529 MG/ML IV SOLN
30.0000 mL | Freq: Once | INTRAVENOUS | Status: AC
  Administered 2015-11-26: 30 mL via INTRAVENOUS

## 2015-11-26 MED ORDER — CHLORHEXIDINE GLUCONATE 4 % EX LIQD
CUTANEOUS | Status: AC
  Filled 2015-11-26: qty 15

## 2015-11-26 MED ORDER — POTASSIUM CHLORIDE CRYS ER 20 MEQ PO TBCR
20.0000 meq | EXTENDED_RELEASE_TABLET | Freq: Two times a day (BID) | ORAL | Status: DC
  Administered 2015-11-26 – 2015-12-03 (×15): 20 meq via ORAL
  Filled 2015-11-26 (×15): qty 1

## 2015-11-26 NOTE — Progress Notes (Signed)
SUBJECTIVE: The patient is doing well today.  At this time, he denies chest pain, shortness of breath, or any new concerns.  CURRENT MEDICATIONS: . losartan  25 mg Oral Daily  . multivitamin with minerals  1 tablet Oral Daily  . pantoprazole  40 mg Oral Daily  . spironolactone  12.5 mg Oral Daily  . tobramycin  1 drop Left Eye Q6H   . sodium chloride 10 mL/hr at 11/22/15 2234    OBJECTIVE: Physical Exam: Vitals:   11/26/15 0412 11/26/15 0417 11/26/15 0444 11/26/15 0746  BP: 124/74 124/74  134/82  Pulse:    72  Resp: (!) 26 (!) 27  20  Temp: 98.9 F (37.2 C)   98 F (36.7 C)  TempSrc: Oral   Oral  SpO2: 92% 90%  91%  Weight:   214 lb 14.4 oz (97.5 kg)   Height:        Intake/Output Summary (Last 24 hours) at 11/26/15 0801 Last data filed at 11/25/15 2100  Gross per 24 hour  Intake           688.11 ml  Output              200 ml  Net           488.11 ml    Telemetry reveals sinus rhythm  GEN- The patient is well appearing, alert and oriented x 3 today.   Head- normocephalic, atraumatic Eyes-  Sclera clear, conjunctiva pink Ears- hearing intact Oropharynx- clear Neck- supple  Lungs- Clear to ausculation bilaterally, normal work of breathing Heart- Regular rate and rhythm  GI- soft, NT, ND, + BS Extremities- no clubbing, cyanosis, or edema Skin- no rash or lesion Psych- euthymic mood, full affect Neuro- strength and sensation are intact  LABS: Basic Metabolic Panel:  Recent Labs  28/78/67 0455 11/25/15 0400 11/26/15 0210  NA 146* 149* 147*  K 3.2* 3.2* 3.0*  CL 105 111 106  CO2 31 31 27   GLUCOSE 121* 110* 109*  BUN 49* 44* 39*  CREATININE 0.96 0.94 1.04  CALCIUM 9.2 9.2 8.7*  MG 2.7*  --  2.7*  PHOS  --   --  5.3*   CBC:  Recent Labs  11/25/15 0400 11/26/15 0210  WBC 13.0* 11.0*  HGB 12.3* 12.3*  HCT 39.0 39.1  MCV 101.3* 100.0  PLT 137* 117*        ASSESSMENT AND PLAN:  Principal Problem:   Cardiac arrest (HCC) Active  Problems:   Acute respiratory failure with hypoxemia (HCC)   Acute encephalopathy   Cardiogenic shock (HCC)   Anoxic brain injury (HCC)   Atrial fibrillation with rapid ventricular response (HCC)   Acute systolic CHF (congestive heart failure) (HCC)   Hypokalemia   NSVT (nonsustained ventricular tachycardia) (HCC)   VF (ventricular fibrillation) (HCC)   Heparin induced thrombocytopenia (HCC)   1.  VT/VF arrest The patient had a resuscitated VF arrest.  EMS strips demonstrate multiple episodes of VT/VF with a cycle length ~277msec. Catheterization with no ischemic cause. EF depressed on no home meds He meets criteria for ICD for secondary prevention. Discussed again with patient this morning Cardiac MRI this morning (no ventricular arrhythmias in 10 days since admission - ok to go off tele), tentatively plan ICD implant this afternoon (see below).  Keep K>3.8, Mg >1.8 No driving x6 months - pt and sons aware  2.  NICM Continue to optimize medical therapy Add BB when able  3.  Paroxysmal  atrial fibrillation Newly diagnosed this admission in the setting of cardiac arrest CHADS2VASC is 2 Would monitor for recurrence as patient clinically improves - would use MDT Visia AF device to look for AF  4.  HTN Stable No change required today  5.  Hypokalemia Repleted this morning   6.  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

## 2015-11-26 NOTE — Progress Notes (Signed)
Patient Name: Arthur Chase Date of Encounter: 11/26/2015  Hospital Problem List     Principal Problem:   Cardiac arrest [facility](HCC) Active Problems:   Acute encephalopathy   VF (ventricular fibrillation) (HCC)   Heparin induced thrombocytopenia (HCC)   Atrial fibrillation with rapid ventricular response (HCC)   NSVT (nonsustained ventricular tachycardia) (HCC)   Acute respiratory failure with hypoxemia (HCC)   Cardiogenic shock (HCC)   Anoxic brain injury (HCC)   Acute systolic CHF (congestive heart failure) (HCC)   Hypokalemia    Subjective   No chest pain or dyspnea.  Has been coughing some - usually after he tries to eat or drink.  Overall feels well. Frustrated about not being allowed to eat  Inpatient Medications    .  ceFAZolin (ANCEF) IV  2 g Intravenous To Cath  . chlorhexidine  60 mL Topical Once  . chlorhexidine  60 mL Topical Once  . chlorhexidine      . gentamicin irrigation  80 mg Irrigation On Call  . losartan  25 mg Oral Daily  . multivitamin with minerals  1 tablet Oral Daily  . pantoprazole  40 mg Oral Daily  . spironolactone  12.5 mg Oral Daily  . tobramycin  1 drop Left Eye Q6H    Vital Signs    Vitals:   11/26/15 0412 11/26/15 0417 11/26/15 0444 11/26/15 0746  BP: 124/74 124/74  134/82  Pulse:    72  Resp: (!) 26 (!) 27  20  Temp: 98.9 F (37.2 C)   98 F (36.7 C)  TempSrc: Oral   Oral  SpO2: 92% 90%  91%  Weight:   214 lb 14.4 oz (97.5 kg)   Height:        Intake/Output Summary (Last 24 hours) at 11/26/15 1040 Last data filed at 11/25/15 2100  Gross per 24 hour  Intake              360 ml  Output              200 ml  Net              160 ml   Filed Weights   11/24/15 0422 11/25/15 0500 11/26/15 0444  Weight: 226 lb 6.6 oz (102.7 kg) 219 lb 12.8 oz (99.7 kg) 214 lb 14.4 oz (97.5 kg)    Physical Exam    GEN: Well nourished, well developed, in no acute distress.  HEENT: normal.  Neck: Supple, no JVD, carotid bruits, or  masses. Cardiac: RRR, no murmurs, rubs, or gallops. No clubbing, cyanosis, edema.  Radials/DP/PT 2+ and equal bilaterally.  Respiratory:  Respirations regular and unlabored, clear to auscultation bilaterally. GI: Soft, nontender, nondistended, BS + x 4. MS: no deformity or atrophy. Skin: warm and dry, no rash. Neuro:  Strength and sensation are intact. Psych: Normal affect.  Labs    CBC  Recent Labs  11/25/15 0400 11/26/15 0210  WBC 13.0* 11.0*  HGB 12.3* 12.3*  HCT 39.0 39.1  MCV 101.3* 100.0  PLT 137* 117*   Basic Metabolic Panel  Recent Labs  11/24/15 0455 11/25/15 0400 11/26/15 0210  NA 146* 149* 147*  K 3.2* 3.2* 3.0*  CL 105 111 106  CO2 31 31 27   GLUCOSE 121* 110* 109*  BUN 49* 44* 39*  CREATININE 0.96 0.94 1.04  CALCIUM 9.2 9.2 8.7*  MG 2.7*  --  2.7*  PHOS  --   --  5.3*    Telemetry  Rsr, pvc's.  Cardiology     ECHO 8/31:  - Left ventricle: The cavity size was moderately dilated. There was   mild concentric hypertrophy. Systolic function was severely   reduced. The estimated ejection fraction was in the range of 25%   to 30%. Diffuse hypokinesis. The study is not technically   sufficient to allow evaluation of LV diastolic function. - Aortic valve: Trileaflet; normal thickness leaflets. There was no   regurgitation. - Aortic root: The aortic root was normal in size. - Mitral valve: Structurally normal valve. There was no   regurgitation. - Left atrium: The atrium was moderately dilated. - Right ventricle: The cavity size was mildly dilated. Wall   thickness was normal. Systolic function was moderately reduced. - Right atrium: Pacer wire or catheter noted in right atrium. - Tricuspid valve: There was mild regurgitation. - Pulmonary arteries: Systolic pressure was within the normal   range. PA peak pressure: 33 mm Hg (S). - Inferior vena cava: The vessel was normal in size. - Pericardium, extracardiac: There was no pericardial  effusion.  Impressions:  - Severe biventricular dysfunction. No significant change from the   prior exam.   CATH  The left ventricular systolic function is normal.  LV end diastolic pressure is normal.  The left ventricular ejection fraction is 55-65% by visual estimate.   Radiology    Dg Swallowing Func-speech Pathology  Result Date: 11/25/2015 Objective Swallowing Evaluation: Type of Study: MBS-Modified Barium Swallow Study Patient Details Name: Arthur Chase MRN: [id number] Date of Birth: 03/22/1956 Today's Date: 11/25/2015 Time: SLP Start Time (ACUTE ONLY): 1253-SLP Stop Time (ACUTE ONLY): 1318 SLP Time Calculation (min) (ACUTE ONLY): 25 min Past Medical History: Past Medical History: Diagnosis Date . Atrial fibrillation with rapid ventricular response (HCC) 11/20/2015 Past Surgical History: Past Surgical History: Procedure Laterality Date . CARDIAC CATHETERIZATION N/A 11/16/2015  Procedure: Left Heart Cath and Coronary Angiography;  Surgeon: Peter M Swaziland, MD;  Location: [facility] INVASIVE CV LAB;  Service: Cardiovascular;  Laterality: N/A; HPI: Pt is 60 y.o male with obesity and hypertension who was admitted due to cardiac arrest. Pt was intubated for 8 days and was extubated on 9/4. Subjective: Pt was alert and cooperative for swallow evaluation Assessment / Plan / Recommendation CHL IP CLINICAL IMPRESSIONS 11/25/2015 Therapy Diagnosis Mild pharyngeal phase dysphagia Clinical Impression Upon arrival, pt did cough up clear secretions before MBS began. Following the study, pt presents with mild pharyngeal dysphagia. Pt displayed a delay in swallow to the vallecula and reduced epiglottic closure when given thin liquids. While the pt did not aspirate during MBS, pt had silent penetration to the vocal cords during thin liquid and solid trials. It is unclear whether the penetration observed during the  solid trial was due to puree residue in the vallecula or the solid bolus. Pt required cues for throat  clearing to clear penetration. Recommend Dys 2 diet and thin liquid with likely diet improvement given time post extubation.  Impact on safety and function Mild aspiration risk   CHL IP TREATMENT RECOMMENDATION 11/25/2015 Treatment Recommendations Therapy as outlined in treatment plan below   Prognosis 11/25/2015 Prognosis for Safe Diet Advancement Good Barriers to Reach Goals Cognitive deficits Barriers/Prognosis Comment -- CHL IP DIET RECOMMENDATION 11/25/2015 SLP Diet Recommendations Dysphagia 2 (Fine chop) solids;Thin liquid Liquid Administration via Cup;Straw Medication Administration Whole meds with puree Compensations Slow rate;Small sips/bites;Minimize environmental distractions;Clear throat intermittently Postural Changes Remain semi-upright after after feeds/meals (Comment);Seated upright at 90 degrees   CHL IP  OTHER RECOMMENDATIONS 11/25/2015 Recommended Consults -- Oral Care Recommendations Oral care BID Other Recommendations Have oral suction available   CHL IP FOLLOW UP RECOMMENDATIONS 11/25/2015 Follow up Recommendations 24 hour supervision/assistance   CHL IP FREQUENCY AND DURATION 11/25/2015 Speech Therapy Frequency (ACUTE ONLY) min 2x/week Treatment Duration 2 weeks      CHL IP ORAL PHASE 11/25/2015 Oral Phase WFL Oral - Pudding Teaspoon -- Oral - Pudding Cup -- Oral - Honey Teaspoon -- Oral - Honey Cup -- Oral - Nectar Teaspoon -- Oral - Nectar Cup -- Oral - Nectar Straw -- Oral - Thin Teaspoon -- Oral - Thin Cup -- Oral - Thin Straw -- Oral - Puree -- Oral - Mech Soft -- Oral - Regular -- Oral - Multi-Consistency -- Oral - Pill -- Oral Phase - Comment --  CHL IP PHARYNGEAL PHASE 11/25/2015 Pharyngeal Phase Impaired Pharyngeal- Pudding Teaspoon -- Pharyngeal -- Pharyngeal- Pudding Cup -- Pharyngeal -- Pharyngeal- Honey Teaspoon -- Pharyngeal -- Pharyngeal- Honey Cup -- Pharyngeal -- Pharyngeal- Nectar Teaspoon -- Pharyngeal -- Pharyngeal- Nectar Cup -- Pharyngeal -- Pharyngeal- Nectar Straw -- Pharyngeal --  Pharyngeal- Thin Teaspoon -- Pharyngeal -- Pharyngeal- Thin Cup Penetration/Aspiration during swallow;Delayed swallow initiation-vallecula;Reduced epiglottic inversion Pharyngeal Material enters airway, CONTACTS cords and not ejected out Pharyngeal- Thin Straw Penetration/Aspiration during swallow;Delayed swallow initiation-vallecula;Reduced epiglottic inversion Pharyngeal Material enters airway, CONTACTS cords and not ejected out Pharyngeal- Puree Pharyngeal residue - valleculae;Penetration/Apiration after swallow Pharyngeal Material enters airway, CONTACTS cords and not ejected out Pharyngeal- Mechanical Soft -- Pharyngeal -- Pharyngeal- Regular Pharyngeal residue - valleculae;Penetration/Aspiration during swallow Pharyngeal Material enters airway, CONTACTS cords and not ejected out Pharyngeal- Multi-consistency -- Pharyngeal -- Pharyngeal- Pill WFL Pharyngeal -- Pharyngeal Comment --  CHL IP CERVICAL ESOPHAGEAL PHASE 11/25/2015 Cervical Esophageal Phase WFL Pudding Teaspoon -- Pudding Cup -- Honey Teaspoon -- Honey Cup -- Nectar Teaspoon -- Nectar Cup -- Nectar Straw -- Thin Teaspoon -- Thin Cup -- Thin Straw -- Puree -- Mechanical Soft -- Regular -- Multi-consistency -- Pill -- Cervical Esophageal Comment -- No flowsheet data found. Maxcine Ham 11/25/2015, 3:14 PM Note populated for Jearl Klinefelter, student SLP  Maxcine Ham, M.A. CCC-SLP [phone]             Mr Card Morphology Wo/w Cm  Result Date: 11/26/2015 CLINICAL DATA:  Cardiac Arrest EXAM: CARDIAC MRI TECHNIQUE: The patient was scanned on a 1.5 Tesla GE magnet. A dedicated cardiac coil was used. Functional imaging was done using Fiesta sequences. 2,3, and 4 chamber views were done to assess for RWMA's. Modified Simpson's rule using a short axis stack was used to calculate an ejection fraction on a dedicated work [employer]. The patient received 28 cc of Multihance. After 10 minutes inversion recovery sequences were used to  assess for infiltration and scar tissue. CONTRAST:  28 cc Multihance FINDINGS: All 4 cardiac chambers were normal in size and function. There was no ASD/VSD or pericardial effusion. The septum was 14 mm with no evidence of HOCM or SAM. The quantitative EF was 68% (EDV 130 cc ESV 41 cc SV 89 cc) Delayed enhancement showed no scar, infiltration or infarct. RV size and function were normal Dysplasia sequences not performed IMPRESSION: 1) Moderate LVH EF 68% no RWMA;s 2) No delayed gadolinium uptake No scar/infiltration of LV myocardium 3) Normal RV size and function Charlton Haws Electronically Signed   By: Charlton Haws M.D.   On: 11/26/2015 10:13    Patient Summary     60 y/o ?  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   This patients CHA2DS2-VASc Score and unadjusted Ischemic Stroke Rate (% per year) is equal to 2.2 % stroke rate/year from a score of 2  Above score calculated as 1 point each if present [CHF, HTN, DM, Vascular=MI/PAD/Aortic Plaque, Age if 33-74, or Male];  points each if present [Age > 75, or Stroke/TIA/TE]  Assessment & Plan    1.  Out of hospital cardiac arrest/VF/NSVT: Unclear etiology.  Nl cors on cath 8/27. S/P cooling. Extubated 9/4.  Neuro s/o. VT quiescent on amio - can plan to switch to po now that he is taking po's.  Seen by EP, plan for ICD prior to discharge. Being delayed to allow for anticoagulation in setting of HITT  2.  Acute resp failure/VDRF:  Extubated 9/4.  Per CCM. -- Now transferred to stepdown unit. PCCM is signed off. Transfer to cardiology service -- resolved  3.  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  Minus 9.4L this admission.  Wt down to 219 from peak of 270.  Creat stable.  No dyspnea.  Lungs clear.  CVP 3-5.  Lasix held yesterday.Marland Kitchen  He has been taking PO's.   Tolerating low dose valsartan with eventual  plan to transition to entresto 36 hrs after last enalapril (6am this morning) if bp stable.    Will also look to add low dose  blocker depending on how he does with oral ARB - consider starting tomorrow morning.  Duration of this condition is unknown because it is unlikely that he would have been 50 pounds up without having symptoms or more chronic indolent course.  4.  Anoxic Encephalopathy:  Mild confusion but overall alert and responsive.  Neuro has signed off.  5.  Hypokalemia: Supplementation ordered.  6.  Heparin Induced Thrombocytopenia:  Antibody highly positive state.  and platelet count has gone down from 137 down to 117. --> Will need to initiate our Argatroban infusion and monitor platelets. Once the plate count is above 150,000, we can stop. This will delay planned defibrillator for today until recheck  7.  PAF:  Maintaining sinus - now off of amiodarone. Continue to monitor. Not sure if this going to be a long-term issue or not, but would probably consider anticoagulation post ICD for at least the first few months in order to determine if there is any recurrence of A. fib.  On dysphagia 2 diet following swallow eval.  Appreciate Electrophysiology input. At this point I think we are delaying ICD until his platelet count has recovered. Then stay off of anticoagulation for 2 days with plans to start warfarin without bridging. Can follow rhythm on ICD evaluations to determine if there is any further recurrence of A. fib. If not, could probably stop as an outpatient.  He continues to progress well. I expect that if he remains stable he can probably be transferred out of the TCU stepdown to telemetry. This is all dependent upon of the floor can take someone on Argatroban drip.   Signed, Bryan Lemma, MD, MS

## 2015-11-26 NOTE — Progress Notes (Signed)
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  No Known Allergies  Patient Measurements: Height: 5\' 9"  (175.3 cm) Weight: 214 lb 14.4 oz (97.5 kg) IBW/kg (Calculated) : 70.7 Argatroban Dosing Weight: 97.5 kg  Vital Signs: Temp: 99.1 F (37.3 C) (09/06 2011) Temp Source: Oral (09/06 2011) BP: 127/77 (09/06 2011) Pulse Rate: 78 (09/06 1230)  Labs:  Recent Labs  11/24/15 0455 11/25/15 0400 11/26/15 0210  11/26/15 1138 11/26/15 1521 11/26/15 1929 11/26/15 2237  HGB 11.8* 12.3* 12.3*  --   --   --   --   --   HCT 37.2* 39.0 39.1  --   --   --   --   --   PLT 111* 137* 117*  --   --   --   --   --   APTT  --   --   --   < > 32 36 64* 67*  LABPROT  --   --   --   --  15.4*  --   --   --   INR  --   --   --   --  1.22  --   --   --   CREATININE 0.96 0.94 1.04  --   --   --   --   --   < > = values in this interval not displayed.  Estimated Creatinine Clearance: 87 mL/min (by C-G formula based on SCr of 1.04 mg/dL).  Assessment: 69 yoM admitted s/p VF arrest and CPR, previously on heparin for rule out ACS then rule out PE. Cath clean and CT showed no evidence of PE so heparin discontinued. Platelets declined to 87 on 9/3 and HIT antigen = 2.629 on 9/5. Platelets appeared to be trending back up so DTI therapy held in setting of planned ICD implantation, but platelets declined again on 9/6. Therefore, started on HIT protocol with argatroban and ICD plans postponed for now. Baseline aPTT 32, PT/INR 15.4/1.22, SRA in process.   aPTT 64 and repeat level 67 (both therapeutic) on Argatroban at 1.3 mcg/kg/min. No bleeding noted.  Goal of Therapy:  aPTT 50-90 seconds Monitor platelets by anticoagulation protocol: Yes   Plan:  Continue Argatroban at 1.3 mcg/kg/min  Daily aPTT, CBC Monitor for s/sx bleeding and thrombosis Follow up SRA - sent 11/25/15  Christoper Fabian, PharmD, BCPS Clinical pharmacist, pager  [phone] 11/26/2015,11:36 PM

## 2015-11-26 NOTE — Evaluation (Signed)
Physical Therapy Evaluation Patient Details Name: Arthur Chase MRN: [id number] DOB: 06-15-1955 Today's Date: 11/26/2015   History of Present Illness  60 y.o.malewith no significant past medical history. He doesn't remember any heart racing, shortness of breath, or chest pain preceding syncope. His childrenheard him breathing heavily from another room and found himunresponsive, started CPR, and on EMS arrival, he was in VF and received AED shock. Cardioverted in ED. Urgent cardiac catheterization with no obstructive CAD. He underwent cooling and has been rewarmed with neurologic recovery. Extubated 9/4. Awaiting ICD placement, however +HITT and waiting platelet recovery.    Clinical Impression  Pt admitted with above diagnosis. Patient with prolonged bedrest, ?anoxia, and decreased independence with mobility. Pt currently with functional limitations due to the deficits listed below (see PT Problem List).  Pt will benefit from skilled PT to increase their independence and safety with mobility to allow discharge to the venue listed below.       Follow Up Recommendations Home health PT;Supervision for mobility/OOB    Equipment Recommendations  None recommended by PT (will continue to assess)    Recommendations for Other Services OT consult     Precautions / Restrictions Precautions Precautions:  (awaiting ICD; vfib arrest)      Mobility  Bed Mobility Overal bed mobility: Modified Independent             General bed mobility comments: HOB flat with rail  Transfers Overall transfer level: Needs assistance Equipment used: None;Rolling walker (2 wheeled) Transfers: Sit to/from [employer] Sit to Stand: Min guard Stand pivot transfers: Min assist       General transfer comment: min assist due to imbalance with pivoting; educated on use of RW  Ambulation/Gait Ambulation/Gait assistance: Min Environmental consultant (Feet): 100 Feet Assistive device:  Rolling walker (2 wheeled) Gait Pattern/deviations: Step-through pattern;Decreased stride length   Gait velocity interpretation: Below normal speed for age/gender General Gait Details: slow, cautious; required assist to maneuver RW and cues for proper use  Stairs            Wheelchair Mobility    Modified Rankin (Stroke Patients Only)       Balance Overall balance assessment: Needs assistance Sitting-balance support: No upper extremity supported;Feet supported Sitting balance-Leahy Scale: Good     Standing balance support: No upper extremity supported Standing balance-Leahy Scale: Poor Standing balance comment: incr ant-post sway with feet shoulder width, eyes open                             Pertinent Vitals/Pain Pain Assessment: No/denies pain    Home Living Family/patient expects to be discharged to:: Private residence Living Arrangements: Children (2 sons, grandson (7, 62, 16 yo)) Available Help at Discharge: Family;Available 24 hours/day (son homeschools grandson) Type of Home: House Home Access: Stairs to enter Entrance Stairs-Rails: None Entrance Stairs-Number of Steps: 3 Home Layout: One level Home Equipment: Wheelchair - [employer] - single point (equipment from his dad)      Prior Function Level of Independence: Independent         Comments: works      Higher education careers adviser        Extremity/Trunk Assessment   Upper Extremity Assessment: Overall WFL for tasks assessed           Lower Extremity Assessment: Overall WFL for tasks assessed      Cervical / Trunk Assessment: Normal  Communication   Communication: No difficulties  Cognition  Arousal/Alertness: Awake/alert Behavior During Therapy: Flat affect Overall Cognitive Status: No family/caregiver present to determine baseline cognitive functioning (recalls many stories from army; 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)                      General Comments      Exercises         Assessment/Plan    PT Assessment Patient needs continued PT services  PT Diagnosis Difficulty walking   PT Problem List Decreased activity tolerance;Decreased balance;Decreased mobility;Decreased knowledge of use of DME;Decreased knowledge of precautions;Cardiopulmonary status limiting activity  PT Treatment Interventions DME instruction;Gait training;Stair training;Functional mobility training;Therapeutic activities;Balance training;Cognitive remediation;Patient/family education   PT Goals (Current goals can be found in the Care Plan section) Acute Rehab PT Goals Patient Stated Goal: regain independence PT Goal Formulation: With patient Time For Goal Achievement: 12/03/15 Potential to Achieve Goals: Good    Frequency Min 3X/week   Barriers to discharge        Co.p-evaluation               End of Session Equipment Utilized During Treatment: Gait belt;Oxygen Activity Tolerance: Patient tolerated treatment well Patient left: in chair;with call bell/phone within reach;with chair alarm set Nurse Communication: Mobility status         Time: [id number] PT Time Calculation (min) (ACUTE ONLY): 34 min   Charges:   PT Evaluation $PT Eval Low Complexity: 1 Procedure PT Treatments $Gait Training: 8-22 mins   PT G Codes:        Elycia Woodside 11/26/2015, 11:41 AM Pager [phone]

## 2015-11-26 NOTE — Progress Notes (Signed)
Speech Language Pathology Treatment: Dysphagia  Patient Details Name: Arthur Chase MRN: [id number] DOB: Jul 16, 1955 Today's Date: 11/26/2015 Time: 7106-2694 SLP Time Calculation (min) (ACUTE ONLY): 15 min  Assessment / Plan / Recommendation Clinical Impression  F/u after yesterday's MBS.  Pt alert, pleasant - questionable cognitive deficits.  Consuming thin liquids with min cues needed to reduce pace, intermittently clear throat, sit upright; however, overall with reduction of clinical signs of dysphagia.  Phonation improved with regard to quality and intensity.  Recommend continuing current diet with probable progression prior to D/C.  Will follow for safety/education.    HPI HPI: Pt is 60 y.o male with obesity and hypertension who was admitted due to cardiac arrest. Pt was intubated for 8 days and was extubated on 9/4.      SLP Plan  Continue with current plan of care     Recommendations  Diet recommendations: Dysphagia 2 (fine chop);Thin liquid Liquids provided via: Cup;Straw Medication Administration: Crushed with puree Supervision: Patient able to self feed Compensations: Slow rate;Small sips/bites;Minimize environmental distractions;Clear throat intermittently Postural Changes and/or Swallow Maneuvers: Seated upright 90 degrees             Oral Care Recommendations: Oral care BID Follow up Recommendations: 24 hour supervision/assistance Plan: Continue with current plan of care     GO               Arthur Chase, [place] CCC/SLP Pager [phone]  Arthur Chase 11/26/2015, 4:08 PM

## 2015-11-26 NOTE — Progress Notes (Signed)
ANTICOAGULATION CONSULT NOTE - Follow Up Consult  Pharmacy Consult for Argatroban Indication: Suspected HIT  No Known Allergies  Patient Measurements: Height: 5\' 9"  (175.3 cm) Weight: 214 lb 14.4 oz (97.5 kg) IBW/kg (Calculated) : 70.7 Heparin Dosing Weight: n/a  Vital Signs: Temp: 98.4 F (36.9 C) (09/06 1230) Temp Source: Oral (09/06 1230) BP: 128/85 (09/06 1230) Pulse Rate: 78 (09/06 1230)  Labs:  Recent Labs  11/24/15 0455 11/25/15 0400 11/26/15 0210 11/26/15 1138  HGB 11.8* 12.3* 12.3*  --   HCT 37.2* 39.0 39.1  --   PLT 111* 137* 117*  --   APTT  --   --   --  32  LABPROT  --   --   --  15.4*  INR  --   --   --  1.22  CREATININE 0.96 0.94 1.04  --     Estimated Creatinine Clearance: 87 mL/min (by C-G formula based on SCr of 1.04 mg/dL).    Assessment: 77 yoM admitted s/p VF arrest and CPR, previously on heparin for rule out ACS then rule out PE. Cath clean and CT showed no evidence of PE so heparin discontinued. Platelets declined to 87 on 9/3 and HIT antigen = 2.629 on 9/5. Platelets appeared to be trending back up so DTI therapy held in setting of planned ICD implantation, but platelets declined again on 9/6. Therefore, will begin HIT protocol with argatroban and postpone ICD plans for now. Baseline aPTT 32, PT/INR 15.4/1.22, SRA IP.  Goal of Therapy:  aPTT 50-90 seconds seconds Monitor platelets by anticoagulation protocol: Yes   Plan:  -Initiate Argatroban 1 mcg/kg/min, adjust to goal aPTT 50-90 sec. -Assess 2-hr post-start aPTT and then q2h aPTT until therapeutic x2. -Daily CBC, aPTT, S/Sx bleeding and thrombosis.   Fredonia Highland, PharmD PGY-1 Pharmacy Resident Pager: [phone]

## 2015-11-26 NOTE — Progress Notes (Signed)
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  No Known Allergies  Patient Measurements: Height: 5\' 9"  (175.3 cm) Weight: 214 lb 14.4 oz (97.5 kg) IBW/kg (Calculated) : 70.7 Argatroban Dosing Weight: 97.5 kg  Vital Signs: Temp: 98.4 F (36.9 C) (09/06 1230) Temp Source: Oral (09/06 1230) BP: 128/85 (09/06 1230) Pulse Rate: 78 (09/06 1230)  Labs:  Recent Labs  11/24/15 0455 11/25/15 0400 11/26/15 0210 11/26/15 1138 11/26/15 1521  HGB 11.8* 12.3* 12.3*  --   --   HCT 37.2* 39.0 39.1  --   --   PLT 111* 137* 117*  --   --   APTT  --   --   --  32 36  LABPROT  --   --   --  15.4*  --   INR  --   --   --  1.22  --   CREATININE 0.96 0.94 1.04  --   --     Estimated Creatinine Clearance: 87 mL/min (by C-G formula based on SCr of 1.04 mg/dL).  Assessment: 84 yoM admitted s/p VF arrest and CPR, previously on heparin for rule out ACS then rule out PE. Cath clean and CT showed no evidence of PE so heparin discontinued. Platelets declined to 87 on 9/3 and HIT antigen = 2.629 on 9/5. Platelets appeared to be trending back up so DTI therapy held in setting of planned ICD implantation, but platelets declined again on 9/6. Therefore, started on HIT protocol with argatroban and ICD plans postponed for now. Baseline aPTT 32, PT/INR 15.4/1.22, SRA in process.    Initial aPTT is only 36 seconds on Argatroban at 1 mcg/kg/min.  Goal of Therapy:  aPTT 50-90 seconds Monitor platelets by anticoagulation protocol: Yes   Plan:   Increase Argatroban to 1.3 mcg/kg/min (30 % increase)  aPTT in 2 hrs, then q2h until therapeutic x 2.  Daily aPTT, CBC.  Monitor for s/sx bleeding and thrombosis.  Follow up SRA - sent 11/25/15.  Dennie Fetters, [place] Pager: [phone] 11/26/2015,5:21 PM

## 2015-11-27 ENCOUNTER — Encounter (HOSPITAL_COMMUNITY): Payer: Self-pay

## 2015-11-27 LAB — CBC
HEMATOCRIT: 36.8 % — AB (ref 39.0–52.0)
HEMOGLOBIN: 11.5 g/dL — AB (ref 13.0–17.0)
MCH: 31.3 pg (ref 26.0–34.0)
MCHC: 31.3 g/dL (ref 30.0–36.0)
MCV: 100 fL (ref 78.0–100.0)
Platelets: 111 10*3/uL — ABNORMAL LOW (ref 150–400)
RBC: 3.68 MIL/uL — ABNORMAL LOW (ref 4.22–5.81)
RDW: 13.5 % (ref 11.5–15.5)
WBC: 9.4 10*3/uL (ref 4.0–10.5)

## 2015-11-27 LAB — BASIC METABOLIC PANEL
ANION GAP: 10 (ref 5–15)
BUN: 31 mg/dL — ABNORMAL HIGH (ref 6–20)
CALCIUM: 8.5 mg/dL — AB (ref 8.9–10.3)
CO2: 22 mmol/L (ref 22–32)
CREATININE: 0.87 mg/dL (ref 0.61–1.24)
Chloride: 110 mmol/L (ref 101–111)
Glucose, Bld: 102 mg/dL — ABNORMAL HIGH (ref 65–99)
Potassium: 3.8 mmol/L (ref 3.5–5.1)
SODIUM: 142 mmol/L (ref 135–145)

## 2015-11-27 LAB — SURGICAL PCR SCREEN
MRSA, PCR: POSITIVE — AB
STAPHYLOCOCCUS AUREUS: POSITIVE — AB

## 2015-11-27 LAB — HEMOGLOBIN A1C
HEMOGLOBIN A1C: 5.4 % (ref 4.8–5.6)
MEAN PLASMA GLUCOSE: 108 mg/dL

## 2015-11-27 LAB — APTT: APTT: 67 s — AB (ref 24–36)

## 2015-11-27 MED ORDER — METOPROLOL SUCCINATE ER 25 MG PO TB24
12.5000 mg | ORAL_TABLET | Freq: Every day | ORAL | Status: DC
  Administered 2015-11-27 – 2015-12-03 (×7): 12.5 mg via ORAL
  Filled 2015-11-27 (×8): qty 1

## 2015-11-27 MED ORDER — AMIODARONE HCL 200 MG PO TABS
400.0000 mg | ORAL_TABLET | Freq: Two times a day (BID) | ORAL | Status: AC
  Administered 2015-11-27 (×2): 400 mg via ORAL
  Filled 2015-11-27 (×2): qty 2

## 2015-11-27 MED ORDER — AMIODARONE HCL 200 MG PO TABS
400.0000 mg | ORAL_TABLET | Freq: Every day | ORAL | Status: DC
  Administered 2015-11-28 – 2015-12-03 (×6): 400 mg via ORAL
  Filled 2015-11-27 (×6): qty 2

## 2015-11-27 NOTE — Progress Notes (Signed)
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  No Known Allergies  Patient Measurements: Height: 5\' 9"  (175.3 cm) Weight: 219 lb 1.6 oz (99.4 kg) IBW/kg (Calculated) : 70.7 Argatroban Dosing Weight: 97.5 kg  Vital Signs: Temp: 98.7 F (37.1 C) (09/07 0349) Temp Source: Oral (09/07 0349) BP: 115/70 (09/07 0353)  Labs:  Recent Labs  11/25/15 0400 11/26/15 0210 11/26/15 1138  11/26/15 1929 11/26/15 2237 11/27/15 0229 11/27/15 0427  HGB 12.3* 12.3*  --   --   --   --  11.5*  --   HCT 39.0 39.1  --   --   --   --  36.8*  --   PLT 137* 117*  --   --   --   --  111*  --   APTT  --   --  32  < > 64* 67* 67*  --   LABPROT  --   --  15.4*  --   --   --   --   --   INR  --   --  1.22  --   --   --   --   --   CREATININE 0.94 1.04  --   --   --   --   --  0.87  < > = values in this interval not displayed.  Estimated Creatinine Clearance: 105 mL/min (by C-G formula based on SCr of 0.87 mg/dL).  Assessment: 42 yoM admitted s/p VF arrest and CPR, previously on heparin for rule out ACS then rule out PE. Cath clean and CT showed no evidence of PE so heparin discontinued. Platelets declined to 87 on 9/3 and HIT antigen = 2.629 on 9/5. Platelets appeared to be trending back up so DTI therapy held in setting of planned ICD implantation, but platelets declined again on 9/6. Therefore, started on HIT protocol with argatroban and ICD plans postponed for now. Baseline aPTT 32, PT/INR 15.4/1.22, SRA in process.   aPTT 67 and no S/Sx bleeding noted on Argatroban at 1.3 mcg/kg/min. Hgb 11.5, plt 111.  Goal of Therapy:  aPTT 50-90 seconds Monitor platelets by anticoagulation protocol: Yes   Plan:  -Continue Argatroban at 1.3 mcg/kg/min  -Daily aPTT, CBC -Monitor for s/sx bleeding and thrombosis -Follow up SRA - sent 11/25/15  Fredonia Highland, PharmD PGY-1 Pharmacy Resident Pager: [phone] 11/27/2015

## 2015-11-27 NOTE — Progress Notes (Signed)
Patient Name: Arthur Chase Date of Encounter: 11/27/2015  Hospital Problem List     Principal Problem:   Cardiac arrest [facility]) Active Problems:   Acute encephalopathy   VF (ventricular fibrillation) (HCC)   Heparin induced thrombocytopenia (HCC)   Atrial fibrillation with rapid ventricular response (HCC)   NSVT (nonsustained ventricular tachycardia) (HCC)   Acute respiratory failure with hypoxemia (HCC)   Cardiogenic shock (HCC)   Anoxic brain injury (HCC)   Acute systolic CHF (congestive heart failure) (HCC)   Hypokalemia    Subjective   No chest pain or dyspnea.  Feels good today.  Inpatient Medications    . chlorhexidine  60 mL Topical Once  . chlorhexidine  60 mL Topical Once  . losartan  25 mg Oral Daily  . multivitamin with minerals  1 tablet Oral Daily  . pantoprazole  40 mg Oral Daily  . potassium chloride  20 mEq Oral BID  . spironolactone  12.5 mg Oral Daily  . tobramycin  1 drop Left Eye Q6H    Vital Signs    Vitals:   11/27/15 0700 11/27/15 0800 11/27/15 0900 11/27/15 1000  BP:  120/78    Pulse:  77    Resp: 18 19 (!) 21 (!) 23  Temp:  98.1 F (36.7 C)    TempSrc:  Oral    SpO2: 96% 95% 95% 96%  Weight:      Height:        Intake/Output Summary (Last 24 hours) at 11/27/15 1035 Last data filed at 11/27/15 1000  Gross per 24 hour  Intake          1302.37 ml  Output              200 ml  Net          1102.37 ml   Filed Weights   11/25/15 0500 11/26/15 0444 11/27/15 0349  Weight: 219 lb 12.8 oz (99.7 kg) 214 lb 14.4 oz (97.5 kg) 219 lb 1.6 oz (99.4 kg)    Physical Exam    GEN: Well nourished, well developed, in no acute distress.  HEENT: normal.  Neck: Supple, no JVD, carotid bruits, or masses. Cardiac: RRR, no murmurs, rubs, or gallops. No clubbing, cyanosis, edema.  Radials/DP/PT 2+ and equal bilaterally.  Respiratory:  Respirations regular and unlabored, clear to auscultation bilaterally. GI: Soft, nontender, nondistended, BS + x  4. MS: no deformity or atrophy. Skin: warm and dry, no rash. Neuro:  Strength and sensation are intact. Psych: Normal affect.  Labs    CBC  Recent Labs  11/26/15 0210 11/27/15 0229  WBC 11.0* 9.4  HGB 12.3* 11.5*  HCT 39.1 36.8*  MCV 100.0 100.0  PLT 117* 111*   Basic Metabolic Panel  Recent Labs  11/26/15 0210 11/27/15 0427  NA 147* 142  K 3.0* 3.8  CL 106 110  CO2 27 22  GLUCOSE 109* 102*  BUN 39* 31*  CREATININE 1.04 0.87  CALCIUM 8.7* 8.5*  MG 2.7*  --   PHOS 5.3*  --     Telemetry    Rsr, pvc's.  Cardiology     ECHO 8/31:  - Left ventricle: The cavity size was moderately dilated. There was   mild concentric hypertrophy. Systolic function was severely   reduced. The estimated ejection fraction was in the range of 25%   to 30%. Diffuse hypokinesis. The study is not technically   sufficient to allow evaluation of LV diastolic function. - Aortic valve:  Trileaflet; normal thickness leaflets. There was no   regurgitation. - Aortic root: The aortic root was normal in size. - Mitral valve: Structurally normal valve. There was no   regurgitation. - Left atrium: The atrium was moderately dilated. - Right ventricle: The cavity size was mildly dilated. Wall   thickness was normal. Systolic function was moderately reduced. - Right atrium: Pacer wire or catheter noted in right atrium. - Tricuspid valve: There was mild regurgitation. - Pulmonary arteries: Systolic pressure was within the normal   range. PA peak pressure: 33 mm Hg (S). - Inferior vena cava: The vessel was normal in size. - Pericardium, extracardiac: There was no pericardial effusion.  Impressions:  - Severe biventricular dysfunction. No significant change from the   prior exam.   CATH  The left ventricular systolic function is normal.  LV end diastolic pressure is normal.  The left ventricular ejection fraction is 55-65% by visual estimate.   Radiology    Dg Swallowing  Func-speech Pathology  Result Date: 11/25/2015 Objective Swallowing Evaluation: Type of Study: MBS-Modified Barium Swallow Study Patient Details Name: Arthur Chase MRN: [id number] Date of Birth: 05/13/1955 Today's Date: 11/25/2015 Time: SLP Start Time (ACUTE ONLY): 1253-SLP Stop Time (ACUTE ONLY): 1318 SLP Time Calculation (min) (ACUTE ONLY): 25 min Past Medical History: Past Medical History: Diagnosis Date . Atrial fibrillation with rapid ventricular response (HCC) 11/20/2015 Past Surgical History: Past Surgical History: Procedure Laterality Date . CARDIAC CATHETERIZATION N/A 11/16/2015  Procedure: Left Heart Cath and Coronary Angiography;  Surgeon: Peter M SwazilandJordan, MD;  [facility];  Service: Cardiovascular;  Laterality: N/A; HPI: Pt is 60 y.o male with obesity and hypertension who was admitted due to cardiac arrest. Pt was intubated for 8 days and was extubated on 9/4. Subjective: Pt was alert and cooperative for swallow evaluation Assessment / Plan / Recommendation CHL IP CLINICAL IMPRESSIONS 11/25/2015 Therapy Diagnosis Mild pharyngeal phase dysphagia Clinical Impression Upon arrival, pt did cough up clear secretions before MBS began. Following the study, pt presents with mild pharyngeal dysphagia. Pt displayed a delay in swallow to the vallecula and reduced epiglottic closure when given thin liquids. While the pt did not aspirate during MBS, pt had silent penetration to the vocal cords during thin liquid and solid trials. It is unclear whether the penetration observed during the  solid trial was due to puree residue in the vallecula or the solid bolus. Pt required cues for throat clearing to clear penetration. Recommend Dys 2 diet and thin liquid with likely diet improvement given time post extubation.  Impact on safety and function Mild aspiration risk   CHL IP TREATMENT RECOMMENDATION 11/25/2015 Treatment Recommendations Therapy as outlined in treatment plan below   Prognosis 11/25/2015 Prognosis for  Safe Diet Advancement Good Barriers to Reach Goals Cognitive deficits Barriers/Prognosis Comment -- CHL IP DIET RECOMMENDATION 11/25/2015 SLP Diet Recommendations Dysphagia 2 (Fine chop) solids;Thin liquid Liquid Administration via Cup;Straw Medication Administration Whole meds with puree Compensations Slow rate;Small sips/bites;Minimize environmental distractions;Clear throat intermittently Postural Changes Remain semi-upright after after feeds/meals (Comment);Seated upright at 90 degrees   CHL IP OTHER RECOMMENDATIONS 11/25/2015 Recommended Consults -- Oral Care Recommendations Oral care BID Other Recommendations Have oral suction available   CHL IP FOLLOW UP RECOMMENDATIONS 11/25/2015 Follow up Recommendations 24 hour supervision/assistance   CHL IP FREQUENCY AND DURATION 11/25/2015 Speech Therapy Frequency (ACUTE ONLY) min 2x/week Treatment Duration 2 weeks      CHL IP ORAL PHASE 11/25/2015 Oral Phase WFL Oral - Pudding Teaspoon --  Oral - Pudding Cup -- Oral - Honey Teaspoon -- Oral - Honey Cup -- Oral - Nectar Teaspoon -- Oral - Nectar Cup -- Oral - Nectar Straw -- Oral - Thin Teaspoon -- Oral - Thin Cup -- Oral - Thin Straw -- Oral - Puree -- Oral - Mech Soft -- Oral - Regular -- Oral - Multi-Consistency -- Oral - Pill -- Oral Phase - Comment --  CHL IP PHARYNGEAL PHASE 11/25/2015 Pharyngeal Phase Impaired Pharyngeal- Pudding Teaspoon -- Pharyngeal -- Pharyngeal- Pudding Cup -- Pharyngeal -- Pharyngeal- Honey Teaspoon -- Pharyngeal -- Pharyngeal- Honey Cup -- Pharyngeal -- Pharyngeal- Nectar Teaspoon -- Pharyngeal -- Pharyngeal- Nectar Cup -- Pharyngeal -- Pharyngeal- Nectar Straw -- Pharyngeal -- Pharyngeal- Thin Teaspoon -- Pharyngeal -- Pharyngeal- Thin Cup Penetration/Aspiration during swallow;Delayed swallow initiation-vallecula;Reduced epiglottic inversion Pharyngeal Material enters airway, CONTACTS cords and not ejected out Pharyngeal- Thin Straw Penetration/Aspiration during swallow;Delayed swallow  initiation-vallecula;Reduced epiglottic inversion Pharyngeal Material enters airway, CONTACTS cords and not ejected out Pharyngeal- Puree Pharyngeal residue - valleculae;Penetration/Apiration after swallow Pharyngeal Material enters airway, CONTACTS cords and not ejected out Pharyngeal- Mechanical Soft -- Pharyngeal -- Pharyngeal- Regular Pharyngeal residue - valleculae;Penetration/Aspiration during swallow Pharyngeal Material enters airway, CONTACTS cords and not ejected out Pharyngeal- Multi-consistency -- Pharyngeal -- Pharyngeal- Pill WFL Pharyngeal -- Pharyngeal Comment --  CHL IP CERVICAL ESOPHAGEAL PHASE 11/25/2015 Cervical Esophageal Phase WFL Pudding Teaspoon -- Pudding Cup -- Honey Teaspoon -- Honey Cup -- Nectar Teaspoon -- Nectar Cup -- Nectar Straw -- Thin Teaspoon -- Thin Cup -- Thin Straw -- Puree -- Mechanical Soft -- Regular -- Multi-consistency -- Pill -- Cervical Esophageal Comment -- No flowsheet data found. Maxcine Ham 11/25/2015, 3:14 PM Note populated for Jearl Klinefelter, student SLP  Maxcine Ham, M.A. CCC-SLP [phone]             Mr Card Morphology Wo/w Cm  Result Date: 11/26/2015 CLINICAL DATA:  Cardiac Arrest EXAM: CARDIAC MRI TECHNIQUE: The patient was scanned on a 1.5 Tesla GE magnet. A dedicated cardiac coil was used. Functional imaging was done using Fiesta sequences. 2,3, and 4 chamber views were done to assess for RWMA's. Modified Simpson's rule using a short axis stack was used to calculate an ejection fraction on a dedicated work [employer]. The patient received 28 cc of Multihance. After 10 minutes inversion recovery sequences were used to assess for infiltration and scar tissue. CONTRAST:  28 cc Multihance FINDINGS: All 4 cardiac chambers were normal in size and function. There was no ASD/VSD or pericardial effusion. The septum was 14 mm with no evidence of HOCM or SAM. The quantitative EF was 68% (EDV 130 cc ESV 41 cc SV 89 cc) Delayed enhancement  showed no scar, infiltration or infarct. RV size and function were normal Dysplasia sequences not performed IMPRESSION: 1) Moderate LVH EF 68% no RWMA;s 2) No delayed gadolinium uptake No scar/infiltration of LV myocardium 3) Normal RV size and function Charlton Haws Electronically Signed   By: Charlton Haws M.D.   On: 11/26/2015 10:13    Patient Summary     60 y/o ? 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   This patients CHA2DS2-VASc Score and unadjusted Ischemic Stroke Rate (% per year) is equal to 2.2 % stroke rate/year from a score of 2  Above score calculated as 1 point each if present [CHF, HTN, DM,  Vascular=MI/PAD/Aortic Plaque, Age if 23-74, or Male];  points each if present [Age > 75, or Stroke/TIA/TE] EF now ~68% by CMR  Assessment & Plan    1.  Out of hospital cardiac arrest/VF/NSVT: Unclear etiology.  Nl cors on cath 8/27. S/P cooling. Extubated 9/4.  Neuro s/o. VT quiescent on amio - can plan to switch to po now that he is taking po's.  Seen by EP, plan for ICD prior to discharge. Being delayed to allow for anticoagulation in setting of HITT  EF now ~68% by CMR   Continue Amiodarone PO (was stopped by EP given plan for ICD - but with that on hold, will restart)  2.  Acute resp failure/VDRF:  Extubated 9/4.  Per CCM. -- Now transferred to stepdown unit. PCCM is signed off. Transfer to cardiology service -- resolved  3.  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  Minus 9.4L this admission.  Wt down to 219 from peak of 270.  Creat stable.  No dyspnea.  Lungs clear.  CVP 3-5.  Lasix held yesterday.Marland Kitchen  He has been taking PO's. -- EF now >65% by CMR - suspect some of the reduced EF was simply post VF related stunning.  Tolerating low dose valsartan - with EF now "normal", will not convert to [facility].  Will add low dose  blocker depending  on how he does with oral ARB - consider starting tomorrow morning.   Duration of this condition is unknown because it is unlikely that he would have been 50 pounds up without having symptoms or more chronic indolent course.  4.  Anoxic Encephalopathy:  Mild confusion but overall alert and responsive.  Neuro has signed off.  5.  Hypokalemia: Supplementation ordered.  6.  Heparin Induced Thrombocytopenia:  Antibody highly positive state.  and platelet count has gone down from 137 down to 117 & now 111. --> Will need to initiate our Argatroban infusion and monitor platelets. Once the plate count is above 150,000, we can stop. This will delay planned defibrillator for today until recheck  7.  PAF:  Maintaining sinus - now restarting amiodarone. Continue to monitor. Not sure if this going to be a long-term issue or not, but would probably consider anticoagulation post ICD for at least the first few months in order to determine if there is any recurrence of A. Fib.  On dysphagia 2 diet following swallow eval.  Appreciate Electrophysiology input. At this point I think we are delaying ICD until his platelet count has recovered. Then stay off of anticoagulation for 2 days with plans to start warfarin without bridging. Can follow rhythm on ICD evaluations to determine if there is any further recurrence of A. fib. If not, could probably stop as an outpatient.  Transfer to Tele today   Signed, Bryan Lemma, MD, MS

## 2015-11-27 NOTE — Progress Notes (Signed)
11/27/2015 1245 Received pt to room 2W11.  Pt is A&O, without complaints.  Tele monitor applied and CCMD notified.  Oriented to room, call light and bed.  Call bell in reach. Kathryne Hitch

## 2015-11-27 NOTE — Progress Notes (Signed)
Platelet count noted. Will hold off on ICD implant today. Recheck CBC tomorrow.  Likely ICD implant early next week.  Gypsy Balsam, NP 11/27/2015 10:57 AM  Jarold Song

## 2015-11-28 ENCOUNTER — Encounter (HOSPITAL_COMMUNITY): Payer: Self-pay

## 2015-11-28 LAB — SEROTONIN RELEASE ASSAY (SRA)
SRA .2 IU/mL UFH Ser-aCnc: 72 % — ABNORMAL HIGH (ref 0–20)
SRA 100IU/mL UFH Ser-aCnc: <1 % (ref 0–20)

## 2015-11-28 LAB — CBC
HEMATOCRIT: 36.4 % — AB (ref 39.0–52.0)
Hemoglobin: 11.6 g/dL — ABNORMAL LOW (ref 13.0–17.0)
MCH: 31.6 pg (ref 26.0–34.0)
MCHC: 31.9 g/dL (ref 30.0–36.0)
MCV: 99.2 fL (ref 78.0–100.0)
Platelets: 101 10*3/uL — ABNORMAL LOW (ref 150–400)
RBC: 3.67 MIL/uL — AB (ref 4.22–5.81)
RDW: 13.1 % (ref 11.5–15.5)
WBC: 9.3 10*3/uL (ref 4.0–10.5)

## 2015-11-28 LAB — MAGNESIUM: Magnesium: 2.8 mg/dL — ABNORMAL HIGH (ref 1.7–2.4)

## 2015-11-28 LAB — APTT: aPTT: 65 seconds — ABNORMAL HIGH (ref 24–36)

## 2015-11-28 MED ORDER — CHLORHEXIDINE GLUCONATE CLOTH 2 % EX PADS
6.0000 | MEDICATED_PAD | Freq: Every day | CUTANEOUS | Status: AC
  Administered 2015-11-28 – 2015-12-02 (×5): 6 via TOPICAL

## 2015-11-28 MED ORDER — MUPIROCIN 2 % EX OINT
1.0000 "application " | TOPICAL_OINTMENT | Freq: Two times a day (BID) | CUTANEOUS | Status: AC
  Administered 2015-11-28 – 2015-12-02 (×10): 1 via NASAL
  Filled 2015-11-28 (×3): qty 22

## 2015-11-28 NOTE — Progress Notes (Signed)
PT Cancellation Note  Patient Details Name: Arthur Chase MRN: [id number] DOB: Nov 29, 1955   Cancelled Treatment:    Reason Eval/Treat Not Completed: Other (comment) (Pt was eating a meal and in BR on two occasions).  Was up walking mod I in room when headed to BR earlier.  Will try later if time allows.   Ivar Drape 11/28/2015, 2:02 PM    Samul Dada, PT MS Acute Rehab Dept. Number: [facility] [id number] and [facility] [phone]

## 2015-11-28 NOTE — Progress Notes (Signed)
Patient Name: Arthur Chase Date of Encounter: 11/28/2015  Hospital Problem List     Principal Problem:   Cardiac arrest [facility](HCC) Active Problems:   Acute encephalopathy   VF (ventricular fibrillation) (HCC)   Heparin induced thrombocytopenia (HCC)   Atrial fibrillation with rapid ventricular response (HCC)   NSVT (nonsustained ventricular tachycardia) (HCC)   Acute respiratory failure with hypoxemia (HCC)   Cardiogenic shock (HCC)   Anoxic brain injury (HCC)   Acute systolic CHF (congestive heart failure) (HCC)   Hypokalemia    Subjective   No chest pain or dyspnea.  Feels good today.Was able to walk without difficulty.  Inpatient Medications    . amiodarone  400 mg Oral Daily  . losartan  25 mg Oral Daily  . metoprolol succinate  12.5 mg Oral Daily  . multivitamin with minerals  1 tablet Oral Daily  . pantoprazole  40 mg Oral Daily  . potassium chloride  20 mEq Oral BID  . spironolactone  12.5 mg Oral Daily  . tobramycin  1 drop Left Eye Q6H    Vital Signs    Vitals:   11/27/15 2025 11/28/15 0459 11/28/15 1020 11/28/15 1225  BP: 133/76 (!) 117/55 115/70 126/69  Pulse: 67 66 78 71  Resp: 18 18  19   Temp: 98.5 F (36.9 C) 98.6 F (37 C)  98.8 F (37.1 C)  TempSrc: Oral Oral  Oral  SpO2: 95% 97%  94%  Weight:  218 lb 6.4 oz (99.1 kg)    Height:        Intake/Output Summary (Last 24 hours) at 11/28/15 1235 Last data filed at 11/28/15 1226  Gross per 24 hour  Intake              720 ml  Output              650 ml  Net               70 ml   Filed Weights   11/26/15 0444 11/27/15 0349 11/28/15 0459  Weight: 214 lb 14.4 oz (97.5 kg) 219 lb 1.6 oz (99.4 kg) 218 lb 6.4 oz (99.1 kg)    Physical Exam    GEN: Well nourished, well developed, in no acute distress.  HEENT: normal.  Neck: Supple, no JVD, carotid bruits, or masses. Cardiac: RRR, no murmurs, rubs, or gallops. No clubbing, cyanosis, edema.  Radials/DP/PT 2+ and equal bilaterally.  Respiratory:   Respirations regular and unlabored, clear to auscultation bilaterally. GI: Soft, nontender, nondistended, BS + x 4. MS: no deformity or atrophy. Skin: warm and dry, no rash. Neuro:  Strength and sensation are intact. Psych: Normal affect.  Labs    CBC  Recent Labs  11/27/15 0229 11/28/15 0228  WBC 9.4 9.3  HGB 11.5* 11.6*  HCT 36.8* 36.4*  MCV 100.0 99.2  PLT 111* 101*   Basic Metabolic Panel  Recent Labs  11/26/15 0210 11/27/15 0427 11/28/15 0228  NA 147* 142  --   K 3.0* 3.8  --   CL 106 110  --   CO2 27 22  --   GLUCOSE 109* 102*  --   BUN 39* 31*  --   CREATININE 1.04 0.87  --   CALCIUM 8.7* 8.5*  --   MG 2.7*  --  2.8*  PHOS 5.3*  --   --     Telemetry    Rsr, pvc's.  Cardiology     ECHO 8/31:  - Left  ventricle: The cavity size was moderately dilated. There was   mild concentric hypertrophy. Systolic function was severely   reduced. The estimated ejection fraction was in the range of 25%   to 30%. Diffuse hypokinesis. The study is not technically   sufficient to allow evaluation of LV diastolic function. - Aortic valve: Trileaflet; normal thickness leaflets. There was no   regurgitation. - Aortic root: The aortic root was normal in size. - Mitral valve: Structurally normal valve. There was no   regurgitation. - Left atrium: The atrium was moderately dilated. - Right ventricle: The cavity size was mildly dilated. Wall   thickness was normal. Systolic function was moderately reduced. - Right atrium: Pacer wire or catheter noted in right atrium. - Tricuspid valve: There was mild regurgitation. - Pulmonary arteries: Systolic pressure was within the normal   range. PA peak pressure: 33 mm Hg (S). - Inferior vena cava: The vessel was normal in size. - Pericardium, extracardiac: There was no pericardial effusion.  Impressions:  - Severe biventricular dysfunction. No significant change from the   prior exam.   CATH  The left ventricular  systolic function is normal.  LV end diastolic pressure is normal.  The left ventricular ejection fraction is 55-65% by visual estimate.   Radiology    Mr Card Morphology Wo/w Cm  Result Date: 11/26/2015 CLINICAL DATA:  Cardiac Arrest EXAM: CARDIAC MRI TECHNIQUE: The patient was scanned on a 1.5 Tesla GE magnet. A dedicated cardiac coil was used. Functional imaging was done using Fiesta sequences. 2,3, and 4 chamber views were done to assess for RWMA's. Modified Simpson's rule using a short axis stack was used to calculate an ejection fraction on a dedicated work [employer]. The patient received 28 cc of Multihance. After 10 minutes inversion recovery sequences were used to assess for infiltration and scar tissue. CONTRAST:  28 cc Multihance FINDINGS: All 4 cardiac chambers were normal in size and function. There was no ASD/VSD or pericardial effusion. The septum was 14 mm with no evidence of HOCM or SAM. The quantitative EF was 68% (EDV 130 cc ESV 41 cc SV 89 cc) Delayed enhancement showed no scar, infiltration or infarct. RV size and function were normal Dysplasia sequences not performed IMPRESSION: 1) Moderate LVH EF 68% no RWMA;s 2) No delayed gadolinium uptake No scar/infiltration of LV myocardium 3) Normal RV size and function Charlton Haws Electronically Signed   By: Charlton Haws M.D.   On: 11/26/2015 10:13    Patient Summary     60 y/o ? 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   This patients CHA2DS2-VASc Score and unadjusted Ischemic Stroke Rate (% per year) is equal to 2.2 % stroke rate/year from a score of 2  Above score calculated as 1 point each if present [CHF, HTN, DM, Vascular=MI/PAD/Aortic Plaque, Age if 58-74, or Male];  points each if present [Age > 75, or Stroke/TIA/TE] EF now ~68% by CMR  Assessment & Plan      1.  Out of hospital cardiac arrest/VF/NSVT: Unclear etiology.  Nl cors on cath 8/27. S/P cooling. Extubated 9/4.  Neuro s/o. VT quiescent on amio - can plan to switch to po now that he is taking po's.  Seen by EP, plan for ICD prior to discharge. Being delayed to allow for anticoagulation in setting of HITT  EF now ~  68% by CMR   Restarted amiodarone by mouth. No further arrhythmias.  2.  Acute resp failure/VDRF:  Extubated 9/4.  Per CCM. -- Now transferred to stepdown unit. PCCM is signed off. Transfer to cardiology service -- resolved  3.  Acute systolic CHF/NICM: EF 25-30% by initial echo, now 65-70% by CMRI.  Minus 9.4L this admission.  Wt down to 219 from peak of 270.  Creat stable.  No dyspnea.  Lungs clear.  CVP 3-5.  Lasix held yesterday.Marland Kitchen  He has been taking PO's. -- EF now >65% by CMR - suspect some of the reduced EF was simply post VF related stunning.  Tolerating low dose valsartan - with EF now "normal", will not convert to [facility].  Started low-dose Toprol this morning.  Duration of this condition is unknown because it is unlikely that he would have been 50 pounds up without having symptoms or more chronic indolent course. -- Interesting that now he has had near return of cardiac function.  4.  Anoxic Encephalopathy:  Mild confusion but overall alert and responsive.  Neuro has signed off.  5.  Hypokalemia: Supplementation ordered. Stable  6.  Heparin Induced Thrombocytopenia:  Antibody highly positive state.  and platelet count has gone down from 137 down to 101. -->   On Argatroban infusion and monitor platelets. Once the platet count is above 150,000, we can stop. This will delay planned defibrillator for today until recheck  Once ICD done - can initiate Warfarin  7.  PAF:  Maintaining sinus - now restarting amiodarone. Continue to monitor. Not sure if this going to be a long-term issue or not, but would probably consider anticoagulation post ICD for at least the  first few months in order to determine if there is any recurrence of A. Fib.  On dysphagia 2 diet following swallow eval.  Appreciate Electrophysiology input. At this point I think we are delaying ICD until his platelet count has recovered. Then stay off of anticoagulation for 2 days with plans to start warfarin without bridging. Can follow rhythm on ICD evaluations to determine if there is any further recurrence of A. fib. If not, could probably stop as an outpatient.  Unfortunately - will need to continue monitoring over the weekend until Platelet counts return.   Signed, Bryan Lemma, MD, MS

## 2015-11-28 NOTE — Progress Notes (Signed)
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 Heparin     Thrombocytopenia. 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    Patient Measurements: Height: 5\' 9"  (175.3 cm) Weight: 218 lb 6.4 oz (99.1 kg) IBW/kg (Calculated) : 70.7 Argatroban Dosing Weight: 97.5 kg  Vital Signs: Temp: 98.8 F (37.1 C) (09/08 1225) Temp Source: Oral (09/08 1225) BP: 126/69 (09/08 1225) Pulse Rate: 71 (09/08 1225)  Labs:  Recent Labs  11/26/15 0210 11/26/15 1138  11/26/15 2237 11/27/15 0229 11/27/15 0427 11/28/15 0228  HGB 12.3*  --   --   --  11.5*  --  11.6*  HCT 39.1  --   --   --  36.8*  --  36.4*  PLT 117*  --   --   --  111*  --  101*  APTT  --  32  < > 67* 67*  --  65*  LABPROT  --  15.4*  --   --   --   --   --   INR  --  1.22  --   --   --   --   --   CREATININE 1.04  --   --   --   --  0.87  --   < > = values in this interval not displayed.  Estimated Creatinine Clearance: 104.9 mL/min (by C-G formula based on SCr of 0.87 mg/dL).  Assessment: 32 yoM admitted s/p VF arrest and CPR, previously on heparin for rule out ACS then rule out PE. Cath clean and CT showed no evidence of PE so heparin discontinued. Platelets declined to 87 on 9/3 and HIT antigen = 2.629 on 9/5. Platelets appeared to be trending back up so DTI therapy held in setting of planned ICD implantation, but platelets declined again on 9/6. Therefore, started on HIT protocol with argatroban and ICD plans postponed for now. Baseline aPTT 32, PT/INR 15.4/1.22.   aPTT 65 seconds on Argatroban at 1.3 mcg/kg/hr. Hgb 11.6, platelet count 111.  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  Planning Coumadin post-ICD, which has been postponed until platelet count >150K.   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  Goal of Therapy:  aPTT 50-90 seconds Monitor platelets by anticoagulation protocol: Yes   Plan:   Continue Argatroban at 1.3 mcg/kg/min   Daily aPTT and CBC  Monitor for s/sx bleeding and thrombosis.  CHG/bactroban x 5 days added for postitive MRSA PCR.  Dennie Fetters, [place] Pager: [phone] 11/28/2015

## 2015-11-29 ENCOUNTER — Encounter (HOSPITAL_COMMUNITY): Payer: Self-pay

## 2015-11-29 LAB — CBC
HEMATOCRIT: 34.4 % — AB (ref 39.0–52.0)
HEMOGLOBIN: 10.8 g/dL — AB (ref 13.0–17.0)
MCH: 31.1 pg (ref 26.0–34.0)
MCHC: 31.4 g/dL (ref 30.0–36.0)
MCV: 99.1 fL (ref 78.0–100.0)
Platelets: 123 10*3/uL — ABNORMAL LOW (ref 150–400)
RBC: 3.47 MIL/uL — ABNORMAL LOW (ref 4.22–5.81)
RDW: 12.9 % (ref 11.5–15.5)
WBC: 8.6 10*3/uL (ref 4.0–10.5)

## 2015-11-29 LAB — APTT: aPTT: 55 seconds — ABNORMAL HIGH (ref 24–36)

## 2015-11-29 NOTE — Progress Notes (Signed)
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 Heparin     Thrombocytopenia. 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    Patient Measurements: Height: 5\' 10"  (177.8 cm) Weight: 220 lb 8 oz (100 kg) IBW/kg (Calculated) : 73 Argatroban Dosing Weight: 97.5 kg  Vital Signs: Temp: 98.7 F (37.1 C) (09/09 0514) Temp Source: Oral (09/09 0514) BP: 129/73 (09/09 0952) Pulse Rate: 73 (09/09 0952)  Labs:  Recent Labs  11/26/15 1138  11/27/15 0229 11/27/15 0427 11/28/15 0228 11/29/15 0352  HGB  --   < > 11.5*  --  11.6* 10.8*  HCT  --   --  36.8*  --  36.4* 34.4*  PLT  --   --  111*  --  101* 123*  APTT 32  < > 67*  --  65* 55*  LABPROT 15.4*  --   --   --   --   --   INR 1.22  --   --   --   --   --   CREATININE  --   --   --  0.87  --   --   < > = values in this interval not displayed.  Estimated Creatinine Clearance: 107 mL/min (by C-G formula based on SCr of 0.87 mg/dL).  Assessment: 3 yoM admitted s/p VF arrest and CPR, previously on heparin for rule out ACS then rule out PE. Cath clean and CT showed no evidence of PE so heparin discontinued. Platelets declined to 87 on 9/3 and HIT antigen = 2.629 on 9/5. Platelets appeared to be trending back up so DTI therapy held in setting of planned ICD implantation, but platelets declined again on 9/6. Therefore, started on HIT protocol with argatroban and ICD plans postponed for now. Baseline aPTT 32, PT/INR 15.4/1.22. 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    aPTT 55 seconds (therapeutic) on Argatroban at 1.3 mcg/kg/hr. Hgb 10.8, platelet count 123.  Planning Coumadin post-ICD, which has been postponed until platelet count >150K.   Goal of Therapy:  aPTT 50-90 seconds Monitor platelets by anticoagulation protocol: Yes    Plan:  1. Continue Argatroban at 1.3 mcg/kg/min  2. Daily aPTT and CBC 3. Monitor for s/sx bleeding and  thrombosis 4. Follow up ICD placement and bridging of argatroban to warfarin post procedure      Pollyann Samples, PharmD, BCPS 11/29/2015, 10:53 AM Pager: [phone]

## 2015-11-29 NOTE — Progress Notes (Signed)
Patient ID: CHAUN UEMURA, male   DOB: April 01, 1955, 60 y.o.   MRN: [id number]    Patient Name: Arthur Chase Date of Encounter: 11/29/2015     Principal Problem:   Cardiac arrest [facility]) Active Problems:   Acute respiratory failure with hypoxemia (HCC)   Acute encephalopathy   Cardiogenic shock (HCC)   Anoxic brain injury (HCC)   Atrial fibrillation with rapid ventricular response (HCC)   Acute systolic CHF (congestive heart failure) (HCC)   Hypokalemia   NSVT (nonsustained ventricular tachycardia) (HCC)   VF (ventricular fibrillation) (HCC)   Heparin induced thrombocytopenia (HCC)    SUBJECTIVE  No chest pain or sob. No palpitations.  CURRENT MEDS . amiodarone  400 mg Oral Daily  . Chlorhexidine Gluconate Cloth  6 each Topical Q0600  . losartan  25 mg Oral Daily  . metoprolol succinate  12.5 mg Oral Daily  . multivitamin with minerals  1 tablet Oral Daily  . mupirocin ointment  1 application Nasal BID  . pantoprazole  40 mg Oral Daily  . potassium chloride  20 mEq Oral BID  . spironolactone  12.5 mg Oral Daily  . tobramycin  1 drop Left Eye Q6H    OBJECTIVE  Vitals:   11/28/15 1225 11/28/15 2124 11/29/15 0514 11/29/15 0952  BP: 126/69 126/68 133/74 129/73  Pulse: 71 69 70 73  Resp: 19 (!) 22 20   Temp: 98.8 F (37.1 C) 98.2 F (36.8 C) 98.7 F (37.1 C)   TempSrc: Oral Oral Oral   SpO2: 94% 96% 96%   Weight:   220 lb 8 oz (100 kg)   Height:   5\' 10"  (1.778 m)     Intake/Output Summary (Last 24 hours) at 11/29/15 1147 Last data filed at 11/29/15 1018  Gross per 24 hour  Intake              480 ml  Output              900 ml  Net             -420 ml   Filed Weights   11/27/15 0349 11/28/15 0459 11/29/15 0514  Weight: 219 lb 1.6 oz (99.4 kg) 218 lb 6.4 oz (99.1 kg) 220 lb 8 oz (100 kg)    PHYSICAL EXAM  General: Pleasant, NAD. Neuro: Alert and oriented X 3. Moves all extremities spontaneously. Psych: Normal affect. HEENT:  Normal  Neck: Supple  without bruits or JVD. Lungs:  Resp regular and unlabored, CTA. Heart: RRR no s3, s4, or murmurs. Abdomen: Soft, non-tender, non-distended, BS + x 4.  Extremities: No clubbing, cyanosis or edema. DP/PT/Radials 2+ and equal bilaterally.  Accessory Clinical Findings  CBC  Recent Labs  11/28/15 0228 11/29/15 0352  WBC 9.3 8.6  HGB 11.6* 10.8*  HCT 36.4* 34.4*  MCV 99.2 99.1  PLT 101* 123*   Basic Metabolic Panel  Recent Labs  11/27/15 0427 11/28/15 0228  NA 142  --   K 3.8  --   CL 110  --   CO2 22  --   GLUCOSE 102*  --   BUN 31*  --   CREATININE 0.87  --   CALCIUM 8.5*  --   MG  --  2.8*   Liver Function Tests No results for input(s): AST, ALT, ALKPHOS, BILITOT, PROT, ALBUMIN in the last 72 hours. No results for input(s): LIPASE, AMYLASE in the last 72 hours. Cardiac Enzymes No results for input(s): CKTOTAL, CKMB, CKMBINDEX,  TROPONINI in the last 72 hours. BNP Invalid input(s): POCBNP D-Dimer No results for input(s): DDIMER in the last 72 hours. Hemoglobin A1C No results for input(s): HGBA1C in the last 72 hours. Fasting Lipid Panel No results for input(s): CHOL, HDL, LDLCALC, TRIG, CHOLHDL, LDLDIRECT in the last 72 hours. Thyroid Function Tests No results for input(s): TSH, T4TOTAL, T3FREE, THYROIDAB in the last 72 hours.  Invalid input(s): FREET3  TELE  Nsr  Radiology/Studies  Ct Head Wo Contrast  Result Date: 11/22/2015 CLINICAL DATA:  Concern regarding anoxic encephalopathy. Respiratory failure and heart failure. EXAM: CT HEAD WITHOUT CONTRAST TECHNIQUE: Contiguous axial images were obtained from the base of the skull through the vertex without intravenous contrast. COMPARISON:  11/17/2015 FINDINGS: Brain: The brain demonstrates stable CT appearance with no evidence of hemorrhage, infarction, edema, mass effect, extra-axial fluid collection, hydrocephalus or mass lesion. Vascular: No hyperdense vessel or unexpected calcification. Skull: The skull is  unremarkable. Sinuses/Orbits: Mucosal thickening within both maxillary antra, left greater than right. Additional mucosal thickening in bilateral ethmoid and sphenoid air cells. IMPRESSION: Stable head CT demonstrating no acute findings or evidence of visible infarcts. Mucosal sinus thickening. Electronically Signed   By: Irish Lack M.D.   On: 11/22/2015 16:03   Ct Head Wo Contrast  Result Date: 11/17/2015 CLINICAL DATA:  Acute encephalopathy. Cardiac arrest with clean left heart catheterization today. EXAM: CT HEAD WITHOUT CONTRAST TECHNIQUE: Contiguous axial images were obtained from the base of the skull through the vertex without intravenous contrast. COMPARISON:  None. FINDINGS: Brain: Mild cerebral atrophy. No ventricular dilatation. No mass effect or midline shift. No abnormal extra-axial fluid collections. Gray-white matter junctions are distinct. Basal cisterns are not effaced. No evidence of acute intracranial hemorrhage. Vascular: No hyperdense vessel or unexpected calcification. Skull: No depressed skull fractures. Sinuses/Orbits: Diffuse opacification of a ethmoid air cells and sphenoid sinuses with air-fluid levels in the maxillary antra and mucosal thickening in the frontal sinuses. Mastoid air cells are not opacified. Other: None. IMPRESSION: No acute intracranial abnormalities. Fluid in the paranasal sinuses. Electronically Signed   By: Burman Nieves M.D.   On: 11/17/2015 01:47   Ct Angio Chest Pe W Or Wo Contrast  Result Date: 11/22/2015 CLINICAL DATA:  Cardiac arrest and respiratory failure. Aspiration pneumonia. EXAM: CT ANGIOGRAPHY CHEST WITH CONTRAST TECHNIQUE: Multidetector CT imaging of the chest was performed using the standard protocol during bolus administration of intravenous contrast. Multiplanar CT image reconstructions and MIPs were obtained to evaluate the vascular anatomy. CONTRAST:  80 mL Isovue 370 IV COMPARISON:  Chest x-ray earlier today. FINDINGS: Cardiovascular:  Pulmonary arteries are well opacified. There is no evidence of pulmonary embolism. The thoracic aorta is of normal caliber. There is separate origin of the left vertebral artery off of the aortic arch. The heart is mildly enlarged. No pericardial effusion identified. There is some calcified plaque in the LAD. Mediastinum/Nodes: No evidence of mediastinal masses or lymphadenopathy. Some small mediastinal lymph nodes present. No evidence of hilar lymphadenopathy. Lungs/Pleura: Ground-glass opacity and alveolar airspace disease in the upper lobes bilaterally and extending into the lingula. This likely represents alveolar edema. There is dense consolidation and atelectasis of both lower lobes in a fairly symmetric pattern. This most likely represents atelectasis but there may also be a component of aspiration pneumonia. No associated pleural effusion. No pneumothorax. Endotracheal tube present with the tip above the carina. Upper Abdomen: Nasogastric tube enters the stomach. Musculoskeletal: No significant bony abnormalities. Review of the MIP images confirms the above findings.  IMPRESSION: 1. No evidence of pulmonary embolism. 2. Calcified plaque seen in the distribution of the LAD. 3. Pulmonary edema with alveolar opacities in both upper lobes. Dense airspace consolidation and volume loss in the lower lobes bilaterally. This most likely represents atelectasis. Component of aspiration pneumonia cannot be excluded. Electronically Signed   By: Irish LackGlenn  Yamagata M.D.   On: 11/22/2015 16:08   Dg Chest Port 1 View  Result Date: 11/24/2015 CLINICAL DATA:  Respiratory failure. EXAM: PORTABLE CHEST 1 VIEW COMPARISON:  11/23/2015 FINDINGS: Endotracheal tube remains in place with tip just a below the level of the clavicular heads, well above the carina. Left jugular central venous catheter is unchanged and terminates over the upper SVC. Enteric tube courses towards the upper abdomen with tip not imaged. Cardiac silhouette is  upper limits of normal to mildly enlarged in size, unchanged. Lung volumes remain diminished with mildly increased patchy opacity in the left lung base. No sizable pleural effusion or pneumothorax is identified. IMPRESSION: Low lung volumes with mildly increased left basilar opacity, likely atelectasis. Electronically Signed   By: Sebastian AcheAllen  Grady M.D.   On: 11/24/2015 08:13   Dg Chest Port 1 View  Result Date: 11/23/2015 CLINICAL DATA:  New onset AFib status post cardiac arrest and hypothermia protocol EXAM: PORTABLE CHEST 1 VIEW COMPARISON:  CTA chest dated 11/22/2015 FINDINGS: Endotracheal tube terminates 2.5 cm above the carina. Low lung volumes. No focal consolidation. No pleural effusion or pneumothorax. Heart is top-normal in size. Left IJ venous catheter terminates in the mid SVC. IMPRESSION: Endotracheal tube terminates 2.5 cm above the carina. Low lung volumes. Electronically Signed   By: Charline BillsSriyesh  Krishnan M.D.   On: 11/23/2015 07:44   Dg Chest Port 1 View  Result Date: 11/22/2015 CLINICAL DATA:  Respiratory failure. EXAM: PORTABLE CHEST 1 VIEW COMPARISON:  11/21/2015 FINDINGS: Endotracheal tube remains with the tip approximately 3 cm above the carina. Stable positioning of left jugular central line with the tip at the origin of the SVC. Slight worsening of atelectasis in the left perihilar region. Stable elevation of the right hemidiaphragm. No overt edema, pleural effusions or pneumothorax identified. IMPRESSION: Slight increase in left perihilar atelectasis. Electronically Signed   By: Irish LackGlenn  Yamagata M.D.   On: 11/22/2015 08:13   Dg Chest Port 1 View  Result Date: 11/21/2015 CLINICAL DATA:  Intubation. EXAM: PORTABLE CHEST 1 VIEW COMPARISON:  11/20/2015. FINDINGS: Interim removal Swan-Ganz catheter. Endotracheal tube, NG tube, left IJ line and stable position. Stable cardiomegaly. Left lower lobe infiltrate. No pleural effusion or pneumothorax. Prior cervical spine fusion. IMPRESSION: 1. Interim  removal of Swan-Ganz catheter. Remaining lines and tubes in stable position. 2.  Stable cardiomegaly with mild pulmonary venous congestion. 3. Improving bibasilar atelectasis. Persistent infiltrate left lower lobe. Electronically Signed   By: Maisie Fushomas  Register   On: 11/21/2015 06:47   Dg Chest Port 1 View  Result Date: 11/20/2015 CLINICAL DATA:  Cardiac arrest. EXAM: PORTABLE CHEST 1 VIEW COMPARISON:  11/19/2015 . FINDINGS: Endotracheal tube, NG tube, Swan-Ganz catheter in stable position. Cardiomegaly with mild pulmonary venous congestion noted. Low lung volumes with mild bibasilar atelectasis and/or infiltrates. No pleural effusion or pneumothorax .Prior cervical spine fusion. IMPRESSION: 1.  Lines and tubes in stable position. 2. Cardiomegaly with mild pulmonary venous congestion. 3. Low lung volumes with mild bibasilar atelectasis and/or infiltrates. Electronically Signed   By: Maisie Fushomas  Register   On: 11/20/2015 07:09   Dg Chest Port 1 View  Result Date: 11/19/2015 CLINICAL DATA:  Shortness  of breath. EXAM: PORTABLE CHEST 1 VIEW COMPARISON:  11/18/2015. FINDINGS: Endotracheal tube, Swan-Ganz catheter, NG tube, left IJ line in stable position. Stable cardiomegaly. Unchanged bilateral pulmonary infiltrates and or edema, left side greater right. No pleural effusion or pneumothorax. Prior cervical spine fusion. IMPRESSION: 1. Lines and tubes in stable position. 2. Unchanged bilateral pulmonary infiltrates and or edema, left side greater right. 3. Stable cardiomegaly. Electronically Signed   By: Maisie Fus  Register   On: 11/19/2015 06:57   Dg Chest Port 1 View  Result Date: 11/18/2015 CLINICAL DATA:  Central line placement. EXAM: PORTABLE CHEST 1 VIEW COMPARISON:  11/17/2015. FINDINGS: Endotracheal tube, NG tube, Swan-Ganz catheter, left IJ line stable position. Stable cardiomegaly. Unchanged bilateral infiltrates and/or pneumonia. No pleural effusion or pneumothorax. Prior cervical spine fusion. IMPRESSION:  1.  Lines and tubes in stable position. 2. Unchanged bilateral pulmonary infiltrates and/or edema, left side greater than right. 3. Stable cardiomegaly. Electronically Signed   By: Maisie Fus  Register   On: 11/18/2015 06:55   Dg Chest Port 1 View  Result Date: 11/17/2015 CLINICAL DATA:  Swan-Ganz catheter placement. EXAM: PORTABLE CHEST 1 VIEW COMPARISON:  Yesterday. FINDINGS: Interval right jugular Swan-Ganz catheter with its tip in the intersegmental pulmonary artery on the right. No pneumothorax. Stable left jugular catheter. Endotracheal tube in satisfactory position. Nasogastric tube tip and side hole in the proximal stomach. Bilateral airspace opacity with no significant overall change. Normal sized heart. No pleural fluid seen. Cervical spine fixation hardware. IMPRESSION: 1. Right jugular catheter tip in the intersegmental pulmonary artery on the right without pneumothorax. 2. No significant change in probable bilateral pneumonia. Electronically Signed   By: Beckie Salts M.D.   On: 11/17/2015 17:09   Dg Chest Port 1 View  Result Date: 11/17/2015 CLINICAL DATA:  Respiratory failure.  Central line placement. EXAM: PORTABLE CHEST 1 VIEW COMPARISON:  11/16/2015 at at 21:22 FINDINGS: There is a new left jugular central line with tip in the SVC at the azygos vein junction. No significant pneumothorax. Endotracheal tube remains satisfactorily positioned with its tip approximately 4.5 cm above the carina. The enteric tube extends into the stomach and beyond the inferior edge of the image. Airspace opacities persist bilaterally without significant interval change. IMPRESSION: 1. New left jugular central line appears satisfactorily positioned. No significant pneumothorax. 2. Continued satisfactory positions of the ET tube and NG tube. 3. Persistent airspace opacities bilaterally without significant interval change. Electronically Signed   By: Ellery Plunk M.D.   On: 11/17/2015 01:19   Dg Chest Portable 1  View  Result Date: 11/16/2015 CLINICAL DATA:  Respiratory failure.  Intubation. EXAM: PORTABLE CHEST 1 VIEW COMPARISON:  None. FINDINGS: The endotracheal tube is 3.3 cm above the carina. The enteric tube extends at least to the distal esophagus, continuing B on the inferior edge of the image. Diffuse airspace opacities are present bilaterally, left worse than right. No large pneumothorax or large effusion evident on this supine portable radiograph. IMPRESSION: 1. Satisfactorily positioned ETT. Enteric tube extends beyond the inferior edge of the image but reaches at least as far as the distal esophagus. 2. Diffuse airspace opacities bilaterally, left worse than right. Electronically Signed   By: Ellery Plunk M.D.   On: 11/16/2015 21:45   Dg Swallowing Func-speech Pathology  Result Date: 11/25/2015 Objective Swallowing Evaluation: Type of Study: MBS-Modified Barium Swallow Study Patient Details Name: ISAEL STILLE MRN: [id number] Date of Birth: February 26, 1956 Today's Date: 11/25/2015 Time: SLP Start Time (ACUTE ONLY): 1253-SLP Stop Time (ACUTE  ONLY): 1318 SLP Time Calculation (min) (ACUTE ONLY): 25 min Past Medical History: Past Medical History: Diagnosis Date . Atrial fibrillation with rapid ventricular response (HCC) 11/20/2015 Past Surgical History: Past Surgical History: Procedure Laterality Date . CARDIAC CATHETERIZATION N/A 11/16/2015  Procedure: Left Heart Cath and Coronary Angiography;  Surgeon: Peter M [place], MD;  Location: [facility] INVASIVE CV LAB;  Service: Cardiovascular;  Laterality: N/A; HPI: Pt is 60 y.o male with obesity and hypertension who was admitted due to cardiac arrest. Pt was intubated for 8 days and was extubated on 9/4. Subjective: Pt was alert and cooperative for swallow evaluation Assessment / Plan / Recommendation CHL IP CLINICAL IMPRESSIONS 11/25/2015 Therapy Diagnosis Mild pharyngeal phase dysphagia Clinical Impression Upon arrival, pt did cough up clear secretions before MBS began.  Following the study, pt presents with mild pharyngeal dysphagia. Pt displayed a delay in swallow to the vallecula and reduced epiglottic closure when given thin liquids. While the pt did not aspirate during MBS, pt had silent penetration to the vocal cords during thin liquid and solid trials. It is unclear whether the penetration observed during the  solid trial was due to puree residue in the vallecula or the solid bolus. Pt required cues for throat clearing to clear penetration. Recommend Dys 2 diet and thin liquid with likely diet improvement given time post extubation.  Impact on safety and function Mild aspiration risk   CHL IP TREATMENT RECOMMENDATION 11/25/2015 Treatment Recommendations Therapy as outlined in treatment plan below   Prognosis 11/25/2015 Prognosis for Safe Diet Advancement Good Barriers to Reach Goals Cognitive deficits Barriers/Prognosis Comment -- CHL IP DIET RECOMMENDATION 11/25/2015 SLP Diet Recommendations Dysphagia 2 (Fine chop) solids;Thin liquid Liquid Administration via Cup;Straw Medication Administration Whole meds with puree Compensations Slow rate;Small sips/bites;Minimize environmental distractions;Clear throat intermittently Postural Changes Remain semi-upright after after feeds/meals (Comment);Seated upright at 90 degrees   CHL IP OTHER RECOMMENDATIONS 11/25/2015 Recommended Consults -- Oral Care Recommendations Oral care BID Other Recommendations Have oral suction available   CHL IP FOLLOW UP RECOMMENDATIONS 11/25/2015 Follow up Recommendations 24 hour supervision/assistance   CHL IP FREQUENCY AND DURATION 11/25/2015 Speech Therapy Frequency (ACUTE ONLY) min 2x/week Treatment Duration 2 weeks      CHL IP ORAL PHASE 11/25/2015 Oral Phase WFL Oral - Pudding Teaspoon -- Oral - Pudding Cup -- Oral - Honey Teaspoon -- Oral - Honey Cup -- Oral - Nectar Teaspoon -- Oral - Nectar Cup -- Oral - Nectar Straw -- Oral - Thin Teaspoon -- Oral - Thin Cup -- Oral - Thin Straw -- Oral - Puree -- Oral - Mech  Soft -- Oral - Regular -- Oral - Multi-Consistency -- Oral - Pill -- Oral Phase - Comment --  CHL IP PHARYNGEAL PHASE 11/25/2015 Pharyngeal Phase Impaired Pharyngeal- Pudding Teaspoon -- Pharyngeal -- Pharyngeal- Pudding Cup -- Pharyngeal -- Pharyngeal- Honey Teaspoon -- Pharyngeal -- Pharyngeal- Honey Cup -- Pharyngeal -- Pharyngeal- Nectar Teaspoon -- Pharyngeal -- Pharyngeal- Nectar Cup -- Pharyngeal -- Pharyngeal- Nectar Straw -- Pharyngeal -- Pharyngeal- Thin Teaspoon -- Pharyngeal -- Pharyngeal- Thin Cup Penetration/Aspiration during swallow;Delayed swallow initiation-vallecula;Reduced epiglottic inversion Pharyngeal Material enters airway, CONTACTS cords and not ejected out Pharyngeal- Thin Straw Penetration/Aspiration during swallow;Delayed swallow initiation-vallecula;Reduced epiglottic inversion Pharyngeal Material enters airway, CONTACTS cords and not ejected out Pharyngeal- Puree Pharyngeal residue - valleculae;Penetration/Apiration after swallow Pharyngeal Material enters airway, CONTACTS cords and not ejected out Pharyngeal- Mechanical Soft -- Pharyngeal -- Pharyngeal- Regular Pharyngeal residue - valleculae;Penetration/Aspiration during swallow Pharyngeal Material enters airway, CONTACTS cords and not  ejected out Pharyngeal- Multi-consistency -- Pharyngeal -- Pharyngeal- Pill WFL Pharyngeal -- Pharyngeal Comment --  CHL IP CERVICAL ESOPHAGEAL PHASE 11/25/2015 Cervical Esophageal Phase WFL Pudding Teaspoon -- Pudding Cup -- Honey Teaspoon -- Honey Cup -- Nectar Teaspoon -- Nectar Cup -- Nectar Straw -- Thin Teaspoon -- Thin Cup -- Thin Straw -- Puree -- Mechanical Soft -- Regular -- Multi-consistency -- Pill -- Cervical Esophageal Comment -- No flowsheet data found. Maxcine Ham 11/25/2015, 3:14 PM Note populated for Jearl Klinefelter, student SLP  Maxcine Ham, M.A. CCC-SLP [phone]             Mr Card Morphology Wo/w Cm  Result Date: 11/26/2015 CLINICAL DATA:  Cardiac Arrest EXAM: CARDIAC MRI  TECHNIQUE: The patient was scanned on a 1.5 Tesla GE magnet. A dedicated cardiac coil was used. Functional imaging was done using Fiesta sequences. 2,3, and 4 chamber views were done to assess for RWMA's. Modified Simpson's rule using a short axis stack was used to calculate an ejection fraction on a dedicated work [employer]. The patient received 28 cc of Multihance. After 10 minutes inversion recovery sequences were used to assess for infiltration and scar tissue. CONTRAST:  28 cc Multihance FINDINGS: All 4 cardiac chambers were normal in size and function. There was no ASD/VSD or pericardial effusion. The septum was 14 mm with no evidence of HOCM or SAM. The quantitative EF was 68% (EDV 130 cc ESV 41 cc SV 89 cc) Delayed enhancement showed no scar, infiltration or infarct. RV size and function were normal Dysplasia sequences not performed IMPRESSION: 1) Moderate LVH EF 68% no RWMA;s 2) No delayed gadolinium uptake No scar/infiltration of LV myocardium 3) Normal RV size and function Charlton Haws Electronically Signed   By: Charlton Haws M.D.   On: 11/26/2015 10:13    ASSESSMENT AND PLAN  1. VF arrest - etiology is unclear. Plan for ICD on Monday. Defer flecainide and epi challenge to Dr. Crissie Sickles. 2. Anoxic encephalopathy - he has recovered neurologically 3. QT prolongation - this was present early after arrest but may well be due to his cooling. 4. LV dysfunction - also appears to have normalized. No scar on MR  Maysun Meditz,M.D.  11/29/2015 11:47 AM

## 2015-11-30 LAB — CBC
HEMATOCRIT: 37.3 % — AB (ref 39.0–52.0)
HEMOGLOBIN: 11.7 g/dL — AB (ref 13.0–17.0)
MCH: 31 pg (ref 26.0–34.0)
MCHC: 31.4 g/dL (ref 30.0–36.0)
MCV: 98.9 fL (ref 78.0–100.0)
Platelets: 157 10*3/uL (ref 150–400)
RBC: 3.77 MIL/uL — AB (ref 4.22–5.81)
RDW: 12.8 % (ref 11.5–15.5)
WBC: 10.8 10*3/uL — AB (ref 4.0–10.5)

## 2015-11-30 LAB — APTT: APTT: 58 s — AB (ref 24–36)

## 2015-11-30 LAB — BASIC METABOLIC PANEL
ANION GAP: 6 (ref 5–15)
BUN: 13 mg/dL (ref 6–20)
CHLORIDE: 110 mmol/L (ref 101–111)
CO2: 23 mmol/L (ref 22–32)
Calcium: 8.8 mg/dL — ABNORMAL LOW (ref 8.9–10.3)
Creatinine, Ser: 0.83 mg/dL (ref 0.61–1.24)
Glucose, Bld: 103 mg/dL — ABNORMAL HIGH (ref 65–99)
POTASSIUM: 3.8 mmol/L (ref 3.5–5.1)
SODIUM: 139 mmol/L (ref 135–145)

## 2015-11-30 MED ORDER — CEFAZOLIN SODIUM-DEXTROSE 2-4 GM/100ML-% IV SOLN
2.0000 g | INTRAVENOUS | Status: AC
  Administered 2015-12-01: 2 g via INTRAVENOUS

## 2015-11-30 MED ORDER — SODIUM CHLORIDE 0.9 % IR SOLN
80.0000 mg | Status: AC
  Administered 2015-12-01: 80 mg
  Filled 2015-11-30: qty 2

## 2015-11-30 MED ORDER — SODIUM CHLORIDE 0.9 % IV SOLN
INTRAVENOUS | Status: DC
  Administered 2015-12-01: 06:00:00 via INTRAVENOUS

## 2015-11-30 NOTE — Progress Notes (Signed)
Patient ID: Arthur Chase, male   DOB: 03/24/1955, 60 y.o.   MRN: [id number]    Patient Name: Arthur Chase Date of Encounter: 11/30/2015     Principal Problem:   Cardiac arrest [facility](HCC) Active Problems:   Acute respiratory failure with hypoxemia (HCC)   Acute encephalopathy   Cardiogenic shock (HCC)   Anoxic brain injury (HCC)   Atrial fibrillation with rapid ventricular response (HCC)   Acute systolic CHF (congestive heart failure) (HCC)   Hypokalemia   NSVT (nonsustained ventricular tachycardia) (HCC)   VF (ventricular fibrillation) (HCC)   Heparin induced thrombocytopenia (HCC)    SUBJECTIVE  No chest pain or sob. No palpitations.  CURRENT MEDS . amiodarone  400 mg Oral Daily  . Chlorhexidine Gluconate Cloth  6 each Topical Q0600  . losartan  25 mg Oral Daily  . metoprolol succinate  12.5 mg Oral Daily  . multivitamin with minerals  1 tablet Oral Daily  . mupirocin ointment  1 application Nasal BID  . pantoprazole  40 mg Oral Daily  . potassium chloride  20 mEq Oral BID  . spironolactone  12.5 mg Oral Daily  . tobramycin  1 drop Left Eye Q6H    OBJECTIVE  Vitals:   11/29/15 1407 11/29/15 2050 11/30/15 0446 11/30/15 1100  BP: 128/73 127/75 138/83 129/79  Pulse: 72 70 81 84  Resp: 18 18 18    Temp: 98.7 F (37.1 C) 99.6 F (37.6 C) 98.5 F (36.9 C)   TempSrc: Oral Oral Oral   SpO2: 96% 97% 95%   Weight:   219 lb 12.8 oz (99.7 kg)   Height:        Intake/Output Summary (Last 24 hours) at 11/30/15 1132 Last data filed at 11/30/15 0800  Gross per 24 hour  Intake              720 ml  Output                0 ml  Net              720 ml   Filed Weights   11/28/15 0459 11/29/15 0514 11/30/15 0446  Weight: 218 lb 6.4 oz (99.1 kg) 220 lb 8 oz (100 kg) 219 lb 12.8 oz (99.7 kg)    PHYSICAL EXAM  General: Pleasant, NAD. Neuro: Alert and oriented X 3. Moves all extremities spontaneously. Psych: Normal affect. HEENT:  Normal  Neck: Supple without bruits  or JVD. Lungs:  Resp regular and unlabored, CTA. Heart: RRR no s3, s4, or murmurs. Abdomen: Soft, non-tender, non-distended, BS + x 4.  Extremities: No clubbing, cyanosis or edema. DP/PT/Radials 2+ and equal bilaterally.  Accessory Clinical Findings  CBC  Recent Labs  11/29/15 0352 11/30/15 0336  WBC 8.6 10.8*  HGB 10.8* 11.7*  HCT 34.4* 37.3*  MCV 99.1 98.9  PLT 123* 157   Basic Metabolic Panel  Recent Labs  11/28/15 0228 11/30/15 0336  NA  --  139  K  --  3.8  CL  --  110  CO2  --  23  GLUCOSE  --  103*  BUN  --  13  CREATININE  --  0.83  CALCIUM  --  8.8*  MG 2.8*  --    Liver Function Tests No results for input(s): AST, ALT, ALKPHOS, BILITOT, PROT, ALBUMIN in the last 72 hours. No results for input(s): LIPASE, AMYLASE in the last 72 hours. Cardiac Enzymes No results for input(s): CKTOTAL, CKMB, CKMBINDEX, TROPONINI  in the last 72 hours. BNP Invalid input(s): POCBNP D-Dimer No results for input(s): DDIMER in the last 72 hours. Hemoglobin A1C No results for input(s): HGBA1C in the last 72 hours. Fasting Lipid Panel No results for input(s): CHOL, HDL, LDLCALC, TRIG, CHOLHDL, LDLDIRECT in the last 72 hours. Thyroid Function Tests No results for input(s): TSH, T4TOTAL, T3FREE, THYROIDAB in the last 72 hours.  Invalid input(s): FREET3  TELE  Nsr  Radiology/Studies  Ct Head Wo Contrast  Result Date: 11/22/2015 CLINICAL DATA:  Concern regarding anoxic encephalopathy. Respiratory failure and heart failure. EXAM: CT HEAD WITHOUT CONTRAST TECHNIQUE: Contiguous axial images were obtained from the base of the skull through the vertex without intravenous contrast. COMPARISON:  11/17/2015 FINDINGS: Brain: The brain demonstrates stable CT appearance with no evidence of hemorrhage, infarction, edema, mass effect, extra-axial fluid collection, hydrocephalus or mass lesion. Vascular: No hyperdense vessel or unexpected calcification. Skull: The skull is unremarkable.  Sinuses/Orbits: Mucosal thickening within both maxillary antra, left greater than right. Additional mucosal thickening in bilateral ethmoid and sphenoid air cells. IMPRESSION: Stable head CT demonstrating no acute findings or evidence of visible infarcts. Mucosal sinus thickening. Electronically Signed   By: Irish Lack M.D.   On: 11/22/2015 16:03   Ct Head Wo Contrast  Result Date: 11/17/2015 CLINICAL DATA:  Acute encephalopathy. Cardiac arrest with clean left heart catheterization today. EXAM: CT HEAD WITHOUT CONTRAST TECHNIQUE: Contiguous axial images were obtained from the base of the skull through the vertex without intravenous contrast. COMPARISON:  None. FINDINGS: Brain: Mild cerebral atrophy. No ventricular dilatation. No mass effect or midline shift. No abnormal extra-axial fluid collections. Gray-white matter junctions are distinct. Basal cisterns are not effaced. No evidence of acute intracranial hemorrhage. Vascular: No hyperdense vessel or unexpected calcification. Skull: No depressed skull fractures. Sinuses/Orbits: Diffuse opacification of a ethmoid air cells and sphenoid sinuses with air-fluid levels in the maxillary antra and mucosal thickening in the frontal sinuses. Mastoid air cells are not opacified. Other: None. IMPRESSION: No acute intracranial abnormalities. Fluid in the paranasal sinuses. Electronically Signed   By: Burman Nieves M.D.   On: 11/17/2015 01:47   Ct Angio Chest Pe W Or Wo Contrast  Result Date: 11/22/2015 CLINICAL DATA:  Cardiac arrest and respiratory failure. Aspiration pneumonia. EXAM: CT ANGIOGRAPHY CHEST WITH CONTRAST TECHNIQUE: Multidetector CT imaging of the chest was performed using the standard protocol during bolus administration of intravenous contrast. Multiplanar CT image reconstructions and MIPs were obtained to evaluate the vascular anatomy. CONTRAST:  80 mL Isovue 370 IV COMPARISON:  Chest x-ray earlier today. FINDINGS: Cardiovascular: Pulmonary  arteries are well opacified. There is no evidence of pulmonary embolism. The thoracic aorta is of normal caliber. There is separate origin of the left vertebral artery off of the aortic arch. The heart is mildly enlarged. No pericardial effusion identified. There is some calcified plaque in the LAD. Mediastinum/Nodes: No evidence of mediastinal masses or lymphadenopathy. Some small mediastinal lymph nodes present. No evidence of hilar lymphadenopathy. Lungs/Pleura: Ground-glass opacity and alveolar airspace disease in the upper lobes bilaterally and extending into the lingula. This likely represents alveolar edema. There is dense consolidation and atelectasis of both lower lobes in a fairly symmetric pattern. This most likely represents atelectasis but there may also be a component of aspiration pneumonia. No associated pleural effusion. No pneumothorax. Endotracheal tube present with the tip above the carina. Upper Abdomen: Nasogastric tube enters the stomach. Musculoskeletal: No significant bony abnormalities. Review of the MIP images confirms the above findings. IMPRESSION:  1. No evidence of pulmonary embolism. 2. Calcified plaque seen in the distribution of the LAD. 3. Pulmonary edema with alveolar opacities in both upper lobes. Dense airspace consolidation and volume loss in the lower lobes bilaterally. This most likely represents atelectasis. Component of aspiration pneumonia cannot be excluded. Electronically Signed   By: Irish Lack M.D.   On: 11/22/2015 16:08   Dg Chest Port 1 View  Result Date: 11/24/2015 CLINICAL DATA:  Respiratory failure. EXAM: PORTABLE CHEST 1 VIEW COMPARISON:  11/23/2015 FINDINGS: Endotracheal tube remains in place with tip just a below the level of the clavicular heads, well above the carina. Left jugular central venous catheter is unchanged and terminates over the upper SVC. Enteric tube courses towards the upper abdomen with tip not imaged. Cardiac silhouette is upper limits  of normal to mildly enlarged in size, unchanged. Lung volumes remain diminished with mildly increased patchy opacity in the left lung base. No sizable pleural effusion or pneumothorax is identified. IMPRESSION: Low lung volumes with mildly increased left basilar opacity, likely atelectasis. Electronically Signed   By: Sebastian Ache M.D.   On: 11/24/2015 08:13   Dg Chest Port 1 View  Result Date: 11/23/2015 CLINICAL DATA:  New onset AFib status post cardiac arrest and hypothermia protocol EXAM: PORTABLE CHEST 1 VIEW COMPARISON:  CTA chest dated 11/22/2015 FINDINGS: Endotracheal tube terminates 2.5 cm above the carina. Low lung volumes. No focal consolidation. No pleural effusion or pneumothorax. Heart is top-normal in size. Left IJ venous catheter terminates in the mid SVC. IMPRESSION: Endotracheal tube terminates 2.5 cm above the carina. Low lung volumes. Electronically Signed   By: Charline Bills M.D.   On: 11/23/2015 07:44   Dg Chest Port 1 View  Result Date: 11/22/2015 CLINICAL DATA:  Respiratory failure. EXAM: PORTABLE CHEST 1 VIEW COMPARISON:  11/21/2015 FINDINGS: Endotracheal tube remains with the tip approximately 3 cm above the carina. Stable positioning of left jugular central line with the tip at the origin of the SVC. Slight worsening of atelectasis in the left perihilar region. Stable elevation of the right hemidiaphragm. No overt edema, pleural effusions or pneumothorax identified. IMPRESSION: Slight increase in left perihilar atelectasis. Electronically Signed   By: Irish Lack M.D.   On: 11/22/2015 08:13   Dg Chest Port 1 View  Result Date: 11/21/2015 CLINICAL DATA:  Intubation. EXAM: PORTABLE CHEST 1 VIEW COMPARISON:  11/20/2015. FINDINGS: Interim removal Swan-Ganz catheter. Endotracheal tube, NG tube, left IJ line and stable position. Stable cardiomegaly. Left lower lobe infiltrate. No pleural effusion or pneumothorax. Prior cervical spine fusion. IMPRESSION: 1. Interim removal of  Swan-Ganz catheter. Remaining lines and tubes in stable position. 2.  Stable cardiomegaly with mild pulmonary venous congestion. 3. Improving bibasilar atelectasis. Persistent infiltrate left lower lobe. Electronically Signed   By: Maisie Fus  Register   On: 11/21/2015 06:47   Dg Chest Port 1 View  Result Date: 11/20/2015 CLINICAL DATA:  Cardiac arrest. EXAM: PORTABLE CHEST 1 VIEW COMPARISON:  11/19/2015 . FINDINGS: Endotracheal tube, NG tube, Swan-Ganz catheter in stable position. Cardiomegaly with mild pulmonary venous congestion noted. Low lung volumes with mild bibasilar atelectasis and/or infiltrates. No pleural effusion or pneumothorax .Prior cervical spine fusion. IMPRESSION: 1.  Lines and tubes in stable position. 2. Cardiomegaly with mild pulmonary venous congestion. 3. Low lung volumes with mild bibasilar atelectasis and/or infiltrates. Electronically Signed   By: Maisie Fus  Register   On: 11/20/2015 07:09   Dg Chest Port 1 View  Result Date: 11/19/2015 CLINICAL DATA:  Shortness of  breath. EXAM: PORTABLE CHEST 1 VIEW COMPARISON:  11/18/2015. FINDINGS: Endotracheal tube, Swan-Ganz catheter, NG tube, left IJ line in stable position. Stable cardiomegaly. Unchanged bilateral pulmonary infiltrates and or edema, left side greater right. No pleural effusion or pneumothorax. Prior cervical spine fusion. IMPRESSION: 1. Lines and tubes in stable position. 2. Unchanged bilateral pulmonary infiltrates and or edema, left side greater right. 3. Stable cardiomegaly. Electronically Signed   By: Maisie Fus  Register   On: 11/19/2015 06:57   Dg Chest Port 1 View  Result Date: 11/18/2015 CLINICAL DATA:  Central line placement. EXAM: PORTABLE CHEST 1 VIEW COMPARISON:  11/17/2015. FINDINGS: Endotracheal tube, NG tube, Swan-Ganz catheter, left IJ line stable position. Stable cardiomegaly. Unchanged bilateral infiltrates and/or pneumonia. No pleural effusion or pneumothorax. Prior cervical spine fusion. IMPRESSION: 1.  Lines  and tubes in stable position. 2. Unchanged bilateral pulmonary infiltrates and/or edema, left side greater than right. 3. Stable cardiomegaly. Electronically Signed   By: Maisie Fus  Register   On: 11/18/2015 06:55   Dg Chest Port 1 View  Result Date: 11/17/2015 CLINICAL DATA:  Swan-Ganz catheter placement. EXAM: PORTABLE CHEST 1 VIEW COMPARISON:  Yesterday. FINDINGS: Interval right jugular Swan-Ganz catheter with its tip in the intersegmental pulmonary artery on the right. No pneumothorax. Stable left jugular catheter. Endotracheal tube in satisfactory position. Nasogastric tube tip and side hole in the proximal stomach. Bilateral airspace opacity with no significant overall change. Normal sized heart. No pleural fluid seen. Cervical spine fixation hardware. IMPRESSION: 1. Right jugular catheter tip in the intersegmental pulmonary artery on the right without pneumothorax. 2. No significant change in probable bilateral pneumonia. Electronically Signed   By: Beckie Salts M.D.   On: 11/17/2015 17:09   Dg Chest Port 1 View  Result Date: 11/17/2015 CLINICAL DATA:  Respiratory failure.  Central line placement. EXAM: PORTABLE CHEST 1 VIEW COMPARISON:  11/16/2015 at at 21:22 FINDINGS: There is a new left jugular central line with tip in the SVC at the azygos vein junction. No significant pneumothorax. Endotracheal tube remains satisfactorily positioned with its tip approximately 4.5 cm above the carina. The enteric tube extends into the stomach and beyond the inferior edge of the image. Airspace opacities persist bilaterally without significant interval change. IMPRESSION: 1. New left jugular central line appears satisfactorily positioned. No significant pneumothorax. 2. Continued satisfactory positions of the ET tube and NG tube. 3. Persistent airspace opacities bilaterally without significant interval change. Electronically Signed   By: Ellery Plunk M.D.   On: 11/17/2015 01:19   Dg Chest Portable 1  View  Result Date: 11/16/2015 CLINICAL DATA:  Respiratory failure.  Intubation. EXAM: PORTABLE CHEST 1 VIEW COMPARISON:  None. FINDINGS: The endotracheal tube is 3.3 cm above the carina. The enteric tube extends at least to the distal esophagus, continuing B on the inferior edge of the image. Diffuse airspace opacities are present bilaterally, left worse than right. No large pneumothorax or large effusion evident on this supine portable radiograph. IMPRESSION: 1. Satisfactorily positioned ETT. Enteric tube extends beyond the inferior edge of the image but reaches at least as far as the distal esophagus. 2. Diffuse airspace opacities bilaterally, left worse than right. Electronically Signed   By: Ellery Plunk M.D.   On: 11/16/2015 21:45   Dg Swallowing Func-speech Pathology  Result Date: 11/25/2015 Objective Swallowing Evaluation: Type of Study: MBS-Modified Barium Swallow Study Patient Details Name: MYCHAL SARAZIN MRN: [id number] Date of Birth: 06/24/1955 Today's Date: 11/25/2015 Time: SLP Start Time (ACUTE ONLY): 1253-SLP Stop Time (ACUTE ONLY):  1318 SLP Time Calculation (min) (ACUTE ONLY): 25 min Past Medical History: Past Medical History: Diagnosis Date . Atrial fibrillation with rapid ventricular response (HCC) 11/20/2015 Past Surgical History: Past Surgical History: Procedure Laterality Date . CARDIAC CATHETERIZATION N/A 11/16/2015  Procedure: Left Heart Cath and Coronary Angiography;  Surgeon: Peter M [place], MD;  Location: [facility] INVASIVE CV LAB;  Service: Cardiovascular;  Laterality: N/A; HPI: Pt is 60 y.o male with obesity and hypertension who was admitted due to cardiac arrest. Pt was intubated for 8 days and was extubated on 9/4. Subjective: Pt was alert and cooperative for swallow evaluation Assessment / Plan / Recommendation CHL IP CLINICAL IMPRESSIONS 11/25/2015 Therapy Diagnosis Mild pharyngeal phase dysphagia Clinical Impression Upon arrival, pt did cough up clear secretions before MBS began.  Following the study, pt presents with mild pharyngeal dysphagia. Pt displayed a delay in swallow to the vallecula and reduced epiglottic closure when given thin liquids. While the pt did not aspirate during MBS, pt had silent penetration to the vocal cords during thin liquid and solid trials. It is unclear whether the penetration observed during the  solid trial was due to puree residue in the vallecula or the solid bolus. Pt required cues for throat clearing to clear penetration. Recommend Dys 2 diet and thin liquid with likely diet improvement given time post extubation.  Impact on safety and function Mild aspiration risk   CHL IP TREATMENT RECOMMENDATION 11/25/2015 Treatment Recommendations Therapy as outlined in treatment plan below   Prognosis 11/25/2015 Prognosis for Safe Diet Advancement Good Barriers to Reach Goals Cognitive deficits Barriers/Prognosis Comment -- CHL IP DIET RECOMMENDATION 11/25/2015 SLP Diet Recommendations Dysphagia 2 (Fine chop) solids;Thin liquid Liquid Administration via Cup;Straw Medication Administration Whole meds with puree Compensations Slow rate;Small sips/bites;Minimize environmental distractions;Clear throat intermittently Postural Changes Remain semi-upright after after feeds/meals (Comment);Seated upright at 90 degrees   CHL IP OTHER RECOMMENDATIONS 11/25/2015 Recommended Consults -- Oral Care Recommendations Oral care BID Other Recommendations Have oral suction available   CHL IP FOLLOW UP RECOMMENDATIONS 11/25/2015 Follow up Recommendations 24 hour supervision/assistance   CHL IP FREQUENCY AND DURATION 11/25/2015 Speech Therapy Frequency (ACUTE ONLY) min 2x/week Treatment Duration 2 weeks      CHL IP ORAL PHASE 11/25/2015 Oral Phase WFL Oral - Pudding Teaspoon -- Oral - Pudding Cup -- Oral - Honey Teaspoon -- Oral - Honey Cup -- Oral - Nectar Teaspoon -- Oral - Nectar Cup -- Oral - Nectar Straw -- Oral - Thin Teaspoon -- Oral - Thin Cup -- Oral - Thin Straw -- Oral - Puree -- Oral - Mech  Soft -- Oral - Regular -- Oral - Multi-Consistency -- Oral - Pill -- Oral Phase - Comment --  CHL IP PHARYNGEAL PHASE 11/25/2015 Pharyngeal Phase Impaired Pharyngeal- Pudding Teaspoon -- Pharyngeal -- Pharyngeal- Pudding Cup -- Pharyngeal -- Pharyngeal- Honey Teaspoon -- Pharyngeal -- Pharyngeal- Honey Cup -- Pharyngeal -- Pharyngeal- Nectar Teaspoon -- Pharyngeal -- Pharyngeal- Nectar Cup -- Pharyngeal -- Pharyngeal- Nectar Straw -- Pharyngeal -- Pharyngeal- Thin Teaspoon -- Pharyngeal -- Pharyngeal- Thin Cup Penetration/Aspiration during swallow;Delayed swallow initiation-vallecula;Reduced epiglottic inversion Pharyngeal Material enters airway, CONTACTS cords and not ejected out Pharyngeal- Thin Straw Penetration/Aspiration during swallow;Delayed swallow initiation-vallecula;Reduced epiglottic inversion Pharyngeal Material enters airway, CONTACTS cords and not ejected out Pharyngeal- Puree Pharyngeal residue - valleculae;Penetration/Apiration after swallow Pharyngeal Material enters airway, CONTACTS cords and not ejected out Pharyngeal- Mechanical Soft -- Pharyngeal -- Pharyngeal- Regular Pharyngeal residue - valleculae;Penetration/Aspiration during swallow Pharyngeal Material enters airway, CONTACTS cords and not ejected  out Pharyngeal- Multi-consistency -- Pharyngeal -- Pharyngeal- Pill WFL Pharyngeal -- Pharyngeal Comment --  CHL IP CERVICAL ESOPHAGEAL PHASE 11/25/2015 Cervical Esophageal Phase WFL Pudding Teaspoon -- Pudding Cup -- Honey Teaspoon -- Honey Cup -- Nectar Teaspoon -- Nectar Cup -- Nectar Straw -- Thin Teaspoon -- Thin Cup -- Thin Straw -- Puree -- Mechanical Soft -- Regular -- Multi-consistency -- Pill -- Cervical Esophageal Comment -- No flowsheet data found. Maxcine Ham 11/25/2015, 3:14 PM Note populated for Jearl Klinefelter, student SLP  Maxcine Ham, M.A. CCC-SLP [phone]             Mr Card Morphology Wo/w Cm  Result Date: 11/26/2015 CLINICAL DATA:  Cardiac Arrest EXAM: CARDIAC MRI  TECHNIQUE: The patient was scanned on a 1.5 Tesla GE magnet. A dedicated cardiac coil was used. Functional imaging was done using Fiesta sequences. 2,3, and 4 chamber views were done to assess for RWMA's. Modified Simpson's rule using a short axis stack was used to calculate an ejection fraction on a dedicated work [employer]. The patient received 28 cc of Multihance. After 10 minutes inversion recovery sequences were used to assess for infiltration and scar tissue. CONTRAST:  28 cc Multihance FINDINGS: All 4 cardiac chambers were normal in size and function. There was no ASD/VSD or pericardial effusion. The septum was 14 mm with no evidence of HOCM or SAM. The quantitative EF was 68% (EDV 130 cc ESV 41 cc SV 89 cc) Delayed enhancement showed no scar, infiltration or infarct. RV size and function were normal Dysplasia sequences not performed IMPRESSION: 1) Moderate LVH EF 68% no RWMA;s 2) No delayed gadolinium uptake No scar/infiltration of LV myocardium 3) Normal RV size and function Charlton Haws Electronically Signed   By: Charlton Haws M.D.   On: 11/26/2015 10:13    ASSESSMENT AND PLAN  1. VF arrest - he is stable. Plan ICD tomorrow after flec/epi challenge 2. Encephalopathy - resolved.  3. QT prolongation - resolved 4. LV dysfunction - resolved and no scar/fat on MRI  Gregg Taylor,M.D.  11/30/2015 11:32 AM

## 2015-11-30 NOTE — Progress Notes (Signed)
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 Heparin     Thrombocytopenia. 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  Assessment: 15 yoM admitted s/p VF arrest and CPR, previously on heparin for rule out ACS then rule out PE. Cath clean and CT showed no evidence of PE so heparin discontinued. Platelets declined to 87 on 9/3 and HIT antigen = 2.629 on 9/5. Platelets appeared to be trending back up so DTI therapy held in setting of planned ICD implantation, but platelets declined again on 9/6. Therefore, started on HIT protocol with argatroban and ICD plans postponed for now. Baseline aPTT 32, PT/INR 15.4/1.22. 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    Argatroban continues this morning with therapeutic aPTT's over last 4 days.  Hgb 11.7 and platelets are now greater than 150,000.   Goal of Therapy:  aPTT 50-90 seconds Monitor platelets by anticoagulation protocol: Yes    Plan:  1. Continue Argatroban at 1.3 mcg/kg/min  2. Daily aPTT and CBC 3. Noted plans for ICD placement on 9/11     Pollyann Samples, PharmD, BCPS 11/30/2015, 12:23 PM Pager: [phone]

## 2015-12-01 ENCOUNTER — Encounter (HOSPITAL_COMMUNITY): Payer: Self-pay | Admitting: Cardiology

## 2015-12-01 ENCOUNTER — Encounter (HOSPITAL_COMMUNITY)
Admission: EM | Disposition: A | Discharge status: Home / Self Care | Payer: Self-pay | Source: Home / Self Care | Attending: Pulmonary Disease

## 2015-12-01 HISTORY — PX: EP IMPLANTABLE DEVICE: SHX172B

## 2015-12-01 LAB — APTT: APTT: 34 s (ref 24–36)

## 2015-12-01 SURGERY — ICD IMPLANT
Anesthesia: LOCAL

## 2015-12-01 MED ORDER — ONDANSETRON HCL 4 MG/2ML IJ SOLN: 4.0000 mg | Freq: Four times a day (QID) | INTRAMUSCULAR | Status: DC | PRN

## 2015-12-01 MED ORDER — ACETAMINOPHEN 325 MG PO TABS
325.0000 mg | ORAL_TABLET | ORAL | Status: DC | PRN
  Administered 2015-12-03: 650 mg via ORAL
  Filled 2015-12-01: qty 2

## 2015-12-01 MED ORDER — EPINEPHRINE HCL 1 MG/ML IJ SOLN
INTRAMUSCULAR | Status: AC
  Filled 2015-12-01: qty 1

## 2015-12-01 MED ORDER — WARFARIN - PHARMACIST DOSING INPATIENT
Freq: Every day | Status: DC
  Administered 2015-12-01: 1

## 2015-12-01 MED ORDER — CEFAZOLIN IN D5W 1 GM/50ML IV SOLN
1.0000 g | Freq: Four times a day (QID) | INTRAVENOUS | Status: AC
  Administered 2015-12-01 – 2015-12-02 (×3): 1 g via INTRAVENOUS
  Filled 2015-12-01 (×3): qty 50

## 2015-12-01 MED ORDER — WARFARIN SODIUM 7.5 MG PO TABS
7.5000 mg | ORAL_TABLET | Freq: Once | ORAL | Status: AC
  Administered 2015-12-01: 7.5 mg via ORAL
  Filled 2015-12-01: qty 1

## 2015-12-01 MED ORDER — COUMADIN BOOK
Freq: Once | Status: AC
  Administered 2015-12-01: 1
  Filled 2015-12-01: qty 1

## 2015-12-01 MED ORDER — FENTANYL CITRATE (PF) 100 MCG/2ML IJ SOLN
INTRAMUSCULAR | Status: DC | PRN
  Administered 2015-12-01: 25 ug via INTRAVENOUS

## 2015-12-01 MED ORDER — IOPAMIDOL (ISOVUE-370) INJECTION 76%
80.0000 mL | Freq: Once | INTRAVENOUS | Status: AC | PRN
  Administered 2015-11-22: 80 mL via INTRAVENOUS

## 2015-12-01 MED ORDER — MIDAZOLAM HCL 5 MG/5ML IJ SOLN
INTRAMUSCULAR | Status: DC | PRN
  Administered 2015-12-01: 2 mg via INTRAVENOUS

## 2015-12-01 MED ORDER — LIDOCAINE HCL (PF) 1 % IJ SOLN
INTRAMUSCULAR | Status: DC | PRN
  Administered 2015-12-01: 45 mL via INTRADERMAL

## 2015-12-01 MED ORDER — SODIUM CHLORIDE 0.9 % IR SOLN
Status: AC
  Filled 2015-12-01: qty 2

## 2015-12-01 MED ORDER — LIDOCAINE HCL (PF) 1 % IJ SOLN
INTRAMUSCULAR | Status: AC
  Filled 2015-12-01: qty 30

## 2015-12-01 MED ORDER — CEFAZOLIN SODIUM-DEXTROSE 2-4 GM/100ML-% IV SOLN
INTRAVENOUS | Status: AC
  Filled 2015-12-01: qty 100

## 2015-12-01 MED ORDER — WARFARIN VIDEO
Freq: Once | Status: AC
  Administered 2015-12-01: 18:00:00

## 2015-12-01 MED ORDER — FENTANYL CITRATE (PF) 100 MCG/2ML IJ SOLN
INTRAMUSCULAR | Status: AC
  Filled 2015-12-01: qty 2

## 2015-12-01 MED ORDER — MIDAZOLAM HCL 5 MG/5ML IJ SOLN
INTRAMUSCULAR | Status: AC
  Filled 2015-12-01: qty 5

## 2015-12-01 MED ORDER — EPINEPHRINE HCL 0.1 MG/ML IJ SOSY
PREFILLED_SYRINGE | INTRAMUSCULAR | Status: DC | PRN
  Administered 2015-12-01: 14.8 mL via INTRAVENOUS
  Administered 2015-12-01: 29.7 mL via INTRAVENOUS
  Administered 2015-12-01: 7.4 mL via INTRAVENOUS

## 2015-12-01 SURGICAL SUPPLY — 8 items
CABLE SURGICAL S-101-97-12 (CABLE) ×2 IMPLANT
ICD VISIA MRI VR DVFB1D4 (ICD Generator) IMPLANT
LEAD SPRINT QUAT SEC 6935M-62 (Lead) ×2 IMPLANT
PAD DEFIB LIFELINK (PAD) ×1 IMPLANT
SHEATH CLASSIC 7F (SHEATH) IMPLANT
SHEATH CLASSIC 9F (SHEATH) ×1 IMPLANT
TRAY PACEMAKER INSERTION (PACKS) ×1 IMPLANT
VISIA MRI VR DVFB1D4 (ICD Generator) ×2 IMPLANT

## 2015-12-01 NOTE — Progress Notes (Addendum)
Patient ID: Arthur Chase, male   DOB: 1955-05-12, 60 y.o.   MRN: [id number]    Patient Name: Arthur Chase Date of Encounter: 12/01/2015     Principal Problem:   Cardiac arrest [facility]) Active Problems:   Acute respiratory failure with hypoxemia (HCC)   Acute encephalopathy   Cardiogenic shock (HCC)   Anoxic brain injury (HCC)   Atrial fibrillation with rapid ventricular response (HCC)   Acute systolic CHF (congestive heart failure) (HCC)   Hypokalemia   NSVT (nonsustained ventricular tachycardia) (HCC)   VF (ventricular fibrillation) (HCC)   Heparin induced thrombocytopenia (HCC)    SUBJECTIVE No chest pain or sob. No palpitations.  CURRENT MEDS . amiodarone  400 mg Oral Daily  .  ceFAZolin (ANCEF) IV  2 g Intravenous To OR  . Chlorhexidine Gluconate Cloth  6 each Topical Q0600  . gentamicin irrigation  80 mg Irrigation To OR  . losartan  25 mg Oral Daily  . metoprolol succinate  12.5 mg Oral Daily  . multivitamin with minerals  1 tablet Oral Daily  . mupirocin ointment  1 application Nasal BID  . pantoprazole  40 mg Oral Daily  . potassium chloride  20 mEq Oral BID  . spironolactone  12.5 mg Oral Daily  . tobramycin  1 drop Left Eye Q6H    OBJECTIVE  Vitals:   11/30/15 1100 11/30/15 1300 11/30/15 2208 12/01/15 0525  BP: 129/79 120/71 127/72 128/78  Pulse: 84 81 72 70  Resp:  18 18 19   Temp:  99.1 F (37.3 C) 98.2 F (36.8 C) 98.4 F (36.9 C)  TempSrc:  Oral Oral Oral  SpO2:  95% 96% 97%  Weight:    217 lb 12.8 oz (98.8 kg)  Height:       No intake or output data in the 24 hours ending 12/01/15 0810 Filed Weights   11/29/15 0514 11/30/15 0446 12/01/15 0525  Weight: 220 lb 8 oz (100 kg) 219 lb 12.8 oz (99.7 kg) 217 lb 12.8 oz (98.8 kg)    PHYSICAL EXAM  General: Pleasant, NAD. Neuro: Alert and oriented X 3. Moves all extremities spontaneously. Psych: Normal affect. HEENT:  Normal  Neck: Supple without bruits or JVD. Lungs:  Resp regular and  unlabored, CTA. Heart: RRR no s3, s4, or murmurs. Abdomen: Soft, non-tender, non-distended  Extremities: No clubbing, cyanosis or edema Accessory Clinical Findings  CBC  Recent Labs  11/29/15 0352 11/30/15 0336  WBC 8.6 10.8*  HGB 10.8* 11.7*  HCT 34.4* 37.3*  MCV 99.1 98.9  PLT 123* 157   Basic Metabolic Panel  Recent Labs  11/30/15 0336  NA 139  K 3.8  CL 110  CO2 23  GLUCOSE 103*  BUN 13  CREATININE 0.83  CALCIUM 8.8*    TELE SR only  Radiology/Studies Mr Card Morphology Wo/w Cm Result Date: 11/26/2015 CLINICAL DATA:  Cardiac Arrest EXAM: CARDIAC MRI TECHNIQUE: The patient was scanned on a 1.5 Tesla GE magnet. A dedicated cardiac coil was used. Functional imaging was done using Fiesta sequences. 2,3, and 4 chamber views were done to assess for RWMA's. Modified Simpson's rule using a short axis stack was used to calculate an ejection fraction on a dedicated work [employer]. The patient received 28 cc of Multihance. After 10 minutes inversion recovery sequences were used to assess for infiltration and scar tissue. CONTRAST:  28 cc Multihance FINDINGS: All 4 cardiac chambers were normal in size and function. There was no ASD/VSD or  pericardial effusion. The septum was 14 mm with no evidence of HOCM or SAM. The quantitative EF was 68% (EDV 130 cc ESV 41 cc SV 89 cc) Delayed enhancement showed no scar, infiltration or infarct. RV size and function were normal Dysplasia sequences not performed IMPRESSION: 1) Moderate LVH EF 68% no RWMA;s 2) No delayed gadolinium uptake No scar/infiltration of LV myocardium 3) Normal RV size and function Charlton HawsPeter Nishan Electronically Signed   By: Charlton HawsPeter  Nishan M.D.   On: 11/26/2015 10:13   Echo this admission demonstrated EF 25-30%, severe biventricular dysfunction.   11/16/15: [facility] Conclusion    The left ventricular systolic function is normal.  LV end diastolic pressure is normal.  The left ventricular ejection fraction  is 55-65% by visual estimate. 1. Normal coronary anatomy 2. Normal LV function     ASSESSMENT AND PLAN  1. VF arrest      None further, normal coronaries by cath, no scar/infiltration on cardiac MRI     Plan ICD today and flec/epi challenge     On amio 400mg  PO daily  No driving 6 months  2. Encephalopathy      resolved.   3. QT prolongation     Resolved  4. 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 Elberta Fortisamnitz  6. New PAF this admit in the setting of VF arrest     CHA2DS2Vasc is 2     Monitor via device for recurrence   Francis DowseRenee Ursuy, PA-C 12/01/2015 8:10 AM   I have seen and examined this patient with Francis Dowseenee Ursuy.  Agree with above, note added to reflect my findings.  On exam, regular rhythm, no murmurs, lungs clear.  Had VF arrest at home with cooling protocol.  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  Off Argatroban.  Edelyn Heidel place ICD today.  Risks and benefits discussed.  Risks include but are not limited to bleeding, infection, tamponade, pneumothorax.  The patient understands these risks and has agreed to the procedure.    Carvel Huskins M. Neal Trulson MD 12/01/2015 11:45 AM

## 2015-12-01 NOTE — Discharge Instructions (Addendum)
Supplemental Discharge Instructions for  Pacemaker/Defibrillator Patients  Activity No heavy lifting or vigorous activity with your left/right arm for 6 to 8 weeks.  Do not raise your left/right arm above your head for one week.  Gradually raise your affected arm as drawn below.             12/05/15                     12/06/15                    12/07/15                   12/08/15 __  NO DRIVING for 6 months  WOUND CARE - Keep the wound area clean and dry.  Do not get this area wet for one week. No showers for one week; you may shower on  12/08/15   . - The tape/steri-strips on your wound will fall off; do not pull them off.  No bandage is needed on the site.  DO  NOT apply any creams, oils, or ointments to the wound area. - If you notice any drainage or discharge from the wound, any swelling or bruising at the site, or you develop a fever > 101? F after you are discharged home, call the office at once.  Special Instructions - You are still able to use cellular telephones; use the ear opposite the side where you have your pacemaker/defibrillator.  Avoid carrying your cellular phone near your device. - When traveling through airports, show security personnel your identification card to avoid being screened in the metal detectors.  Ask the security personnel to use the hand wand. - Avoid arc welding equipment, MRI testing (magnetic resonance imaging), TENS units (transcutaneous nerve stimulators).  Call the office for questions about other devices. - Avoid electrical appliances that are in poor condition or are not properly grounded. - Microwave ovens are safe to be near or to operate.  Additional information for defibrillator patients should your device go off: - If your device goes off ONCE and you feel fine afterward, notify the device clinic nurses. - If your device goes off ONCE and you do not feel well afterward, call 911. - If your device goes off TWICE, call 911. - If your device  goes off THREE times in one day, call 911.  DO NOT DRIVE YOURSELF OR A FAMILY MEMBER WITH A DEFIBRILLATOR TO THE HOSPITAL--CALL 911.      Information on my medicine - Coumadin   (Warfarin)  This medication education was reviewed with me or my healthcare representative as part of my discharge preparation.  The pharmacist that spoke with me during my hospital stay was:  [facility], Maryanna Shape, [facility]  Why was Coumadin prescribed for you? Coumadin was prescribed for you because you have a blood clot or a medical condition that can cause an increased risk of forming blood clots. Blood clots can cause serious health problems by blocking the flow of blood to the heart, lung, or brain. Coumadin can prevent harmful blood clots from forming. As a reminder your indication for Coumadin is:   Stroke Prevention Because Of Atrial Fibrillation  What test will check on my response to Coumadin? While on Coumadin (warfarin) you will need to have an INR test regularly to ensure that your dose is keeping you in the desired range. The INR (international normalized ratio) number is calculated from the result of the  laboratory test called prothrombin time (PT).  If an INR APPOINTMENT HAS NOT ALREADY BEEN MADE FOR YOU please schedule an appointment to have this lab work done by your health care provider within 7 days. Your INR goal is usually a number between:  2 to 3 or your provider may give you a more narrow range like 2-2.5.  Ask your health care provider during an office visit what your goal INR is.  What  do you need to  know  About  COUMADIN? Take Coumadin (warfarin) exactly as prescribed by your healthcare provider about the same time each day.  DO NOT stop taking without talking to the doctor who prescribed the medication.  Stopping without other blood clot prevention medication to take the place of Coumadin may increase your risk of developing a new clot or stroke.  Get refills before you run out.  What  do you do if you miss a dose? If you miss a dose, take it as soon as you remember on the same day then continue your regularly scheduled regimen the next day.  Do not take two doses of Coumadin at the same time.  Important Safety Information A possible side effect of Coumadin (Warfarin) is an increased risk of bleeding. You should call your healthcare provider right away if you experience any of the following: ? Bleeding from an injury or your nose that does not stop. ? Unusual colored urine (red or dark brown) or unusual colored stools (red or black). ? Unusual bruising for unknown reasons. ? A serious fall or if you hit your head (even if there is no bleeding).  Some foods or medicines interact with Coumadin (warfarin) and might alter your response to warfarin. To help avoid this: ? Eat a balanced diet, maintaining a consistent amount of Vitamin K. ? Notify your provider about major diet changes you plan to make. ? Avoid alcohol or limit your intake to 1 drink for women and 2 drinks for men per day. (1 drink is 5 oz. wine, 12 oz. beer, or 1.5 oz. liquor.)  Make sure that ANY health care provider who prescribes medication for you knows that you are taking Coumadin (warfarin).  Also make sure the healthcare provider who is monitoring your Coumadin knows when you have started a new medication including herbals and non-prescription products.  Coumadin (Warfarin)  Major Drug Interactions  Increased Warfarin Effect Decreased Warfarin Effect  Alcohol (large quantities) Antibiotics (esp. Septra/Bactrim, Flagyl, Cipro) Amiodarone (Cordarone) Aspirin (ASA) Cimetidine (Tagamet) Megestrol (Megace) NSAIDs (ibuprofen, naproxen, etc.) Piroxicam (Feldene) Propafenone (Rythmol SR) Propranolol (Inderal) Isoniazid (INH) Posaconazole (Noxafil) Barbiturates (Phenobarbital) Carbamazepine (Tegretol) Chlordiazepoxide (Librium) Cholestyramine (Questran) Griseofulvin Oral  Contraceptives Rifampin Sucralfate (Carafate) Vitamin K   Coumadin (Warfarin) Major Herbal Interactions  Increased Warfarin Effect Decreased Warfarin Effect  Garlic Ginseng Ginkgo biloba Coenzyme Q10 Green tea St. Johns wort    Coumadin (Warfarin) FOOD Interactions  Eat a consistent number of servings per week of foods HIGH in Vitamin K (1 serving =  cup)  Collards (cooked, or boiled & drained) Kale (cooked, or boiled & drained) Mustard greens (cooked, or boiled & drained) Parsley *serving size only =  cup Spinach (cooked, or boiled & drained) Swiss chard (cooked, or boiled & drained) Turnip greens (cooked, or boiled & drained)  Eat a consistent number of servings per week of foods MEDIUM-HIGH in Vitamin K (1 serving = 1 cup)  Asparagus (cooked, or boiled & drained) Broccoli (cooked, boiled & drained, or raw & chopped) Maxcine Ham  sprouts (cooked, or boiled & drained) *serving size only =  cup Lettuce, raw (green leaf, endive, romaine) Spinach, raw Turnip greens, raw & chopped   These websites have more information on Coumadin (warfarin):  http://www.king-russell.com/www.coumadin.com; https://www.hines.net/www.ahrq.gov/consumer/coumadin.htm;

## 2015-12-01 NOTE — Progress Notes (Signed)
ICD Criteria  Current LVEF:68%. Within 12 months prior to implant: Yes   Heart failure history: No  Cardiomyopathy history: No.  Atrial Fibrillation/Atrial Flutter: No.  Ventricular tachycardia history: No  Cardiac arrest history: Yes, Ventricular Fibrillation.  History of syndromes with risk of sudden death: No.  Previous ICD: No.  Current ICD indication: Secondary  PPM indication: No.   Class I or II Bradycardia indication present: No  Beta Blocker therapy for 3 or more months: No, patient reason.  Ace Inhibitor/ARB therapy for 3 or more months: No, patient reason.

## 2015-12-01 NOTE — Progress Notes (Signed)
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 Heparin     Thrombocytopenia. 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  Assessment: 32 yoM admitted s/p VF arrest and CPR, previously on heparin for rule out ACS then rule out PE with decrease in platelets. 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 Also with new PAF. Had ICD implant today. Pharmacy consulted to begin warfarin without resuming argatroban.  INR subtherapeutic at 1.22 on 9/6. No bleeding noted, Hgb stable, platelets trended up to 157. Newly on amiodarone which can increase warfarin sensitivity.  Goal of Therapy:  INR 2-3 Monitor platelets by anticoagulation protocol: Yes   Plan:  - Warfarin 7.5 mg PO tonight - Daily INR, watch closely with amiodarone - Warfarin book/video - Monitor for s/sx of bleeding   Loura Back, PharmD, BCPS Clinical Pharmacist Phone for today [phone] Main pharmacy - [phone] 12/01/2015 3:18 PM

## 2015-12-01 NOTE — Progress Notes (Signed)
Physical Therapy Treatment Patient Details Name: Arthur Chase MRN: [id number] DOB: 10/29/1955 Today's Date: 12/01/2015    History of Present Illness 60 y.o.malewith no significant past medical history. He doesn't remember any heart racing, shortness of breath, or chest pain preceding syncope. His childrenheard him breathing heavily from another room and found himunresponsive, started CPR, and on EMS arrival, he was in VF and received AED shock. Cardioverted in ED. Urgent cardiac catheterization with no obstructive CAD. He underwent cooling and has been rewarmed with neurologic recovery. Extubated 9/4. Awaiting ICD placement, however +HITT and waiting platelet recovery.    PT Comments    Pt with excellent improvement in mobility but continued need for cues and education with mobility to maintain oxygen saturation with movement. On RA at rest pt sats 92 and able to maintain with energy conservation techniques. With talking and walking at normal speed pt drops to 87% on RA. Pt educated for restrictions and LUE limitations after ICD placement and able to perform bed mobility, basic transfers and gait without use of LUE. Will continue to follow  HR 94 maintained sats 87-94% on RA , 94% on RA with use of IS, returned to 1L end of session  Follow Up Recommendations  Home health PT     Equipment Recommendations  None recommended by PT    Recommendations for Other Services       Precautions / Restrictions Precautions Precaution Comments: watch O2 Restrictions Weight Bearing Restrictions: No    Mobility  Bed Mobility Overal bed mobility: Modified Independent             General bed mobility comments: pt educated for rolling right and pushing up with right arm to prepare for lack of LUE use after ICD  Transfers Overall transfer level: Modified independent                  Ambulation/Gait Ambulation/Gait assistance: Supervision Ambulation Distance (Feet): 450  Feet Assistive device: None Gait Pattern/deviations: WFL(Within Functional Limits)   Gait velocity interpretation: Below normal speed for age/gender General Gait Details: steady gait with cues for energy conservation and pursed lip breathing. Sats dropping to 87% on RA with gait and talking but able to maintain 92% with slowed gait and proper breathing   Stairs            Wheelchair Mobility    Modified Rankin (Stroke Patients Only)       Balance Overall balance assessment: No apparent balance deficits (not formally assessed)                                  Cognition Arousal/Alertness: Awake/alert Behavior During Therapy: WFL for tasks assessed/performed Overall Cognitive Status: Within Functional Limits for tasks assessed                      Exercises      General Comments        Pertinent Vitals/Pain Pain Assessment: No/denies pain    Home Living                      Prior Function            PT Goals (current goals can now be found in the care plan section) Progress towards PT goals: Progressing toward goals    Frequency       PT Plan Current plan remains appropriate  Co-evaluation             End of Session   Activity Tolerance: Patient tolerated treatment well Patient left: in bed;with call bell/phone within reach;with family/visitor present     Time: 6195-0932 PT Time Calculation (min) (ACUTE ONLY): 18 min  Charges:  $Gait Training: 8-22 mins                    G Codes:      Delorse Lek 2015-12-18, 9:39 AM Delaney Meigs, PT [phone]

## 2015-12-01 NOTE — Care Management Note (Signed)
Case Management Note Donn Pierini RN, BSN Unit 2W-Case Manager [phone]  Patient Details  Name: Arthur Chase MRN: [id number] Date of Birth: 1955/08/20  Subjective/Objective:   Pt admitted s/p cardiac arrest - tx from 2H to 2W on 11/27/15                Action/Plan: PTA pt lived at home with wife, anticipate return home with [facility] services- pt will need ICD placement prior to discharge. - CM to follow for d/c needs  Expected Discharge Date:                  Expected Discharge Plan:  Home w Home Health Services  In-House Referral:     Discharge planning Services  CM Consult  Post Acute Care Choice:    Choice offered to:     DME Arranged:    DME Agency:     HH Arranged:    HH Agency:     Status of Service:  In process, will continue to follow  If discussed at Long Length of Stay Meetings, dates discussed:    Additional Comments:  Darrold Span, RN 12/01/2015, 11:38 AM

## 2015-12-01 NOTE — Progress Notes (Signed)
SLP Cancellation Note  Patient Details Name: Arthur Chase MRN: [id number] DOB: 29-Aug-1955   Cancelled treatment:       Reason Eval/Treat Not Completed: Medical issues which prohibited therapy (NPO for procedure. Will f/u 9/12. )  Ferdinand Lango MA, CCC-SLP [phone]  [name] 12/01/2015, 11:12 AM

## 2015-12-02 ENCOUNTER — Inpatient Hospital Stay (HOSPITAL_COMMUNITY): Payer: Managed Care, Other (non HMO)

## 2015-12-02 ENCOUNTER — Encounter (HOSPITAL_COMMUNITY): Payer: Self-pay | Admitting: Cardiology

## 2015-12-02 LAB — PROTIME-INR
INR: 1.18
Prothrombin Time: 15.1 seconds (ref 11.4–15.2)

## 2015-12-02 MED ORDER — WARFARIN SODIUM 7.5 MG PO TABS
7.5000 mg | ORAL_TABLET | Freq: Once | ORAL | Status: AC
  Administered 2015-12-02: 7.5 mg via ORAL
  Filled 2015-12-02: qty 1

## 2015-12-02 NOTE — Progress Notes (Signed)
Physical Therapy Treatment Patient Details Name: Arthur Chase MRN: [id number] DOB: Jun 27, 1955 Today's Date: 12/02/2015    History of Present Illness 60 y.o.malewith no significant past medical history. He doesn't remember any heart racing, shortness of breath, or chest pain preceding syncope. His childrenheard him breathing heavily from another room and found himunresponsive, started CPR, and on EMS arrival, he was in VF and received AED shock. Cardioverted in ED. Extubated 9/4. 9/11 ICD placement    PT Comments    Pt educated for LUE restrictions s/p ICD placement. Pt able to maintain with mobility with cueing only needed once during session. Pt continues to have difficulty maintaining oxygen saturations on RA with activity. Pt with sats 94% on RA at rest and when talking and walking or quick gait sats drop to 86% with gait speed of 1.85ft/sec pt able to maintain 90-91% on RA with pursed lip breathing. Encouraged pulse ox for home function as well as implementation of energy conservation to maintain appropriate oxygenation. Will continue to follow.   HR 84  Follow Up Recommendations  No PT follow up     Equipment Recommendations       Recommendations for Other Services       Precautions / Restrictions Precautions Precautions: ICD/Pacemaker Precaution Comments: watch O2 Restrictions Weight Bearing Restrictions: No    Mobility  Bed Mobility Overal bed mobility: Modified Independent                Transfers Overall transfer level: Modified independent                  Ambulation/Gait Ambulation/Gait assistance: Modified independent (Device/Increase time) Ambulation Distance (Feet): 500 Feet Assistive device: None Gait Pattern/deviations: WFL(Within Functional Limits)   Gait velocity interpretation: Below normal speed for age/gender General Gait Details: cues for energy conservation and pursed lip breathing.    Stairs Stairs: Yes Stairs  assistance: Modified independent (Device/Increase time) Stair Management: Alternating pattern;Forwards Number of Stairs: 3    Wheelchair Mobility    Modified Rankin (Stroke Patients Only)       Balance                                    Cognition Arousal/Alertness: Awake/alert Behavior During Therapy: WFL for tasks assessed/performed Overall Cognitive Status: Within Functional Limits for tasks assessed                      Exercises      General Comments        Pertinent Vitals/Pain Pain Assessment: No/denies pain    Home Living                      Prior Function            PT Goals (current goals can now be found in the care plan section) Progress towards PT goals: Progressing toward goals    Frequency       PT Plan Discharge plan needs to be updated    Co-evaluation             End of Session   Activity Tolerance: Patient tolerated treatment well Patient left: Other (comment) (in bathroom with call light)     Time: 6256-3893 PT Time Calculation (min) (ACUTE ONLY): 23 min  Charges:  $Gait Training: 8-22 mins  G CodesDelorse Lek:      Tabor, Mase Dhondt Beth 12/02/2015, 11:51 AM Delaney MeigsMaija Tabor Desmond Szabo, PT [phone]

## 2015-12-02 NOTE — Progress Notes (Signed)
Speech Language Pathology Treatment: Dysphagia  Patient Details Name: Arthur Chase MRN: [id number] DOB: June 11, 1955 Today's Date: 12/02/2015 Time: 3976-7341 SLP Time Calculation (min) (ACUTE ONLY): 10 min  Assessment / Plan / Recommendation Clinical Impression  Pt has been tolerating a modified diet (dys 2, thin liquids) - trials of regular solids consumed without deficit today/  Pt has been taking pills whole with water.  Lungs clear; no concerns for aspiration. Recommend advancing diet to regular consistency.  No SLP f/u warranted - goals met/will sign off.    HPI HPI: Pt is 60 y.o male with obesity and hypertension who was admitted due to cardiac arrest. Pt was intubated for 8 days and was extubated on 9/4.      SLP Plan  All goals met     Recommendations  Diet recommendations: Regular;Thin liquid Liquids provided via: Cup;Straw Medication Administration: Whole meds with liquid Supervision: Patient able to self feed             Oral Care Recommendations: Oral care BID Plan: All goals met     GO                Arthur Chase 12/02/2015, 10:50 AM

## 2015-12-02 NOTE — Progress Notes (Signed)
Nutrition Follow-up  DOCUMENTATION CODES:   Obesity unspecified  INTERVENTION:    Continue Heart Healthy diet  NUTRITION DIAGNOSIS:   Inadequate oral intake related to poor appetite as evidenced by other (see comment) (diet recently advanced from NPO to Dysphagia 2), resolved  GOAL:   Patient will meet greater than or equal to 90% of their needs, met  MONITOR:   PO intake, Diet advancement, I & O's  ASSESSMENT:   60 y.o. Male with obesity, HTN, brought to the [facility] ED on 8/27 after a cardiac arrest at home. Total time of CPR estimated to be 16 minutes. Found to be in Vfib on 3 separate occasions, each successfully defibrillated.   Pt extubated 9/4. Diet advanced to Dys 2-thin liquids per Speech Path; now on regular, thin consistency diet. PO intake 100% per flowsheet records. Probable discharge tomorrow.  Diet Order:  Diet Heart Room service appropriate? Yes; Fluid consistency: Thin  Skin:  Reviewed, no issues  Last BM:  9/11  Height:   Ht Readings from Last 1 Encounters:  11/29/15 '5\' 10"'  (1.778 m)   Weight:   Wt Readings from Last 1 Encounters:  12/02/15 216 lb 3.2 oz (98.1 kg)   Ideal Body Weight:  75.45 kg  BMI:  Body mass index is 31.02 kg/m.  Estimated Nutritional Needs:   Kcal:  1700-1900  Protein:  95-105 grams  Fluid:  >/= 1.7 L/day  EDUCATION NEEDS:   No education needs identified at this time  Arthur Holms, RD, LDN Pager #: [phone] After-Hours Pager #: [phone]

## 2015-12-02 NOTE — Progress Notes (Addendum)
       Patient Name: Arthur Chase Date of Encounter: 12/02/2015    SUBJECTIVE: He had AICD implanted yesterday. No procedural or postprocedure complications. He is ambulating. INR is subtherapeutic. Plan is to allow him to slowly anticoagulate with close follow-up.  TELEMETRY:  No ventricular arrhythmias of significance Vitals:   12/01/15 2055 12/02/15 0230 12/02/15 0642 12/02/15 1149  BP: 124/79  134/77   Pulse: 78  82   Resp: 18  18   Temp: 98.8 F (37.1 C)  98.3 F (36.8 C)   TempSrc: Oral  Oral   SpO2: 94%  95% 90%  Weight:  216 lb 3.2 oz (98.1 kg)    Height:        Intake/Output Summary (Last 24 hours) at 12/02/15 1204 Last data filed at 12/01/15 2054  Gross per 24 hour  Intake              240 ml  Output                0 ml  Net              240 ml   LABS: Basic Metabolic Panel:  Recent Labs  50/38/88 0336  NA 139  K 3.8  CL 110  CO2 23  GLUCOSE 103*  BUN 13  CREATININE 0.83  CALCIUM 8.8*   CBC:  Recent Labs  11/30/15 0336  WBC 10.8*  HGB 11.7*  HCT 37.3*  MCV 98.9  PLT 157   INR 1.18 with goal of 2.0.  Radiology/Studies:  Chest x-ray:  IMPRESSION: 1. No acute cardiopulmonary disease. No evidence of a complication following pacemaker placement. No pneumothorax.  Physical Exam: Blood pressure 134/77, pulse 82, temperature 98.3 F (36.8 C), temperature source Oral, resp. rate 18, height 5\' 10"  (1.778 m), weight 216 lb 3.2 oz (98.1 kg), SpO2 90 %. Weight change: -1 lb 9.6 oz (-0.726 kg)  Wt Readings from Last 3 Encounters:  12/02/15 216 lb 3.2 oz (98.1 kg)    Chest is clear No pericardial friction rub, gallop, or murmur. No JVD There is no peripheral edema Disconjugate gaze on neuro exam.   ASSESSMENT:  1. VF cardiac arrest, not ischemically mediated. Successful resuscitation after 15 minutes. 2. Transient anoxic encephalopathy, now with significant improvement. 3. Acute systolic heart failure resolved to normal systolic  function post arrest. MRI demonstrates normal LV function and no evidence of scarring. 4. Transient atrial fibrillation during the acute illness, hence recommendation for anticoagulation. Over time if device telemetry does not demonstrate recurrent A. fib, anticoagulation could potentially be discontinued. 5. Heparin-induced thrombocytopenia, resolved. 6. Anticoagulation with Coumadin with current INR 1.18. Plan is to slowly achieve INR of 2.   Plan:  1. Probable discharge tomorrow. 2. Case manager to assess for home needs. 3. PT assessment to determine if there are any functional needs prior to DC 4. Patient's plan is to return home. 5. Amiodarone therapy, ARB's therapy, low-dose beta blocker therapy, Aldactone, and warfarin will be continued at discharge. 6. Check basic metabolic panel in a.m. to determine if potassium supplementation is needed.  Signed, Lyn Records III 12/02/2015, 12:04 PM

## 2015-12-02 NOTE — Progress Notes (Signed)
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 Heparin     Thrombocytopenia. 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  Assessment: 53 yoM admitted s/p VF arrest and CPR, previously on heparin for rule out ACS then rule out PE with decrease in platelets. 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 Also with new PAF. Was on Argatroban 9/6 thru 9/10.  ICD implant pn 12/01/15.  Pharmacy consulted for warfarin dosing without resuming argatroban.    INR is 1.18 after Coumadin 7.5 mg x 1 last night.   Newly on amiodarone which can increase warfarin sensitivity.  Goal of Therapy:  INR 2-3 Monitor platelets by anticoagulation protocol: Yes   Plan:    Warfarin 7.5 mg x 1 again tonight; expect to decrease dose tomorrow.   Daily PT/ INR, watch closely with amiodarone   CBC in am.   Monitor for s/sx of bleeding  Dennie Fetters, [place] Pager: [phone] 12/02/2015 3:09 PM

## 2015-12-02 NOTE — Progress Notes (Signed)
Patient ID: Arthur Chase, male   DOB: 1955-11-25, 60 y.o.   MRN: [id number]    Patient Name: Arthur Chase Date of Encounter: 12/02/2015     Principal Problem:   Cardiac arrest [facility]) Active Problems:   Acute respiratory failure with hypoxemia (HCC)   Acute encephalopathy   Cardiogenic shock (HCC)   Anoxic brain injury (HCC)   Atrial fibrillation with rapid ventricular response (HCC)   Acute systolic CHF (congestive heart failure) (HCC)   Hypokalemia   NSVT (nonsustained ventricular tachycardia) (HCC)   VF (ventricular fibrillation) (HCC)   Heparin induced thrombocytopenia (HCC)    SUBJECTIVE  Minimal incisional tenderness.  CURRENT MEDS . amiodarone  400 mg Oral Daily  . losartan  25 mg Oral Daily  . metoprolol succinate  12.5 mg Oral Daily  . multivitamin with minerals  1 tablet Oral Daily  . mupirocin ointment  1 application Nasal BID  . pantoprazole  40 mg Oral Daily  . potassium chloride  20 mEq Oral BID  . spironolactone  12.5 mg Oral Daily  . tobramycin  1 drop Left Eye Q6H  . Warfarin - Pharmacist Dosing Inpatient   Does not apply q1800    OBJECTIVE  Vitals:   12/01/15 1500 12/01/15 2055 12/02/15 0230 12/02/15 0642  BP: 131/77 124/79  134/77  Pulse: 89 78  82  Resp: 18 18  18   Temp: 98.3 F (36.8 C) 98.8 F (37.1 C)  98.3 F (36.8 C)  TempSrc: Oral Oral  Oral  SpO2: 94% 94%  95%  Weight:   216 lb 3.2 oz (98.1 kg)   Height:        Intake/Output Summary (Last 24 hours) at 12/02/15 0909 Last data filed at 12/01/15 2054  Gross per 24 hour  Intake              240 ml  Output                0 ml  Net              240 ml   Filed Weights   11/30/15 0446 12/01/15 0525 12/02/15 0230  Weight: 219 lb 12.8 oz (99.7 kg) 217 lb 12.8 oz (98.8 kg) 216 lb 3.2 oz (98.1 kg)    PHYSICAL EXAM  General: Pleasant, NAD. Neuro: Alert and oriented X 3. Moves all extremities spontaneously. Psych: Normal affect. HEENT:  Normal  Neck: Supple without bruits or  JVD. Lungs:  Resp regular and unlabored, CTA. No hematoma Heart: RRR no s3, s4, or murmurs. Abdomen: Soft, non-tender, non-distended, BS + x 4.  Extremities: No clubbing, cyanosis or edema. DP/PT/Radials 2+ and equal bilaterally.  Accessory Clinical Findings  CBC  Recent Labs  11/30/15 0336  WBC 10.8*  HGB 11.7*  HCT 37.3*  MCV 98.9  PLT 157   Basic Metabolic Panel  Recent Labs  11/30/15 0336  NA 139  K 3.8  CL 110  CO2 23  GLUCOSE 103*  BUN 13  CREATININE 0.83  CALCIUM 8.8*   Liver Function Tests No results for input(s): AST, ALT, ALKPHOS, BILITOT, PROT, ALBUMIN in the last 72 hours. No results for input(s): LIPASE, AMYLASE in the last 72 hours. Cardiac Enzymes No results for input(s): CKTOTAL, CKMB, CKMBINDEX, TROPONINI in the last 72 hours. BNP Invalid input(s): POCBNP D-Dimer No results for input(s): DDIMER in the last 72 hours. Hemoglobin A1C No results for input(s): HGBA1C in the last 72 hours. Fasting Lipid Panel No results for  input(s): CHOL, HDL, LDLCALC, TRIG, CHOLHDL, LDLDIRECT in the last 72 hours. Thyroid Function Tests No results for input(s): TSH, T4TOTAL, T3FREE, THYROIDAB in the last 72 hours.  Invalid input(s): FREET3  TELE  nsr  Radiology/Studies  Dg Chest 2 View  Result Date: 12/02/2015 CLINICAL DATA:  Pt had a defibrillator placed yesterdayNo chest complaints this morning Hx of atrial fibrillation EXAM: CHEST  2 VIEW COMPARISON:  11/24/2015 FINDINGS: Opacity projects laterally from the inferior aspect of the right hilum, and is also noted in the medial right lung base, most consistent with atelectasis. There is mild diffuse prominence of the interstitium. Lungs otherwise clear. Minimal pleural fluid blunts the posterior costophrenic sulci. No overt pulmonary edema. No pneumothorax. Cardiac silhouette is normal in size. No mediastinal or hilar masses. Left anterior chest wall single lead pacemaker is well positioned. IMPRESSION: 1. No  acute cardiopulmonary disease. No evidence of a complication following pacemaker placement. No pneumothorax. Electronically Signed   By: Amie Portland M.D.   On: 12/02/2015 07:54   Ct Head Wo Contrast  Result Date: 11/22/2015 CLINICAL DATA:  Concern regarding anoxic encephalopathy. Respiratory failure and heart failure. EXAM: CT HEAD WITHOUT CONTRAST TECHNIQUE: Contiguous axial images were obtained from the base of the skull through the vertex without intravenous contrast. COMPARISON:  11/17/2015 FINDINGS: Brain: The brain demonstrates stable CT appearance with no evidence of hemorrhage, infarction, edema, mass effect, extra-axial fluid collection, hydrocephalus or mass lesion. Vascular: No hyperdense vessel or unexpected calcification. Skull: The skull is unremarkable. Sinuses/Orbits: Mucosal thickening within both maxillary antra, left greater than right. Additional mucosal thickening in bilateral ethmoid and sphenoid air cells. IMPRESSION: Stable head CT demonstrating no acute findings or evidence of visible infarcts. Mucosal sinus thickening. Electronically Signed   By: Irish Lack M.D.   On: 11/22/2015 16:03   Ct Head Wo Contrast  Result Date: 11/17/2015 CLINICAL DATA:  Acute encephalopathy. Cardiac arrest with clean left heart catheterization today. EXAM: CT HEAD WITHOUT CONTRAST TECHNIQUE: Contiguous axial images were obtained from the base of the skull through the vertex without intravenous contrast. COMPARISON:  None. FINDINGS: Brain: Mild cerebral atrophy. No ventricular dilatation. No mass effect or midline shift. No abnormal extra-axial fluid collections. Gray-white matter junctions are distinct. Basal cisterns are not effaced. No evidence of acute intracranial hemorrhage. Vascular: No hyperdense vessel or unexpected calcification. Skull: No depressed skull fractures. Sinuses/Orbits: Diffuse opacification of a ethmoid air cells and sphenoid sinuses with air-fluid levels in the maxillary antra  and mucosal thickening in the frontal sinuses. Mastoid air cells are not opacified. Other: None. IMPRESSION: No acute intracranial abnormalities. Fluid in the paranasal sinuses. Electronically Signed   By: Burman Nieves M.D.   On: 11/17/2015 01:47   Ct Angio Chest Pe W Or Wo Contrast  Result Date: 11/22/2015 CLINICAL DATA:  Cardiac arrest and respiratory failure. Aspiration pneumonia. EXAM: CT ANGIOGRAPHY CHEST WITH CONTRAST TECHNIQUE: Multidetector CT imaging of the chest was performed using the standard protocol during bolus administration of intravenous contrast. Multiplanar CT image reconstructions and MIPs were obtained to evaluate the vascular anatomy. CONTRAST:  80 mL Isovue 370 IV COMPARISON:  Chest x-ray earlier today. FINDINGS: Cardiovascular: Pulmonary arteries are well opacified. There is no evidence of pulmonary embolism. The thoracic aorta is of normal caliber. There is separate origin of the left vertebral artery off of the aortic arch. The heart is mildly enlarged. No pericardial effusion identified. There is some calcified plaque in the LAD. Mediastinum/Nodes: No evidence of mediastinal masses or lymphadenopathy. Some  small mediastinal lymph nodes present. No evidence of hilar lymphadenopathy. Lungs/Pleura: Ground-glass opacity and alveolar airspace disease in the upper lobes bilaterally and extending into the lingula. This likely represents alveolar edema. There is dense consolidation and atelectasis of both lower lobes in a fairly symmetric pattern. This most likely represents atelectasis but there may also be a component of aspiration pneumonia. No associated pleural effusion. No pneumothorax. Endotracheal tube present with the tip above the carina. Upper Abdomen: Nasogastric tube enters the stomach. Musculoskeletal: No significant bony abnormalities. Review of the MIP images confirms the above findings. IMPRESSION: 1. No evidence of pulmonary embolism. 2. Calcified plaque seen in the  distribution of the LAD. 3. Pulmonary edema with alveolar opacities in both upper lobes. Dense airspace consolidation and volume loss in the lower lobes bilaterally. This most likely represents atelectasis. Component of aspiration pneumonia cannot be excluded. Electronically Signed   By: Irish LackGlenn  Yamagata M.D.   On: 11/22/2015 16:08   Dg Chest Port 1 View  Result Date: 11/24/2015 CLINICAL DATA:  Respiratory failure. EXAM: PORTABLE CHEST 1 VIEW COMPARISON:  11/23/2015 FINDINGS: Endotracheal tube remains in place with tip just a below the level of the clavicular heads, well above the carina. Left jugular central venous catheter is unchanged and terminates over the upper SVC. Enteric tube courses towards the upper abdomen with tip not imaged. Cardiac silhouette is upper limits of normal to mildly enlarged in size, unchanged. Lung volumes remain diminished with mildly increased patchy opacity in the left lung base. No sizable pleural effusion or pneumothorax is identified. IMPRESSION: Low lung volumes with mildly increased left basilar opacity, likely atelectasis. Electronically Signed   By: Sebastian AcheAllen  Grady M.D.   On: 11/24/2015 08:13   Dg Chest Port 1 View  Result Date: 11/23/2015 CLINICAL DATA:  New onset AFib status post cardiac arrest and hypothermia protocol EXAM: PORTABLE CHEST 1 VIEW COMPARISON:  CTA chest dated 11/22/2015 FINDINGS: Endotracheal tube terminates 2.5 cm above the carina. Low lung volumes. No focal consolidation. No pleural effusion or pneumothorax. Heart is top-normal in size. Left IJ venous catheter terminates in the mid SVC. IMPRESSION: Endotracheal tube terminates 2.5 cm above the carina. Low lung volumes. Electronically Signed   By: Charline BillsSriyesh  Krishnan M.D.   On: 11/23/2015 07:44   Dg Chest Port 1 View  Result Date: 11/22/2015 CLINICAL DATA:  Respiratory failure. EXAM: PORTABLE CHEST 1 VIEW COMPARISON:  11/21/2015 FINDINGS: Endotracheal tube remains with the tip approximately 3 cm above the  carina. Stable positioning of left jugular central line with the tip at the origin of the SVC. Slight worsening of atelectasis in the left perihilar region. Stable elevation of the right hemidiaphragm. No overt edema, pleural effusions or pneumothorax identified. IMPRESSION: Slight increase in left perihilar atelectasis. Electronically Signed   By: Irish LackGlenn  Yamagata M.D.   On: 11/22/2015 08:13   Dg Chest Port 1 View  Result Date: 11/21/2015 CLINICAL DATA:  Intubation. EXAM: PORTABLE CHEST 1 VIEW COMPARISON:  11/20/2015. FINDINGS: Interim removal Swan-Ganz catheter. Endotracheal tube, NG tube, left IJ line and stable position. Stable cardiomegaly. Left lower lobe infiltrate. No pleural effusion or pneumothorax. Prior cervical spine fusion. IMPRESSION: 1. Interim removal of Swan-Ganz catheter. Remaining lines and tubes in stable position. 2.  Stable cardiomegaly with mild pulmonary venous congestion. 3. Improving bibasilar atelectasis. Persistent infiltrate left lower lobe. Electronically Signed   By: Maisie Fushomas  Register   On: 11/21/2015 06:47   Dg Chest Port 1 View  Result Date: 11/20/2015 CLINICAL DATA:  Cardiac arrest.  EXAM: PORTABLE CHEST 1 VIEW COMPARISON:  11/19/2015 . FINDINGS: Endotracheal tube, NG tube, Swan-Ganz catheter in stable position. Cardiomegaly with mild pulmonary venous congestion noted. Low lung volumes with mild bibasilar atelectasis and/or infiltrates. No pleural effusion or pneumothorax .Prior cervical spine fusion. IMPRESSION: 1.  Lines and tubes in stable position. 2. Cardiomegaly with mild pulmonary venous congestion. 3. Low lung volumes with mild bibasilar atelectasis and/or infiltrates. Electronically Signed   By: Maisie Fus  Register   On: 11/20/2015 07:09   Dg Chest Port 1 View  Result Date: 11/19/2015 CLINICAL DATA:  Shortness of breath. EXAM: PORTABLE CHEST 1 VIEW COMPARISON:  11/18/2015. FINDINGS: Endotracheal tube, Swan-Ganz catheter, NG tube, left IJ line in stable position.  Stable cardiomegaly. Unchanged bilateral pulmonary infiltrates and or edema, left side greater right. No pleural effusion or pneumothorax. Prior cervical spine fusion. IMPRESSION: 1. Lines and tubes in stable position. 2. Unchanged bilateral pulmonary infiltrates and or edema, left side greater right. 3. Stable cardiomegaly. Electronically Signed   By: Maisie Fus  Register   On: 11/19/2015 06:57   Dg Chest Port 1 View  Result Date: 11/18/2015 CLINICAL DATA:  Central line placement. EXAM: PORTABLE CHEST 1 VIEW COMPARISON:  11/17/2015. FINDINGS: Endotracheal tube, NG tube, Swan-Ganz catheter, left IJ line stable position. Stable cardiomegaly. Unchanged bilateral infiltrates and/or pneumonia. No pleural effusion or pneumothorax. Prior cervical spine fusion. IMPRESSION: 1.  Lines and tubes in stable position. 2. Unchanged bilateral pulmonary infiltrates and/or edema, left side greater than right. 3. Stable cardiomegaly. Electronically Signed   By: Maisie Fus  Register   On: 11/18/2015 06:55   Dg Chest Port 1 View  Result Date: 11/17/2015 CLINICAL DATA:  Swan-Ganz catheter placement. EXAM: PORTABLE CHEST 1 VIEW COMPARISON:  Yesterday. FINDINGS: Interval right jugular Swan-Ganz catheter with its tip in the intersegmental pulmonary artery on the right. No pneumothorax. Stable left jugular catheter. Endotracheal tube in satisfactory position. Nasogastric tube tip and side hole in the proximal stomach. Bilateral airspace opacity with no significant overall change. Normal sized heart. No pleural fluid seen. Cervical spine fixation hardware. IMPRESSION: 1. Right jugular catheter tip in the intersegmental pulmonary artery on the right without pneumothorax. 2. No significant change in probable bilateral pneumonia. Electronically Signed   By: Beckie Salts M.D.   On: 11/17/2015 17:09   Dg Chest Port 1 View  Result Date: 11/17/2015 CLINICAL DATA:  Respiratory failure.  Central line placement. EXAM: PORTABLE CHEST 1 VIEW  COMPARISON:  11/16/2015 at at 21:22 FINDINGS: There is a new left jugular central line with tip in the SVC at the azygos vein junction. No significant pneumothorax. Endotracheal tube remains satisfactorily positioned with its tip approximately 4.5 cm above the carina. The enteric tube extends into the stomach and beyond the inferior edge of the image. Airspace opacities persist bilaterally without significant interval change. IMPRESSION: 1. New left jugular central line appears satisfactorily positioned. No significant pneumothorax. 2. Continued satisfactory positions of the ET tube and NG tube. 3. Persistent airspace opacities bilaterally without significant interval change. Electronically Signed   By: Ellery Plunk M.D.   On: 11/17/2015 01:19   Dg Chest Portable 1 View  Result Date: 11/16/2015 CLINICAL DATA:  Respiratory failure.  Intubation. EXAM: PORTABLE CHEST 1 VIEW COMPARISON:  None. FINDINGS: The endotracheal tube is 3.3 cm above the carina. The enteric tube extends at least to the distal esophagus, continuing B on the inferior edge of the image. Diffuse airspace opacities are present bilaterally, left worse than right. No large pneumothorax or large effusion evident  on this supine portable radiograph. IMPRESSION: 1. Satisfactorily positioned ETT. Enteric tube extends beyond the inferior edge of the image but reaches at least as far as the distal esophagus. 2. Diffuse airspace opacities bilaterally, left worse than right. Electronically Signed   By: Ellery Plunk M.D.   On: 11/16/2015 21:45   Dg Swallowing Func-speech Pathology  Result Date: 11/25/2015 Objective Swallowing Evaluation: Type of Study: MBS-Modified Barium Swallow Study Patient Details Name: JAMONTAE SEAGROVES MRN: [id number] Date of Birth: November 21, 1955 Today's Date: 11/25/2015 Time: SLP Start Time (ACUTE ONLY): 1253-SLP Stop Time (ACUTE ONLY): 1318 SLP Time Calculation (min) (ACUTE ONLY): 25 min Past Medical History: Past Medical History:  Diagnosis Date . Atrial fibrillation with rapid ventricular response (HCC) 11/20/2015 Past Surgical History: Past Surgical History: Procedure Laterality Date . CARDIAC CATHETERIZATION N/A 11/16/2015  Procedure: Left Heart Cath and Coronary Angiography;  Surgeon: Peter M [place], MD;  Location: [facility] INVASIVE CV LAB;  Service: Cardiovascular;  Laterality: N/A; HPI: Pt is 60 y.o male with obesity and hypertension who was admitted due to cardiac arrest. Pt was intubated for 8 days and was extubated on 9/4. Subjective: Pt was alert and cooperative for swallow evaluation Assessment / Plan / Recommendation CHL IP CLINICAL IMPRESSIONS 11/25/2015 Therapy Diagnosis Mild pharyngeal phase dysphagia Clinical Impression Upon arrival, pt did cough up clear secretions before MBS began. Following the study, pt presents with mild pharyngeal dysphagia. Pt displayed a delay in swallow to the vallecula and reduced epiglottic closure when given thin liquids. While the pt did not aspirate during MBS, pt had silent penetration to the vocal cords during thin liquid and solid trials. It is unclear whether the penetration observed during the  solid trial was due to puree residue in the vallecula or the solid bolus. Pt required cues for throat clearing to clear penetration. Recommend Dys 2 diet and thin liquid with likely diet improvement given time post extubation.  Impact on safety and function Mild aspiration risk   CHL IP TREATMENT RECOMMENDATION 11/25/2015 Treatment Recommendations Therapy as outlined in treatment plan below   Prognosis 11/25/2015 Prognosis for Safe Diet Advancement Good Barriers to Reach Goals Cognitive deficits Barriers/Prognosis Comment -- CHL IP DIET RECOMMENDATION 11/25/2015 SLP Diet Recommendations Dysphagia 2 (Fine chop) solids;Thin liquid Liquid Administration via Cup;Straw Medication Administration Whole meds with puree Compensations Slow rate;Small sips/bites;Minimize environmental distractions;Clear throat intermittently  Postural Changes Remain semi-upright after after feeds/meals (Comment);Seated upright at 90 degrees   CHL IP OTHER RECOMMENDATIONS 11/25/2015 Recommended Consults -- Oral Care Recommendations Oral care BID Other Recommendations Have oral suction available   CHL IP FOLLOW UP RECOMMENDATIONS 11/25/2015 Follow up Recommendations 24 hour supervision/assistance   CHL IP FREQUENCY AND DURATION 11/25/2015 Speech Therapy Frequency (ACUTE ONLY) min 2x/week Treatment Duration 2 weeks      CHL IP ORAL PHASE 11/25/2015 Oral Phase WFL Oral - Pudding Teaspoon -- Oral - Pudding Cup -- Oral - Honey Teaspoon -- Oral - Honey Cup -- Oral - Nectar Teaspoon -- Oral - Nectar Cup -- Oral - Nectar Straw -- Oral - Thin Teaspoon -- Oral - Thin Cup -- Oral - Thin Straw -- Oral - Puree -- Oral - Mech Soft -- Oral - Regular -- Oral - Multi-Consistency -- Oral - Pill -- Oral Phase - Comment --  CHL IP PHARYNGEAL PHASE 11/25/2015 Pharyngeal Phase Impaired Pharyngeal- Pudding Teaspoon -- Pharyngeal -- Pharyngeal- Pudding Cup -- Pharyngeal -- Pharyngeal- Honey Teaspoon -- Pharyngeal -- Pharyngeal- Honey Cup -- Pharyngeal -- Pharyngeal- Nectar Teaspoon -- Pharyngeal --  Pharyngeal- Nectar Cup -- Pharyngeal -- Pharyngeal- Nectar Straw -- Pharyngeal -- Pharyngeal- Thin Teaspoon -- Pharyngeal -- Pharyngeal- Thin Cup Penetration/Aspiration during swallow;Delayed swallow initiation-vallecula;Reduced epiglottic inversion Pharyngeal Material enters airway, CONTACTS cords and not ejected out Pharyngeal- Thin Straw Penetration/Aspiration during swallow;Delayed swallow initiation-vallecula;Reduced epiglottic inversion Pharyngeal Material enters airway, CONTACTS cords and not ejected out Pharyngeal- Puree Pharyngeal residue - valleculae;Penetration/Apiration after swallow Pharyngeal Material enters airway, CONTACTS cords and not ejected out Pharyngeal- Mechanical Soft -- Pharyngeal -- Pharyngeal- Regular Pharyngeal residue - valleculae;Penetration/Aspiration during  swallow Pharyngeal Material enters airway, CONTACTS cords and not ejected out Pharyngeal- Multi-consistency -- Pharyngeal -- Pharyngeal- Pill WFL Pharyngeal -- Pharyngeal Comment --  CHL IP CERVICAL ESOPHAGEAL PHASE 11/25/2015 Cervical Esophageal Phase WFL Pudding Teaspoon -- Pudding Cup -- Honey Teaspoon -- Honey Cup -- Nectar Teaspoon -- Nectar Cup -- Nectar Straw -- Thin Teaspoon -- Thin Cup -- Thin Straw -- Puree -- Mechanical Soft -- Regular -- Multi-consistency -- Pill -- Cervical Esophageal Comment -- No flowsheet data found. Maxcine Ham 11/25/2015, 3:14 PM Note populated for Jearl Klinefelter, student SLP  Maxcine Ham, M.A. CCC-SLP [phone]             Mr Card Morphology Wo/w Cm  Result Date: 11/26/2015 CLINICAL DATA:  Cardiac Arrest EXAM: CARDIAC MRI TECHNIQUE: The patient was scanned on a 1.5 Tesla GE magnet. A dedicated cardiac coil was used. Functional imaging was done using Fiesta sequences. 2,3, and 4 chamber views were done to assess for RWMA's. Modified Simpson's rule using a short axis stack was used to calculate an ejection fraction on a dedicated work [employer]. The patient received 28 cc of Multihance. After 10 minutes inversion recovery sequences were used to assess for infiltration and scar tissue. CONTRAST:  28 cc Multihance FINDINGS: All 4 cardiac chambers were normal in size and function. There was no ASD/VSD or pericardial effusion. The septum was 14 mm with no evidence of HOCM or SAM. The quantitative EF was 68% (EDV 130 cc ESV 41 cc SV 89 cc) Delayed enhancement showed no scar, infiltration or infarct. RV size and function were normal Dysplasia sequences not performed IMPRESSION: 1) Moderate LVH EF 68% no RWMA;s 2) No delayed gadolinium uptake No scar/infiltration of LV myocardium 3) Normal RV size and function Charlton Haws Electronically Signed   By: Charlton Haws M.D.   On: 11/26/2015 10:13    ASSESSMENT AND PLAN  1. VF arrest - he is s/p ICD implant.  He will continue amio 400 mg daily, beta blocker, ARB 2. S/p ICD - usual followup. CXR and interogation looks good.  Gregg Taylor,M.D.  Gregg Taylor,M.D.  12/02/2015 9:09 AM

## 2015-12-03 ENCOUNTER — Encounter (HOSPITAL_COMMUNITY): Payer: Self-pay | Admitting: Physician Assistant

## 2015-12-03 DIAGNOSIS — Z959 Presence of cardiac and vascular implant and graft, unspecified: Secondary | ICD-10-CM

## 2015-12-03 DIAGNOSIS — D75829 Heparin-induced thrombocytopenia, unspecified: Secondary | ICD-10-CM | POA: Diagnosis present

## 2015-12-03 DIAGNOSIS — Z95810 Presence of automatic (implantable) cardiac defibrillator: Secondary | ICD-10-CM | POA: Diagnosis present

## 2015-12-03 DIAGNOSIS — I4901 Ventricular fibrillation: Secondary | ICD-10-CM

## 2015-12-03 DIAGNOSIS — I10 Essential (primary) hypertension: Secondary | ICD-10-CM | POA: Diagnosis present

## 2015-12-03 DIAGNOSIS — I469 Cardiac arrest, cause unspecified: Secondary | ICD-10-CM | POA: Diagnosis present

## 2015-12-03 DIAGNOSIS — D7582 Heparin induced thrombocytopenia (HIT): Secondary | ICD-10-CM | POA: Diagnosis present

## 2015-12-03 DIAGNOSIS — I480 Paroxysmal atrial fibrillation: Secondary | ICD-10-CM | POA: Diagnosis present

## 2015-12-03 LAB — BASIC METABOLIC PANEL
ANION GAP: 9 (ref 5–15)
BUN: 8 mg/dL (ref 6–20)
CO2: 22 mmol/L (ref 22–32)
Calcium: 8.7 mg/dL — ABNORMAL LOW (ref 8.9–10.3)
Chloride: 104 mmol/L (ref 101–111)
Creatinine, Ser: 0.77 mg/dL (ref 0.61–1.24)
GFR calc Af Amer: >60 mL/min (ref >60)
GLUCOSE: 103 mg/dL — AB (ref 65–99)
POTASSIUM: 3.9 mmol/L (ref 3.5–5.1)
Sodium: 135 mmol/L (ref 135–145)

## 2015-12-03 LAB — PROTIME-INR
INR: 1.46
Prothrombin Time: 17.9 seconds — ABNORMAL HIGH (ref 11.4–15.2)

## 2015-12-03 LAB — CBC
HEMATOCRIT: 36 % — AB (ref 39.0–52.0)
HEMOGLOBIN: 11.5 g/dL — AB (ref 13.0–17.0)
MCH: 31.2 pg (ref 26.0–34.0)
MCHC: 31.9 g/dL (ref 30.0–36.0)
MCV: 97.6 fL (ref 78.0–100.0)
Platelets: 183 10*3/uL (ref 150–400)
RBC: 3.69 MIL/uL — ABNORMAL LOW (ref 4.22–5.81)
RDW: 12.7 % (ref 11.5–15.5)
WBC: 9.5 10*3/uL (ref 4.0–10.5)

## 2015-12-03 MED ORDER — WARFARIN SODIUM 5 MG PO TABS: 5.0000 mg | ORAL_TABLET | ORAL | 3 refills | Status: DC

## 2015-12-03 MED ORDER — LOSARTAN POTASSIUM 25 MG PO TABS: 25.0000 mg | ORAL_TABLET | Freq: Every day | ORAL | 6 refills | Status: DC

## 2015-12-03 MED ORDER — PANTOPRAZOLE SODIUM 40 MG PO TBEC: 40.0000 mg | DELAYED_RELEASE_TABLET | Freq: Every day | ORAL | 6 refills | Status: DC

## 2015-12-03 MED ORDER — SPIRONOLACTONE 25 MG PO TABS: 12.5000 mg | ORAL_TABLET | Freq: Every day | ORAL | 6 refills | Status: DC

## 2015-12-03 MED ORDER — METOPROLOL SUCCINATE ER 25 MG PO TB24: 12.5000 mg | ORAL_TABLET | Freq: Every day | ORAL | 6 refills | Status: DC

## 2015-12-03 MED ORDER — POTASSIUM CHLORIDE CRYS ER 20 MEQ PO TBCR: 20.0000 meq | EXTENDED_RELEASE_TABLET | Freq: Two times a day (BID) | ORAL | 6 refills | Status: DC

## 2015-12-03 MED ORDER — AMIODARONE HCL 400 MG PO TABS: 400.0000 mg | ORAL_TABLET | ORAL | 1 refills | Status: DC

## 2015-12-03 NOTE — Progress Notes (Addendum)
Patient Name: Arthur Chase Date of Encounter: 12/03/2015     Principal Problem:   Cardiac arrest [facility](HCC) Active Problems:   Acute respiratory failure with hypoxemia (HCC)   Acute encephalopathy   Cardiogenic shock (HCC)   Anoxic brain injury (HCC)   Atrial fibrillation with rapid ventricular response (HCC)   Acute systolic CHF (congestive heart failure) (HCC)   Hypokalemia   NSVT (nonsustained ventricular tachycardia) (HCC)   VF (ventricular fibrillation) (HCC)   Heparin induced thrombocytopenia (HCC)    SUBJECTIVE  Feeling well. No complaints.    CURRENT MEDS . amiodarone  400 mg Oral Daily  . losartan  25 mg Oral Daily  . metoprolol succinate  12.5 mg Oral Daily  . multivitamin with minerals  1 tablet Oral Daily  . pantoprazole  40 mg Oral Daily  . potassium chloride  20 mEq Oral BID  . spironolactone  12.5 mg Oral Daily  . tobramycin  1 drop Left Eye Q6H  . Warfarin - Pharmacist Dosing Inpatient   Does not apply q1800    OBJECTIVE  Vitals:   12/02/15 1513 12/02/15 2102 12/03/15 0523 12/03/15 0541  BP: 127/73 129/71 120/73   Pulse: 79 78 74   Resp: 18 18 18    Temp: 98.4 F (36.9 C) 99 F (37.2 C) 99.4 F (37.4 C)   TempSrc: Oral Oral Oral   SpO2: 95% 94% 93%   Weight:    213 lb 6.4 oz (96.8 kg)  Height:        Intake/Output Summary (Last 24 hours) at 12/03/15 0816 Last data filed at 12/02/15 1900  Gross per 24 hour  Intake              600 ml  Output                0 ml  Net              600 ml   Filed Weights   12/01/15 0525 12/02/15 0230 12/03/15 0541  Weight: 217 lb 12.8 oz (98.8 kg) 216 lb 3.2 oz (98.1 kg) 213 lb 6.4 oz (96.8 kg)    PHYSICAL EXAM  General: Pleasant, NAD. Neuro: Alert and oriented X 3. Moves all extremities spontaneously. Psych: Normal affect. HEENT:  Normal  Neck: Supple without bruits or JVD. Lungs:  Resp regular and unlabored, CTA. Heart: RRR no s3, s4, or murmurs. Abdomen: Soft, non-tender, non-distended, BS + x  4.  Extremities: No clubbing, cyanosis or edema. DP/PT/Radials 2+ and equal bilaterally.  Accessory Clinical Findings  CBC  Recent Labs  12/03/15 0311  WBC 9.5  HGB 11.5*  HCT 36.0*  MCV 97.6  PLT 183   Basic Metabolic Panel  Recent Labs  12/03/15 0311  NA 135  K 3.9  CL 104  CO2 22  GLUCOSE 103*  BUN 8  CREATININE 0.77  CALCIUM 8.7*     Radiology/Studies  Dg Chest 2 View  Result Date: 12/02/2015 CLINICAL DATA:  Pt had a defibrillator placed yesterdayNo chest complaints this morning Hx of atrial fibrillation EXAM: CHEST  2 VIEW COMPARISON:  11/24/2015 FINDINGS: Opacity projects laterally from the inferior aspect of the right hilum, and is also noted in the medial right lung base, most consistent with atelectasis. There is mild diffuse prominence of the interstitium. Lungs otherwise clear. Minimal pleural fluid blunts the posterior costophrenic sulci. No overt pulmonary edema. No pneumothorax. Cardiac silhouette is normal in size. No mediastinal or hilar masses. Left anterior chest wall  single lead pacemaker is well positioned. IMPRESSION: 1. No acute cardiopulmonary disease. No evidence of a complication following pacemaker placement. No pneumothorax. Electronically Signed   By: Amie Portland M.D.   On: 12/02/2015 07:54   Ct Head Wo Contrast  Result Date: 11/22/2015 CLINICAL DATA:  Concern regarding anoxic encephalopathy. Respiratory failure and heart failure. EXAM: CT HEAD WITHOUT CONTRAST TECHNIQUE: Contiguous axial images were obtained from the base of the skull through the vertex without intravenous contrast. COMPARISON:  11/17/2015 FINDINGS: Brain: The brain demonstrates stable CT appearance with no evidence of hemorrhage, infarction, edema, mass effect, extra-axial fluid collection, hydrocephalus or mass lesion. Vascular: No hyperdense vessel or unexpected calcification. Skull: The skull is unremarkable. Sinuses/Orbits: Mucosal thickening within both maxillary antra, left  greater than right. Additional mucosal thickening in bilateral ethmoid and sphenoid air cells. IMPRESSION: Stable head CT demonstrating no acute findings or evidence of visible infarcts. Mucosal sinus thickening. Electronically Signed   By: Irish Lack M.D.   On: 11/22/2015 16:03   Ct Head Wo Contrast  Result Date: 11/17/2015 CLINICAL DATA:  Acute encephalopathy. Cardiac arrest with clean left heart catheterization today. EXAM: CT HEAD WITHOUT CONTRAST TECHNIQUE: Contiguous axial images were obtained from the base of the skull through the vertex without intravenous contrast. COMPARISON:  None. FINDINGS: Brain: Mild cerebral atrophy. No ventricular dilatation. No mass effect or midline shift. No abnormal extra-axial fluid collections. Gray-white matter junctions are distinct. Basal cisterns are not effaced. No evidence of acute intracranial hemorrhage. Vascular: No hyperdense vessel or unexpected calcification. Skull: No depressed skull fractures. Sinuses/Orbits: Diffuse opacification of a ethmoid air cells and sphenoid sinuses with air-fluid levels in the maxillary antra and mucosal thickening in the frontal sinuses. Mastoid air cells are not opacified. Other: None. IMPRESSION: No acute intracranial abnormalities. Fluid in the paranasal sinuses. Electronically Signed   By: Burman Nieves M.D.   On: 11/17/2015 01:47   Ct Angio Chest Pe W Or Wo Contrast  Result Date: 11/22/2015 CLINICAL DATA:  Cardiac arrest and respiratory failure. Aspiration pneumonia. EXAM: CT ANGIOGRAPHY CHEST WITH CONTRAST TECHNIQUE: Multidetector CT imaging of the chest was performed using the standard protocol during bolus administration of intravenous contrast. Multiplanar CT image reconstructions and MIPs were obtained to evaluate the vascular anatomy. CONTRAST:  80 mL Isovue 370 IV COMPARISON:  Chest x-ray earlier today. FINDINGS: Cardiovascular: Pulmonary arteries are well opacified. There is no evidence of pulmonary embolism.  The thoracic aorta is of normal caliber. There is separate origin of the left vertebral artery off of the aortic arch. The heart is mildly enlarged. No pericardial effusion identified. There is some calcified plaque in the LAD. Mediastinum/Nodes: No evidence of mediastinal masses or lymphadenopathy. Some small mediastinal lymph nodes present. No evidence of hilar lymphadenopathy. Lungs/Pleura: Ground-glass opacity and alveolar airspace disease in the upper lobes bilaterally and extending into the lingula. This likely represents alveolar edema. There is dense consolidation and atelectasis of both lower lobes in a fairly symmetric pattern. This most likely represents atelectasis but there may also be a component of aspiration pneumonia. No associated pleural effusion. No pneumothorax. Endotracheal tube present with the tip above the carina. Upper Abdomen: Nasogastric tube enters the stomach. Musculoskeletal: No significant bony abnormalities. Review of the MIP images confirms the above findings. IMPRESSION: 1. No evidence of pulmonary embolism. 2. Calcified plaque seen in the distribution of the LAD. 3. Pulmonary edema with alveolar opacities in both upper lobes. Dense airspace consolidation and volume loss in the lower lobes bilaterally. This most  likely represents atelectasis. Component of aspiration pneumonia cannot be excluded. Electronically Signed   By: Irish Lack M.D.   On: 11/22/2015 16:08   Dg Chest Port 1 View  Result Date: 11/24/2015 CLINICAL DATA:  Respiratory failure. EXAM: PORTABLE CHEST 1 VIEW COMPARISON:  11/23/2015 FINDINGS: Endotracheal tube remains in place with tip just a below the level of the clavicular heads, well above the carina. Left jugular central venous catheter is unchanged and terminates over the upper SVC. Enteric tube courses towards the upper abdomen with tip not imaged. Cardiac silhouette is upper limits of normal to mildly enlarged in size, unchanged. Lung volumes remain  diminished with mildly increased patchy opacity in the left lung base. No sizable pleural effusion or pneumothorax is identified. IMPRESSION: Low lung volumes with mildly increased left basilar opacity, likely atelectasis. Electronically Signed   By: Sebastian Ache M.D.   On: 11/24/2015 08:13   Dg Chest Port 1 View  Result Date: 11/23/2015 CLINICAL DATA:  New onset AFib status post cardiac arrest and hypothermia protocol EXAM: PORTABLE CHEST 1 VIEW COMPARISON:  CTA chest dated 11/22/2015 FINDINGS: Endotracheal tube terminates 2.5 cm above the carina. Low lung volumes. No focal consolidation. No pleural effusion or pneumothorax. Heart is top-normal in size. Left IJ venous catheter terminates in the mid SVC. IMPRESSION: Endotracheal tube terminates 2.5 cm above the carina. Low lung volumes. Electronically Signed   By: Charline Bills M.D.   On: 11/23/2015 07:44   Dg Chest Port 1 View  Result Date: 11/22/2015 CLINICAL DATA:  Respiratory failure. EXAM: PORTABLE CHEST 1 VIEW COMPARISON:  11/21/2015 FINDINGS: Endotracheal tube remains with the tip approximately 3 cm above the carina. Stable positioning of left jugular central line with the tip at the origin of the SVC. Slight worsening of atelectasis in the left perihilar region. Stable elevation of the right hemidiaphragm. No overt edema, pleural effusions or pneumothorax identified. IMPRESSION: Slight increase in left perihilar atelectasis. Electronically Signed   By: Irish Lack M.D.   On: 11/22/2015 08:13   Dg Chest Port 1 View  Result Date: 11/21/2015 CLINICAL DATA:  Intubation. EXAM: PORTABLE CHEST 1 VIEW COMPARISON:  11/20/2015. FINDINGS: Interim removal Swan-Ganz catheter. Endotracheal tube, NG tube, left IJ line and stable position. Stable cardiomegaly. Left lower lobe infiltrate. No pleural effusion or pneumothorax. Prior cervical spine fusion. IMPRESSION: 1. Interim removal of Swan-Ganz catheter. Remaining lines and tubes in stable position. 2.   Stable cardiomegaly with mild pulmonary venous congestion. 3. Improving bibasilar atelectasis. Persistent infiltrate left lower lobe. Electronically Signed   By: Maisie Fus  Register   On: 11/21/2015 06:47   Dg Chest Port 1 View  Result Date: 11/20/2015 CLINICAL DATA:  Cardiac arrest. EXAM: PORTABLE CHEST 1 VIEW COMPARISON:  11/19/2015 . FINDINGS: Endotracheal tube, NG tube, Swan-Ganz catheter in stable position. Cardiomegaly with mild pulmonary venous congestion noted. Low lung volumes with mild bibasilar atelectasis and/or infiltrates. No pleural effusion or pneumothorax .Prior cervical spine fusion. IMPRESSION: 1.  Lines and tubes in stable position. 2. Cardiomegaly with mild pulmonary venous congestion. 3. Low lung volumes with mild bibasilar atelectasis and/or infiltrates. Electronically Signed   By: Maisie Fus  Register   On: 11/20/2015 07:09   Dg Chest Port 1 View  Result Date: 11/19/2015 CLINICAL DATA:  Shortness of breath. EXAM: PORTABLE CHEST 1 VIEW COMPARISON:  11/18/2015. FINDINGS: Endotracheal tube, Swan-Ganz catheter, NG tube, left IJ line in stable position. Stable cardiomegaly. Unchanged bilateral pulmonary infiltrates and or edema, left side greater right. No pleural effusion or  pneumothorax. Prior cervical spine fusion. IMPRESSION: 1. Lines and tubes in stable position. 2. Unchanged bilateral pulmonary infiltrates and or edema, left side greater right. 3. Stable cardiomegaly. Electronically Signed   By: Maisie Fus  Register   On: 11/19/2015 06:57   Dg Chest Port 1 View  Result Date: 11/18/2015 CLINICAL DATA:  Central line placement. EXAM: PORTABLE CHEST 1 VIEW COMPARISON:  11/17/2015. FINDINGS: Endotracheal tube, NG tube, Swan-Ganz catheter, left IJ line stable position. Stable cardiomegaly. Unchanged bilateral infiltrates and/or pneumonia. No pleural effusion or pneumothorax. Prior cervical spine fusion. IMPRESSION: 1.  Lines and tubes in stable position. 2. Unchanged bilateral pulmonary  infiltrates and/or edema, left side greater than right. 3. Stable cardiomegaly. Electronically Signed   By: Maisie Fus  Register   On: 11/18/2015 06:55   Dg Chest Port 1 View  Result Date: 11/17/2015 CLINICAL DATA:  Swan-Ganz catheter placement. EXAM: PORTABLE CHEST 1 VIEW COMPARISON:  Yesterday. FINDINGS: Interval right jugular Swan-Ganz catheter with its tip in the intersegmental pulmonary artery on the right. No pneumothorax. Stable left jugular catheter. Endotracheal tube in satisfactory position. Nasogastric tube tip and side hole in the proximal stomach. Bilateral airspace opacity with no significant overall change. Normal sized heart. No pleural fluid seen. Cervical spine fixation hardware. IMPRESSION: 1. Right jugular catheter tip in the intersegmental pulmonary artery on the right without pneumothorax. 2. No significant change in probable bilateral pneumonia. Electronically Signed   By: Beckie Salts M.D.   On: 11/17/2015 17:09   Dg Chest Port 1 View  Result Date: 11/17/2015 CLINICAL DATA:  Respiratory failure.  Central line placement. EXAM: PORTABLE CHEST 1 VIEW COMPARISON:  11/16/2015 at at 21:22 FINDINGS: There is a new left jugular central line with tip in the SVC at the azygos vein junction. No significant pneumothorax. Endotracheal tube remains satisfactorily positioned with its tip approximately 4.5 cm above the carina. The enteric tube extends into the stomach and beyond the inferior edge of the image. Airspace opacities persist bilaterally without significant interval change. IMPRESSION: 1. New left jugular central line appears satisfactorily positioned. No significant pneumothorax. 2. Continued satisfactory positions of the ET tube and NG tube. 3. Persistent airspace opacities bilaterally without significant interval change. Electronically Signed   By: Ellery Plunk M.D.   On: 11/17/2015 01:19   Dg Chest Portable 1 View  Result Date: 11/16/2015 CLINICAL DATA:  Respiratory failure.   Intubation. EXAM: PORTABLE CHEST 1 VIEW COMPARISON:  None. FINDINGS: The endotracheal tube is 3.3 cm above the carina. The enteric tube extends at least to the distal esophagus, continuing B on the inferior edge of the image. Diffuse airspace opacities are present bilaterally, left worse than right. No large pneumothorax or large effusion evident on this supine portable radiograph. IMPRESSION: 1. Satisfactorily positioned ETT. Enteric tube extends beyond the inferior edge of the image but reaches at least as far as the distal esophagus. 2. Diffuse airspace opacities bilaterally, left worse than right. Electronically Signed   By: Ellery Plunk M.D.   On: 11/16/2015 21:45   Dg Swallowing Func-speech Pathology  Result Date: 11/25/2015 Objective Swallowing Evaluation: Type of Study: MBS-Modified Barium Swallow Study Patient Details Name: Arthur Chase MRN: [id number] Date of Birth: [birth year] Today's Date: 11/25/2015 Time: SLP Start Time (ACUTE ONLY): 1253-SLP Stop Time (ACUTE ONLY): 1318 SLP Time Calculation (min) (ACUTE ONLY): 25 min Past Medical History: Past Medical History: Diagnosis Date . Atrial fibrillation with rapid ventricular response (HCC) 11/20/2015 Past Surgical History: Past Surgical History: Procedure Laterality Date . CARDIAC CATHETERIZATION N/A  11/16/2015  Procedure: Left Heart Cath and Coronary Angiography;  Surgeon: Peter M [place], MD;  Location: [facility] INVASIVE CV LAB;  Service: Cardiovascular;  Laterality: N/A; HPI: Pt is 60 y.o male with obesity and hypertension who was admitted due to cardiac arrest. Pt was intubated for 8 days and was extubated on 9/4. Subjective: Pt was alert and cooperative for swallow evaluation Assessment / Plan / Recommendation CHL IP CLINICAL IMPRESSIONS 11/25/2015 Therapy Diagnosis Mild pharyngeal phase dysphagia Clinical Impression Upon arrival, pt did cough up clear secretions before MBS began. Following the study, pt presents with mild pharyngeal dysphagia. Pt displayed  a delay in swallow to the vallecula and reduced epiglottic closure when given thin liquids. While the pt did not aspirate during MBS, pt had silent penetration to the vocal cords during thin liquid and solid trials. It is unclear whether the penetration observed during the  solid trial was due to puree residue in the vallecula or the solid bolus. Pt required cues for throat clearing to clear penetration. Recommend Dys 2 diet and thin liquid with likely diet improvement given time post extubation.  Impact on safety and function Mild aspiration risk   CHL IP TREATMENT RECOMMENDATION 11/25/2015 Treatment Recommendations Therapy as outlined in treatment plan below   Prognosis 11/25/2015 Prognosis for Safe Diet Advancement Good Barriers to Reach Goals Cognitive deficits Barriers/Prognosis Comment -- CHL IP DIET RECOMMENDATION 11/25/2015 SLP Diet Recommendations Dysphagia 2 (Fine chop) solids;Thin liquid Liquid Administration via Cup;Straw Medication Administration Whole meds with puree Compensations Slow rate;Small sips/bites;Minimize environmental distractions;Clear throat intermittently Postural Changes Remain semi-upright after after feeds/meals (Comment);Seated upright at 90 degrees   CHL IP OTHER RECOMMENDATIONS 11/25/2015 Recommended Consults -- Oral Care Recommendations Oral care BID Other Recommendations Have oral suction available   CHL IP FOLLOW UP RECOMMENDATIONS 11/25/2015 Follow up Recommendations 24 hour supervision/assistance   CHL IP FREQUENCY AND DURATION 11/25/2015 Speech Therapy Frequency (ACUTE ONLY) min 2x/week Treatment Duration 2 weeks      CHL IP ORAL PHASE 11/25/2015 Oral Phase WFL Oral - Pudding Teaspoon -- Oral - Pudding Cup -- Oral - Honey Teaspoon -- Oral - Honey Cup -- Oral - Nectar Teaspoon -- Oral - Nectar Cup -- Oral - Nectar Straw -- Oral - Thin Teaspoon -- Oral - Thin Cup -- Oral - Thin Straw -- Oral - Puree -- Oral - Mech Soft -- Oral - Regular -- Oral - Multi-Consistency -- Oral - Pill -- Oral  Phase - Comment --  CHL IP PHARYNGEAL PHASE 11/25/2015 Pharyngeal Phase Impaired Pharyngeal- Pudding Teaspoon -- Pharyngeal -- Pharyngeal- Pudding Cup -- Pharyngeal -- Pharyngeal- Honey Teaspoon -- Pharyngeal -- Pharyngeal- Honey Cup -- Pharyngeal -- Pharyngeal- Nectar Teaspoon -- Pharyngeal -- Pharyngeal- Nectar Cup -- Pharyngeal -- Pharyngeal- Nectar Straw -- Pharyngeal -- Pharyngeal- Thin Teaspoon -- Pharyngeal -- Pharyngeal- Thin Cup Penetration/Aspiration during swallow;Delayed swallow initiation-vallecula;Reduced epiglottic inversion Pharyngeal Material enters airway, CONTACTS cords and not ejected out Pharyngeal- Thin Straw Penetration/Aspiration during swallow;Delayed swallow initiation-vallecula;Reduced epiglottic inversion Pharyngeal Material enters airway, CONTACTS cords and not ejected out Pharyngeal- Puree Pharyngeal residue - valleculae;Penetration/Apiration after swallow Pharyngeal Material enters airway, CONTACTS cords and not ejected out Pharyngeal- Mechanical Soft -- Pharyngeal -- Pharyngeal- Regular Pharyngeal residue - valleculae;Penetration/Aspiration during swallow Pharyngeal Material enters airway, CONTACTS cords and not ejected out Pharyngeal- Multi-consistency -- Pharyngeal -- Pharyngeal- Pill WFL Pharyngeal -- Pharyngeal Comment --  CHL IP CERVICAL ESOPHAGEAL PHASE 11/25/2015 Cervical Esophageal Phase WFL Pudding Teaspoon -- Pudding Cup -- Honey Teaspoon -- Honey Cup -- Nectar Teaspoon --  Nectar Cup -- Nectar Straw -- Thin Teaspoon -- Thin Cup -- Thin Straw -- Puree -- Mechanical Soft -- Regular -- Multi-consistency -- Pill -- Cervical Esophageal Comment -- No flowsheet data found. Maxcine Ham 11/25/2015, 3:14 PM Note populated for Jearl Klinefelter, student SLP  Maxcine Ham, M.A. CCC-SLP [phone]             Mr Card Morphology Wo/w Cm  Result Date: 11/26/2015 CLINICAL DATA:  Cardiac Arrest EXAM: CARDIAC MRI TECHNIQUE: The patient was scanned on a 1.5 Tesla GE magnet. A dedicated  cardiac coil was used. Functional imaging was done using Fiesta sequences. 2,3, and 4 chamber views were done to assess for RWMA's. Modified Simpson's rule using a short axis stack was used to calculate an ejection fraction on a dedicated work [employer]. The patient received 28 cc of Multihance. After 10 minutes inversion recovery sequences were used to assess for infiltration and scar tissue. CONTRAST:  28 cc Multihance FINDINGS: All 4 cardiac chambers were normal in size and function. There was no ASD/VSD or pericardial effusion. The septum was 14 mm with no evidence of HOCM or SAM. The quantitative EF was 68% (EDV 130 cc ESV 41 cc SV 89 cc) Delayed enhancement showed no scar, infiltration or infarct. RV size and function were normal Dysplasia sequences not performed IMPRESSION: 1) Moderate LVH EF 68% no RWMA;s 2) No delayed gadolinium uptake No scar/infiltration of LV myocardium 3) Normal RV size and function Charlton Haws Electronically Signed   By: Charlton Haws M.D.   On: 11/26/2015 10:13   ECHO 8/31:  - Left ventricle: The cavity size was moderately dilated. There was mild concentric hypertrophy. Systolic function was severely reduced. The estimated ejection fraction was in the range of 25% to 30%. Diffuse hypokinesis. The study is not technically sufficient to allow evaluation of LV diastolic function. - Aortic valve: Trileaflet; normal thickness leaflets. There was no regurgitation. - Aortic root: The aortic root was normal in size. - Mitral valve: Structurally normal valve. There was no regurgitation. - Left atrium: The atrium was moderately dilated. - Right ventricle: The cavity size was mildly dilated. Wall thickness was normal. Systolic function was moderately reduced. - Right atrium: Pacer wire or catheter noted in right atrium. - Tricuspid valve: There was mild regurgitation. - Pulmonary arteries: Systolic pressure was within the normal range. PA  peak pressure: 33 mm Hg (S). - Inferior vena cava: The vessel was normal in size. - Pericardium, extracardiac: There was no pericardial effusion. Impressions: - Severe biventricular dysfunction. No significant change from the prior exam.  CATH: 11/16/15   The left ventricular systolic function is normal.  LV end diastolic pressure is normal.  The left ventricular ejection fraction is 55-65% by visual estimate.  Cardiac MR 11/26/15 IMPRESSION: 1) Moderate LVH EF 68% no RWMA;s 2) No delayed gadolinium uptake No scar/infiltration of LV myocardium 3) Normal RV size and function   ASSESSMENT AND PLAN 60 y/o ? 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  Out of hospital cardiac arrest/VF/NSVT: Unclear etiology.  Nl cors on cath 8/27. S/P cooling. Extubated 9/4.  Neuro s/o. VT quiescent on amio, now on PO Amio 400mg  daily. Seen by EP and now s/p ICD placement on 12/01/15. EF now ~68% by CMR. Plan is to keep him on amio 400mg  daily x2 weeks and then go down  to 200mg  daily until follow up with EP.    Acute resp failure/VDRF:  Extubated 9/4. Now completley resolved.  Acute systolic CHF/NICM: EF 25-30% by initial echo, now 65-70% by CMRI.  Wt down to 213 from peak of 270 lbs.  Creat stable. Suspect some of the reduced EF was simply post VF related stunning. -- Tolerating low dose valsartan 25mg  daily, Toprol XL 12.5mg  daily and spiro 12.5mg  daily.   Anoxic Encephalopathy: now completely resolved.  Hypokalemia: resolved on Kdur BID and spiro 12.5mg  daily. Would get BMET at follow up  Heparin Induced thrombocytopenia:  previously on heparin for rule out ACS then rule out PE. Cath clean and CT showed no evidence of PE so heparin discontinued. Platelets declined to 87 on 9/3 and HIT antigen = 2.629 on 9/5. Platelets appeared to be trending  back up so DTI therapy held in setting of planned ICD implantation, but platelets declined again on 9/6. Therefore, he was started on the HIT protocol with argatroban and ICD postponed until platelets recovered. --Antibody panel showed a highly positive state. Warfarin started after ICD placed. I disuccsed case with Dr. Gwenyth Bouillon with Heme/onc who said as long as he has had no evidence of thrombosis, he can discontinue coumadin after 2 months. Discussed dosing with pharmacy. INR 1.4 today. Will discharged him on 5mg  daily x2 days and have him seen in the coumadin clinic on Friday.  PAF:  Maintaining sinus on  amiodarone. Continue to monitor. Not sure if this going to be a long-term issue or not, but would probably consider anticoagulation post ICD for at least the first few months in order to determine if there is any recurrence of A. Fib. 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 If after 2 months, there is no further evidence of atrial fibrillation on device, we can stop Coumadin. CHADSVASC of 2.    Signed, Cline Crock PA-C  Pager [phone] The patient has been seen in conjunction with Carlean Jews, PAC. All aspects of care have been considered and discussed. The patient has been personally interviewed, examined, and all clinical data has been reviewed.   We have done extensive review of the patient's records, spoken to a hematology consultant Pietro Cassis), spoken to EP, and spoken to cardiology colleagues who rounded on the patient over the last several weeks.  We feel he is ready for discharge. He will need a pulse oximeter for home. 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 He also had brief atrial fibrillation during acute illness. Continuation of Coumadin after 2 months will be dependent upon whether or not recurrent atrial fibrillation occurs. He will have telemetry observation via his device. Perhaps in 6 months if no atrial fib is identified, Coumadin could be discontinued to  antiplatelet therapy only.  Will follow up with primary care, cardiology, and EP. Initial cardiology contact was with Dr. [place] in the setting of STEMI and subsequent rounder was Dr. Excell Seltzer.

## 2015-12-03 NOTE — Care Management Note (Signed)
Case Management Note Donn Pierini RN, BSN Unit 2W-Case Manager [phone]  Patient Details  Name: Arthur Chase MRN: [id number] Date of Birth: 1956-03-15  Subjective/Objective:   Pt admitted s/p cardiac arrest - tx from 2H to 2W on 11/27/15                Action/Plan: PTA pt lived at home with wife, anticipate return home with [facility] services- pt will need ICD placement prior to discharge. - CM to follow for d/c needs  Expected Discharge Date:    12/03/15              Expected Discharge Plan:  Home/Self Care  In-House Referral:     Discharge planning Services  CM Consult  Post Acute Care Choice:    Choice offered to:     DME Arranged:    DME Agency:     HH Arranged:    HH Agency:     Status of Service:  Completed, signed off  If discussed at [employer] of Stay Meetings, dates discussed:  9/11  Discharge Disposition: home self care   Additional Comments:  12/03/15- 1400- Jocelin Schuelke rN, cM- pt for d/c home today with wife- no CM needs noted- per updated PT notes no f/u needed.   Darrold Span, RN 12/03/2015, 2:16 PM

## 2015-12-03 NOTE — Discharge Summary (Signed)
Discharge Summary    Patient ID: Arthur Chase,  MRN: [id number], DOB/AGE: 07/18/1955 60 y.o.  Admit date: 11/16/2015 Discharge date: 12/03/2015  Primary Care Provider: No primary care provider on file. Primary Cardiologist: Dr. Excell Seltzer / Dr. Elberta Fortis    Discharge Diagnoses    Principal Problem:   Cardiac arrest with ventricular fibrillation [facility]) Active Problems:   Acute respiratory failure with hypoxemia (HCC)   Acute encephalopathy   Cardiogenic shock (HCC)   Atrial fibrillation with rapid ventricular response (HCC)   Acute systolic CHF (congestive heart failure) (HCC)   Hypokalemia   NSVT (nonsustained ventricular tachycardia) (HCC)   HIT (heparin-induced thrombocytopenia) (HCC)   HTN (hypertension)   ICD (implantable cardioverter-defibrillator) in place   PAF (paroxysmal atrial fibrillation) (HCC)   Allergies Allergies  Allergen Reactions  . Heparin     Thrombocytopenia. 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     History of Present Illness     60 year old male with obesity and hypertension was brought to the [facility] department on 11/16/2015 after a cardiac arrest at home.   His children provide history as he was comatose on initial evaluation. They reported that he had been in his usual state of health but on 11/16/2015 they could hear him breathing heavily from another room. They came immediately found him to be unresponsive. They called 911 and started CPR. The fire department arrived nearly immediately and her vital shock via AED and CPR. Paramedics arrived and found him to be in V. fib. Total time of CPR is estimated to be approximately 16 minutes. En route he had V. fib on 2 other occasions which were successfully defibrillated. In the emergency department he was noted to be agitated while intubated. He was taken to the Cath Lab emergently.  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 Now s/p ICD placement and placed on Coumadin for HIT (also for afib if there is recurrence).  Out of hospital cardiac arrest/VF/NSVT: Unclear etiology. Nl cors on cath 8/27. S/P cooling. Extubated 9/4. VT quiescent on amio.  -- Seen by EP and now s/p ICD placement on 12/01/15: [employer] Visia AF MRI (serial  Number [id number]) ICD. -- EF normalized to ~68% by CMR with structurally normal heart.  -- Plan is to keep him on amio 400mg  daily x2 weeks and then go down to 200mg  daily until follow up with EP.  -- Wound check and follow up with Dr. Elberta Fortis has been arranged.   Acute resp failure/VDRF: Extubated 9/4. Now completley resolved.  Acute systolic CHF/NICM: EF 25-30%by initial echo, now 65-70% by CMRI. Wt down to 213 from peak of 270 lbs with diuresis.  -- Suspect some of the reduced EF was simply post VF related stunning. -- Tolerating low dose valsartan 25mg  daily, Toprol XL 12.5mg  daily and spiro 12.5mg  daily. No long term diuretic felt necessary with resolution of cardiomyopathy  Anoxic Encephalopathy: now completely resolved.  Hypokalemia: resolved on Kdur BID and spiro 12.5mg  daily. Would get BMET at follow up  Heparin Induced thrombocytopenia:he was started on heparin for rule out ACS then rule out PE. Cath clean and CT showed no evidence of PE so heparin discontinued. Platelets declined to 87 on 9/3 and HIT antigen = 2.629 on 9/5. Platelets appeared  to be trending back up so DTI therapy held in setting of planned ICD implantation, but platelets declined again on 9/6. Therefore, he was started on the HIT protocol with argatroban and ICD postponed until platelets recovered.  --Antibody panel showed a highly positive state and therefore Warfarin started after ICD placement. I disuccsed case with Dr. Gwenyth Bouillon with  Heme/Onc who said as long as he has had no evidence of thrombosis, he can discontinue coumadin after 2 months. Discussed dosing with pharmacy. INR 1.4 today. Will discharge him on 5mg  daily x2 days and have him seen in the coumadin clinic on Thursday (Friday was full). No bridging felt necessary  PXT:GGYIRSWNIOE sinus on  amiodarone. Not sure if this going to be a long-term issue or not, but would probably consider anticoagulation post ICD for at least the first few months in order to determine if there is any recurrence of A. Fib. Continue Warfarin in the setting of HIT for now. Per Dr. Katrinka Blazing, if after 6 months, there is no further evidence of atrial fibrillation on device, we can stop Coumadin. He has a CHADSVASC of 2.    The patient has had an complicated hospital course but is recovering well. The radial catheter site is stable He has been seen by Dr. Ival Bible today and deemed ready for discharge home. All follow-up appointments have been scheduled. Discharge medications are listed below.  _____________  Discharge Vitals Blood pressure 119/61, pulse 73, temperature 98.8 F (37.1 C), temperature source Oral, resp. rate 18, height 5\' 10"  (1.778 m), weight 213 lb 6.4 oz (96.8 kg), SpO2 93 %.  Filed Weights   12/01/15 0525 12/02/15 0230 12/03/15 0541  Weight: 217 lb 12.8 oz (98.8 kg) 216 lb 3.2 oz (98.1 kg) 213 lb 6.4 oz (96.8 kg)    Labs & Radiologic Studies     CBC  Recent Labs  12/03/15 0311  WBC 9.5  HGB 11.5*  HCT 36.0*  MCV 97.6  PLT 183   Basic Metabolic Panel  Recent Labs  12/03/15 0311  NA 135  K 3.9  CL 104  CO2 22  GLUCOSE 103*  BUN 8  CREATININE 0.77  CALCIUM 8.7*     Dg Chest 2 View  Result Date: 12/02/2015 CLINICAL DATA:  Pt had a defibrillator placed yesterdayNo chest complaints this morning Hx of atrial fibrillation EXAM: CHEST  2 VIEW COMPARISON:  11/24/2015 FINDINGS: Opacity projects laterally from the inferior aspect of the right hilum, and is also  noted in the medial right lung base, most consistent with atelectasis. There is mild diffuse prominence of the interstitium. Lungs otherwise clear. Minimal pleural fluid blunts the posterior costophrenic sulci. No overt pulmonary edema. No pneumothorax. Cardiac silhouette is normal in size. No mediastinal or hilar masses. Left anterior chest wall single lead pacemaker is well positioned. IMPRESSION: 1. No acute cardiopulmonary disease. No evidence of a complication following pacemaker placement. No pneumothorax. Electronically Signed   By: Amie Portland M.D.   On: 12/02/2015 07:54   Ct Head Wo Contrast  Result Date: 11/22/2015 CLINICAL DATA:  Concern regarding anoxic encephalopathy. Respiratory failure and heart failure. EXAM: CT HEAD WITHOUT CONTRAST TECHNIQUE: Contiguous axial images were obtained from the base of the skull through the vertex without intravenous contrast. COMPARISON:  11/17/2015 FINDINGS: Brain: The brain demonstrates stable CT appearance with no evidence of hemorrhage, infarction, edema, mass effect, extra-axial fluid collection, hydrocephalus or mass lesion. Vascular: No hyperdense vessel or unexpected calcification. Skull: The skull is unremarkable. Sinuses/Orbits: Mucosal thickening  within both maxillary antra, left greater than right. Additional mucosal thickening in bilateral ethmoid and sphenoid air cells. IMPRESSION: Stable head CT demonstrating no acute findings or evidence of visible infarcts. Mucosal sinus thickening. Electronically Signed   By: Irish Lack M.D.   On: 11/22/2015 16:03   Ct Head Wo Contrast  Result Date: 11/17/2015 CLINICAL DATA:  Acute encephalopathy. Cardiac arrest with clean left heart catheterization today. EXAM: CT HEAD WITHOUT CONTRAST TECHNIQUE: Contiguous axial images were obtained from the base of the skull through the vertex without intravenous contrast. COMPARISON:  None. FINDINGS: Brain: Mild cerebral atrophy. No ventricular dilatation. No mass  effect or midline shift. No abnormal extra-axial fluid collections. Gray-white matter junctions are distinct. Basal cisterns are not effaced. No evidence of acute intracranial hemorrhage. Vascular: No hyperdense vessel or unexpected calcification. Skull: No depressed skull fractures. Sinuses/Orbits: Diffuse opacification of a ethmoid air cells and sphenoid sinuses with air-fluid levels in the maxillary antra and mucosal thickening in the frontal sinuses. Mastoid air cells are not opacified. Other: None. IMPRESSION: No acute intracranial abnormalities. Fluid in the paranasal sinuses. Electronically Signed   By: Burman Nieves M.D.   On: 11/17/2015 01:47   Ct Angio Chest Pe W Or Wo Contrast  Result Date: 11/22/2015 CLINICAL DATA:  Cardiac arrest and respiratory failure. Aspiration pneumonia. EXAM: CT ANGIOGRAPHY CHEST WITH CONTRAST TECHNIQUE: Multidetector CT imaging of the chest was performed using the standard protocol during bolus administration of intravenous contrast. Multiplanar CT image reconstructions and MIPs were obtained to evaluate the vascular anatomy. CONTRAST:  80 mL Isovue 370 IV COMPARISON:  Chest x-ray earlier today. FINDINGS: Cardiovascular: Pulmonary arteries are well opacified. There is no evidence of pulmonary embolism. The thoracic aorta is of normal caliber. There is separate origin of the left vertebral artery off of the aortic arch. The heart is mildly enlarged. No pericardial effusion identified. There is some calcified plaque in the LAD. Mediastinum/Nodes: No evidence of mediastinal masses or lymphadenopathy. Some small mediastinal lymph nodes present. No evidence of hilar lymphadenopathy. Lungs/Pleura: Ground-glass opacity and alveolar airspace disease in the upper lobes bilaterally and extending into the lingula. This likely represents alveolar edema. There is dense consolidation and atelectasis of both lower lobes in a fairly symmetric pattern. This most likely represents  atelectasis but there may also be a component of aspiration pneumonia. No associated pleural effusion. No pneumothorax. Endotracheal tube present with the tip above the carina. Upper Abdomen: Nasogastric tube enters the stomach. Musculoskeletal: No significant bony abnormalities. Review of the MIP images confirms the above findings. IMPRESSION: 1. No evidence of pulmonary embolism. 2. Calcified plaque seen in the distribution of the LAD. 3. Pulmonary edema with alveolar opacities in both upper lobes. Dense airspace consolidation and volume loss in the lower lobes bilaterally. This most likely represents atelectasis. Component of aspiration pneumonia cannot be excluded. Electronically Signed   By: Irish Lack M.D.   On: 11/22/2015 16:08   Dg Chest Port 1 View  Result Date: 11/24/2015 CLINICAL DATA:  Respiratory failure. EXAM: PORTABLE CHEST 1 VIEW COMPARISON:  11/23/2015 FINDINGS: Endotracheal tube remains in place with tip just a below the level of the clavicular heads, well above the carina. Left jugular central venous catheter is unchanged and terminates over the upper SVC. Enteric tube courses towards the upper abdomen with tip not imaged. Cardiac silhouette is upper limits of normal to mildly enlarged in size, unchanged. Lung volumes remain diminished with mildly increased patchy opacity in the left lung base. No sizable pleural  effusion or pneumothorax is identified. IMPRESSION: Low lung volumes with mildly increased left basilar opacity, likely atelectasis. Electronically Signed   By: Sebastian Ache M.D.   On: 11/24/2015 08:13   Dg Chest Port 1 View  Result Date: 11/23/2015 CLINICAL DATA:  New onset AFib status post cardiac arrest and hypothermia protocol EXAM: PORTABLE CHEST 1 VIEW COMPARISON:  CTA chest dated 11/22/2015 FINDINGS: Endotracheal tube terminates 2.5 cm above the carina. Low lung volumes. No focal consolidation. No pleural effusion or pneumothorax. Heart is top-normal in size. Left IJ  venous catheter terminates in the mid SVC. IMPRESSION: Endotracheal tube terminates 2.5 cm above the carina. Low lung volumes. Electronically Signed   By: Charline Bills M.D.   On: 11/23/2015 07:44   Dg Chest Port 1 View  Result Date: 11/22/2015 CLINICAL DATA:  Respiratory failure. EXAM: PORTABLE CHEST 1 VIEW COMPARISON:  11/21/2015 FINDINGS: Endotracheal tube remains with the tip approximately 3 cm above the carina. Stable positioning of left jugular central line with the tip at the origin of the SVC. Slight worsening of atelectasis in the left perihilar region. Stable elevation of the right hemidiaphragm. No overt edema, pleural effusions or pneumothorax identified. IMPRESSION: Slight increase in left perihilar atelectasis. Electronically Signed   By: Irish Lack M.D.   On: 11/22/2015 08:13   Dg Chest Port 1 View  Result Date: 11/21/2015 CLINICAL DATA:  Intubation. EXAM: PORTABLE CHEST 1 VIEW COMPARISON:  11/20/2015. FINDINGS: Interim removal Swan-Ganz catheter. Endotracheal tube, NG tube, left IJ line and stable position. Stable cardiomegaly. Left lower lobe infiltrate. No pleural effusion or pneumothorax. Prior cervical spine fusion. IMPRESSION: 1. Interim removal of Swan-Ganz catheter. Remaining lines and tubes in stable position. 2.  Stable cardiomegaly with mild pulmonary venous congestion. 3. Improving bibasilar atelectasis. Persistent infiltrate left lower lobe. Electronically Signed   By: Maisie Fus  Register   On: 11/21/2015 06:47   Dg Chest Port 1 View  Result Date: 11/20/2015 CLINICAL DATA:  Cardiac arrest. EXAM: PORTABLE CHEST 1 VIEW COMPARISON:  11/19/2015 . FINDINGS: Endotracheal tube, NG tube, Swan-Ganz catheter in stable position. Cardiomegaly with mild pulmonary venous congestion noted. Low lung volumes with mild bibasilar atelectasis and/or infiltrates. No pleural effusion or pneumothorax .Prior cervical spine fusion. IMPRESSION: 1.  Lines and tubes in stable position. 2.  Cardiomegaly with mild pulmonary venous congestion. 3. Low lung volumes with mild bibasilar atelectasis and/or infiltrates. Electronically Signed   By: Maisie Fus  Register   On: 11/20/2015 07:09   Dg Chest Port 1 View  Result Date: 11/19/2015 CLINICAL DATA:  Shortness of breath. EXAM: PORTABLE CHEST 1 VIEW COMPARISON:  11/18/2015. FINDINGS: Endotracheal tube, Swan-Ganz catheter, NG tube, left IJ line in stable position. Stable cardiomegaly. Unchanged bilateral pulmonary infiltrates and or edema, left side greater right. No pleural effusion or pneumothorax. Prior cervical spine fusion. IMPRESSION: 1. Lines and tubes in stable position. 2. Unchanged bilateral pulmonary infiltrates and or edema, left side greater right. 3. Stable cardiomegaly. Electronically Signed   By: Maisie Fus  Register   On: 11/19/2015 06:57   Dg Chest Port 1 View  Result Date: 11/18/2015 CLINICAL DATA:  Central line placement. EXAM: PORTABLE CHEST 1 VIEW COMPARISON:  11/17/2015. FINDINGS: Endotracheal tube, NG tube, Swan-Ganz catheter, left IJ line stable position. Stable cardiomegaly. Unchanged bilateral infiltrates and/or pneumonia. No pleural effusion or pneumothorax. Prior cervical spine fusion. IMPRESSION: 1.  Lines and tubes in stable position. 2. Unchanged bilateral pulmonary infiltrates and/or edema, left side greater than right. 3. Stable cardiomegaly. Electronically Signed   By:  Thomas  Register   On: 11/18/2015 06:55   Dg Chest Port 1 View  Result Date: 11/17/2015 CLINICAL DATA:  Swan-Ganz catheter placement. EXAM: PORTABLE CHEST 1 VIEW COMPARISON:  Yesterday. FINDINGS: Interval right jugular Swan-Ganz catheter with its tip in the intersegmental pulmonary artery on the right. No pneumothorax. Stable left jugular catheter. Endotracheal tube in satisfactory position. Nasogastric tube tip and side hole in the proximal stomach. Bilateral airspace opacity with no significant overall change. Normal sized heart. No pleural fluid seen.  Cervical spine fixation hardware. IMPRESSION: 1. Right jugular catheter tip in the intersegmental pulmonary artery on the right without pneumothorax. 2. No significant change in probable bilateral pneumonia. Electronically Signed   By: Beckie SaltsSteven  Reid M.D.   On: 11/17/2015 17:09   Dg Chest Port 1 View  Result Date: 11/17/2015 CLINICAL DATA:  Respiratory failure.  Central line placement. EXAM: PORTABLE CHEST 1 VIEW COMPARISON:  11/16/2015 at at 21:22 FINDINGS: There is a new left jugular central line with tip in the SVC at the azygos vein junction. No significant pneumothorax. Endotracheal tube remains satisfactorily positioned with its tip approximately 4.5 cm above the carina. The enteric tube extends into the stomach and beyond the inferior edge of the image. Airspace opacities persist bilaterally without significant interval change. IMPRESSION: 1. New left jugular central line appears satisfactorily positioned. No significant pneumothorax. 2. Continued satisfactory positions of the ET tube and NG tube. 3. Persistent airspace opacities bilaterally without significant interval change. Electronically Signed   By: Ellery Plunkaniel R Mitchell M.D.   On: 11/17/2015 01:19   Dg Chest Portable 1 View  Result Date: 11/16/2015 CLINICAL DATA:  Respiratory failure.  Intubation. EXAM: PORTABLE CHEST 1 VIEW COMPARISON:  None. FINDINGS: The endotracheal tube is 3.3 cm above the carina. The enteric tube extends at least to the distal esophagus, continuing B on the inferior edge of the image. Diffuse airspace opacities are present bilaterally, left worse than right. No large pneumothorax or large effusion evident on this supine portable radiograph. IMPRESSION: 1. Satisfactorily positioned ETT. Enteric tube extends beyond the inferior edge of the image but reaches at least as far as the distal esophagus. 2. Diffuse airspace opacities bilaterally, left worse than right. Electronically Signed   By: Ellery Plunkaniel R Mitchell M.D.   On: 11/16/2015  21:45   Dg Swallowing Func-speech Pathology  Result Date: 11/25/2015 Objective Swallowing Evaluation: Type of Study: MBS-Modified Barium Swallow Study Patient Details Name: Arthur Chase MRN: [id number] Date of Birth: 06/17/1955 Today's Date: 11/25/2015 Time: SLP Start Time (ACUTE ONLY): 1253-SLP Stop Time (ACUTE ONLY): 1318 SLP Time Calculation (min) (ACUTE ONLY): 25 min Past Medical History: Past Medical History: Diagnosis Date . Atrial fibrillation with rapid ventricular response (HCC) 11/20/2015 Past Surgical History: Past Surgical History: Procedure Laterality Date . CARDIAC CATHETERIZATION N/A 11/16/2015  Procedure: Left Heart Cath and Coronary Angiography;  Surgeon: Peter M SwazilandJordan, MD;  [facility];  Service: Cardiovascular;  Laterality: N/A; HPI: Pt is 60 y.o male with obesity and hypertension who was admitted due to cardiac arrest. Pt was intubated for 8 days and was extubated on 9/4. Subjective: Pt was alert and cooperative for swallow evaluation Assessment / Plan / Recommendation CHL IP CLINICAL IMPRESSIONS 11/25/2015 Therapy Diagnosis Mild pharyngeal phase dysphagia Clinical Impression Upon arrival, pt did cough up clear secretions before MBS began. Following the study, pt presents with mild pharyngeal dysphagia. Pt displayed a delay in swallow to the vallecula and reduced epiglottic closure when given thin liquids. While the pt did  not aspirate during MBS, pt had silent penetration to the vocal cords during thin liquid and solid trials. It is unclear whether the penetration observed during the  solid trial was due to puree residue in the vallecula or the solid bolus. Pt required cues for throat clearing to clear penetration. Recommend Dys 2 diet and thin liquid with likely diet improvement given time post extubation.  Impact on safety and function Mild aspiration risk   CHL IP TREATMENT RECOMMENDATION 11/25/2015 Treatment Recommendations Therapy as outlined in treatment plan below    Prognosis 11/25/2015 Prognosis for Safe Diet Advancement Good Barriers to Reach Goals Cognitive deficits Barriers/Prognosis Comment -- CHL IP DIET RECOMMENDATION 11/25/2015 SLP Diet Recommendations Dysphagia 2 (Fine chop) solids;Thin liquid Liquid Administration via Cup;Straw Medication Administration Whole meds with puree Compensations Slow rate;Small sips/bites;Minimize environmental distractions;Clear throat intermittently Postural Changes Remain semi-upright after after feeds/meals (Comment);Seated upright at 90 degrees   CHL IP OTHER RECOMMENDATIONS 11/25/2015 Recommended Consults -- Oral Care Recommendations Oral care BID Other Recommendations Have oral suction available   CHL IP FOLLOW UP RECOMMENDATIONS 11/25/2015 Follow up Recommendations 24 hour supervision/assistance   CHL IP FREQUENCY AND DURATION 11/25/2015 Speech Therapy Frequency (ACUTE ONLY) min 2x/week Treatment Duration 2 weeks      CHL IP ORAL PHASE 11/25/2015 Oral Phase WFL Oral - Pudding Teaspoon -- Oral - Pudding Cup -- Oral - Honey Teaspoon -- Oral - Honey Cup -- Oral - Nectar Teaspoon -- Oral - Nectar Cup -- Oral - Nectar Straw -- Oral - Thin Teaspoon -- Oral - Thin Cup -- Oral - Thin Straw -- Oral - Puree -- Oral - Mech Soft -- Oral - Regular -- Oral - Multi-Consistency -- Oral - Pill -- Oral Phase - Comment --  CHL IP PHARYNGEAL PHASE 11/25/2015 Pharyngeal Phase Impaired Pharyngeal- Pudding Teaspoon -- Pharyngeal -- Pharyngeal- Pudding Cup -- Pharyngeal -- Pharyngeal- Honey Teaspoon -- Pharyngeal -- Pharyngeal- Honey Cup -- Pharyngeal -- Pharyngeal- Nectar Teaspoon -- Pharyngeal -- Pharyngeal- Nectar Cup -- Pharyngeal -- Pharyngeal- Nectar Straw -- Pharyngeal -- Pharyngeal- Thin Teaspoon -- Pharyngeal -- Pharyngeal- Thin Cup Penetration/Aspiration during swallow;Delayed swallow initiation-vallecula;Reduced epiglottic inversion Pharyngeal Material enters airway, CONTACTS cords and not ejected out Pharyngeal- Thin Straw Penetration/Aspiration during  swallow;Delayed swallow initiation-vallecula;Reduced epiglottic inversion Pharyngeal Material enters airway, CONTACTS cords and not ejected out Pharyngeal- Puree Pharyngeal residue - valleculae;Penetration/Apiration after swallow Pharyngeal Material enters airway, CONTACTS cords and not ejected out Pharyngeal- Mechanical Soft -- Pharyngeal -- Pharyngeal- Regular Pharyngeal residue - valleculae;Penetration/Aspiration during swallow Pharyngeal Material enters airway, CONTACTS cords and not ejected out Pharyngeal- Multi-consistency -- Pharyngeal -- Pharyngeal- Pill WFL Pharyngeal -- Pharyngeal Comment --  CHL IP CERVICAL ESOPHAGEAL PHASE 11/25/2015 Cervical Esophageal Phase WFL Pudding Teaspoon -- Pudding Cup -- Honey Teaspoon -- Honey Cup -- Nectar Teaspoon -- Nectar Cup -- Nectar Straw -- Thin Teaspoon -- Thin Cup -- Thin Straw -- Puree -- Mechanical Soft -- Regular -- Multi-consistency -- Pill -- Cervical Esophageal Comment -- No flowsheet data found. Maxcine Ham 11/25/2015, 3:14 PM Note populated for Jearl Klinefelter, student SLP  Maxcine Ham, M.A. CCC-SLP [phone]             Mr Card Morphology Wo/w Cm  Result Date: 11/26/2015 CLINICAL DATA:  Cardiac Arrest EXAM: CARDIAC MRI TECHNIQUE: The patient was scanned on a 1.5 Tesla GE magnet. A dedicated cardiac coil was used. Functional imaging was done using Fiesta sequences. 2,3, and 4 chamber views were done to assess for RWMA's. Modified Simpson's rule using a short  axis stack was used to calculate an ejection fraction on a dedicated work [employer]. The patient received 28 cc of Multihance. After 10 minutes inversion recovery sequences were used to assess for infiltration and scar tissue. CONTRAST:  28 cc Multihance FINDINGS: All 4 cardiac chambers were normal in size and function. There was no ASD/VSD or pericardial effusion. The septum was 14 mm with no evidence of HOCM or SAM. The quantitative EF was 68% (EDV 130 cc ESV 41 cc SV 89  cc) Delayed enhancement showed no scar, infiltration or infarct. RV size and function were normal Dysplasia sequences not performed IMPRESSION: 1) Moderate LVH EF 68% no RWMA;s 2) No delayed gadolinium uptake No scar/infiltration of LV myocardium 3) Normal RV size and function Charlton Haws Electronically Signed   By: Charlton Haws M.D.   On: 11/26/2015 10:13     Diagnostic Studies/Procedures   ECHO 11/20/15:  - Left ventricle: The cavity size was moderately dilated. There was mild concentric hypertrophy. Systolic function was severely reduced. The estimated ejection fraction was in the range of 25% to 30%. Diffuse hypokinesis. The study is not technically sufficient to allow evaluation of LV diastolic function. - Aortic valve: Trileaflet; normal thickness leaflets. There was no regurgitation. - Aortic root: The aortic root was normal in size. - Mitral valve: Structurally normal valve. There was no regurgitation. - Left atrium: The atrium was moderately dilated. - Right ventricle: The cavity size was mildly dilated. Wall thickness was normal. Systolic function was moderately reduced. - Right atrium: Pacer wire or catheter noted in right atrium. - Tricuspid valve: There was mild regurgitation. - Pulmonary arteries: Systolic pressure was within the normal range. PA peak pressure: 33 mm Hg (S). - Inferior vena cava: The vessel was normal in size. - Pericardium, extracardiac: There was no pericardial effusion. Impressions: - Severe biventricular dysfunction. No significant change from the prior exam.  _____________   CATH: 11/16/15   The left ventricular systolic function is normal.  LV end diastolic pressure is normal.  The left ventricular ejection fraction is 55-65% by visual estimate.  _____________    Cardiac MR 11/26/15 IMPRESSION: 1) Moderate LVH EF 68% no RWMA;s 2) No delayed gadolinium uptake No scar/infiltration of LV myocardium 3) Normal RV  size and function  _____________  Conclusion 12/01/15 SURGEON:  Loman Brooklyn, MD      PREPROCEDURE DIAGNOSES:   1. Ventricular fibrillation.      POSTPROCEDURE DIAGNOSES:   1. Ventricular fibrillation     PROCEDURES:    1. ICD implantation.     INTRODUCTION:  Arthur Chase is a 60 y.o. male who presented to the hospital with ventricular fibrillation. At this time, he presents to the EP lab for ICD implant for secondary prevention of sudden death.  He had a previous epinephrine challenge which was negative.    RV Lead Placement: The left axillary vein was cannulated with fluoroscopic visualization.  Through the left axillary vein, a Medtronic model [id number] (serial number [id number]) right ventricular defibrillator lead was advanced with fluoroscopic visualization into the right ventricular apex position.  The right ventricular lead R-wave measured 11 mV with impedance of 67. ohms and a threshold of 0.6 volts at 0.5 milliseconds.   The leads were secured to the pectoralis  fascia using #2 silk suture over the suture sleeves.  The pocket then  irrigated with copious gentamicin solution.  The leads were then  connected to a Medtronic Visia AF MRI (serial  Number [id number])  ICD.  The defibrillator was placed into the  pocket.  The pocket was then closed in 3 layers with 2.0 Vicryl suture  for the subcutaneous and 3.0 Vicryl suture subcuticular layers. EBL<15ml.  Steri-Strips and a  sterile dressing were then applied.    CONCLUSIONS:   1. Ventricular fibrillation   2. Successful ICD implantation.   3. No early apparent complications.       Disposition   Pt is being discharged home today in good condition.  Follow-up Plans & Appointments    Follow-up Information    [facility] [address] Office Follow up on 12/11/2015.   Specialty:  Cardiology Why:  3:30PM, wound check Contact information: [address], [address] [place] [address] [phone]         Will [name], MD Follow up on 03/01/2016.   Specialty:  Cardiology Why:  11:30AM Contact information: [address] [place] [address] [phone]        [facility]. Go on 12/04/2015.   Why:  @ 2:30pm for new coumadin clinic appointment Contact information: [address] [place] [address] [phone]       Laurann Montana, PA-C. Go on 12/09/2015.   Specialties:  Cardiology, Radiology Why:  @ 10:30am (please show up 15 minutes early) Contact information: [address] [place] [address] [phone]            Discharge Medications     Medication List    STOP taking these medications   lisinopril-hydrochlorothiazide 20-25 MG tablet Commonly known as:  PRINZIDE,ZESTORETIC     TAKE these medications   amiodarone 400 MG tablet Commonly known as:  PACERONE Take 1 tablet (400 mg total) by mouth as directed. 400mg  (1 tab) daily x2 weeks followed by 200mg  (1/2 tablet) thereafter   losartan 25 MG tablet Commonly known as:  COZAAR Take 1 tablet (25 mg total) by mouth daily. Start taking on:  12/04/2015   metoprolol succinate 25 MG 24 hr tablet Commonly known as:  TOPROL-XL Take 0.5 tablets (12.5 mg total) by mouth daily. Start taking on:  12/04/2015   multivitamin with minerals Tabs tablet Take 1 tablet by mouth daily. Centrum Silver   pantoprazole 40 MG tablet Commonly known as:  PROTONIX Take 1 tablet (40 mg total) by mouth daily. Start taking on:  12/04/2015   potassium chloride SA 20 MEQ tablet Commonly known as:  K-DUR,KLOR-CON Take 1 tablet (20 mEq total) by mouth 2 (two) times daily.   spironolactone 25 MG tablet Commonly known as:  ALDACTONE Take 0.5 tablets (12.5 mg total) by mouth daily. Start taking on:  12/04/2015   warfarin 5 MG tablet Commonly known as:  COUMADIN Take 1 tablet (5 mg total) by mouth as directed. Take  5 mg until you are seen in the coumadin clinic for further instructions          Outstanding Labs/Studies   BMET, coumadin clinic, wound check   Duration of Discharge Encounter   Greater than 30 minutes including physician time.  Signed, Cline Crock PA-C 12/03/2015, 2:08 PM   The patient has been seen in conjunction with Carlean Jews, PAC. All aspects of care have been considered and discussed. The patient has been personally interviewed, examined, and all clinical data has been reviewed.   We have done extensive review of the patient's records, spoken to a hematology consultant Pietro Cassis), spoken to EP, and  spoken to cardiology colleagues who rounded on the patient over the last several weeks.  We feel he is ready for discharge. He will need a pulse oximeter for home. 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 He also had brief atrial fibrillation during acute illness. Continuation of Coumadin after 2 months will be dependent upon whether or not recurrent atrial fibrillation occurs. He will have telemetry observation via his device. Perhaps in 6 months if no atrial fib is identified, Coumadin could be discontinued to antiplatelet therapy only.  Will follow up with primary care, cardiology, and EP. Initial cardiology contact was with Dr. Swaziland in the setting of STEMI and subsequent rounder was Dr. Excell Seltzer.

## 2015-12-04 ENCOUNTER — Ambulatory Visit (INDEPENDENT_AMBULATORY_CARE_PROVIDER_SITE_OTHER): Payer: Managed Care, Other (non HMO) | Admitting: Pharmacist

## 2015-12-04 DIAGNOSIS — I4891 Unspecified atrial fibrillation: Secondary | ICD-10-CM

## 2015-12-04 DIAGNOSIS — Z5181 Encounter for therapeutic drug level monitoring: Secondary | ICD-10-CM | POA: Diagnosis not present

## 2015-12-04 LAB — POCT INR: INR: 1.9

## 2015-12-08 ENCOUNTER — Encounter: Payer: Self-pay | Admitting: Physician Assistant

## 2015-12-08 DIAGNOSIS — I428 Other cardiomyopathies: Secondary | ICD-10-CM | POA: Insufficient documentation

## 2015-12-08 DIAGNOSIS — E669 Obesity, unspecified: Secondary | ICD-10-CM | POA: Insufficient documentation

## 2015-12-08 NOTE — Progress Notes (Addendum)
Cardiology Office Note    Date:  12/09/2015  ID:  Arthur Chase, DOB 1955-03-25, MRN [id number] PCP:  Hollice Espy, MD  Cardiologist:  Dr. Excell Seltzer / EP - Dr. Elberta Fortis   Chief Complaint: f/u cardiac arrest  History of Present Illness:  Arthur Chase is a 60 y.o. male with history of obesity, HTN, recent OOH cardiac arrest 10/2015 c/b anoxic encephalopathy, cardiogenic shock, VDRF, acute systolic CHF/NICM (EF as low as 20% during 10/2015 hospitalization, up to 68% by cMRI 11/26/14), VF/NSVT, heparin-induced thrombocytopenia (on Coumadin for this + afib), hypokalemia, and transient afib who presents for follow-up. Please see excellent discharge summary from 12/03/15. He was admitted 11/16/15 with cardiac arrest and EMS strips demonstrating multiple episodes of VT/VF with a cycle length ~235msec. He was successfully rescuscitated - underwent a prolonged hospital admission s/p cooling. LHC 11/16/15: normal cors, EF 55-65%. 2D Echo 11/17/15: technically difficult, mod LVH, EF 20%. F/u limited echo 11/20/15: EF 25-30%, diffuse HK, mod LAE, mildly dilated RV with moderately reduced systolic function, mild TR. CT angio 11/22/15 was negative for PE. Cardiac MRI 11/26/15 with mod LVH, EF 68%, no RWMA, no delayed gadolinium uptake, no scar/infiltration of LV myocardium, normal RV size and function. Underwent ICD implantation with a Medtronic Visia AF MRI (serial  Number [id number]) ICD on 12/01/15. EP recommended amiodarone 400mg  daily x2 weeks and then go down to 200mg  daily until follow up with EP. 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 The case was discussed with heme/onc who recommended as long as he has had no evidence of thrombosis, he can discontinue coumadin after 2 months from HIT perspective. He was not felt to require bridging. It is not yet clear if his atrial fib is a chronic issue or just occurred in the setting of the above. Dr  Katrinka Blazing felt if there was no further evidence of atrial fibrillation on device, we can stop Coumadin in 6 months. CHADSVASC 2. Baseline LFTs, TSH OK during recent adm.  He returns for follow-up having made a remarkable recovery for all that he has been through. He does feel generally weak/fatigued with activity at times and reports reduced endurance. No specific complaints of CP, SOB or dizziness. No further syncope. No falls, LEE or orthopnea. He states he's been told by friends and family that he must be "as strong as a bull." He is aware of the recommendation for no driving for 6 months - his son is driving him during this time.   Past Medical History:  Diagnosis Date  . Anoxic encephalopathy (HCC) 10/2015   a. 10/2015 in setting of cardiac arrest - recovered prior to DC.  . Cardiac arrest with ventricular fibrillation (HCC)    a. 11/2015: Unclear etiology. Nl cors on cath 8/27. S/P cooling. On amiodarone and s/p ICD for secondary prevention on 12/01/15. EF down to 25-30% but normalized to 68 by subsequent cardiac MR  . HIT (heparin-induced thrombocytopenia) (HCC)    a. 11/2015 during admission for VF arrest. HIT panel positive. Placed on coumadin for 2 months  . HTN (hypertension)   . Hypokalemia    a. 11/2015: resolved on K supplementation and spironolactone  . ICD (implantable cardioverter-defibrillator) in place    a. Medtronic Visia AF MRI (serial  Number [id number]) ICD.  Marland Kitchen Myocardial stunning (HCC)    a. 11/2015: EF down to 20-25% after VF arrest but improved to ~68 by subsequent cardiac MR.  Marland Kitchen  NICM (nonischemic cardiomyopathy) (HCC)   . Obesity   . PAF (paroxysmal atrial fibrillation) (HCC)    a. 11/2015 brief run of afib with RVR while intubated during a complicated admission for VF arrest and VDRF. No recurrence on amio. CHADSVASC 2 (CHF, HTN) and on coumadin for HIT with positive thrombotic markers. If no long term recurrence, he may be able to come off OAC  . Ventricular tachycardia  (HCC) 10/2015    Past Surgical History:  Procedure Laterality Date  . CARDIAC CATHETERIZATION N/A 11/16/2015   Procedure: Left Heart Cath and Coronary Angiography;  Surgeon: Peter M SwazilandJordan, MD;  [facility];  Service: Cardiovascular;  Laterality: N/A;  . EP IMPLANTABLE DEVICE N/A 12/01/2015   Procedure: ICD Implant;  Surgeon: Will Jorja LoaMartin Camnitz, MD;  Location: [facility] INVASIVE CV LAB;  Service: Cardiovascular;  Laterality: N/A;    Current Medications: Current Outpatient Prescriptions  Medication Sig Dispense Refill  . amiodarone (PACERONE) 400 MG tablet Take 1 tablet (400 mg total) by mouth as directed. 400mg  (1 tab) daily x2 weeks followed by 200mg  (1/2 tablet) thereafter 30 tablet 1  . losartan (COZAAR) 25 MG tablet Take 1 tablet (25 mg total) by mouth daily. 30 tablet 6  . metoprolol succinate (TOPROL-XL) 25 MG 24 hr tablet Take 0.5 tablets (12.5 mg total) by mouth daily. 30 tablet 6  . Multiple Vitamin (MULTIVITAMIN WITH MINERALS) TABS tablet Take 1 tablet by mouth daily. Centrum Silver    . pantoprazole (PROTONIX) 40 MG tablet Take 1 tablet (40 mg total) by mouth daily. 30 tablet 6  . potassium chloride SA (K-DUR,KLOR-CON) 20 MEQ tablet Take 1 tablet (20 mEq total) by mouth 2 (two) times daily. 60 tablet 6  . spironolactone (ALDACTONE) 25 MG tablet Take 0.5 tablets (12.5 mg total) by mouth daily. 30 tablet 6  . warfarin (COUMADIN) 5 MG tablet Take 1 tablet (5 mg total) by mouth as directed. Take 5 mg until you are seen in the coumadin clinic for further instructions 30 tablet 3   No current facility-administered medications for this visit.      Allergies:   Heparin   Social History   Social History  . Marital status: Married    Spouse name: N/A  . Number of children: N/A  . Years of education: N/A   Social History Main Topics  . Smoking status: Current Every Day Smoker    Types: Cigarettes  . Smokeless tobacco: Never Used  . Alcohol use No  . Drug use: No  .  Sexual activity: Not Asked   Other Topics Concern  . None   Social History Narrative  . None     Family History:  The patient's family history includes Alzheimer's disease in his father; Heart attack in his mother.   ROS:   Please see the history of present illness. All other systems are reviewed and otherwise negative.    PHYSICAL EXAM:   VS:  BP 110/80   Pulse 90   Ht 5\' 10"  (1.778 m)   Wt 212 lb 6.4 oz (96.3 kg)   SpO2 95%   BMI 30.48 kg/m   BMI: Body mass index is 30.48 kg/m. GEN: Well nourished, well developed WM, in no acute distress  HEENT: normocephalic, atraumatic Neck: no JVD, carotid bruits, or masses Cardiac: RRR; no murmurs, rubs, or gallops, no edema. ICD placed Left upper chest with in tact steristrips with no hematoma, drainage or bleeding Respiratory:  clear to auscultation bilaterally, normal work  of breathing GI: soft, nontender, nondistended, + BS MS: no deformity or atrophy  Skin: warm and dry, no rash Neuro:  Alert and Oriented x 3, Strength and sensation are intact, follows commands Psych: euthymic mood, full affect  Wt Readings from Last 3 Encounters:  12/09/15 212 lb 6.4 oz (96.3 kg)  12/03/15 213 lb 6.4 oz (96.8 kg)      Studies/Labs Reviewed:   EKG:  EKG was ordered today and personally reviewed by me and demonstrates NSR 90bpm, NSIVCD, no acute changes, QTc  Recent Labs: 11/17/2015: B Natriuretic Peptide 41.7 11/18/2015: TSH 1.597 11/19/2015: ALT 43 11/28/2015: Magnesium 2.8 12/03/2015: BUN 8; Creatinine, Ser 0.77; Hemoglobin 11.5; Platelets 183; Potassium 3.9; Sodium 135   Additional studies/ records that were reviewed today include: Summarized above.    ASSESSMENT & PLAN:   1. Cardiac arrest with ventricular fibrillation - etiology not totally clear. S/p ICD. His wound/defib check was scheduled for 12/11/15 - we have called down to device clinic to see if they can check today to save him a visit. He is currently on amiodarone  400mg  daily with plans to reduce to 200mg  daily on 12/17/15 (reviewed with him today). He will remain on this until seen by EP. If amiodarone is chosen as a long-term medication, monitoring of organ systems while on amiodarone will be at discretion of his electrophysiologist. Will observe for any recurrent ventricular arrhythmias with device. Recheck BMET, Mg today given med initiation in the hospital. He reports deconditioning which is likely due to prolonged hospital stay. He expresses interest in cardiac rehab so I will refer. Remains out of work at this time and will be submitting paperwork from HR to our office. He is aware not to drive for 6 months post-hospitalization. 2. NICM - appears euvolemic. EF improved by cMRI prior to DC. Continue ARB, Toprol, spironolactone. 3. Paroxysmal atrial fib - unclear if precipitated by acute illness or if this will be a long-term issue. The established plan is to continue Coumadin for 6 months and consider discontinuing if no further AF identified by device surveillance. INR followed by Coumadin clinic. 4. Anoxic encephalopathy - has made a remarkable recovery. 5. Heparin-induced thrombocytopenia - as per discharge summary, heme-onc recommended Coumadin for 2 months then discontinue as long as he has no evidence of thrombosis. As above, he will actually remain on Coumadin for 6 months while we monitor for any recurrent afib.  Disposition: F/u with Dr. Excell Seltzer in 8 weeks. F/u Dr. Elberta Fortis scheduled for 02/2016. Will also tentatively arrange f/u Dr. Excell Seltzer in 6 months.   Medication Adjustments/Labs and Tests Ordered: Current medicines are reviewed at length with the patient today.  Concerns regarding medicines are outlined above. Medication changes, Labs and Tests ordered today are summarized above and listed in the Patient Instructions accessible in Encounters.   Thomasene Mohair PA-C  12/09/2015 11:04 AM    [facility] [address] [place],  [place], [place]  [address] Phone: [phone]; Fax: [phone]

## 2015-12-09 ENCOUNTER — Ambulatory Visit (INDEPENDENT_AMBULATORY_CARE_PROVIDER_SITE_OTHER): Payer: Managed Care, Other (non HMO) | Admitting: *Deleted

## 2015-12-09 ENCOUNTER — Encounter: Payer: Self-pay | Admitting: Physician Assistant

## 2015-12-09 ENCOUNTER — Ambulatory Visit (INDEPENDENT_AMBULATORY_CARE_PROVIDER_SITE_OTHER): Payer: Managed Care, Other (non HMO) | Admitting: Physician Assistant

## 2015-12-09 VITALS — BP 110/80 | HR 90 | Ht 70.0 in | Wt 212.4 lb

## 2015-12-09 DIAGNOSIS — I429 Cardiomyopathy, unspecified: Secondary | ICD-10-CM

## 2015-12-09 DIAGNOSIS — I472 Ventricular tachycardia: Secondary | ICD-10-CM | POA: Diagnosis not present

## 2015-12-09 DIAGNOSIS — I480 Paroxysmal atrial fibrillation: Secondary | ICD-10-CM

## 2015-12-09 DIAGNOSIS — I469 Cardiac arrest, cause unspecified: Secondary | ICD-10-CM

## 2015-12-09 DIAGNOSIS — I4891 Unspecified atrial fibrillation: Secondary | ICD-10-CM | POA: Diagnosis not present

## 2015-12-09 DIAGNOSIS — I4729 Other ventricular tachycardia: Secondary | ICD-10-CM

## 2015-12-09 DIAGNOSIS — I4901 Ventricular fibrillation: Secondary | ICD-10-CM

## 2015-12-09 DIAGNOSIS — Z5181 Encounter for therapeutic drug level monitoring: Secondary | ICD-10-CM | POA: Diagnosis not present

## 2015-12-09 DIAGNOSIS — I5021 Acute systolic (congestive) heart failure: Secondary | ICD-10-CM | POA: Diagnosis not present

## 2015-12-09 DIAGNOSIS — G931 Anoxic brain damage, not elsewhere classified: Secondary | ICD-10-CM

## 2015-12-09 DIAGNOSIS — D7582 Heparin induced thrombocytopenia (HIT): Secondary | ICD-10-CM

## 2015-12-09 DIAGNOSIS — I428 Other cardiomyopathies: Secondary | ICD-10-CM

## 2015-12-09 DIAGNOSIS — D75829 Heparin-induced thrombocytopenia, unspecified: Secondary | ICD-10-CM

## 2015-12-09 LAB — CUP PACEART INCLINIC DEVICE CHECK
Battery Remaining Longevity: 138 mo
Battery Voltage: 3.15 V
Date Time Interrogation Session: 20170919123653
HIGH POWER IMPEDANCE MEASURED VALUE: 68 Ohm
Lead Channel Impedance Value: 380 Ohm
Lead Channel Pacing Threshold Pulse Width: 0.4 ms
Lead Channel Sensing Intrinsic Amplitude: 12.375 mV
Lead Channel Setting Pacing Amplitude: 3.5 V
Lead Channel Setting Pacing Pulse Width: 0.4 ms
Lead Channel Setting Sensing Sensitivity: 0.3 mV
MDC IDC LEAD IMPLANT DT: 20170911
MDC IDC LEAD LOCATION: 753860
MDC IDC MSMT LEADCHNL RV IMPEDANCE VALUE: 456 Ohm
MDC IDC MSMT LEADCHNL RV PACING THRESHOLD AMPLITUDE: 0.625 V
MDC IDC MSMT LEADCHNL RV SENSING INTR AMPL: 12 mV
MDC IDC STAT BRADY RV PERCENT PACED: 0.01 %

## 2015-12-09 LAB — POCT INR: INR: 3.1

## 2015-12-09 NOTE — Patient Instructions (Addendum)
Medication Instructions:  Your physician recommends that you continue on your current medications as directed. Please refer to the Current Medication list given to you today.   Labwork: Your physician recommends that you return for lab work in: TODAY (bmet/mg)   Testing/Procedures: none  Follow-Up: Your physician recommends that you schedule a follow-up appointment in: 8 weeks with Dr. Excell Seltzer or Ronie Spies, PA  Your physician wants you to follow-up in: 6 months with Dr. Excell Seltzer. You will receive a reminder letter in the mail two months in advance. If you don't receive a letter, please call our office to schedule the follow-up appointment.   Any Other Special Instructions Will Be Listed Below (If Applicable).  Your provider recommends that you attend Cardiac Rehab.  Paperwork has been submitted to get you started and they should be in contact with you soon with your start date.    If you need a refill on your cardiac medications before your next appointment, please call your pharmacy.

## 2015-12-09 NOTE — Progress Notes (Signed)
Wound check appointment. Steri-strips removed. Wound without redness or edema. Incision edges approximated, wound well healed. Normal device function. Threshold, sensing, and impedances consistent with implant measurements. Device programmed at 3.5V for extra safety margin until 3 month visit. Histogram distribution appropriate for patient and level of activity. (1) ventricular arrhythmia noted x <1sec @ 176bpm. Patient educated about wound care, arm mobility, lifting restrictions, shock plan. ROV in 3 months with WC.

## 2015-12-10 ENCOUNTER — Telehealth: Payer: Self-pay

## 2015-12-10 LAB — MAGNESIUM: Magnesium: 2.2 mg/dL (ref 1.5–2.5)

## 2015-12-10 LAB — BASIC METABOLIC PANEL
BUN: 12 mg/dL (ref 7–25)
CALCIUM: 8.9 mg/dL (ref 8.6–10.3)
CO2: 24 mmol/L (ref 20–31)
CREATININE: 0.84 mg/dL (ref 0.70–1.25)
Chloride: 100 mmol/L (ref 98–110)
Glucose, Bld: 92 mg/dL (ref 65–99)
Potassium: 4.4 mmol/L (ref 3.5–5.3)
Sodium: 135 mmol/L (ref 135–146)

## 2015-12-10 NOTE — Telephone Encounter (Signed)
Courtesy Call on Arthur Chase of [employer]:  Day 1: Called and spoke with patient, told him his packet was in the mail. Asked if he would like to complete the process quicker. He said, "he would wait". 12/10/15 ab

## 2015-12-11 ENCOUNTER — Ambulatory Visit: Payer: Managed Care, Other (non HMO)

## 2015-12-16 ENCOUNTER — Ambulatory Visit (INDEPENDENT_AMBULATORY_CARE_PROVIDER_SITE_OTHER): Payer: Managed Care, Other (non HMO) | Admitting: *Deleted

## 2015-12-16 DIAGNOSIS — Z5181 Encounter for therapeutic drug level monitoring: Secondary | ICD-10-CM

## 2015-12-16 DIAGNOSIS — I4891 Unspecified atrial fibrillation: Secondary | ICD-10-CM | POA: Diagnosis not present

## 2015-12-16 LAB — POCT INR: INR: 2.1

## 2015-12-23 ENCOUNTER — Ambulatory Visit (INDEPENDENT_AMBULATORY_CARE_PROVIDER_SITE_OTHER): Payer: Managed Care, Other (non HMO) | Admitting: *Deleted

## 2015-12-23 DIAGNOSIS — Z5181 Encounter for therapeutic drug level monitoring: Secondary | ICD-10-CM | POA: Diagnosis not present

## 2015-12-23 DIAGNOSIS — I4891 Unspecified atrial fibrillation: Secondary | ICD-10-CM

## 2015-12-23 DIAGNOSIS — Z23 Encounter for immunization: Secondary | ICD-10-CM | POA: Diagnosis not present

## 2015-12-23 LAB — POCT INR: INR: 1.8

## 2015-12-30 ENCOUNTER — Ambulatory Visit (INDEPENDENT_AMBULATORY_CARE_PROVIDER_SITE_OTHER): Payer: Managed Care, Other (non HMO) | Admitting: *Deleted

## 2015-12-30 DIAGNOSIS — Z5181 Encounter for therapeutic drug level monitoring: Secondary | ICD-10-CM | POA: Diagnosis not present

## 2015-12-30 DIAGNOSIS — I4891 Unspecified atrial fibrillation: Secondary | ICD-10-CM | POA: Diagnosis not present

## 2015-12-30 LAB — POCT INR: INR: 1.6

## 2016-01-06 ENCOUNTER — Ambulatory Visit (INDEPENDENT_AMBULATORY_CARE_PROVIDER_SITE_OTHER): Payer: Managed Care, Other (non HMO) | Admitting: *Deleted

## 2016-01-06 DIAGNOSIS — I4891 Unspecified atrial fibrillation: Secondary | ICD-10-CM | POA: Diagnosis not present

## 2016-01-06 DIAGNOSIS — Z5181 Encounter for therapeutic drug level monitoring: Secondary | ICD-10-CM

## 2016-01-06 LAB — POCT INR: INR: 1.8

## 2016-01-13 ENCOUNTER — Encounter (INDEPENDENT_AMBULATORY_CARE_PROVIDER_SITE_OTHER): Payer: Self-pay

## 2016-01-13 ENCOUNTER — Ambulatory Visit (INDEPENDENT_AMBULATORY_CARE_PROVIDER_SITE_OTHER): Payer: Managed Care, Other (non HMO) | Admitting: *Deleted

## 2016-01-13 DIAGNOSIS — I4891 Unspecified atrial fibrillation: Secondary | ICD-10-CM | POA: Diagnosis not present

## 2016-01-13 DIAGNOSIS — Z5181 Encounter for therapeutic drug level monitoring: Secondary | ICD-10-CM

## 2016-01-13 LAB — POCT INR: INR: 2

## 2016-01-20 ENCOUNTER — Encounter (INDEPENDENT_AMBULATORY_CARE_PROVIDER_SITE_OTHER): Payer: Self-pay

## 2016-01-20 ENCOUNTER — Encounter: Payer: Self-pay | Admitting: Physician Assistant

## 2016-01-20 ENCOUNTER — Ambulatory Visit (INDEPENDENT_AMBULATORY_CARE_PROVIDER_SITE_OTHER): Payer: Managed Care, Other (non HMO) | Admitting: *Deleted

## 2016-01-20 DIAGNOSIS — Z5181 Encounter for therapeutic drug level monitoring: Secondary | ICD-10-CM | POA: Diagnosis not present

## 2016-01-20 DIAGNOSIS — I4891 Unspecified atrial fibrillation: Secondary | ICD-10-CM | POA: Diagnosis not present

## 2016-01-20 LAB — POCT INR: INR: 3.2

## 2016-01-27 ENCOUNTER — Ambulatory Visit (INDEPENDENT_AMBULATORY_CARE_PROVIDER_SITE_OTHER): Payer: Managed Care, Other (non HMO) | Admitting: *Deleted

## 2016-01-27 DIAGNOSIS — Z5181 Encounter for therapeutic drug level monitoring: Secondary | ICD-10-CM

## 2016-01-27 DIAGNOSIS — I4891 Unspecified atrial fibrillation: Secondary | ICD-10-CM | POA: Diagnosis not present

## 2016-01-27 LAB — POCT INR: INR: 2.4

## 2016-02-02 NOTE — Progress Notes (Signed)
Cardiology Office Note    Date:  02/03/2016  ID:  Arthur Chase, DOB Jan 11, 1956, MRN [id number] PCP:  Hollice Espy, MD  Cardiologist:  Dr. Wyvonnia Dusky   Chief Complaint: f/u cardiac arrest  History of Present Illness:  Arthur Chase is a 60 y.o. male with history of obesity, HTN, OOH cardiac arrest of unclear etiology 10/2015 c/b anoxic encephalopathy, cardiogenic shock, VDRF, acute systolic CHF/NICM (EF as low as 20% during 10/2015 hospitalization, up to 68% by cMRI 11/26/15), VF/NSVT, heparin-induced thrombocytopenia (on Coumadin for this + afib), hypokalemia, and transient afib who presents for follow-up. Please see excellent discharge summary from 12/03/15. He was admitted 11/16/15 with cardiac arrest and EMS strips demonstrating multiple episodes of VT/VF with a cycle length ~243msec. He was successfully rescuscitated - underwent a prolonged hospital admission s/p cooling. LHC 11/16/15: normal cors, EF 55-65%. 2D Echo 11/17/15: technically difficult, mod LVH, EF 20%. F/u limited echo 11/20/15: EF 25-30%, diffuse HK, mod LAE, mildly dilated RV with moderately reduced systolic function, mild TR. CT angio 11/22/15 was negative for PE. Cardiac MRI 11/26/15: mod LVH, EF 68%, no RWMA, no delayed gadolinium uptake, no scar/infiltration of LV myocardium, normal RV size and function. Underwent ICD implantation with a Medtronic Visia AF MRI (serial  Number [id number]) ICD on 12/01/15. EP recommended amiodarone 400mg  daily x2 weeks and then go down to 200mg  daily until follow up with EP. 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 The case was discussed with heme/onc who recommended as long as he has had no evidence of thrombosis, he can discontinue coumadin after 2 months from HIT perspective. He was not felt to require bridging. It is not yet clear if his atrial fib is a chronic issue or just occurred in the setting of the  above. Dr Katrinka Blazing felt if there was no further evidence of atrial fibrillation on device, we can stop Coumadin in 6 months (05/2016). CHADSVASC 2. Baseline LFTs, TSH OK during that admission. BMET/Mg wnl 11/2015. Lipids by PCP 12/2015: HDL 28, LDL 129, trig 192.  He presents back for follow-up doing great. He looks great - what a remarkable recovery. No CP, SOB, ICD discharge/beeping, palpitations, near-syncope or syncope. He is doing 1/2 tablet of amiodarone (200mg ) daily. He found out cardiac rehab only had a 5:30am slot available so he declined, but is walking 1/3 of a mile daily with his dog and also trying to stay active around the house. He tells me he has been in touch with his insurance company about returning to work - he says initially a date was set at 02/16/16 but per his review with the insurance company and Dr. Excell Seltzer, it sounds like they've decided late December is more suitable. I don't see this documented in phone notes, but I agree as this would give [place] a chance for him to f/u with EP as scheduled with Dr. Elberta Fortis in December before clearing to return.   Past Medical History:  Diagnosis Date  . Anoxic encephalopathy (HCC) 10/2015   a. 10/2015 in setting of cardiac arrest - recovered prior to DC.  . Cardiac arrest with ventricular fibrillation (HCC)    a. 11/2015: Unclear etiology. Nl cors on cath 8/27. S/P cooling. On amiodarone and s/p ICD for secondary prevention on 12/01/15. EF down to 25-30% but normalized to 68 by subsequent cardiac MR  . HIT (heparin-induced thrombocytopenia) (HCC)    a. 11/2015 during admission for  VF arrest. HIT panel positive. Placed on coumadin for 2 months  . HTN (hypertension)   . Hypokalemia    a. 11/2015: resolved on K supplementation and spironolactone  . ICD (implantable cardioverter-defibrillator) in place    a. Medtronic Visia AF MRI (serial  Number [id number]) ICD.  Marland Kitchen Myocardial stunning (HCC)    a. 11/2015: EF down to 20-25% after VF arrest but improved  to ~68 by subsequent cardiac MR.  Marland Kitchen NICM (nonischemic cardiomyopathy) (HCC)   . Obesity   . PAF (paroxysmal atrial fibrillation) (HCC)    a. 11/2015 brief run of afib with RVR while intubated during a complicated admission for VF arrest and VDRF. No recurrence on amio. CHADSVASC 2 (CHF, HTN) and on coumadin for HIT with positive thrombotic markers. If no long term recurrence, he may be able to come off OAC  . Ventricular tachycardia (HCC) 10/2015    Past Surgical History:  Procedure Laterality Date  . CARDIAC CATHETERIZATION N/A 11/16/2015   Procedure: Left Heart Cath and Coronary Angiography;  Surgeon: Peter M Swaziland, MD;  Location: [facility] INVASIVE CV LAB;  Service: Cardiovascular;  Laterality: N/A;  . EP IMPLANTABLE DEVICE N/A 12/01/2015   Procedure: ICD Implant;  Surgeon: Will [name], MD;  Location: [facility] INVASIVE CV LAB;  Service: Cardiovascular;  Laterality: N/A;    Current Medications: Current Outpatient Prescriptions  Medication Sig Dispense Refill  . amiodarone (PACERONE) 400 MG tablet Take 1 tablet (400 mg total) by mouth as directed. 400mg  (1 tab) daily x2 weeks followed by 200mg  (1/2 tablet) thereafter 30 tablet 1  . losartan (COZAAR) 25 MG tablet Take 1 tablet (25 mg total) by mouth daily. 30 tablet 6  . metoprolol succinate (TOPROL-XL) 25 MG 24 hr tablet Take 0.5 tablets (12.5 mg total) by mouth daily. 30 tablet 6  . Multiple Vitamin (MULTIVITAMIN WITH MINERALS) TABS tablet Take 1 tablet by mouth daily. Centrum Silver    . pantoprazole (PROTONIX) 40 MG tablet Take 1 tablet (40 mg total) by mouth daily. 30 tablet 6  . potassium chloride SA (K-DUR,KLOR-CON) 20 MEQ tablet Take 1 tablet (20 mEq total) by mouth 2 (two) times daily. 60 tablet 6  . spironolactone (ALDACTONE) 25 MG tablet Take 0.5 tablets (12.5 mg total) by mouth daily. 30 tablet 6  . warfarin (COUMADIN) 5 MG tablet Take 1 tablet (5 mg total) by mouth as directed. Take 5 mg until you are seen in the coumadin clinic for  further instructions 30 tablet 3   No current facility-administered medications for this visit.      Allergies:   Heparin   Social History   Social History  . Marital status: Married    Spouse name: N/A  . Number of children: N/A  . Years of education: N/A   Social History Main Topics  . Smoking status: Current Every Day Smoker    Types: Cigarettes  . Smokeless tobacco: Never Used  . Alcohol use No  . Drug use: No  . Sexual activity: Not Asked   Other Topics Concern  . None   Social History Narrative  . None     Family History:  The patient's family history includes Alzheimer's disease in his father; Heart attack in his mother.   ROS:   Please see the history of present illness.  All other systems are reviewed and otherwise negative.    PHYSICAL EXAM:   VS:  BP 124/64 (BP Location: Left Arm, Patient Position: Sitting, Cuff Size: Large)   Pulse  68   Ht 5\' 10"  (1.778 m)   Wt 221 lb (100.2 kg)   SpO2 97%   BMI 31.71 kg/m   BMI: Body mass index is 31.71 kg/m. GEN: Well nourished, well developed WM, in no acute distress  HEENT: normocephalic, atraumatic Neck: no JVD, carotid bruits, or masses Cardiac: RRR; no murmurs, rubs, or gallops, no edema  Respiratory:  clear to auscultation bilaterally, normal work of breathing GI: soft, nontender, nondistended, + BS MS: no deformity or atrophy  Skin: warm and dry, no rash Neuro:  Alert and Oriented x 3, Strength and sensation are intact, follows commands Psych: euthymic mood, full affect  Wt Readings from Last 3 Encounters:  02/03/16 221 lb (100.2 kg)  12/09/15 212 lb 6.4 oz (96.3 kg)  12/03/15 213 lb 6.4 oz (96.8 kg)      Studies/Labs Reviewed:   EKG:  EKG was ordered today and personally reviewed by me and demonstrates NSR 64bpm, left axis deviation, IRBBB, QTc 451ms, no acute changes.  Recent Labs: 11/17/2015: B Natriuretic Peptide 41.7 11/18/2015: TSH 1.597 11/19/2015: ALT 43 12/03/2015: Hemoglobin 11.5;  Platelets 183 12/09/2015: BUN 12; Creat 0.84; Magnesium 2.2; Potassium 4.4; Sodium 135   Lipid Panel No results found for: CHOL, TRIG, HDL, CHOLHDL, VLDL, LDLCALC, LDLDIRECT  Additional studies/ records that were reviewed today include: Summarized above.    ASSESSMENT & PLAN:   1. Cardiac arrest with VF - etiology not totally clear. S/p ICD, on amiodarone 200mg  daily per previous EP recs. Will obtain baseline PFTs given amiodarone therapy. If amiodarone is chosen as a long-term medication, monitoring of other organ systems while on amiodarone will be at discretion of his electrophysiologist. He already reports he gets a yearly eye exam each December. Would consider f/u LFTs/thyroid function at next visit. He was already advised no driving until 6 months after initial event. 2. NICM - appears euvolemic. EF improved by cMRI prior to hospital DC. Continue ARB, Toprol, spironolactone. F/u labs 11/2015 were stable. 3. Paroxysmal atrial fib - unclear if precipitated by acute illness or if this will be a long-term issue. The established plan is to continue Coumadin for 6 months (05/2016) and consider discontinuing if no further AF identified by device surveillance. INR followed by Coumadin clinic. No bleeding issues. 4. HIT - as per discharge summary, heme-onc recommended Coumadin for 2 months then discontinue as long as he has no evidence of thrombosis. As above, he will actually remain on Coumadin for 6 months while we monitor for any recurrent afib.  Disposition: F/u with Dr. Elberta Fortisamnitz in 02/2016 as scheduled. Also recommend f/u with Dr. Excell Seltzerooper in 04/2016.   Medication Adjustments/Labs and Tests Ordered: Current medicines are reviewed at length with the patient today.  Concerns regarding medicines are outlined above. Medication changes, Labs and Tests ordered today are summarized above and listed in the Patient Instructions accessible in Encounters.   Thomasene MohairSigned, Wiatt Mahabir PA-C  02/03/2016 10:15 AM      [facility] [address], [place], [place]  [address] Phone: [phone]; Fax: [phone]

## 2016-02-03 ENCOUNTER — Ambulatory Visit (INDEPENDENT_AMBULATORY_CARE_PROVIDER_SITE_OTHER): Payer: Managed Care, Other (non HMO) | Admitting: Pharmacist

## 2016-02-03 ENCOUNTER — Encounter: Payer: Self-pay | Admitting: Physician Assistant

## 2016-02-03 ENCOUNTER — Ambulatory Visit (INDEPENDENT_AMBULATORY_CARE_PROVIDER_SITE_OTHER): Payer: Managed Care, Other (non HMO) | Admitting: Internal Medicine

## 2016-02-03 ENCOUNTER — Ambulatory Visit (INDEPENDENT_AMBULATORY_CARE_PROVIDER_SITE_OTHER): Payer: Managed Care, Other (non HMO) | Admitting: Physician Assistant

## 2016-02-03 ENCOUNTER — Encounter (INDEPENDENT_AMBULATORY_CARE_PROVIDER_SITE_OTHER): Payer: Self-pay

## 2016-02-03 VITALS — BP 124/64 | HR 68 | Ht 70.0 in | Wt 221.0 lb

## 2016-02-03 DIAGNOSIS — Z9229 Personal history of other drug therapy: Secondary | ICD-10-CM

## 2016-02-03 DIAGNOSIS — I480 Paroxysmal atrial fibrillation: Secondary | ICD-10-CM

## 2016-02-03 DIAGNOSIS — I4901 Ventricular fibrillation: Secondary | ICD-10-CM

## 2016-02-03 DIAGNOSIS — D7582 Heparin induced thrombocytopenia (HIT): Secondary | ICD-10-CM

## 2016-02-03 DIAGNOSIS — I469 Cardiac arrest, cause unspecified: Secondary | ICD-10-CM

## 2016-02-03 DIAGNOSIS — I428 Other cardiomyopathies: Secondary | ICD-10-CM | POA: Diagnosis not present

## 2016-02-03 DIAGNOSIS — Z5181 Encounter for therapeutic drug level monitoring: Secondary | ICD-10-CM | POA: Diagnosis not present

## 2016-02-03 DIAGNOSIS — I4891 Unspecified atrial fibrillation: Secondary | ICD-10-CM | POA: Diagnosis not present

## 2016-02-03 DIAGNOSIS — D75829 Heparin-induced thrombocytopenia, unspecified: Secondary | ICD-10-CM

## 2016-02-03 LAB — PULMONARY FUNCTION TEST
DL/VA % pred: 82 %
DL/VA: 3.82 ml/min/mmHg/L
DLCO COR % PRED: 65 %
DLCO COR: 21.06 ml/min/mmHg
DLCO UNC: 20.04 ml/min/mmHg
DLCO unc % pred: 62 %
FEF 25-75 POST: 2.28 L/s
FEF 25-75 Pre: 1.67 L/sec
FEF2575-%Change-Post: 36 %
FEF2575-%PRED-POST: 77 %
FEF2575-%PRED-PRE: 56 %
FEV1-%CHANGE-POST: 7 %
FEV1-%PRED-POST: 79 %
FEV1-%Pred-Pre: 73 %
FEV1-POST: 2.84 L
FEV1-Pre: 2.65 L
FEV1FVC-%Change-Post: 7 %
FEV1FVC-%PRED-PRE: 94 %
FEV6-%Change-Post: 0 %
FEV6-%Pred-Post: 81 %
FEV6-%Pred-Pre: 80 %
FEV6-POST: 3.7 L
FEV6-Pre: 3.66 L
FEV6FVC-%Change-Post: 1 %
FEV6FVC-%PRED-POST: 105 %
FEV6FVC-%Pred-Pre: 104 %
FVC-%Change-Post: 0 %
FVC-%PRED-PRE: 78 %
FVC-%Pred-Post: 77 %
FVC-POST: 3.7 L
FVC-PRE: 3.71 L
POST FEV1/FVC RATIO: 77 %
PRE FEV1/FVC RATIO: 72 %
PRE FEV6/FVC RATIO: 99 %
Post FEV6/FVC ratio: 100 %
RV % pred: 91 %
RV: 2.06 L
TLC % PRED: 81 %
TLC: 5.72 L

## 2016-02-03 LAB — POCT INR: INR: 3.2

## 2016-02-03 NOTE — Patient Instructions (Addendum)
Medication Instructions:  Your physician recommends that you continue on your current medications as directed. Please refer to the Current Medication list given to you today.  Labwork: NONE  Testing/Procedures: Your physician has recommended that you have a pulmonary function test. Pulmonary Function Tests are a group of tests that measure how well air moves in and out of your lungs.  Follow-Up: Your physician wants you to follow-up with Dr. Excell Seltzer in February 2018 and follow-up with Dr. Elberta Fortis as already scheduled.   If you need a refill on your cardiac medications before your next appointment, please call your pharmacy.

## 2016-02-03 NOTE — Progress Notes (Signed)
PFT done today. Shonica Weier,CMA  

## 2016-02-05 ENCOUNTER — Other Ambulatory Visit: Payer: Self-pay | Admitting: Physician Assistant

## 2016-02-06 NOTE — Addendum Note (Signed)
Addended by: Drue Dun on: 02/06/2016 11:02 AM   Modules accepted: Orders

## 2016-02-09 ENCOUNTER — Telehealth: Payer: Self-pay | Admitting: Physician Assistant

## 2016-02-09 ENCOUNTER — Encounter: Payer: Self-pay | Admitting: *Deleted

## 2016-02-09 ENCOUNTER — Encounter: Payer: Self-pay | Admitting: Physician Assistant

## 2016-02-09 NOTE — Telephone Encounter (Signed)
Per Ronie Spies, PA-C, ok to extend pt being out of work until he sees Dr. Elberta Fortis 03-01-16 to see if he at that time can be cleared.  Letter written for pt to pick up at the office.  Pt verbalized appreciation and understanding.

## 2016-02-09 NOTE — Telephone Encounter (Signed)
I did not, because it sounded like the patient stated he had had a conversation with his insurance company who discussed with Dr. Excell Seltzer about this. I did not realize he was asking me to discuss it as well. I think it's totally appropriate though. Let me know if he needs anything. Dayna Dunn PA-C

## 2016-02-09 NOTE — Telephone Encounter (Signed)
New message    Pt verbalized that he is calling for the provider to extend his time out of work and to sign documents

## 2016-02-17 ENCOUNTER — Ambulatory Visit (INDEPENDENT_AMBULATORY_CARE_PROVIDER_SITE_OTHER): Payer: Managed Care, Other (non HMO) | Admitting: Pharmacist

## 2016-02-17 DIAGNOSIS — Z5181 Encounter for therapeutic drug level monitoring: Secondary | ICD-10-CM

## 2016-02-17 DIAGNOSIS — I4891 Unspecified atrial fibrillation: Secondary | ICD-10-CM | POA: Diagnosis not present

## 2016-02-17 LAB — POCT INR: INR: 2.7

## 2016-03-01 ENCOUNTER — Encounter: Payer: Self-pay | Admitting: Cardiology

## 2016-03-01 ENCOUNTER — Ambulatory Visit (INDEPENDENT_AMBULATORY_CARE_PROVIDER_SITE_OTHER): Payer: Managed Care, Other (non HMO) | Admitting: Cardiology

## 2016-03-01 VITALS — BP 136/90 | HR 74 | Ht 70.5 in | Wt 226.6 lb

## 2016-03-01 DIAGNOSIS — I4901 Ventricular fibrillation: Secondary | ICD-10-CM | POA: Diagnosis not present

## 2016-03-01 DIAGNOSIS — I469 Cardiac arrest, cause unspecified: Secondary | ICD-10-CM | POA: Diagnosis not present

## 2016-03-01 DIAGNOSIS — I472 Ventricular tachycardia: Secondary | ICD-10-CM | POA: Diagnosis not present

## 2016-03-01 DIAGNOSIS — Z95810 Presence of automatic (implantable) cardiac defibrillator: Secondary | ICD-10-CM | POA: Diagnosis not present

## 2016-03-01 DIAGNOSIS — I5021 Acute systolic (congestive) heart failure: Secondary | ICD-10-CM

## 2016-03-01 DIAGNOSIS — Z79899 Other long term (current) drug therapy: Secondary | ICD-10-CM | POA: Diagnosis not present

## 2016-03-01 DIAGNOSIS — I4729 Other ventricular tachycardia: Secondary | ICD-10-CM

## 2016-03-01 LAB — CUP PACEART INCLINIC DEVICE CHECK
HighPow Impedance: 65 Ohm
Implantable Lead Implant Date: 20170911
Implantable Lead Location: 753860
Implantable Pulse Generator Implant Date: 20170911
Lead Channel Pacing Threshold Amplitude: 0.625 V
Lead Channel Sensing Intrinsic Amplitude: 11 mV
Lead Channel Setting Sensing Sensitivity: 0.3 mV
MDC IDC MSMT BATTERY REMAINING LONGEVITY: 137 mo
MDC IDC MSMT BATTERY VOLTAGE: 3.11 V
MDC IDC MSMT LEADCHNL RV IMPEDANCE VALUE: 342 Ohm
MDC IDC MSMT LEADCHNL RV IMPEDANCE VALUE: 437 Ohm
MDC IDC MSMT LEADCHNL RV PACING THRESHOLD PULSEWIDTH: 0.4 ms
MDC IDC MSMT LEADCHNL RV SENSING INTR AMPL: 13.75 mV
MDC IDC SESS DTM: 20171211140523
MDC IDC SET LEADCHNL RV PACING AMPLITUDE: 2.5 V
MDC IDC SET LEADCHNL RV PACING PULSEWIDTH: 0.4 ms
MDC IDC STAT BRADY RV PERCENT PACED: 0.01 %

## 2016-03-01 MED ORDER — AMIODARONE HCL 400 MG PO TABS: ORAL_TABLET | ORAL | 2 refills | Status: DC

## 2016-03-01 MED ORDER — METOPROLOL SUCCINATE ER 25 MG PO TB24: 25.0000 mg | ORAL_TABLET | Freq: Every day | ORAL | 3 refills | Status: DC

## 2016-03-01 MED ORDER — SPIRONOLACTONE 25 MG PO TABS: 25.0000 mg | ORAL_TABLET | Freq: Every day | ORAL | 3 refills | Status: DC

## 2016-03-01 NOTE — Patient Instructions (Signed)
Medication Instructions:   Your physician has recommended you make the following change in your medication:  1) INCREASE Amiodarone to 400 mg once daily for the next 2 weeks, Then reduce and take 200 mg once daily 2) INCREASE Toprol to 25 mg once daily 3) INCREASE Aldactone to 25 mg once daily  --- If you need a refill on your cardiac medications before your next appointment, please call your pharmacy. ---  Labwork:  Today: LFT  Testing/Procedures: Your physician has requested that you have an echocardiogram. Echocardiography is a painless test that uses sound waves to create images of your heart. It provides your doctor with information about the size and shape of your heart and how well your heart's chambers and valves are working. This procedure takes approximately one hour. There are no restrictions for this procedure.  Follow-Up:  Your physician recommends that you schedule a follow-up appointment in: 6 weeks with Dr. Elberta Fortis  Thank you for choosing [facility]!!   Dory Horn, RN [phone]

## 2016-03-01 NOTE — Progress Notes (Addendum)
Electrophysiology Office Note   Date:  03/01/2016   ID:  Arthur Chase, DOB 1956-02-14, MRN [id number]  PCP:  Hollice Espy, MD  Cardiologist:  Excell Seltzer Primary Electrophysiologist:  Zorah Backes [name], MD    Chief Complaint  Patient presents with  . Defib Check    3 months post/VF     History of Present Illness: Arthur Chase is a 60 y.o. male who presents today for electrophysiology evaluation.   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 He was admitted 11/16/15 with cardiac arrest and EMS strips demonstrating multiple episodes of VT/VF with a cycle length ~276msec. He was successfully rescuscitated - underwent a prolonged hospital admission s/p cooling. LHC 11/16/15: normal cors, EF 55-65%. 2D Echo 11/17/15: technically difficult, mod LVH, EF 20%. F/u limited echo 11/20/15: EF 25-30%, diffuse HK, mod LAE, mildly dilated RV with moderately reduced systolic function, mild TR. CT angio 11/22/15 was negative for PE. Cardiac MRI 11/26/15: mod LVH, EF 68%, no RWMA, no delayed gadolinium uptake, no scar/infiltration of LV myocardium, normal RV size and function. Underwent ICD implantation with a Medtronic Visia AF MRI (serial  Number [id number]) ICD on 12/01/15. Was put on amiodarone. Unclear if AF was due to the above issues.   Today, he denies symptoms of palpitations, chest pain, shortness of breath, orthopnea, PND, lower extremity edema, claudication, dizziness, presyncope, syncope, bleeding, or neurologic sequela. The patient is tolerating medications without difficulties and is otherwise without complaint today.  He has had multiple episodes of VT on his device.  The most recent on  12/6. He does complain of feeling a funny feeling in his chest almost like a yawn. This happens occasionally and it is unclear if it is in relation to his episodes of SVT.   Past Medical History:  Diagnosis Date  . Anoxic encephalopathy (HCC) 10/2015   a. 10/2015 in setting of cardiac arrest - recovered prior to DC.  . Cardiac arrest with ventricular fibrillation (HCC)    a. 11/2015: Unclear etiology. Nl cors on cath 8/27. S/P cooling. On amiodarone and s/p ICD for secondary prevention on 12/01/15. EF down to 25-30% but normalized to 68 by subsequent cardiac MR  . HIT (heparin-induced thrombocytopenia) (HCC)    a. 11/2015 during admission for VF arrest. HIT panel positive. Placed on coumadin for 2 months  . HTN (hypertension)   . Hypokalemia    a. 11/2015: resolved on K supplementation and spironolactone  . ICD (implantable cardioverter-defibrillator) in place    a. Medtronic Visia AF MRI (serial  Number [id number]) ICD.  Marland Kitchen Myocardial stunning (HCC)    a. 11/2015: EF down to 20-25% after VF arrest but improved to ~68 by subsequent cardiac MR.  Marland Kitchen NICM (nonischemic cardiomyopathy) (HCC)   . Obesity   . PAF (paroxysmal atrial fibrillation) (HCC)    a. 11/2015 brief run of afib with RVR while intubated during a complicated admission for VF arrest and VDRF. No recurrence on amio. CHADSVASC 2 (CHF, HTN) and on coumadin for HIT with positive thrombotic markers. If no long term recurrence, he may be able to come off OAC  . Ventricular tachycardia (HCC) 10/2015   Past Surgical History:  Procedure Laterality Date  . CARDIAC CATHETERIZATION N/A 11/16/2015  Procedure: Left Heart Cath and Coronary Angiography;  Surgeon: Peter M Swaziland, MD;  [facility]: [facility] INVASIVE CV LAB;  Service: Cardiovascular;  Laterality: N/A;  . EP IMPLANTABLE DEVICE N/A 12/01/2015   Procedure: ICD Implant;  Surgeon: Sang Blount [name], MD;  Location: [facility] INVASIVE CV LAB;  Service: Cardiovascular;  Laterality: N/A;     Current  Outpatient Prescriptions  Medication Sig Dispense Refill  . amiodarone (PACERONE) 400 MG tablet Take 0.5 tablets (200 mg total) by mouth daily. 45 tablet 3  . losartan (COZAAR) 25 MG tablet Take 1 tablet (25 mg total) by mouth daily. 30 tablet 6  . Multiple Vitamin (MULTIVITAMIN WITH MINERALS) TABS tablet Take 1 tablet by mouth daily. Centrum Silver    . pantoprazole (PROTONIX) 40 MG tablet Take 1 tablet (40 mg total) by mouth daily. 30 tablet 6  . potassium chloride SA (K-DUR,KLOR-CON) 20 MEQ tablet Take 1 tablet (20 mEq total) by mouth 2 (two) times daily. 60 tablet 6  . warfarin (COUMADIN) 5 MG tablet Take 1 tablet (5 mg total) by mouth as directed. Take 5 mg until you are seen in the coumadin clinic for further instructions 30 tablet 3  . amiodarone (PACERONE) 400 MG tablet Take 1 tablet (400 mg total) once daily for the next 2 weeks.  Then reduce and take 1/2 tablet (200 mg total) daily. 45 tablet 2  . metoprolol succinate (TOPROL XL) 25 MG 24 hr tablet Take 1 tablet (25 mg total) by mouth daily. 90 tablet 3  . spironolactone (ALDACTONE) 25 MG tablet Take 1 tablet (25 mg total) by mouth daily. 90 tablet 3   No current facility-administered medications for this visit.     Allergies:   Heparin   Social History:  The patient  reports that he has been smoking Cigarettes.  He has never used smokeless tobacco. He reports that he does not drink alcohol or use drugs.   Family History:  The patient's family history includes Alzheimer's disease in his father; Heart attack in his mother.    ROS:  Please see the history of present illness.   Otherwise, review of systems is positive for none.   All other systems are reviewed and negative.    PHYSICAL EXAM: VS:  BP 136/90   Pulse 74   Ht 5' 10.5" (1.791 m)   Wt 226 lb 9.6 oz (102.8 kg)   BMI 32.05 kg/m  , BMI Body mass index is 32.05 kg/m. GEN: Well nourished, well developed, in no acute distress  HEENT: normal  Neck: no JVD, carotid  bruits, or masses Cardiac: RRR; no murmurs, rubs, or gallops,no edema  Respiratory:  clear to auscultation bilaterally, normal work of breathing GI: soft, nontender, nondistended, + BS MS: no deformity or atrophy  Skin: warm and dry,  device pocket is well healed Neuro:  Strength and sensation are intact Psych: euthymic mood, full affect  EKG:  EKG is ordered today. Personal review of the ekg ordered shows sinus rhythm, LAFB, nonspecific ventricular conduction delay, rate 74   Device interrogation is reviewed today in detail.  See PaceArt for details.   Recent Labs: 11/17/2015: B Natriuretic Peptide 41.7 11/18/2015: TSH 1.597 11/19/2015: ALT 43 12/03/2015: Hemoglobin 11.5; Platelets 183 12/09/2015: BUN 12; Creat 0.84; Magnesium 2.2; Potassium 4.4; Sodium 135    Lipid Panel  No results found for: CHOL, TRIG, HDL, CHOLHDL, VLDL, LDLCALC, LDLDIRECT   Wt Readings from Last 3 Encounters:  03/01/16 226 lb 9.6 oz (102.8 kg)  02/03/16 221  lb (100.2 kg)  12/09/15 212 lb 6.4 oz (96.3 kg)      Other studies Reviewed: Additional studies/ records that were reviewed today include: TTE 11/20/15, Cath 11/16/15  Review of the above records today demonstrates:  - Left ventricle: The cavity size was moderately dilated. There was   mild concentric hypertrophy. Systolic function was severely   reduced. The estimated ejection fraction was in the range of 25%   to 30%. Diffuse hypokinesis. The study is not technically   sufficient to allow evaluation of LV diastolic function. - Aortic valve: Trileaflet; normal thickness leaflets. There was no   regurgitation. - Aortic root: The aortic root was normal in size. - Mitral valve: Structurally normal valve. There was no   regurgitation. - Left atrium: The atrium was moderately dilated. - Right ventricle: The cavity size was mildly dilated. Wall   thickness was normal. Systolic function was moderately reduced. - Right atrium: Pacer wire or catheter  noted in right atrium. - Tricuspid valve: There was mild regurgitation. - Pulmonary arteries: Systolic pressure was within the normal   range. PA peak pressure: 33 mm Hg (S). - Inferior vena cava: The vessel was normal in size. - Pericardium, extracardiac: There was no pericardial effusion.   The left ventricular systolic function is normal.  LV end diastolic pressure is normal.  The left ventricular ejection fraction is 55-65% by visual estimate.   1. Normal coronary anatomy 2. Normal LV function  ASSESSMENT AND PLAN:  1. Cardiac arrest with VF - Currently on amiodarone, Aldactone, and metoprolol, and having more episodes of VT. He has been able to be paced out of this without ICD shocks. Due to his further episodes of VT, we'll increase his amiodarone 400 mg a day for 2 weeks. We'll also increase his metoprolol to 50 mg, and his Aldactone to 25 mg. We'll recheck his echocardiogram to see if he has had any changes in his ejection fraction. He may require constant amiodarone at 400 mg, or the addition of mexiletine.  2. NICM - appears euvolemic. EF improved by cMRI prior to hospital DC. Continue ARB, Toprol, spironolactone. As he has had more ventricular tachycardia, Anzal Bartnick plan for repeat echocardiogram.  3. Paroxysmal atrial fib - none seen on his device to date. Possibly reactive around the time of his hospitalization  Current medicines are reviewed at length with the patient today.   The patient does not have concerns regarding his medicines.  The following changes were made today:  Increase amiodarone to 400 mg daily for 2 weeks, increased Toprol-XL to 50 mg, increase Aldactone to 25 mg  Labs/ tests ordered today include:  Orders Placed This Encounter  Procedures  . Hepatic function panel  . EKG 12-Lead  . ECHOCARDIOGRAM COMPLETE     Disposition:   FU with Gwendola Hornaday 6 weeks  Signed, Joseline Mccampbell Jorja LoaMartin Richell Corker, MD  03/01/2016 12:35 PM     [facility] [address] [place] [address] [phone] (office) [phone] (fax)

## 2016-03-02 LAB — HEPATIC FUNCTION PANEL
ALBUMIN: 4.4 g/dL (ref 3.6–5.1)
ALK PHOS: 51 U/L (ref 40–115)
ALT: 13 U/L (ref 9–46)
AST: 20 U/L (ref 10–35)
BILIRUBIN INDIRECT: 0.3 mg/dL (ref 0.2–1.2)
BILIRUBIN TOTAL: 0.4 mg/dL (ref 0.2–1.2)
Bilirubin, Direct: 0.1 mg/dL (ref <=0.2)
Total Protein: 6.8 g/dL (ref 6.1–8.1)

## 2016-03-09 ENCOUNTER — Ambulatory Visit (INDEPENDENT_AMBULATORY_CARE_PROVIDER_SITE_OTHER): Payer: Managed Care, Other (non HMO) | Admitting: Pharmacist

## 2016-03-09 DIAGNOSIS — Z5181 Encounter for therapeutic drug level monitoring: Secondary | ICD-10-CM | POA: Diagnosis not present

## 2016-03-09 DIAGNOSIS — I4891 Unspecified atrial fibrillation: Secondary | ICD-10-CM | POA: Diagnosis not present

## 2016-03-09 LAB — POCT INR: INR: 2.2

## 2016-03-12 ENCOUNTER — Other Ambulatory Visit: Payer: Self-pay | Admitting: Physician Assistant

## 2016-03-23 ENCOUNTER — Other Ambulatory Visit: Payer: Self-pay

## 2016-03-23 ENCOUNTER — Ambulatory Visit (HOSPITAL_COMMUNITY): Payer: Managed Care, Other (non HMO) | Attending: Cardiovascular Disease

## 2016-03-23 DIAGNOSIS — I469 Cardiac arrest, cause unspecified: Secondary | ICD-10-CM

## 2016-03-23 DIAGNOSIS — I4901 Ventricular fibrillation: Secondary | ICD-10-CM | POA: Diagnosis not present

## 2016-03-23 DIAGNOSIS — I4729 Other ventricular tachycardia: Secondary | ICD-10-CM

## 2016-03-23 DIAGNOSIS — I472 Ventricular tachycardia: Secondary | ICD-10-CM | POA: Diagnosis not present

## 2016-04-06 ENCOUNTER — Other Ambulatory Visit: Payer: Self-pay

## 2016-04-06 ENCOUNTER — Telehealth: Payer: Self-pay

## 2016-04-06 ENCOUNTER — Ambulatory Visit (INDEPENDENT_AMBULATORY_CARE_PROVIDER_SITE_OTHER): Payer: Managed Care, Other (non HMO) | Admitting: *Deleted

## 2016-04-06 ENCOUNTER — Other Ambulatory Visit: Payer: Managed Care, Other (non HMO) | Admitting: *Deleted

## 2016-04-06 DIAGNOSIS — I472 Ventricular tachycardia, unspecified: Secondary | ICD-10-CM

## 2016-04-06 DIAGNOSIS — I4891 Unspecified atrial fibrillation: Secondary | ICD-10-CM

## 2016-04-06 DIAGNOSIS — Z5181 Encounter for therapeutic drug level monitoring: Secondary | ICD-10-CM

## 2016-04-06 LAB — POCT INR: INR: 2.2

## 2016-04-06 MED ORDER — AMIODARONE HCL 400 MG PO TABS: 200.0000 mg | ORAL_TABLET | Freq: Two times a day (BID) | ORAL | 2 refills | Status: DC

## 2016-04-06 MED ORDER — AMIODARONE HCL 400 MG PO TABS: 200.0000 mg | ORAL_TABLET | Freq: Every day | ORAL | 3 refills | Status: DC

## 2016-04-06 MED ORDER — AMIODARONE HCL 400 MG PO TABS: 200.0000 mg | ORAL_TABLET | Freq: Two times a day (BID) | ORAL | 3 refills | Status: DC

## 2016-04-06 NOTE — Telephone Encounter (Signed)
Called pt to discuss episode from 04/06/2015. Pt stated he was walking to the couch felt a little dizzy then blacked out on the couch and took it easy the rest of the day. Informed pt that he received a shock from his defibrillator. Pt stated that he had taken his amiodarone that morning. Informed pt that his episode would be reviewed w/ DR. Ladona Ridgel and would call back with recommendations. Pt voiced understanding.

## 2016-04-06 NOTE — Telephone Encounter (Signed)
Called pt back and informed him that Dr. Ladona Ridgel recommends for him to increase Amiodarone 200 mg from once daily to twice daily, and that when pt comes in to get INR drawn he also have a BMP and Mg drawn. Pt voiced understanding. Also informed pt of DMV driving restriction of 6 months due to him receiving appropriate therapies. Pt voiced understanding.

## 2016-04-07 LAB — BASIC METABOLIC PANEL
BUN/Creatinine Ratio: 15 (ref 10–24)
BUN: 16 mg/dL (ref 8–27)
CALCIUM: 9.6 mg/dL (ref 8.6–10.2)
CHLORIDE: 100 mmol/L (ref 96–106)
CO2: 23 mmol/L (ref 18–29)
Creatinine, Ser: 1.05 mg/dL (ref 0.76–1.27)
GFR calc non Af Amer: 77 mL/min/{1.73_m2} (ref >59)
GFR, EST AFRICAN AMERICAN: 89 mL/min/{1.73_m2} (ref >59)
GLUCOSE: 89 mg/dL (ref 65–99)
POTASSIUM: 4.3 mmol/L (ref 3.5–5.2)
Sodium: 142 mmol/L (ref 134–144)

## 2016-04-07 LAB — MAGNESIUM: Magnesium: 2.4 mg/dL — ABNORMAL HIGH (ref 1.6–2.3)

## 2016-04-15 ENCOUNTER — Ambulatory Visit (INDEPENDENT_AMBULATORY_CARE_PROVIDER_SITE_OTHER): Payer: Managed Care, Other (non HMO) | Admitting: *Deleted

## 2016-04-15 ENCOUNTER — Ambulatory Visit (INDEPENDENT_AMBULATORY_CARE_PROVIDER_SITE_OTHER): Payer: Managed Care, Other (non HMO) | Admitting: Cardiology

## 2016-04-15 ENCOUNTER — Encounter: Payer: Self-pay | Admitting: Cardiology

## 2016-04-15 ENCOUNTER — Encounter (INDEPENDENT_AMBULATORY_CARE_PROVIDER_SITE_OTHER): Payer: Self-pay

## 2016-04-15 VITALS — BP 126/82 | HR 70 | Ht 71.0 in | Wt 226.0 lb

## 2016-04-15 DIAGNOSIS — I472 Ventricular tachycardia, unspecified: Secondary | ICD-10-CM

## 2016-04-15 DIAGNOSIS — I4891 Unspecified atrial fibrillation: Secondary | ICD-10-CM | POA: Diagnosis not present

## 2016-04-15 DIAGNOSIS — I480 Paroxysmal atrial fibrillation: Secondary | ICD-10-CM | POA: Diagnosis not present

## 2016-04-15 DIAGNOSIS — Z5181 Encounter for therapeutic drug level monitoring: Secondary | ICD-10-CM | POA: Diagnosis not present

## 2016-04-15 LAB — POCT INR: INR: 1.6

## 2016-04-15 NOTE — Patient Instructions (Signed)
Medication Instructions:    Your physician recommends that you continue on your current medications as directed. Please refer to the Current Medication list given to you today.  - If you need a refill on your cardiac medications before your next appointment, please call your pharmacy.   Labwork:  None ordered  Testing/Procedures:  None ordered  Follow-Up:  Your physician recommends that you schedule a follow-up appointment in: 3 months with Dr. Camnitz.  Thank you for choosing [facility]!!   Efton Thomley, RN [phone]         

## 2016-04-15 NOTE — Progress Notes (Signed)
Electrophysiology Office Note   Date:  04/15/2016   ID:  ZARIF RATHJE, DOB [birth year], MRN [id number]  PCP:  Hollice Espy, MD  Cardiologist:  Excell Seltzer Primary Electrophysiologist:  Nelta Caudill [name], MD    Chief Complaint  Patient presents with  . DEFIB CHECK    VFib/PAF     History of Present Illness: Arthur Chase is a 61 y.o. male who presents today for electrophysiology evaluation.   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 He was admitted 11/16/15 with cardiac arrest and EMS strips demonstrating multiple episodes of VT/VF with a cycle length ~236msec. He was successfully rescuscitated - underwent a prolonged hospital admission s/p cooling. LHC 11/16/15: normal cors, EF 55-65%. 2D Echo 11/17/15: technically difficult, mod LVH, EF 20%. F/u limited echo 11/20/15: EF 25-30%, diffuse HK, mod LAE, mildly dilated RV with moderately reduced systolic function, mild TR. CT angio 11/22/15 was negative for PE. Cardiac MRI 11/26/15: mod LVH, EF 68%, no RWMA, no delayed gadolinium uptake, no scar/infiltration of LV myocardium, normal RV size and function. Underwent ICD implantation with a Medtronic Visia AF MRI (serial  Number [id number]) ICD on 12/01/15. Was put on amiodarone. Unclear if AF was due to the above issues.   Today, he denies symptoms of palpitations, chest pain, shortness of breath, orthopnea, PND, lower extremity edema, claudication, dizziness, presyncope, syncope, bleeding, or neurologic sequela. The patient is tolerating medications without difficulties. He had multiple episodes of VT 6 weeks ago. His heart failure medicines were adjusted, and his amiodarone was increased for  2 weeks. Repeat echocardiogram showed a normal ejection fraction. He had a repeat episode of ventricular tachycardia and received an ICD shock after passing out. His amiodarone was increased to 400 mg a day and he was told of the DMV driving restrictions for 6 months.   Past Medical History:  Diagnosis Date  . Anoxic encephalopathy (HCC) 10/2015   a. 10/2015 in setting of cardiac arrest - recovered prior to DC.  . Cardiac arrest with ventricular fibrillation (HCC)    a. 11/2015: Unclear etiology. Nl cors on cath 8/27. S/P cooling. On amiodarone and s/p ICD for secondary prevention on 12/01/15. EF down to 25-30% but normalized to 68 by subsequent cardiac MR  . HIT (heparin-induced thrombocytopenia) (HCC)    a. 11/2015 during admission for VF arrest. HIT panel positive. Placed on coumadin for 2 months  . HTN (hypertension)   . Hypokalemia    a. 11/2015: resolved on K supplementation and spironolactone  . ICD (implantable cardioverter-defibrillator) in place    a. Medtronic Visia AF MRI (serial  Number [id number]) ICD.  Marland Kitchen Myocardial stunning (HCC)    a. 11/2015: EF down to 20-25% after VF arrest but improved to ~68 by subsequent cardiac MR.  Marland Kitchen NICM (nonischemic cardiomyopathy) (HCC)   . Obesity   . PAF (paroxysmal atrial fibrillation) (HCC)    a. 11/2015 brief run of afib with RVR while intubated during a complicated admission for VF arrest and VDRF. No recurrence on amio. CHADSVASC 2 (CHF, HTN) and on coumadin for HIT with positive thrombotic markers. If no long term recurrence, he may be able to come off OAC  . Ventricular tachycardia (HCC) 10/2015   Past Surgical History:  Procedure  Laterality Date  . CARDIAC CATHETERIZATION N/A 11/16/2015   Procedure: Left Heart Cath and Coronary Angiography;  Surgeon: Peter M Swaziland, MD;  Location: [facility] INVASIVE CV LAB;  Service: Cardiovascular;  Laterality: N/A;  . EP IMPLANTABLE DEVICE N/A 12/01/2015   Procedure: ICD Implant;  Surgeon: Chandrika Sandles [name], MD;   Location: [facility] INVASIVE CV LAB;  Service: Cardiovascular;  Laterality: N/A;     Current Outpatient Prescriptions  Medication Sig Dispense Refill  . amiodarone (PACERONE) 400 MG tablet Take 0.5 tablets (200 mg total) by mouth 2 (two) times daily. 90 tablet 3  . losartan (COZAAR) 25 MG tablet Take 1 tablet (25 mg total) by mouth daily. 30 tablet 6  . metoprolol succinate (TOPROL XL) 25 MG 24 hr tablet Take 1 tablet (25 mg total) by mouth daily. 90 tablet 3  . Multiple Vitamin (MULTIVITAMIN WITH MINERALS) TABS tablet Take 1 tablet by mouth daily. Centrum Silver    . pantoprazole (PROTONIX) 40 MG tablet Take 1 tablet (40 mg total) by mouth daily. 30 tablet 6  . potassium chloride SA (K-DUR,KLOR-CON) 20 MEQ tablet Take 1 tablet (20 mEq total) by mouth 2 (two) times daily. 60 tablet 6  . spironolactone (ALDACTONE) 25 MG tablet Take 1 tablet (25 mg total) by mouth daily. 90 tablet 3  . warfarin (COUMADIN) 5 MG tablet Take 1 tablet (5 mg total) by mouth as directed. 40 tablet 3   No current facility-administered medications for this visit.     Allergies:   Heparin   Social History:  The patient  reports that he has been smoking Cigarettes.  He has never used smokeless tobacco. He reports that he does not drink alcohol or use drugs.   Family History:  The patient's family history includes Alzheimer's disease in his father; Heart attack in his mother.    ROS:  Please see the history of present illness.   Otherwise, review of systems is positive for DOE, dizziness, passing out.   All other systems are reviewed and negative.    PHYSICAL EXAM: VS:  BP 126/82   Pulse 70   Ht 5\' 11"  (1.803 m)   Wt 226 lb (102.5 kg)   BMI 31.52 kg/m  , BMI Body mass index is 31.52 kg/m. GEN: Well nourished, well developed, in no acute distress  HEENT: normal  Neck: no JVD, carotid bruits, or masses Cardiac: RRR; no murmurs, rubs, or gallops,no edema  Respiratory:  clear to auscultation bilaterally, normal work  of breathing GI: soft, nontender, nondistended, + BS MS: no deformity or atrophy  Skin: warm and dry,  device pocket is well healed Neuro:  Strength and sensation are intact Psych: euthymic mood, full affect  EKG:  EKG is ordered today. Personal review of the ekg ordered shows sinus rhythm, LAFB, nonspecific ventricular conduction delay, rate 70   Device interrogation is reviewed today in detail.  See PaceArt for details.   Recent Labs: 11/17/2015: B Natriuretic Peptide 41.7 11/18/2015: TSH 1.597 12/03/2015: Hemoglobin 11.5; Platelets 183 03/01/2016: ALT 13 04/06/2016: BUN 16; Creatinine, Ser 1.05; Magnesium 2.4; Potassium 4.3; Sodium 142    Lipid Panel  No results found for: CHOL, TRIG, HDL, CHOLHDL, VLDL, LDLCALC, LDLDIRECT   Wt Readings from Last 3 Encounters:  04/15/16 226 lb (102.5 kg)  03/01/16 226 lb 9.6 oz (102.8 kg)  02/03/16 221 lb (100.2 kg)      Other studies Reviewed: Additional studies/ records that were reviewed today include: TTE 11/20/15, Cath 11/16/15  Review of the  above records today demonstrates:  - Left ventricle: The cavity size was moderately dilated. There was   mild concentric hypertrophy. Systolic function was severely   reduced. The estimated ejection fraction was in the range of 25%   to 30%. Diffuse hypokinesis. The study is not technically   sufficient to allow evaluation of LV diastolic function. - Aortic valve: Trileaflet; normal thickness leaflets. There was no   regurgitation. - Aortic root: The aortic root was normal in size. - Mitral valve: Structurally normal valve. There was no   regurgitation. - Left atrium: The atrium was moderately dilated. - Right ventricle: The cavity size was mildly dilated. Wall   thickness was normal. Systolic function was moderately reduced. - Right atrium: Pacer wire or catheter noted in right atrium. - Tricuspid valve: There was mild regurgitation. - Pulmonary arteries: Systolic pressure was within the  normal   range. PA peak pressure: 33 mm Hg (S). - Inferior vena cava: The vessel was normal in size. - Pericardium, extracardiac: There was no pericardial effusion.   The left ventricular systolic function is normal.  LV end diastolic pressure is normal.  The left ventricular ejection fraction is 55-65% by visual estimate.   1. Normal coronary anatomy 2. Normal LV function  TTE 03/23/16 - Left ventricle: The cavity size was normal. There was mild   concentric hypertrophy. Systolic function was normal. The   estimated ejection fraction was in the range of 55% to 60%. Wall   motion was normal; there were no regional wall motion   abnormalities. Doppler parameters are consistent with abnormal   left ventricular relaxation (grade 1 diastolic dysfunction). - Mitral valve: Mild, late systolicprolapse, involving the anterior   leaflet. - Left atrium: The atrium was mildly dilated.  ASSESSMENT AND PLAN:  1. Cardiac arrest with VF - Currently on amiodarone, Aldactone, and metoprolol. He has had more episodes of ventricular tachycardia and VF requiring ICD shocks and quite a bit of ATP. His amiodarone was increased to 400 mg a day approximately 10 days ago. He has had no further episodes since that time. We'll therefore continue his amiodarone 400 mg twice a day. Sheron Robin also potentially start him on mexiletine in the future.  2. NICM - appears euvolemic. EF improved by cMRI prior to hospital DC. Continue ARB, Toprol, spironolactone. Recent echocardiogram shows a normal ejection fraction. If he has more VT, we'll potentially repeat the MRI versus cardiac PET.  3. Paroxysmal atrial fib - none seen on his device to date. Possibly reactive around the time of his hospitalization  Current medicines are reviewed at length with the patient today.   The patient does not have concerns regarding his medicines.  The following changes were made today:  none  Labs/ tests ordered today include:  No orders  of the defined types were placed in this encounter.    Disposition:   FU with Whalen Trompeter 3 months  Signed, Jullianna Gabor [name], MD  04/15/2016 4:01 PM     [facility] [address] [place] [address] [phone] (office) [phone] (fax)

## 2016-04-19 LAB — CUP PACEART INCLINIC DEVICE CHECK
Battery Remaining Longevity: 136 mo
Battery Voltage: 3.04 V
HIGH POWER IMPEDANCE MEASURED VALUE: 85 Ohm
Implantable Lead Location: 753860
Lead Channel Impedance Value: 494 Ohm
Lead Channel Pacing Threshold Amplitude: 0.75 V
Lead Channel Setting Pacing Amplitude: 2.5 V
Lead Channel Setting Pacing Pulse Width: 0.4 ms
Lead Channel Setting Sensing Sensitivity: 0.3 mV
MDC IDC LEAD IMPLANT DT: 20170911
MDC IDC MSMT LEADCHNL RV IMPEDANCE VALUE: 399 Ohm
MDC IDC MSMT LEADCHNL RV PACING THRESHOLD PULSEWIDTH: 0.4 ms
MDC IDC MSMT LEADCHNL RV SENSING INTR AMPL: 16.875 mV
MDC IDC PG IMPLANT DT: 20170911
MDC IDC SESS DTM: 20180125213412
MDC IDC STAT BRADY RV PERCENT PACED: 0.03 %

## 2016-04-20 ENCOUNTER — Telehealth: Payer: Self-pay | Admitting: *Deleted

## 2016-04-20 NOTE — Telephone Encounter (Signed)
Lm for pt to call back in re: Ronie Spies, PA-C's message below.

## 2016-04-20 NOTE — Telephone Encounter (Signed)
Pt returned my call.  He states that he is feeling fine.  He has been advised that we would cancel his upcoming appt with Ronie Spies, PA-C and send Dr. Earmon Phoenix RN a message to get him a f/u appt with him. Pt agreeable with this and verbalized understanding.

## 2016-04-20 NOTE — Telephone Encounter (Signed)
-----   Message from Laurann Montana, [place] sent at 04/20/2016 10:32 AM EST ----- Regarding: FW: Does he need this appointment? See message below. Please let Mr. Reisman know since he just saw Dr. Elberta Fortis he doesn't necessarily need to see me. In February he was supposed to see Dr. Excell Seltzer. This was scheduled with me, but the patient has not yet been able to be worked back into see his primary cardiologist. I think his next appointment should be with Dr. Excell Seltzer. Can you touch base with Lauren to find out if Dr. Excell Seltzer can see him sometime in 4-6 weeks? Please tell Mr. Jelen I am more than happy to see him, but want to save him a copay if there are no new concerns between now and just a few days ago when he saw Dr. Elberta Fortis. Thanks.  ----- Message ----- From: Regan Lemming, MD Sent: 04/20/2016   9:53 AM To: Laurann Montana, PA-C Subject: RE: Does he need this appointment?             Probably doesn't need to see you if you were dealing with his VT.  Thanks for checking!  ----- Message ----- From: Laurann Montana, PA-C Sent: 04/19/2016   4:41 PM To: Will [name], MD, Elliot Cousin, RMA Subject: Does he need this appointment?                 Hi Will, You've been following Mr. Web pretty closely for his recurrent VT. You just saw him on 04/15/16. He had a previously existing 6 month follow-up appointment which was intended to be with Dr. Excell Seltzer in 04/2016, but it got put on my schedule for Thursday 2/1. Looking through your note, I'm not sure that I necessarily need to see him since you just saw him a few days ago. What are your thoughts? I just hate to see him pay another copay given that he remains out of work at this time. You plan to see him again in 3 months.   Thanks, [employer]

## 2016-04-22 ENCOUNTER — Ambulatory Visit (INDEPENDENT_AMBULATORY_CARE_PROVIDER_SITE_OTHER): Payer: Managed Care, Other (non HMO) | Admitting: *Deleted

## 2016-04-22 ENCOUNTER — Ambulatory Visit: Payer: Managed Care, Other (non HMO) | Admitting: Physician Assistant

## 2016-04-22 DIAGNOSIS — I4891 Unspecified atrial fibrillation: Secondary | ICD-10-CM

## 2016-04-22 DIAGNOSIS — Z5181 Encounter for therapeutic drug level monitoring: Secondary | ICD-10-CM | POA: Diagnosis not present

## 2016-04-22 LAB — POCT INR: INR: 2.8

## 2016-04-30 ENCOUNTER — Other Ambulatory Visit: Payer: Self-pay | Admitting: Internal Medicine

## 2016-05-06 ENCOUNTER — Ambulatory Visit (INDEPENDENT_AMBULATORY_CARE_PROVIDER_SITE_OTHER): Payer: Managed Care, Other (non HMO) | Admitting: *Deleted

## 2016-05-06 DIAGNOSIS — I4891 Unspecified atrial fibrillation: Secondary | ICD-10-CM

## 2016-05-06 DIAGNOSIS — Z5181 Encounter for therapeutic drug level monitoring: Secondary | ICD-10-CM

## 2016-05-06 LAB — POCT INR: INR: 2.3

## 2016-05-20 ENCOUNTER — Ambulatory Visit (INDEPENDENT_AMBULATORY_CARE_PROVIDER_SITE_OTHER): Payer: Managed Care, Other (non HMO) | Admitting: Cardiovascular Disease

## 2016-05-20 ENCOUNTER — Encounter: Payer: Self-pay | Admitting: Cardiovascular Disease

## 2016-05-20 ENCOUNTER — Encounter (INDEPENDENT_AMBULATORY_CARE_PROVIDER_SITE_OTHER): Payer: Self-pay

## 2016-05-20 VITALS — BP 128/86 | HR 62 | Ht 71.0 in | Wt 234.8 lb

## 2016-05-20 DIAGNOSIS — I472 Ventricular tachycardia, unspecified: Secondary | ICD-10-CM

## 2016-05-20 DIAGNOSIS — I428 Other cardiomyopathies: Secondary | ICD-10-CM | POA: Diagnosis not present

## 2016-05-20 NOTE — Patient Instructions (Signed)

## 2016-05-20 NOTE — Progress Notes (Signed)
Cardiology Office Note Date:  05/23/2016   ID:  Arthur Chase, DOB 1955-09-13, MRN [id number]  PCP:  Hollice Espy, MD  Cardiologist:  Tonny Bollman, MD    No chief complaint on file.    History of Present Illness: Arthur Chase is a 61 y.o. male who presents for follow-up cardiac evaluation. He was hospitalized after cardiac arrest in August 2018 and had a long, complicated course. He initially had severe LV dysfunction, but this normalized and LVEF on follow-up cardiac MRI was 68%. He was also diagnosed with HIT and he was treated with warfarin, now discontinued.   The patient is here alone today. He is able to walk 2 laps around his neighborhood without too much trouble. He does c/o DOE with moderate exertion but has no o there complaints. Denies chest pain, chest pressure, or palpitations.   Past Medical History:  Diagnosis Date  . Anoxic encephalopathy (HCC) 10/2015   a. 10/2015 in setting of cardiac arrest - recovered prior to DC.  . Cardiac arrest with ventricular fibrillation (HCC)    a. 11/2015: Unclear etiology. Nl cors on cath 8/27. S/P cooling. On amiodarone and s/p ICD for secondary prevention on 12/01/15. EF down to 25-30% but normalized to 68 by subsequent cardiac MR  . HIT (heparin-induced thrombocytopenia) (HCC)    a. 11/2015 during admission for VF arrest. HIT panel positive. Placed on coumadin for 2 months  . HTN (hypertension)   . Hypokalemia    a. 11/2015: resolved on K supplementation and spironolactone  . ICD (implantable cardioverter-defibrillator) in place    a. Medtronic Visia AF MRI (serial  Number [id number]) ICD.  Marland Kitchen Myocardial stunning (HCC)    a. 11/2015: EF down to 20-25% after VF arrest but improved to ~68 by subsequent cardiac MR.  Marland Kitchen NICM (nonischemic cardiomyopathy) (HCC)   . Obesity   . PAF (paroxysmal atrial fibrillation) (HCC)    a. 11/2015 brief run of afib with RVR while intubated during a complicated admission for VF arrest and VDRF. No  recurrence on amio. CHADSVASC 2 (CHF, HTN) and on coumadin for HIT with positive thrombotic markers. If no long term recurrence, he may be able to come off OAC  . Ventricular tachycardia (HCC) 10/2015    Past Surgical History:  Procedure Laterality Date  . CARDIAC CATHETERIZATION N/A 11/16/2015   Procedure: Left Heart Cath and Coronary Angiography;  Surgeon: Peter M Swaziland, MD;  Location: [facility] INVASIVE CV LAB;  Service: Cardiovascular;  Laterality: N/A;  . EP IMPLANTABLE DEVICE N/A 12/01/2015   Procedure: ICD Implant;  Surgeon: Will [name], MD;  Location: [facility] INVASIVE CV LAB;  Service: Cardiovascular;  Laterality: N/A;    Current Outpatient Prescriptions  Medication Sig Dispense Refill  . amiodarone (PACERONE) 400 MG tablet Take 0.5 tablets (200 mg total) by mouth 2 (two) times daily. 90 tablet 3  . losartan (COZAAR) 25 MG tablet Take 1 tablet (25 mg total) by mouth daily. 30 tablet 6  . metoprolol succinate (TOPROL XL) 25 MG 24 hr tablet Take 1 tablet (25 mg total) by mouth daily. 90 tablet 3  . Multiple Vitamin (MULTIVITAMIN WITH MINERALS) TABS tablet Take 1 tablet by mouth daily. Centrum Silver    . pantoprazole (PROTONIX) 40 MG tablet Take 1 tablet (40 mg total) by mouth daily. 30 tablet 6  . potassium chloride SA (K-DUR,KLOR-CON) 20 MEQ tablet Take 1 tablet (20 mEq total) by mouth 2 (two) times daily. 60 tablet 6  . spironolactone (ALDACTONE) 25  MG tablet Take 1 tablet (25 mg total) by mouth daily. 90 tablet 3  . warfarin (COUMADIN) 5 MG tablet Take 1 tablet (5 mg total) by mouth as directed. 40 tablet 3   No current facility-administered medications for this visit.     Allergies:   Heparin   Social History:  The patient  reports that he has quit smoking. His smoking use included Cigarettes. He has never used smokeless tobacco. He reports that he does not drink alcohol or use drugs.   Family History:  The patient's  family history includes Alzheimer's disease in his father;  Heart attack in his mother.   ROS:  Please see the history of present illness.  Otherwise, review of systems is positive for shortness of breath.  All other systems are reviewed and negative.   PHYSICAL EXAM: VS:  BP 128/86 (BP Location: Left Arm)   Pulse 62   Ht 5\' 11"  (1.803 m)   Wt 234 lb 12.8 oz (106.5 kg)   BMI 32.75 kg/m  , BMI Body mass index is 32.75 kg/m. GEN: Well nourished, well developed, in no acute distress  HEENT: normal  Neck: no JVD, no masses. No carotid bruits Cardiac: RRR without murmur or gallop                Respiratory:  clear to auscultation bilaterally, normal work of breathing GI: soft, nontender, nondistended, + BS MS: no deformity or atrophy  Ext: no pretibial edema, pedal pulses 2+= bilaterally Skin: warm and dry, no rash Neuro:  Strength and sensation are intact Psych: euthymic mood, full affect  EKG:  EKG is not ordered today.  Recent Labs: 11/17/2015: B Natriuretic Peptide 41.7 11/18/2015: TSH 1.597 12/03/2015: Hemoglobin 11.5; Platelets 183 03/01/2016: ALT 13 04/06/2016: BUN 16; Creatinine, Ser 1.05; Magnesium 2.4; Potassium 4.3; Sodium 142   Lipid Panel  No results found for: CHOL, TRIG, HDL, CHOLHDL, VLDL, LDLCALC, LDLDIRECT    Wt Readings from Last 3 Encounters:  05/20/16 234 lb 12.8 oz (106.5 kg)  04/15/16 226 lb (102.5 kg)  03/01/16 226 lb 9.6 oz (102.8 kg)     Cardiac Studies Reviewed: 2D Echo 03-23-2016: Study Conclusions  - Left ventricle: The cavity size was normal. There was mild   concentric hypertrophy. Systolic function was normal. The   estimated ejection fraction was in the range of 55% to 60%. Wall   motion was normal; there were no regional wall motion   abnormalities. Doppler parameters are consistent with abnormal   left ventricular relaxation (grade 1 diastolic dysfunction). - Mitral valve: Mild, late systolicprolapse, involving the anterior   leaflet. - Left atrium: The atrium was mildly dilated.  Cardiac MRI  11-26-2015: IMPRESSION: 1) Moderate LVH EF 68% no RWMA;s  2) No delayed gadolinium uptake No scar/infiltration of LV myocardium  3) Normal RV size and function   ASSESSMENT AND PLAN: 1.  VT: followed by Dr Elberta Fortis. Currently on amiodarone 400 mg BID. 3 month FU planned.  2. Non-ischemic cardiomyopathy: Cardiac MRI and echo studies demonstrate normal LV/RV function, no scar. On appropriate Rx. Doesn't seem like he has had a primary cardiomyopathy. Transient LV dysfunction following OOH cardiac arrest seems more likely as he had rapid normalization of LV function. Continue same Rx.   3. PAF - no episodes identified on recent device interrogations. Remains on amiodarone and warfarin  Upcoming questions include if/when he can return to work (may need long-term disability) and whether he requires long-term anticoagulation. I am going to defer  to Dr Elberta Fortis as his primary issues are related to VT/arrhythmia. I will see him back in one year.  Current medicines are reviewed with the patient today.  The patient does not have concerns regarding medicines.  Labs/ tests ordered today include:  No orders of the defined types were placed in this encounter.   Disposition:   FU as scheduled with Dr Elberta Fortis. I will see back in one year.  Enzo Bi, MD  05/23/2016 10:48 AM    [facility] [address] [place], [place], [place]  [address] Phone: [phone]; Fax: [phone]

## 2016-05-27 ENCOUNTER — Ambulatory Visit (INDEPENDENT_AMBULATORY_CARE_PROVIDER_SITE_OTHER): Payer: Managed Care, Other (non HMO) | Admitting: *Deleted

## 2016-05-27 DIAGNOSIS — Z5181 Encounter for therapeutic drug level monitoring: Secondary | ICD-10-CM | POA: Diagnosis not present

## 2016-05-27 DIAGNOSIS — I4891 Unspecified atrial fibrillation: Secondary | ICD-10-CM | POA: Diagnosis not present

## 2016-05-27 LAB — POCT INR: INR: 2.8

## 2016-06-24 ENCOUNTER — Encounter (INDEPENDENT_AMBULATORY_CARE_PROVIDER_SITE_OTHER): Payer: Self-pay

## 2016-06-24 ENCOUNTER — Ambulatory Visit (INDEPENDENT_AMBULATORY_CARE_PROVIDER_SITE_OTHER): Payer: Managed Care, Other (non HMO) | Admitting: *Deleted

## 2016-06-24 DIAGNOSIS — I4891 Unspecified atrial fibrillation: Secondary | ICD-10-CM

## 2016-06-24 DIAGNOSIS — Z5181 Encounter for therapeutic drug level monitoring: Secondary | ICD-10-CM

## 2016-06-24 LAB — POCT INR: INR: 4.1

## 2016-06-27 ENCOUNTER — Other Ambulatory Visit: Payer: Self-pay | Admitting: Physician Assistant

## 2016-07-01 ENCOUNTER — Other Ambulatory Visit: Payer: Self-pay | Admitting: Cardiology

## 2016-07-08 ENCOUNTER — Ambulatory Visit (INDEPENDENT_AMBULATORY_CARE_PROVIDER_SITE_OTHER): Payer: Managed Care, Other (non HMO) | Admitting: Pharmacist

## 2016-07-08 DIAGNOSIS — Z5181 Encounter for therapeutic drug level monitoring: Secondary | ICD-10-CM | POA: Diagnosis not present

## 2016-07-08 DIAGNOSIS — I4891 Unspecified atrial fibrillation: Secondary | ICD-10-CM | POA: Diagnosis not present

## 2016-07-08 LAB — POCT INR: INR: 2.7

## 2016-07-19 ENCOUNTER — Encounter: Payer: Self-pay | Admitting: Cardiology

## 2016-07-19 ENCOUNTER — Ambulatory Visit (INDEPENDENT_AMBULATORY_CARE_PROVIDER_SITE_OTHER): Payer: Managed Care, Other (non HMO) | Admitting: Cardiology

## 2016-07-19 VITALS — BP 118/82 | HR 58 | Ht 71.0 in | Wt 227.0 lb

## 2016-07-19 DIAGNOSIS — I469 Cardiac arrest, cause unspecified: Secondary | ICD-10-CM | POA: Diagnosis not present

## 2016-07-19 DIAGNOSIS — I4901 Ventricular fibrillation: Secondary | ICD-10-CM | POA: Diagnosis not present

## 2016-07-19 DIAGNOSIS — I480 Paroxysmal atrial fibrillation: Secondary | ICD-10-CM

## 2016-07-19 MED ORDER — AMIODARONE HCL 200 MG PO TABS: 200.0000 mg | ORAL_TABLET | Freq: Every day | ORAL | 3 refills | Status: DC

## 2016-07-19 MED ORDER — MEXILETINE HCL 200 MG PO CAPS: 200.0000 mg | ORAL_CAPSULE | Freq: Three times a day (TID) | ORAL | 3 refills | Status: DC

## 2016-07-19 NOTE — Progress Notes (Signed)
Electrophysiology Office Note   Date:  07/19/2016   ID:  Arthur Chase, DOB [birth year], MRN [id number]  PCP:  Hollice Espy, MD  Cardiologist:  Excell Seltzer Primary Electrophysiologist:  Arthur Raver [name], MD    Chief Complaint  Patient presents with  . Defib Check    VF/PAF     History of Present Illness: Arthur Chase is a 61 y.o. male who presents today for electrophysiology evaluation.   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 He was admitted 11/16/15 with cardiac arrest and EMS strips demonstrating multiple episodes of VT/VF with a cycle length ~27msec. He was successfully rescuscitated - underwent a prolonged hospital admission s/p cooling. LHC 11/16/15: normal cors, EF 55-65%. 2D Echo 11/17/15: technically difficult, mod LVH, EF 20%. F/u limited echo 11/20/15: EF 25-30%, diffuse HK, mod LAE, mildly dilated RV with moderately reduced systolic function, mild TR. CT angio 11/22/15 was negative for PE. Cardiac MRI 11/26/15: mod LVH, EF 68%, no RWMA, no delayed gadolinium uptake, no scar/infiltration of LV myocardium, normal RV size and function. Underwent ICD implantation with a Medtronic Visia AF MRI (serial  Number [id number]) ICD on 12/01/15. Was put on amiodarone. Unclear if AF was due to the above issues. Repeat echo showed a normal ejection fraction. He had further episodes of ventricular tachycardia treated with 400 mg daily of amiodarone. He did received ICD shocks.   Since increasing his amiodarone to 200 mg twice a day, he has done well. He has not had any further ICD shocks. Interrogation of his ICD does show nonsustained episodes of ventricular tachycardia, most of which  have lasted between 5-7 beats. He did have one episode of up to 20 beats which did not require ICD intervention. He says that he has been feeling well, though does have some shortness of breath with exertion. He can use his weedeater for up to 5 minutes. Otherwise he can work in his garden and cut his lawn with a riding mower without issue. He has been trying to trout fish using a fly rod.    Past Medical History:  Diagnosis Date  . Anoxic encephalopathy (HCC) 10/2015   a. 10/2015 in setting of cardiac arrest - recovered prior to DC.  . Cardiac arrest with ventricular fibrillation (HCC)    a. 11/2015: Unclear etiology. Nl cors on cath 8/27. S/P cooling. On amiodarone and s/p ICD for secondary prevention on 12/01/15. EF down to 25-30% but normalized to 68 by subsequent cardiac MR  . HIT (heparin-induced thrombocytopenia) (HCC)    a. 11/2015 during admission for VF arrest. HIT panel positive. Placed on coumadin for 2 months  . HTN (hypertension)   . Hypokalemia    a. 11/2015: resolved on K supplementation and spironolactone  . ICD (implantable cardioverter-defibrillator) in place    a. Medtronic Visia AF MRI (serial  Number [id number]) ICD.  Marland Kitchen Myocardial stunning (HCC)    a. 11/2015: EF down to 20-25% after VF arrest but improved to ~68 by subsequent cardiac MR.  Marland Kitchen NICM (nonischemic cardiomyopathy) (HCC)   . Obesity   . PAF (paroxysmal atrial fibrillation) (HCC)    a. 11/2015 brief run of afib with RVR while intubated during a complicated admission for VF arrest and VDRF. No recurrence  on amio. CHADSVASC 2 (CHF, HTN) and on coumadin for HIT with positive thrombotic markers. If no long term recurrence, he may be able to come off OAC  . Ventricular tachycardia (HCC) 10/2015   Past Surgical History:  Procedure Laterality Date  . CARDIAC CATHETERIZATION N/A 11/16/2015   Procedure: Left Heart Cath and Coronary Angiography;  Surgeon: Peter M Swaziland, MD;  Location[facility] INVASIVE CV LAB;  Service:  Cardiovascular;  Laterality: N/A;  . EP IMPLANTABLE DEVICE N/A 12/01/2015   Procedure: ICD Implant;  Surgeon: Juliya Magill [name], MD;  Location: [facility] INVASIVE CV LAB;  Service: Cardiovascular;  Laterality: N/A;     Current Outpatient Prescriptions  Medication Sig Dispense Refill  . KLOR-CON M20 20 MEQ tablet TAKE 1 TABLET BY MOUTH TWICE A DAY 60 tablet 6  . losartan (COZAAR) 25 MG tablet TAKE 1 TABLET BY MOUTH EVERY DAY 30 tablet 6  . metoprolol succinate (TOPROL XL) 25 MG 24 hr tablet Take 1 tablet (25 mg total) by mouth daily. 90 tablet 3  . Multiple Vitamin (MULTIVITAMIN WITH MINERALS) TABS tablet Take 1 tablet by mouth daily. Centrum Silver    . pantoprazole (PROTONIX) 40 MG tablet TAKE 1 TABLET BY MOUTH EVERY DAY 30 tablet 6  . spironolactone (ALDACTONE) 25 MG tablet Take 1 tablet (25 mg total) by mouth daily. 90 tablet 3  . warfarin (COUMADIN) 5 MG tablet TAKE 1 TABLET BY MOUTH AS DIRECTED 40 tablet 3   No current facility-administered medications for this visit.     Allergies:   Heparin   Social History:  The patient  reports that he has quit smoking. His smoking use included Cigarettes. He has never used smokeless tobacco. He reports that he does not drink alcohol or use drugs.   Family History:  The patient's family history includes Alzheimer's disease in his father; Heart attack in his mother.    ROS:  Please see the history of present illness.   Otherwise, review of systems is positive for DOE.   All other systems are reviewed and negative.   PHYSICAL EXAM: VS:  BP 118/82   Pulse (!) 58   Ht 5\' 11"  (1.803 m)   Wt 227 lb (103 kg)   BMI 31.66 kg/m  , BMI Body mass index is 31.66 kg/m. GEN: Well nourished, well developed, in no acute distress  HEENT: normal  Neck: no JVD, carotid bruits, or masses Cardiac: RRR; no murmurs, rubs, or gallops,no edema  Respiratory:  clear to auscultation bilaterally, normal work of breathing GI: soft, nontender, nondistended, + BS MS: no  deformity or atrophy  Skin: warm and dry, device site well healed Neuro:  Strength and sensation are intact Psych: euthymic mood, full affect   EKG:  EKG is ordered today. Personal review of the EKG ordered shows sinus rhythm, rate 58, LBBB, nonspecific T wave changes  Personal review of the device interrogation today. Results in [facility]    Recent Labs: 11/17/2015: B Natriuretic Peptide 41.7 11/18/2015: TSH 1.597 12/03/2015: Hemoglobin 11.5; Platelets 183 03/01/2016: ALT 13 04/06/2016: BUN 16; Creatinine, Ser 1.05; Magnesium 2.4; Potassium 4.3; Sodium 142    Lipid Panel  No results found for: CHOL, TRIG, HDL, CHOLHDL, VLDL, LDLCALC, LDLDIRECT   Wt Readings from Last 3 Encounters:  07/19/16 227 lb (103 kg)  05/20/16 234 lb 12.8 oz (106.5 kg)  04/15/16 226 lb (102.5 kg)      Other studies Reviewed: Additional studies/ records that were reviewed today include: TTE 11/20/15, Cath 11/16/15  Review of the above records today demonstrates:  - Left ventricle: The cavity size was moderately dilated. There was   mild concentric hypertrophy. Systolic function was severely   reduced. The estimated ejection fraction was in the range of 25%   to 30%. Diffuse hypokinesis. The study is not technically   sufficient to allow evaluation of LV diastolic function. - Aortic valve: Trileaflet; normal thickness leaflets. There was no   regurgitation. - Aortic root: The aortic root was normal in size. - Mitral valve: Structurally normal valve. There was no   regurgitation. - Left atrium: The atrium was moderately dilated. - Right ventricle: The cavity size was mildly dilated. Wall   thickness was normal. Systolic function was moderately reduced. - Right atrium: Pacer wire or catheter noted in right atrium. - Tricuspid valve: There was mild regurgitation. - Pulmonary arteries: Systolic pressure was within the normal   range. PA peak pressure: 33 mm Hg (S). - Inferior vena cava: The vessel was  normal in size. - Pericardium, extracardiac: There was no pericardial effusion.   The left ventricular systolic function is normal.  LV end diastolic pressure is normal.  The left ventricular ejection fraction is 55-65% by visual estimate.   1. Normal coronary anatomy 2. Normal LV function  TTE 03/23/16 - Left ventricle: The cavity size was normal. There was mild   concentric hypertrophy. Systolic function was normal. The   estimated ejection fraction was in the range of 55% to 60%. Wall   motion was normal; there were no regional wall motion   abnormalities. Doppler parameters are consistent with abnormal   left ventricular relaxation (grade 1 diastolic dysfunction). - Mitral valve: Mild, late systolicprolapse, involving the anterior   leaflet. - Left atrium: The atrium was mildly dilated.  ASSESSMENT AND PLAN:  1. Cardiac arrest with VF - currently on amiodarone, Aldactone, and metoprolol. He is continuing to have short nonsustained runs of ventricular tachycardia with one 20 beat run. He is on 200 mg of amiodarone twice a day. Joshlyn Beadle decrease his amiodarone to 200 mg daily and start him on mexiletine 200 mg 3 times a day. We'll check a TSH today.  2. NICM - ejection fraction is improved. Appears euvolemic. Continue current management.  3. Paroxysmal atrial fib - none on device interrogation. Continue current management.  Current medicines are reviewed at length with the patient today.   The patient does not have concerns regarding his medicines.  The following changes were made today:    Labs/ tests ordered today include:  No orders of the defined types were placed in this encounter.    Disposition:   FU with Abiageal Blowe 3 months  Signed, Afton Mikelson [name], MD  07/19/2016 2:57 PM     [facility] [address] [place] [address] [phone] (office) [phone] (fax)

## 2016-07-19 NOTE — Patient Instructions (Signed)
Medication Instructions:    Your physician has recommended you make the following change in your medication: 1) DECREASE Amiodarone to 200 mg once daily 2) START Mexiletine 200 mg three times daily  Labwork:  None ordered  Testing/Procedures:  None ordered  Follow-Up:  Your physician recommends that you schedule a follow-up appointment in: 3 months with Dr. Elberta Fortis.  - If you need a refill on your cardiac medications before your next appointment, please call your pharmacy.   Thank you for choosing [facility]!!   Dory Horn, RN [phone]  Any Other Special Instructions Will Be Listed Below (If Applicable).  Mexiletine capsules What is this medicine? MEXILETINE (mex [place] e teen) is an antiarrhythmic agent. This medicine is used to treat irregular heart rhythm and can slow rapid heartbeats. It can help your heart to return to and maintain a normal rhythm. Because of the side effects caused by this medicine, it is usually used for heartbeat problems that may be life-threatening. This medicine may be used for other purposes; ask your health care provider or pharmacist if you have questions. COMMON BRAND NAME(S): Mexitil What should I tell my health care provider before I take this medicine? They need to know if you have any of these conditions: -liver disease -other heart problems -previous heart attack -an unusual or allergic reaction to mexiletine, other medicines, foods, dyes, or preservatives -pregnant or trying to get pregnant -breast-feeding How should I use this medicine? Take this medicine by mouth with a glass of water. Follow the directions on the prescription label. It is recommended that you take this medicine with food or an antacid. Take your doses at regular intervals. Do not take your medicine more often than directed. Do not stop taking except on the advice of your doctor or health care professional. Talk to your pediatrician regarding the use of this  medicine in children. Special care may be needed. Overdosage: If you think you have taken too much of this medicine contact a poison control center or emergency room at once. NOTE: This medicine is only for you. Do not share this medicine with others. What if I miss a dose? If you miss a dose, take it as soon as you can. If it is almost time for your next dose, take only that dose. Do not take double or extra doses. What may interact with this medicine? Do not take this medicine with any of the following medications: -dofetilide This medicine may also interact with the following medications: -caffeine -cimetidine -medicines for depression, anxiety, or psychotic disturbances -medicines to control heart rhythm -phenobarbital -phenytoin -rifampin -theophylline This list may not describe all possible interactions. Give your health care provider a list of all the medicines, herbs, non-prescription drugs, or dietary supplements you use. Also tell them if you smoke, drink alcohol, or use illegal drugs. Some items may interact with your medicine. What should I watch for while using this medicine? Your condition will be monitored closely when you first begin therapy. Often, this drug is first started in a hospital or other monitored health care setting. Once you are on maintenance therapy, visit your doctor or health care professional for regular checks on your progress. Because your condition and use of this medicine carry some risk, it is a good idea to carry an identification card, necklace or bracelet with details of your condition, medications, and doctor or health care professional. Bonita Quin may get drowsy or dizzy. Do not drive, use machinery, or do anything that needs mental  alertness until you know how this medicine affects you. Do not stand or sit up quickly, especially if you are an older patient. This reduces the risk of dizzy or fainting spells. Alcohol can make you more dizzy, increase flushing  and rapid heartbeats. Avoid alcoholic drinks. What side effects may I notice from receiving this medicine? Side effects that you should report to your doctor or health care professional as soon as possible: -allergic reactions like skin rash, itching or hives, swelling of the face, lips, or tongue -breathing problems -chest pain, continued irregular heartbeats -redness, blistering, peeling or loosening of the skin, including inside the mouth -seizures -skin rash -trembling, shaking -unusual bleeding or bruising -unusually weak or tired Side effects that usually do not require medical attention (report to your doctor or health care professional if they continue or are bothersome): -blurred vision -difficulty walking -heartburn -nausea, vomiting -nervousness -numbness, or tingling in the fingers or toes This list may not describe all possible side effects. Call your doctor for medical advice about side effects. You may report side effects to FDA at 1-800-FDA-1088. Where should I keep my medicine? Keep out of reach of children. Store at room temperature between 15 and 30 degrees C (59 and 86 degrees F). Throw away any unused medicine after the expiration date. NOTE: This sheet is a summary. It may not cover all possible information. If you have questions about this medicine, talk to your doctor, pharmacist, or health care provider.  2018 Elsevier/Gold Standard (2007-09-25 13:59:49)

## 2016-07-27 ENCOUNTER — Other Ambulatory Visit: Payer: Self-pay | Admitting: *Deleted

## 2016-07-27 MED ORDER — WARFARIN SODIUM 5 MG PO TABS: ORAL_TABLET | ORAL | 3 refills | Status: DC

## 2016-07-27 NOTE — Telephone Encounter (Signed)
Pharmacy requests a ninety day supply. 

## 2016-08-05 ENCOUNTER — Ambulatory Visit (INDEPENDENT_AMBULATORY_CARE_PROVIDER_SITE_OTHER): Payer: Managed Care, Other (non HMO) | Admitting: *Deleted

## 2016-08-05 DIAGNOSIS — I4891 Unspecified atrial fibrillation: Secondary | ICD-10-CM | POA: Diagnosis not present

## 2016-08-05 DIAGNOSIS — Z5181 Encounter for therapeutic drug level monitoring: Secondary | ICD-10-CM

## 2016-08-05 LAB — POCT INR: INR: 1.9

## 2016-08-23 ENCOUNTER — Ambulatory Visit (INDEPENDENT_AMBULATORY_CARE_PROVIDER_SITE_OTHER): Payer: Managed Care, Other (non HMO) | Admitting: *Deleted

## 2016-08-23 DIAGNOSIS — Z5181 Encounter for therapeutic drug level monitoring: Secondary | ICD-10-CM

## 2016-08-23 DIAGNOSIS — I4891 Unspecified atrial fibrillation: Secondary | ICD-10-CM

## 2016-08-23 LAB — POCT INR: INR: 2.6

## 2016-09-17 ENCOUNTER — Other Ambulatory Visit: Payer: Self-pay | Admitting: Cardiology

## 2016-09-17 MED ORDER — MEXILETINE HCL 200 MG PO CAPS: 200.0000 mg | ORAL_CAPSULE | Freq: Three times a day (TID) | ORAL | 2 refills | Status: DC

## 2016-09-20 ENCOUNTER — Ambulatory Visit (INDEPENDENT_AMBULATORY_CARE_PROVIDER_SITE_OTHER): Payer: Managed Care, Other (non HMO) | Admitting: Pharmacist

## 2016-09-20 DIAGNOSIS — I4891 Unspecified atrial fibrillation: Secondary | ICD-10-CM

## 2016-09-20 DIAGNOSIS — Z5181 Encounter for therapeutic drug level monitoring: Secondary | ICD-10-CM

## 2016-09-20 LAB — POCT INR: INR: 2.7

## 2016-09-23 ENCOUNTER — Other Ambulatory Visit: Payer: Self-pay | Admitting: Cardiology

## 2016-10-04 ENCOUNTER — Encounter: Payer: Self-pay | Admitting: *Deleted

## 2016-10-18 ENCOUNTER — Ambulatory Visit (INDEPENDENT_AMBULATORY_CARE_PROVIDER_SITE_OTHER): Payer: Managed Care, Other (non HMO) | Admitting: Cardiology

## 2016-10-18 ENCOUNTER — Encounter: Payer: Self-pay | Admitting: Cardiology

## 2016-10-18 ENCOUNTER — Ambulatory Visit (INDEPENDENT_AMBULATORY_CARE_PROVIDER_SITE_OTHER): Payer: Managed Care, Other (non HMO) | Admitting: *Deleted

## 2016-10-18 VITALS — BP 112/80 | HR 54 | Ht 71.0 in | Wt 217.8 lb

## 2016-10-18 DIAGNOSIS — I480 Paroxysmal atrial fibrillation: Secondary | ICD-10-CM

## 2016-10-18 DIAGNOSIS — I428 Other cardiomyopathies: Secondary | ICD-10-CM | POA: Diagnosis not present

## 2016-10-18 DIAGNOSIS — I472 Ventricular tachycardia, unspecified: Secondary | ICD-10-CM

## 2016-10-18 DIAGNOSIS — Z5181 Encounter for therapeutic drug level monitoring: Secondary | ICD-10-CM | POA: Diagnosis not present

## 2016-10-18 DIAGNOSIS — I10 Essential (primary) hypertension: Secondary | ICD-10-CM | POA: Diagnosis not present

## 2016-10-18 DIAGNOSIS — Z79899 Other long term (current) drug therapy: Secondary | ICD-10-CM | POA: Diagnosis not present

## 2016-10-18 DIAGNOSIS — I4891 Unspecified atrial fibrillation: Secondary | ICD-10-CM

## 2016-10-18 LAB — CUP PACEART INCLINIC DEVICE CHECK
Battery Remaining Longevity: 134 mo
HighPow Impedance: 80 Ohm
Implantable Lead Implant Date: 20170911
Implantable Lead Location: 753860
Implantable Pulse Generator Implant Date: 20170911
Lead Channel Impedance Value: 456 Ohm
Lead Channel Pacing Threshold Amplitude: 0.75 V
Lead Channel Pacing Threshold Pulse Width: 0.4 ms
Lead Channel Sensing Intrinsic Amplitude: 13.375 mV
Lead Channel Setting Pacing Pulse Width: 0.4 ms
Lead Channel Setting Sensing Sensitivity: 0.3 mV
MDC IDC MSMT BATTERY VOLTAGE: 3.02 V
MDC IDC MSMT LEADCHNL RV IMPEDANCE VALUE: 380 Ohm
MDC IDC MSMT LEADCHNL RV SENSING INTR AMPL: 11.75 mV
MDC IDC SESS DTM: 20180730121030
MDC IDC SET LEADCHNL RV PACING AMPLITUDE: 2.5 V
MDC IDC STAT BRADY RV PERCENT PACED: 0.05 %

## 2016-10-18 LAB — POCT INR: INR: 3.1

## 2016-10-18 NOTE — Progress Notes (Signed)
Electrophysiology Office Note   Date:  10/18/2016   ID:  OSMEL DYKSTRA, DOB 1956/02/29, MRN [id number]  PCP:  Shaune Pollack, MD  Cardiologist:  Excell Seltzer Primary Electrophysiologist:  Will [name], MD    Chief Complaint  Patient presents with  . Defib check    VT/PAF     History of Present Illness: Arthur Chase is a 61 y.o. male who presents today for electrophysiology evaluation.   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 He was admitted 11/16/15 with cardiac arrest and EMS strips demonstrating multiple episodes of VT/VF with a cycle length ~248msec. He was successfully rescuscitated - underwent a prolonged hospital admission s/p cooling. Underwent ICD implantation with a Medtronic ICD on 12/01/15. Was put on amiodarone. Had received ICD shocks in the past.  Was started on mexiletine at his last visit. Has tolerated well without issue. Not having any further ventricular arrhythmias.  Today, denies symptoms of palpitations, chest pain, shortness of breath, orthopnea, PND, lower extremity edema, claudication, dizziness, presyncope, syncope, bleeding, or neurologic sequela. The patient is tolerating medications without difficulties and is otherwise without complaint today.   Past Medical History:  Diagnosis Date  . Anoxic encephalopathy (HCC) 10/2015   a. 10/2015 in setting of cardiac arrest - recovered prior to DC.  . Cardiac arrest with ventricular fibrillation (HCC)    a. 11/2015: Unclear etiology. Nl cors on cath 8/27. S/P cooling. On amiodarone and s/p ICD for secondary prevention on 12/01/15. EF down to 25-30% but normalized to 68 by subsequent cardiac MR  . HIT  (heparin-induced thrombocytopenia) (HCC)    a. 11/2015 during admission for VF arrest. HIT panel positive. Placed on coumadin for 2 months  . HTN (hypertension)   . Hypokalemia    a. 11/2015: resolved on K supplementation and spironolactone  . ICD (implantable cardioverter-defibrillator) in place    a. Medtronic Visia AF MRI (serial  Number [id number]) ICD.  Marland Kitchen Myocardial stunning (HCC)    a. 11/2015: EF down to 20-25% after VF arrest but improved to ~68 by subsequent cardiac MR.  Marland Kitchen NICM (nonischemic cardiomyopathy) (HCC)   . Obesity   . PAF (paroxysmal atrial fibrillation) (HCC)    a. 11/2015 brief run of afib with RVR while intubated during a complicated admission for VF arrest and VDRF. No recurrence on amio. CHADSVASC 2 (CHF, HTN) and on coumadin for HIT with positive thrombotic markers. If no long term recurrence, he may be able to come off OAC  . Ventricular tachycardia (HCC) 10/2015   Past Surgical History:  Procedure Laterality Date  . CARDIAC CATHETERIZATION N/A 11/16/2015   Procedure: Left Heart Cath and Coronary Angiography;  Surgeon: Peter M Swaziland, MD;  Location: [facility] INVASIVE CV LAB;  Service: Cardiovascular;  Laterality: N/A;  . EP IMPLANTABLE DEVICE N/A 12/01/2015   Procedure: ICD Implant;  Surgeon: Will [name], MD;  Location: [facility] INVASIVE CV LAB;  Service: Cardiovascular;  Laterality: N/A;     Current Outpatient Prescriptions  Medication Sig Dispense Refill  . amiodarone (PACERONE) 200 MG tablet Take 1 tablet (200 mg total) by mouth daily. 90 tablet 3  . KLOR-CON M20 20 MEQ tablet TAKE 1 TABLET BY MOUTH TWICE A DAY 60 tablet 6  . losartan (  COZAAR) 25 MG tablet TAKE 1 TABLET BY MOUTH EVERY DAY 30 tablet 6  . metoprolol succinate (TOPROL XL) 25 MG 24 hr tablet Take 1 tablet (25 mg total) by mouth daily. 90 tablet 3  . mexiletine (MEXITIL) 200 MG capsule Take 1 capsule (200 mg total) by mouth 3 (three) times daily. 270 capsule 2  . Multiple Vitamin (MULTIVITAMIN WITH  MINERALS) TABS tablet Take 1 tablet by mouth daily. Centrum Silver    . pantoprazole (PROTONIX) 40 MG tablet TAKE 1 TABLET BY MOUTH EVERY DAY 30 tablet 6  . warfarin (COUMADIN) 5 MG tablet Take 1 to 1.5 tablets daily as directed 40 tablet 3  . warfarin (COUMADIN) 5 MG tablet TAKE 1 TABLET BY MOUTH AS DIRECTED BY DOCTOR 40 tablet 3  . spironolactone (ALDACTONE) 25 MG tablet Take 1 tablet (25 mg total) by mouth daily. 90 tablet 3   No current facility-administered medications for this visit.     Allergies:   Heparin   Social History:  The patient  reports that he has quit smoking. His smoking use included Cigarettes. He has never used smokeless tobacco. He reports that he does not drink alcohol or use drugs.   Family History:  The patient's family history includes Alzheimer's disease in his father; Heart attack in his mother.    ROS:  Please see the history of present illness.   Otherwise, review of systems is positive for none.   All other systems are reviewed and negative.   PHYSICAL EXAM: VS:  BP 112/80   Pulse (!) 54   Ht 5\' 11"  (1.803 m)   Wt 217 lb 12.8 oz (98.8 kg)   BMI 30.38 kg/m  , BMI Body mass index is 30.38 kg/m. GEN: Well nourished, well developed, in no acute distress  HEENT: normal  Neck: no JVD, carotid bruits, or masses Cardiac: RRR; no murmurs, rubs, or gallops,no edema  Respiratory:  clear to auscultation bilaterally, normal work of breathing GI: soft, nontender, nondistended, + BS MS: no deformity or atrophy  Skin: warm and dry, device site well healed Neuro:  Strength and sensation are intact Psych: euthymic mood, full affect  EKG:  EKG is ordered today. Personal review of the ekg ordered shows sinus rhythm, 1 degree AV block, IVCD, rate 54  Personal review of the device interrogation today. Results in Paceart   Recent Labs: 11/17/2015: B Natriuretic Peptide 41.7 11/18/2015: TSH 1.597 12/03/2015: Hemoglobin 11.5; Platelets 183 03/01/2016: ALT  13 04/06/2016: BUN 16; Creatinine, Ser 1.05; Magnesium 2.4; Potassium 4.3; Sodium 142    Lipid Panel  No results found for: CHOL, TRIG, HDL, CHOLHDL, VLDL, LDLCALC, LDLDIRECT   Wt Readings from Last 3 Encounters:  10/18/16 217 lb 12.8 oz (98.8 kg)  07/19/16 227 lb (103 kg)  05/20/16 234 lb 12.8 oz (106.5 kg)      Other studies Reviewed: Additional studies/ records that were reviewed today include: TTE 11/20/15, Cath 11/16/15  Review of the above records today demonstrates:  - Left ventricle: The cavity size was moderately dilated. There was   mild concentric hypertrophy. Systolic function was severely   reduced. The estimated ejection fraction was in the range of 25%   to 30%. Diffuse hypokinesis. The study is not technically   sufficient to allow evaluation of LV diastolic function. - Aortic valve: Trileaflet; normal thickness leaflets. There was no   regurgitation. - Aortic root: The aortic root was normal in size. - Mitral valve: Structurally normal valve. There was no  regurgitation. - Left atrium: The atrium was moderately dilated. - Right ventricle: The cavity size was mildly dilated. Wall   thickness was normal. Systolic function was moderately reduced. - Right atrium: Pacer wire or catheter noted in right atrium. - Tricuspid valve: There was mild regurgitation. - Pulmonary arteries: Systolic pressure was within the normal   range. PA peak pressure: 33 mm Hg (S). - Inferior vena cava: The vessel was normal in size. - Pericardium, extracardiac: There was no pericardial effusion.   The left ventricular systolic function is normal.  LV end diastolic pressure is normal.  The left ventricular ejection fraction is 55-65% by visual estimate.   1. Normal coronary anatomy 2. Normal LV function  TTE 03/23/16 - Left ventricle: The cavity size was normal. There was mild   concentric hypertrophy. Systolic function was normal. The   estimated ejection fraction was in the range  of 55% to 60%. Wall   motion was normal; there were no regional wall motion   abnormalities. Doppler parameters are consistent with abnormal   left ventricular relaxation (grade 1 diastolic dysfunction). - Mitral valve: Mild, late systolicprolapse, involving the anterior   leaflet. - Left atrium: The atrium was mildly dilated.  ASSESSMENT AND PLAN:  1. Cardiac arrest with VF - currently on amiodarone and mexiletine. Has had no further episodes of ventricular arrhythmias since starting mexiletine. We'll make no changes today.  2. NICM - ejection fraction is improved. No shortness of breath. Appears euvolemic. No further changes.  3. Paroxysmal atrial fib - none noted on device interrogation. Continue current management.  4. Hypertension: well controlled today. No changes at this time.  Current medicines are reviewed at length with the patient today.   The patient does not have concerns regarding his medicines.  The following changes were made today:none  Labs/ tests ordered today include:  Orders Placed This Encounter  Procedures  . TSH  . Hepatic function panel  . EKG 12-Lead     Disposition:   FU with Will Camnitz 6 months  Signed, Will [name], MD  10/18/2016 12:10 PM     [facility] [address] [place] [address] [phone] (office) [phone] (fax)

## 2016-10-18 NOTE — Patient Instructions (Signed)
Medication Instructions:  Your physician recommends that you continue on your current medications as directed. Please refer to the Current Medication list given to you today.  If you need a refill on your cardiac medications before your next appointment, please call your pharmacy.   Labwork: Amiodarone surveillance lab work today:  Thyroid & Liver function  Testing/Procedures: None ordered  Follow-Up: Remote monitoring is used to monitor your Pacemaker of ICD from home. This monitoring reduces the number of office visits required to check your device to one time per year. It allows [place] to keep an eye on the functioning of your device to ensure it is working properly. You are scheduled for a device check from home on 01/17/2017. You may send your transmission at any time that day. If you have a wireless device, the transmission will be sent automatically. After your physician reviews your transmission, you will receive a postcard with your next transmission date.  Your physician wants you to follow-up in: 6 months  with Dr. Elberta Fortis.  You will receive a reminder letter in the mail two months in advance. If you don't receive a letter, please call our office to schedule the follow-up appointment.  Thank you for choosing [facility]!!   Dory Horn, RN [phone]  Any Other Special Instructions Will Be Listed Below (If Applicable).

## 2016-10-19 LAB — HEPATIC FUNCTION PANEL
ALT: 21 IU/L (ref 0–44)
AST: 22 IU/L (ref 0–40)
Albumin: 4.7 g/dL (ref 3.6–4.8)
Alkaline Phosphatase: 65 IU/L (ref 39–117)
Bilirubin Total: 0.3 mg/dL (ref 0.0–1.2)
Bilirubin, Direct: 0.11 mg/dL (ref 0.00–0.40)
Total Protein: 7 g/dL (ref 6.0–8.5)

## 2016-10-19 LAB — TSH: TSH: 19.91 u[IU]/mL — AB (ref 0.450–4.500)

## 2016-11-15 ENCOUNTER — Ambulatory Visit (INDEPENDENT_AMBULATORY_CARE_PROVIDER_SITE_OTHER): Payer: Managed Care, Other (non HMO) | Admitting: *Deleted

## 2016-11-15 DIAGNOSIS — I4891 Unspecified atrial fibrillation: Secondary | ICD-10-CM

## 2016-11-15 DIAGNOSIS — Z5181 Encounter for therapeutic drug level monitoring: Secondary | ICD-10-CM | POA: Diagnosis not present

## 2016-11-15 LAB — POCT INR: INR: 2.2

## 2016-12-03 ENCOUNTER — Telehealth: Payer: Self-pay | Admitting: *Deleted

## 2016-12-03 NOTE — Telephone Encounter (Signed)
Followed up with pt who tells me that he was started on Synthroid and f/u TSH on 9/28. Left message w/ PCP for documentation of treatment to be faxed to our office.

## 2016-12-03 NOTE — Telephone Encounter (Signed)
-----   Message from Will [name], MD sent at 10/19/2016  4:03 PM EDT ----- TSH elevated. Would prefer to keep him on amiodarone due to VT. Workup per primary. If no other cause found, may need to supplement vs switching to sotalol.

## 2016-12-03 NOTE — Telephone Encounter (Signed)
Notes recorded by Baird Lyons, RN on 10/21/2016 at 2:42 PM EDT Reviewed results with patient who verbalized understanding. Asked pt to call his PCP to discuss this further. Advised I would follow back up in several weeks to see if cause identified. (pt understands I am on vacation next week) Will route to PCP

## 2016-12-13 ENCOUNTER — Ambulatory Visit (INDEPENDENT_AMBULATORY_CARE_PROVIDER_SITE_OTHER): Payer: Managed Care, Other (non HMO)

## 2016-12-13 DIAGNOSIS — I4891 Unspecified atrial fibrillation: Secondary | ICD-10-CM

## 2016-12-13 DIAGNOSIS — Z5181 Encounter for therapeutic drug level monitoring: Secondary | ICD-10-CM | POA: Diagnosis not present

## 2016-12-13 LAB — POCT INR: INR: 2

## 2016-12-19 ENCOUNTER — Other Ambulatory Visit: Payer: Self-pay | Admitting: Physician Assistant

## 2016-12-20 ENCOUNTER — Other Ambulatory Visit: Payer: Self-pay | Admitting: Physician Assistant

## 2016-12-22 NOTE — Telephone Encounter (Signed)
Rx refill sent to pharmacy. 

## 2016-12-27 ENCOUNTER — Other Ambulatory Visit: Payer: Self-pay | Admitting: Physician Assistant

## 2016-12-31 ENCOUNTER — Other Ambulatory Visit: Payer: Self-pay | Admitting: Cardiology

## 2017-01-10 ENCOUNTER — Ambulatory Visit (INDEPENDENT_AMBULATORY_CARE_PROVIDER_SITE_OTHER): Payer: Managed Care, Other (non HMO)

## 2017-01-10 DIAGNOSIS — Z5181 Encounter for therapeutic drug level monitoring: Secondary | ICD-10-CM | POA: Diagnosis not present

## 2017-01-10 DIAGNOSIS — I4891 Unspecified atrial fibrillation: Secondary | ICD-10-CM

## 2017-01-10 LAB — POCT INR: INR: 2.4

## 2017-01-17 ENCOUNTER — Ambulatory Visit (INDEPENDENT_AMBULATORY_CARE_PROVIDER_SITE_OTHER): Payer: Managed Care, Other (non HMO) | Admitting: *Deleted

## 2017-01-17 ENCOUNTER — Telehealth: Payer: Self-pay | Admitting: Cardiology

## 2017-01-17 DIAGNOSIS — I472 Ventricular tachycardia, unspecified: Secondary | ICD-10-CM

## 2017-01-17 DIAGNOSIS — I428 Other cardiomyopathies: Secondary | ICD-10-CM

## 2017-01-17 NOTE — Telephone Encounter (Signed)
Spoke with pt and reminded pt of remote transmission that is due today. Pt verbalized understanding.   

## 2017-01-18 LAB — CUP PACEART REMOTE DEVICE CHECK
Brady Statistic RV Percent Paced: 0.01 %
HighPow Impedance: 86 Ohm
Implantable Lead Implant Date: 20170911
Implantable Lead Location: 753860
Implantable Pulse Generator Implant Date: 20170911
Lead Channel Pacing Threshold Amplitude: 0.875 V
Lead Channel Pacing Threshold Pulse Width: 0.4 ms
Lead Channel Sensing Intrinsic Amplitude: 13 mV
Lead Channel Setting Sensing Sensitivity: 0.3 mV
MDC IDC MSMT BATTERY REMAINING LONGEVITY: 132 mo
MDC IDC MSMT BATTERY VOLTAGE: 3.02 V
MDC IDC MSMT LEADCHNL RV IMPEDANCE VALUE: 380 Ohm
MDC IDC MSMT LEADCHNL RV IMPEDANCE VALUE: 494 Ohm
MDC IDC MSMT LEADCHNL RV SENSING INTR AMPL: 13 mV
MDC IDC SESS DTM: 20181029211506
MDC IDC SET LEADCHNL RV PACING AMPLITUDE: 2.5 V
MDC IDC SET LEADCHNL RV PACING PULSEWIDTH: 0.4 ms

## 2017-01-18 NOTE — Progress Notes (Signed)
Remote ICD transmission.   

## 2017-01-25 ENCOUNTER — Encounter: Payer: Self-pay | Admitting: Cardiology

## 2017-02-21 ENCOUNTER — Ambulatory Visit (INDEPENDENT_AMBULATORY_CARE_PROVIDER_SITE_OTHER): Payer: Managed Care, Other (non HMO) | Admitting: *Deleted

## 2017-02-21 DIAGNOSIS — I4891 Unspecified atrial fibrillation: Secondary | ICD-10-CM

## 2017-02-21 DIAGNOSIS — Z5181 Encounter for therapeutic drug level monitoring: Secondary | ICD-10-CM

## 2017-02-21 LAB — POCT INR: INR: 2.3

## 2017-02-21 NOTE — Patient Instructions (Signed)
Continue taking 1.5 tablets everyday except 1 tablet on Mondays, Wednesdays and Fridays.  Recheck INR in 6 weeks.  Coumadin clinic [phone]. Keep intake of greens consistent.

## 2017-02-25 ENCOUNTER — Other Ambulatory Visit: Payer: Self-pay | Admitting: Cardiology

## 2017-03-04 ENCOUNTER — Other Ambulatory Visit: Payer: Self-pay | Admitting: Physician Assistant

## 2017-03-19 ENCOUNTER — Other Ambulatory Visit: Payer: Self-pay | Admitting: Physician Assistant

## 2017-03-21 ENCOUNTER — Other Ambulatory Visit: Payer: Self-pay | Admitting: Cardiology

## 2017-03-21 MED ORDER — AMIODARONE HCL 200 MG PO TABS: 200.0000 mg | ORAL_TABLET | Freq: Every day | ORAL | 1 refills | Status: DC

## 2017-04-04 ENCOUNTER — Ambulatory Visit (INDEPENDENT_AMBULATORY_CARE_PROVIDER_SITE_OTHER): Payer: Managed Care, Other (non HMO) | Admitting: *Deleted

## 2017-04-04 DIAGNOSIS — I4891 Unspecified atrial fibrillation: Secondary | ICD-10-CM | POA: Diagnosis not present

## 2017-04-04 DIAGNOSIS — Z5181 Encounter for therapeutic drug level monitoring: Secondary | ICD-10-CM

## 2017-04-04 LAB — POCT INR: INR: 2.2

## 2017-04-04 NOTE — Patient Instructions (Signed)
Description   Continue taking 1.5 tablets everyday except 1 tablet on Mondays, Wednesdays and Fridays.  Recheck INR in 6 weeks.  Coumadin clinic [phone]. Keep intake of greens consistent.

## 2017-04-19 ENCOUNTER — Encounter: Payer: Self-pay | Admitting: Cardiology

## 2017-04-19 ENCOUNTER — Ambulatory Visit (INDEPENDENT_AMBULATORY_CARE_PROVIDER_SITE_OTHER): Payer: Managed Care, Other (non HMO) | Admitting: Cardiology

## 2017-04-19 VITALS — BP 112/82 | HR 60 | Ht 71.0 in | Wt 230.0 lb

## 2017-04-19 DIAGNOSIS — R570 Cardiogenic shock: Secondary | ICD-10-CM | POA: Diagnosis not present

## 2017-04-19 DIAGNOSIS — I428 Other cardiomyopathies: Secondary | ICD-10-CM | POA: Diagnosis not present

## 2017-04-19 DIAGNOSIS — I10 Essential (primary) hypertension: Secondary | ICD-10-CM

## 2017-04-19 LAB — CUP PACEART INCLINIC DEVICE CHECK
Battery Remaining Longevity: 130 mo
Brady Statistic RV Percent Paced: 0.01 %
HighPow Impedance: 80 Ohm
Implantable Lead Location: 753860
Lead Channel Impedance Value: 380 Ohm
Lead Channel Impedance Value: 494 Ohm
Lead Channel Sensing Intrinsic Amplitude: 14.25 mV
Lead Channel Setting Pacing Amplitude: 2.5 V
Lead Channel Setting Pacing Pulse Width: 0.4 ms
Lead Channel Setting Sensing Sensitivity: 0.3 mV
MDC IDC LEAD IMPLANT DT: 20170911
MDC IDC MSMT BATTERY VOLTAGE: 3.01 V
MDC IDC MSMT LEADCHNL RV PACING THRESHOLD AMPLITUDE: 0.75 V
MDC IDC MSMT LEADCHNL RV PACING THRESHOLD PULSEWIDTH: 0.4 ms
MDC IDC MSMT LEADCHNL RV SENSING INTR AMPL: 11.875 mV
MDC IDC PG IMPLANT DT: 20170911
MDC IDC SESS DTM: 20190129102946

## 2017-04-19 NOTE — Progress Notes (Signed)
Electrophysiology Office Note   Date:  04/19/2017   ID:  Arthur Chase, DOB 1956/02/26, MRN [id number]  PCP:  Shaune Pollack, MD  Cardiologist:  Excell Seltzer Primary Electrophysiologist:  Skiler Tye [name], MD    Chief Complaint  Patient presents with  . Cardiac Arrest  . Follow-up     History of Present Illness: Arthur Chase is a 62 y.o. male who presents today for electrophysiology evaluation.   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 He was admitted 11/16/15 with cardiac arrest and EMS strips demonstrating multiple episodes of VT/VF with a cycle length ~256msec. He was successfully rescuscitated - underwent a prolonged hospital admission s/p cooling. Underwent ICD implantation with a Medtronic ICD on 12/01/15. Was put on amiodarone. Had received ICD shocks in the past.  Mexiletine was started at the last visit.  Today, denies symptoms of palpitations, chest pain, shortness of breath, orthopnea, PND, lower extremity edema, claudication, dizziness, presyncope, syncope, bleeding, or neurologic sequela. The patient is tolerating medications without difficulties.  He is overall feeling well.  He has had no further episodes of tachycardia on device interrogation.  Past Medical History:  Diagnosis Date  . Anoxic encephalopathy (HCC) 10/2015   a. 10/2015 in setting of cardiac arrest - recovered prior to DC.  . Cardiac arrest with ventricular fibrillation (HCC)    a. 11/2015: Unclear etiology. Nl cors on cath 8/27. S/P cooling. On amiodarone and s/p ICD for secondary prevention on 12/01/15. EF down to 25-30% but normalized to 68 by subsequent cardiac MR  . HIT (heparin-induced  thrombocytopenia) (HCC)    a. 11/2015 during admission for VF arrest. HIT panel positive. Placed on coumadin for 2 months  . HTN (hypertension)   . Hypokalemia    a. 11/2015: resolved on K supplementation and spironolactone  . ICD (implantable cardioverter-defibrillator) in place    a. Medtronic Visia AF MRI (serial  Number [id number]) ICD.  Marland Kitchen Myocardial stunning (HCC)    a. 11/2015: EF down to 20-25% after VF arrest but improved to ~68 by subsequent cardiac MR.  Marland Kitchen NICM (nonischemic cardiomyopathy) (HCC)   . Obesity   . PAF (paroxysmal atrial fibrillation) (HCC)    a. 11/2015 brief run of afib with RVR while intubated during a complicated admission for VF arrest and VDRF. No recurrence on amio. CHADSVASC 2 (CHF, HTN) and on coumadin for HIT with positive thrombotic markers. If no long term recurrence, he may be able to come off OAC  . Ventricular tachycardia (HCC) 10/2015   Past Surgical History:  Procedure Laterality Date  . CARDIAC CATHETERIZATION N/A 11/16/2015   Procedure: Left Heart Cath and Coronary Angiography;  Surgeon: Peter M Swaziland, MD;  Location: [facility] INVASIVE CV LAB;  Service: Cardiovascular;  Laterality: N/A;  . EP IMPLANTABLE DEVICE N/A 12/01/2015   Procedure: ICD Implant;  Surgeon: Tannisha Kennington [name], MD;  Location: [facility] INVASIVE CV LAB;  Service: Cardiovascular;  Laterality: N/A;     Current Outpatient Medications  Medication Sig Dispense Refill  . amiodarone (PACERONE) 200 MG tablet Take 1 tablet (200 mg total) by mouth daily. Please make yearly appt with Dr. Elberta Fortis for July before anymore refills. 1st attempt 90 tablet 1  . KLOR-CON M20 20 MEQ tablet TAKE 1  TABLET BY MOUTH TWICE A DAY 60 tablet 6  . levothyroxine (SYNTHROID, LEVOTHROID) 100 MCG tablet Take 100 mcg by mouth daily before breakfast.  1  . losartan (COZAAR) 25 MG tablet TAKE 1 TABLET BY MOUTH EVERY DAY 30 tablet 5  . metoprolol succinate (TOPROL-XL) 25 MG 24 hr tablet Take 1 tablet (25 mg total) by mouth daily.  90 tablet 2  . mexiletine (MEXITIL) 200 MG capsule Take 1 capsule (200 mg total) by mouth 3 (three) times daily. 270 capsule 2  . Multiple Vitamin (MULTIVITAMIN WITH MINERALS) TABS tablet Take 1 tablet by mouth daily. Centrum Silver    . pantoprazole (PROTONIX) 40 MG tablet TAKE 1 TABLET BY MOUTH EVERY DAY 30 tablet 2  . warfarin (COUMADIN) 5 MG tablet Take 1 to 1.5 tablets daily as directed 40 tablet 3  . warfarin (COUMADIN) 5 MG tablet TAKE 1 TABLET BY MOUTH AS DIRECTED BY DOCTOR 40 tablet 3  . warfarin (COUMADIN) 5 MG tablet TAKE 1 TABLET BY MOUTH AS DIRECTED BY DOCTOR 40 tablet 3  . spironolactone (ALDACTONE) 25 MG tablet Take 1 tablet (25 mg total) by mouth daily. 90 tablet 3   No current facility-administered medications for this visit.     Allergies:   Heparin   Social History:  The patient  reports that he has quit smoking. His smoking use included cigarettes. he has never used smokeless tobacco. He reports that he does not drink alcohol or use drugs.   Family History:  The patient's family history includes Alzheimer's disease in his father; Heart attack in his mother.    ROS:  Please see the history of present illness.   Otherwise, review of systems is positive for none.   All other systems are reviewed and negative.   PHYSICAL EXAM: VS:  BP 112/82   Pulse 60   Ht 5\' 11"  (1.803 m)   Wt 230 lb (104.3 kg)   BMI 32.08 kg/m  , BMI Body mass index is 32.08 kg/m. GEN: Well nourished, well developed, in no acute distress  HEENT: normal  Neck: no JVD, carotid bruits, or masses Cardiac: RRR; no murmurs, rubs, or gallops,no edema  Respiratory:  clear to auscultation bilaterally, normal work of breathing GI: soft, nontender, nondistended, + BS MS: no deformity or atrophy  Skin: warm and dry, device site well healed Neuro:  Strength and sensation are intact Psych: euthymic mood, full affect  EKG:  EKG is ordered today. Personal review of the ekg ordered shows SR, 1AVB,  LAFB  Personal review of the device interrogation today. Results in Paceart   Recent Labs: 10/18/2016: ALT 21; TSH 19.910    Lipid Panel  No results found for: CHOL, TRIG, HDL, CHOLHDL, VLDL, LDLCALC, LDLDIRECT   Wt Readings from Last 3 Encounters:  04/19/17 230 lb (104.3 kg)  10/18/16 217 lb 12.8 oz (98.8 kg)  07/19/16 227 lb (103 kg)      Other studies Reviewed: Additional studies/ records that were reviewed today include: TTE 11/20/15, Cath 11/16/15  Review of the above records today demonstrates:  - Left ventricle: The cavity size was moderately dilated. There was   mild concentric hypertrophy. Systolic function was severely   reduced. The estimated ejection fraction was in the range of 25%   to 30%. Diffuse hypokinesis. The study is not technically   sufficient to allow evaluation of LV diastolic function. - Aortic valve: Trileaflet; normal thickness leaflets. There was no   regurgitation. - Aortic root: The aortic root  was normal in size. - Mitral valve: Structurally normal valve. There was no   regurgitation. - Left atrium: The atrium was moderately dilated. - Right ventricle: The cavity size was mildly dilated. Wall   thickness was normal. Systolic function was moderately reduced. - Right atrium: Pacer wire or catheter noted in right atrium. - Tricuspid valve: There was mild regurgitation. - Pulmonary arteries: Systolic pressure was within the normal   range. PA peak pressure: 33 mm Hg (S). - Inferior vena cava: The vessel was normal in size. - Pericardium, extracardiac: There was no pericardial effusion.   The left ventricular systolic function is normal.  LV end diastolic pressure is normal.  The left ventricular ejection fraction is 55-65% by visual estimate.   1. Normal coronary anatomy 2. Normal LV function  TTE 03/23/16 - Left ventricle: The cavity size was normal. There was mild   concentric hypertrophy. Systolic function was normal. The   estimated  ejection fraction was in the range of 55% to 60%. Wall   motion was normal; there were no regional wall motion   abnormalities. Doppler parameters are consistent with abnormal   left ventricular relaxation (grade 1 diastolic dysfunction). - Mitral valve: Mild, late systolicprolapse, involving the anterior   leaflet. - Left atrium: The atrium was mildly dilated.  ASSESSMENT AND PLAN:  1. Cardiac arrest with VF -currently on amiodarone and mexiletine.  He has had no further episodes since starting mexiletine.  He is tolerating this well.  No changes at this time.  Would like to come off of amiodarone at some point, but Arthur Chase continue at this time.  2. NICM -ejection fraction is since improved.  Is not having shortness of breath.  Appears euvolemic.  No changes.  3. Paroxysmal atrial fib -none noted on device interrogation.  Continue current management.  4. Hypertension: Currently well controlled.  No changes.  Current medicines are reviewed at length with the patient today.   The patient does not have concerns regarding his medicines.  The following changes were made today: None   Labs/ tests ordered today include:  Orders Placed This Encounter  Procedures  . EKG 12-Lead     Disposition:   FU with Arthur Chase 6 months  Signed, Bonna Steury [name], MD  04/19/2017 10:22 AM     [facility] [address] [place] [address] [phone] (office) [phone] (fax)

## 2017-04-19 NOTE — Patient Instructions (Addendum)
Medication Instructions:  Your physician recommends that you continue on your current medications as directed. Please refer to the Current Medication list given to you today.  * If you need a refill on your cardiac medications before your next appointment, please call your pharmacy. *  Labwork: None ordered  Testing/Procedures: None ordered  Follow-Up: Remote monitoring is used to monitor your Pacemaker of ICD from home. This monitoring reduces the number of office visits required to check your device to one time per year. It allows [place] to keep an eye on the functioning of your device to ensure it is working properly. You are scheduled for a device check from home on 05/11/2017. You may send your transmission at any time that day. If you have a wireless device, the transmission will be sent automatically. After your physician reviews your transmission, you will receive a postcard with your next transmission date.  Your physician wants you to follow-up in: 6 months with Dr. Elberta Fortis.  You will receive a reminder letter in the mail two months in advance. If you don't receive a letter, please call our office to schedule the follow-up appointment.  Thank you for choosing [facility]!!   Dory Horn, RN [phone]

## 2017-05-11 ENCOUNTER — Ambulatory Visit (INDEPENDENT_AMBULATORY_CARE_PROVIDER_SITE_OTHER): Payer: Managed Care, Other (non HMO) | Admitting: *Deleted

## 2017-05-11 DIAGNOSIS — I428 Other cardiomyopathies: Secondary | ICD-10-CM

## 2017-05-11 DIAGNOSIS — I5021 Acute systolic (congestive) heart failure: Secondary | ICD-10-CM

## 2017-05-11 NOTE — Progress Notes (Signed)
Remote ICD transmission.   

## 2017-05-12 ENCOUNTER — Encounter: Payer: Self-pay | Admitting: Cardiology

## 2017-05-16 ENCOUNTER — Ambulatory Visit (INDEPENDENT_AMBULATORY_CARE_PROVIDER_SITE_OTHER): Payer: Managed Care, Other (non HMO) | Admitting: *Deleted

## 2017-05-16 DIAGNOSIS — Z5181 Encounter for therapeutic drug level monitoring: Secondary | ICD-10-CM | POA: Diagnosis not present

## 2017-05-16 DIAGNOSIS — I4891 Unspecified atrial fibrillation: Secondary | ICD-10-CM | POA: Diagnosis not present

## 2017-05-16 LAB — POCT INR: INR: 2.9

## 2017-05-16 NOTE — Patient Instructions (Addendum)
Description   Continue taking 1.5 tablets everyday except 1 tablet on Mondays, Wednesdays and Fridays.  Recheck INR in 4 weeks with MD appt (6 weeks).  Coumadin clinic [phone]. Keep intake of greens consistent.

## 2017-05-18 LAB — CUP PACEART REMOTE DEVICE CHECK
Battery Remaining Longevity: 130 mo
Battery Voltage: 3.02 V
HighPow Impedance: 74 Ohm
Implantable Lead Implant Date: 20170911
Implantable Pulse Generator Implant Date: 20170911
Lead Channel Pacing Threshold Amplitude: 0.875 V
Lead Channel Pacing Threshold Pulse Width: 0.4 ms
Lead Channel Setting Pacing Amplitude: 2.5 V
Lead Channel Setting Pacing Pulse Width: 0.4 ms
Lead Channel Setting Sensing Sensitivity: 0.3 mV
MDC IDC LEAD LOCATION: 753860
MDC IDC MSMT LEADCHNL RV IMPEDANCE VALUE: 342 Ohm
MDC IDC MSMT LEADCHNL RV IMPEDANCE VALUE: 456 Ohm
MDC IDC MSMT LEADCHNL RV SENSING INTR AMPL: 10 mV
MDC IDC MSMT LEADCHNL RV SENSING INTR AMPL: 10 mV
MDC IDC SESS DTM: 20190220093726
MDC IDC STAT BRADY RV PERCENT PACED: 0.01 %

## 2017-05-23 ENCOUNTER — Other Ambulatory Visit: Payer: Self-pay | Admitting: Cardiology

## 2017-05-30 ENCOUNTER — Other Ambulatory Visit: Payer: Self-pay | Admitting: Physician Assistant

## 2017-06-13 ENCOUNTER — Ambulatory Visit (INDEPENDENT_AMBULATORY_CARE_PROVIDER_SITE_OTHER): Payer: Managed Care, Other (non HMO) | Admitting: Pharmacist

## 2017-06-13 ENCOUNTER — Ambulatory Visit (INDEPENDENT_AMBULATORY_CARE_PROVIDER_SITE_OTHER): Payer: Managed Care, Other (non HMO) | Admitting: Cardiovascular Disease

## 2017-06-13 ENCOUNTER — Encounter: Payer: Self-pay | Admitting: Cardiovascular Disease

## 2017-06-13 VITALS — BP 112/78 | HR 73 | Ht 71.0 in | Wt 228.0 lb

## 2017-06-13 DIAGNOSIS — Z5181 Encounter for therapeutic drug level monitoring: Secondary | ICD-10-CM | POA: Diagnosis not present

## 2017-06-13 DIAGNOSIS — I428 Other cardiomyopathies: Secondary | ICD-10-CM

## 2017-06-13 DIAGNOSIS — I4891 Unspecified atrial fibrillation: Secondary | ICD-10-CM

## 2017-06-13 LAB — POCT INR: INR: 3.4

## 2017-06-13 NOTE — Patient Instructions (Signed)
Medication Instructions:  Your provider recommends that you continue on your current medications as directed. Please refer to the Current Medication list given to you today.    Labwork: None  Testing/Procedures: Your provider has requested that you have an echocardiogram in 1 year. Echocardiography is a painless test that uses sound waves to create images of your heart. It provides your doctor with information about the size and shape of your heart and how well your heart's chambers and valves are working. This procedure takes approximately one hour. There are no restrictions for this procedure.  Follow-Up: Your provider wants you to follow-up in: 1 year with Dr. Cooper. You will receive a reminder letter in the mail two months in advance. If you don't receive a letter, please call our office to schedule the follow-up appointment.    Any Other Special Instructions Will Be Listed Below (If Applicable).     If you need a refill on your cardiac medications before your next appointment, please call your pharmacy.   

## 2017-06-13 NOTE — Patient Instructions (Signed)
Description   Skip your Coumadin today then continue taking 1.5 tablets everyday except 1 tablet on Mondays, Wednesdays and Fridays.  Recheck INR in 4 weeks with MD appt.  Coumadin clinic [phone]. Keep intake of greens consistent.

## 2017-06-13 NOTE — Progress Notes (Signed)
Cardiology Office Note Date:  06/15/2017   ID:  Arthur Chase, DOB Mar 20, 1956, MRN [id number]  PCP:  Shaune Pollack, MD  Cardiologist:  Tonny Bollman, MD    Chief Complaint  Patient presents with  . Follow-up    Cardiomyopathy     History of Present Illness: Arthur Chase is a 62 y.o. male who presents for follow-up evaluation.  The patient has a history of a cardiac arrest in August 2018.  He had a prolonged, complex hospital course and ultimately recovered.  He initially had severe LV systolic dysfunction which normalized on follow-up imaging studies.  He has been followed closely by Dr. Elberta Fortis who implanted an ICD and the patient in 2017.  During the patient's index hospitalization he was found to have normal coronary arteries.  I saw the patient last one year ago.  He is here alone today. He's back to working full time. States he feels quite tired by the end of the day. He is a Engineer, water for [employer], sometimes doing hard work. He tries to 'let the younger guys' do most of the physical labor. He denies chest pain, chest pressure, or heart palpitations.    Past Medical History:  Diagnosis Date  . Anoxic encephalopathy (HCC) 10/2015   a. 10/2015 in setting of cardiac arrest - recovered prior to DC.  . Cardiac arrest with ventricular fibrillation (HCC)    a. 11/2015: Unclear etiology. Nl cors on cath 8/27. S/P cooling. On amiodarone and s/p ICD for secondary prevention on 12/01/15. EF down to 25-30% but normalized to 68 by subsequent cardiac MR  . HIT (heparin-induced thrombocytopenia) (HCC)    a. 11/2015 during admission for VF arrest. HIT panel positive. Placed on coumadin for 2 months  . HTN (hypertension)   . Hypokalemia    a. 11/2015: resolved on K supplementation and spironolactone  . ICD (implantable cardioverter-defibrillator) in place    a. Medtronic Visia AF MRI (serial  Number [id number]) ICD.  Marland Kitchen Myocardial stunning (HCC)    a. 11/2015: EF down to 20-25% after VF  arrest but improved to ~68 by subsequent cardiac MR.  Marland Kitchen NICM (nonischemic cardiomyopathy) (HCC)   . Obesity   . PAF (paroxysmal atrial fibrillation) (HCC)    a. 11/2015 brief run of afib with RVR while intubated during a complicated admission for VF arrest and VDRF. No recurrence on amio. CHADSVASC 2 (CHF, HTN) and on coumadin for HIT with positive thrombotic markers. If no long term recurrence, he may be able to come off OAC  . Ventricular tachycardia (HCC) 10/2015    Past Surgical History:  Procedure Laterality Date  . CARDIAC CATHETERIZATION N/A 11/16/2015   Procedure: Left Heart Cath and Coronary Angiography;  Surgeon: Peter M Swaziland, MD;  Location: [facility] INVASIVE CV LAB;  Service: Cardiovascular;  Laterality: N/A;  . EP IMPLANTABLE DEVICE N/A 12/01/2015   Procedure: ICD Implant;  Surgeon: Will [name], MD;  Location: [facility] INVASIVE CV LAB;  Service: Cardiovascular;  Laterality: N/A;    Current Outpatient Medications  Medication Sig Dispense Refill  . amiodarone (PACERONE) 200 MG tablet Take 1 tablet (200 mg total) by mouth daily. Please make yearly appt with Dr. Elberta Fortis for July before anymore refills. 1st attempt 90 tablet 1  . KLOR-CON M20 20 MEQ tablet TAKE 1 TABLET BY MOUTH TWICE A DAY 60 tablet 6  . levothyroxine (SYNTHROID, LEVOTHROID) 112 MCG tablet Take 112 mcg by mouth daily before breakfast.    . losartan (COZAAR) 25  MG tablet TAKE 1 TABLET BY MOUTH EVERY DAY 30 tablet 5  . metoprolol succinate (TOPROL-XL) 25 MG 24 hr tablet Take 1 tablet (25 mg total) by mouth daily. 90 tablet 2  . mexiletine (MEXITIL) 200 MG capsule Take 1 capsule (200 mg total) by mouth 3 (three) times daily. 270 capsule 2  . Multiple Vitamin (MULTIVITAMIN WITH MINERALS) TABS tablet Take 1 tablet by mouth daily. Centrum Silver    . pantoprazole (PROTONIX) 40 MG tablet TAKE 1 TABLET BY MOUTH EVERY DAY 30 tablet 8  . spironolactone (ALDACTONE) 25 MG tablet TAKE 1 TABLET BY MOUTH EVERY DAY 90 tablet 1  .  warfarin (COUMADIN) 5 MG tablet Take 1 to 1.5 tablets daily as directed 40 tablet 3  . warfarin (COUMADIN) 5 MG tablet TAKE 1 TABLET BY MOUTH AS DIRECTED BY DOCTOR 40 tablet 3  . warfarin (COUMADIN) 5 MG tablet TAKE 1 TABLET BY MOUTH AS DIRECTED BY DOCTOR 40 tablet 3   No current facility-administered medications for this visit.     Allergies:   Heparin   Social History:  The patient  reports that he has quit smoking. His smoking use included cigarettes. He has never used smokeless tobacco. He reports that he does not drink alcohol or use drugs.   Family History:  The patient's  family history includes Alzheimer's disease in his father; Heart attack in his mother.    ROS:  Please see the history of present illness.  Otherwise, review of systems is positive for seasonal allergies.  All other systems are reviewed and negative.    PHYSICAL EXAM: VS:  BP 112/78   Pulse 73   Ht 5\' 11"  (1.803 m)   Wt 228 lb (103.4 kg)   SpO2 95%   BMI 31.80 kg/m  , BMI Body mass index is 31.8 kg/m. GEN: Well nourished, well developed, in no acute distress  HEENT: normal  Neck: no JVD, no masses. No carotid bruits Cardiac: RRR without murmur or gallop      Respiratory:  clear to auscultation bilaterally, normal work of breathing GI: soft, nontender, nondistended, + BS MS: no deformity or atrophy  Ext: no pretibial edema, pedal pulses 2+= bilaterally Skin: warm and dry, no rash Neuro:  Strength and sensation are intact Psych: euthymic mood, full affect  EKG:  EKG is not ordered today.  Recent Labs: 10/18/2016: ALT 21; TSH 19.910   Lipid Panel  No results found for: CHOL, TRIG, HDL, CHOLHDL, VLDL, LDLCALC, LDLDIRECT    Wt Readings from Last 3 Encounters:  06/13/17 228 lb (103.4 kg)  04/19/17 230 lb (104.3 kg)  10/18/16 217 lb 12.8 oz (98.8 kg)     Cardiac Studies Reviewed: Echo 03-23-2016: Left ventricle:  The cavity size was normal. There was mild concentric hypertrophy. Systolic function  was normal. The estimated ejection fraction was in the range of 55% to 60%. Wall motion was normal; there were no regional wall motion abnormalities. Early diastolic septal annular tissue Doppler velocities Ea were abnormal. Doppler parameters are consistent with abnormal left ventricular relaxation (grade 1 diastolic dysfunction). There was no evidence of elevated ventricular filling pressure by Doppler parameters.  ------------------------------------------------------------------- Aortic valve:   Structurally normal valve. Trileaflet. Cusp separation was normal.  Doppler:  Transvalvular velocity was within the normal range. There was no stenosis. There was no regurgitation.  ------------------------------------------------------------------- Aorta:  Aortic root: The aortic root was normal in size. Ascending aorta: The ascending aorta was normal in size.  ------------------------------------------------------------------- Mitral valve:  Mildly thickened leaflets .  Mild, late systolicprolapse, involving the anterior leaflet.  Doppler:  There was no significant regurgitation.  ------------------------------------------------------------------- Left atrium:  The atrium was mildly dilated.  ------------------------------------------------------------------- Right ventricle:  The cavity size was normal. Pacer wire or catheter noted in right ventricle. Systolic function was normal.   ------------------------------------------------------------------- Pulmonic valve:   Poorly visualized.  The valve appears to be grossly normal.   Cusp separation was normal.  Doppler: Transvalvular velocity was within the normal range. There was no regurgitation.  ------------------------------------------------------------------- Tricuspid valve:   Structurally normal valve.   Leaflet separation was normal.  Doppler:  Transvalvular velocity was within the normal range. There was trivial  regurgitation.  ------------------------------------------------------------------- Pulmonary artery:   Systolic pressure was within the normal range.   ------------------------------------------------------------------- Right atrium:  The atrium was normal in size. Pacer wire or catheter noted in right atrium.  ------------------------------------------------------------------- Pericardium:  There was no pericardial effusion.  Cardiac MRI 11-26-2015: FINDINGS: All 4 cardiac chambers were normal in size and function. There was no ASD/VSD or pericardial effusion. The septum was 14 mm with no evidence of HOCM or SAM. The quantitative EF was 68% (EDV 130 cc ESV 41 cc SV 89 cc) Delayed enhancement showed no scar, infiltration or infarct. RV size and function were normal Dysplasia sequences not performed  IMPRESSION: 1) Moderate LVH EF 68% no RWMA;s  2) No delayed gadolinium uptake No scar/infiltration of LV myocardium  3) Normal RV size and function   ASSESSMENT AND PLAN: 1.  Paroxysmal atrial fibrillation: Appears to be maintaining sinus rhythm.  Device checks are done regularly and he is followed by Dr. Elberta Fortis.  He continues on warfarin for anticoagulation.  2. Amiodarone: thyroid being followed by PCP, adjustments in levothyroxine recently made and I reviewed his recent labs demonstrating a TSH in normal range at 4.16.  3. HTN: BP well controlled.  Medical program is reviewed and no changes are made today.  4. Cardiomyopathy: normalized LV function on most recent imaging studies. Echo and CMR results reviewed. Continue losartan, Toprol, and aldactone.  Recommend a follow-up echocardiogram prior to his office visit next year.  Current medicines are reviewed with the patient today.  The patient does not have concerns regarding medicines.  Labs/ tests ordered today include:   Orders Placed This Encounter  Procedures  . ECHOCARDIOGRAM COMPLETE    Disposition:   FU one  year  Signed, Tonny Bollman, MD  06/15/2017 2:39 PM    [facility] [address] [place], [place], [place]  [address] Phone: [phone]; Fax: [phone]

## 2017-06-26 ENCOUNTER — Other Ambulatory Visit: Payer: Self-pay | Admitting: Physician Assistant

## 2017-06-27 ENCOUNTER — Other Ambulatory Visit: Payer: Self-pay | Admitting: *Deleted

## 2017-06-27 ENCOUNTER — Other Ambulatory Visit: Payer: Self-pay | Admitting: Cardiology

## 2017-07-07 ENCOUNTER — Other Ambulatory Visit: Payer: Self-pay | Admitting: Cardiology

## 2017-07-11 ENCOUNTER — Ambulatory Visit (INDEPENDENT_AMBULATORY_CARE_PROVIDER_SITE_OTHER): Payer: Managed Care, Other (non HMO) | Admitting: Pharmacist

## 2017-07-11 DIAGNOSIS — I4891 Unspecified atrial fibrillation: Secondary | ICD-10-CM

## 2017-07-11 DIAGNOSIS — Z5181 Encounter for therapeutic drug level monitoring: Secondary | ICD-10-CM

## 2017-07-11 LAB — POCT INR: INR: 2.9

## 2017-07-11 NOTE — Patient Instructions (Signed)
Description   Continue taking 1.5 tablets everyday except 1 tablet on Mondays, Wednesdays and Fridays.  Recheck INR in 4 weeks.  Coumadin clinic [phone]. Keep intake of greens consistent.

## 2017-07-13 ENCOUNTER — Telehealth: Payer: Self-pay | Admitting: Cardiology

## 2017-07-13 MED ORDER — MEXILETINE HCL 200 MG PO CAPS: 200.0000 mg | ORAL_CAPSULE | Freq: Two times a day (BID) | ORAL | 3 refills | Status: DC

## 2017-07-13 NOTE — Telephone Encounter (Signed)
Pt reports Mexiletine costing him almost $500 q90d for drug. Pharmacy reports pt paying $453.36. Pt aware I will look into options and send to our prior auth dept to check on options. He understands I will call him by the end of the week. He currently has about 8 days left of medication.

## 2017-07-13 NOTE — Telephone Encounter (Signed)
Advised pt to decrease to BID, per Dr. Elberta Fortis.  Pt agreeable to plan.  Medication list updated (Rx not sent to pharmacy).

## 2017-07-13 NOTE — Telephone Encounter (Signed)
New Message:      Pt c/o medication issue:  1. Name of Medication: mexiletine (MEXITIL) 200 MG capsule  2. How are you currently taking this medication (dosage and times per day)? Take 1 capsule (200 mg total) by mouth 3 (three) times daily  3. Are you having a reaction (difficulty breathing--STAT)? No  4. What is your medication issue? Pt is calling due to the expense of this medication. Pt states he was told to call when he was almost out if the same situation occurred as the first time.

## 2017-07-15 ENCOUNTER — Other Ambulatory Visit: Payer: Self-pay

## 2017-07-15 MED ORDER — MEXILETINE HCL 200 MG PO CAPS: 200.0000 mg | ORAL_CAPSULE | Freq: Two times a day (BID) | ORAL | 3 refills | Status: DC

## 2017-07-15 NOTE — Telephone Encounter (Signed)
**Note De-Identified Arthur Chase Obfuscation** I sent the pts new Mexiletine dose to the pts pharmacy, [facility], per request from the pharmacist there so we can see if a PA is required for this RX.  Per the pharmacist the RX is going through without a PA requirement but a 90 day supply is still $302 for a 90 day supply. The pharmacist recommends that the pt contact his insurance company to see if he has a high deductible and if not to ask why Mexiletine is costing so much.  I called the pt and he stated that he does have a high deductible but that is not effecting the cost of any of his other medications. He also states that he has downloaded a prescription savings card to his cell phone and that he is going to use it when he picks up his next Mexiletine RX.  He is advised to call us back if there is anything we can do to help lower the cost of Mexiletine after calling his insurance company and using the prescription savings card.

## 2017-08-08 ENCOUNTER — Ambulatory Visit (INDEPENDENT_AMBULATORY_CARE_PROVIDER_SITE_OTHER): Payer: Managed Care, Other (non HMO) | Admitting: *Deleted

## 2017-08-08 DIAGNOSIS — I4891 Unspecified atrial fibrillation: Secondary | ICD-10-CM | POA: Diagnosis not present

## 2017-08-08 DIAGNOSIS — Z5181 Encounter for therapeutic drug level monitoring: Secondary | ICD-10-CM | POA: Diagnosis not present

## 2017-08-08 LAB — POCT INR: INR: 2.5 (ref 2.0–3.0)

## 2017-08-08 NOTE — Patient Instructions (Signed)
Description   Continue taking 1.5 tablets everyday except 1 tablet on Mondays, Wednesdays and Fridays.  Recheck INR in 5 weeks.  Coumadin clinic [phone]. Keep intake of greens consistent.

## 2017-08-10 ENCOUNTER — Ambulatory Visit (INDEPENDENT_AMBULATORY_CARE_PROVIDER_SITE_OTHER): Payer: Managed Care, Other (non HMO) | Admitting: *Deleted

## 2017-08-10 DIAGNOSIS — I4891 Unspecified atrial fibrillation: Secondary | ICD-10-CM | POA: Diagnosis not present

## 2017-08-10 DIAGNOSIS — I469 Cardiac arrest, cause unspecified: Secondary | ICD-10-CM

## 2017-08-10 DIAGNOSIS — I4901 Ventricular fibrillation: Secondary | ICD-10-CM

## 2017-08-10 NOTE — Progress Notes (Signed)
Remote ICD transmission.   

## 2017-09-06 LAB — CUP PACEART REMOTE DEVICE CHECK
Battery Voltage: 3.02 V
Brady Statistic RV Percent Paced: 0.01 %
Date Time Interrogation Session: 20190522052204
HIGH POWER IMPEDANCE MEASURED VALUE: 83 Ohm
Lead Channel Impedance Value: 456 Ohm
Lead Channel Sensing Intrinsic Amplitude: 12 mV
Lead Channel Setting Pacing Amplitude: 2.5 V
MDC IDC LEAD IMPLANT DT: 20170911
MDC IDC LEAD LOCATION: 753860
MDC IDC MSMT BATTERY REMAINING LONGEVITY: 128 mo
MDC IDC MSMT LEADCHNL RV IMPEDANCE VALUE: 380 Ohm
MDC IDC MSMT LEADCHNL RV PACING THRESHOLD AMPLITUDE: 0.75 V
MDC IDC MSMT LEADCHNL RV PACING THRESHOLD PULSEWIDTH: 0.4 ms
MDC IDC MSMT LEADCHNL RV SENSING INTR AMPL: 12 mV
MDC IDC PG IMPLANT DT: 20170911
MDC IDC SET LEADCHNL RV PACING PULSEWIDTH: 0.4 ms
MDC IDC SET LEADCHNL RV SENSING SENSITIVITY: 0.3 mV

## 2017-09-12 ENCOUNTER — Ambulatory Visit (INDEPENDENT_AMBULATORY_CARE_PROVIDER_SITE_OTHER): Payer: Managed Care, Other (non HMO) | Admitting: *Deleted

## 2017-09-12 DIAGNOSIS — Z5181 Encounter for therapeutic drug level monitoring: Secondary | ICD-10-CM

## 2017-09-12 DIAGNOSIS — I4891 Unspecified atrial fibrillation: Secondary | ICD-10-CM | POA: Diagnosis not present

## 2017-09-12 LAB — POCT INR: INR: 2.7 (ref 2.0–3.0)

## 2017-09-12 NOTE — Patient Instructions (Signed)
Description   Continue taking 1.5 tablets everyday except 1 tablet on Mondays, Wednesdays and Fridays.  Recheck INR in 6 weeks.  Coumadin clinic #[phone]. Keep intake of greens consistent.     

## 2017-09-16 ENCOUNTER — Other Ambulatory Visit: Payer: Self-pay | Admitting: Physician Assistant

## 2017-09-22 ENCOUNTER — Other Ambulatory Visit: Payer: Self-pay | Admitting: Cardiology

## 2017-10-20 ENCOUNTER — Encounter: Payer: Self-pay | Admitting: Cardiology

## 2017-10-20 ENCOUNTER — Encounter (INDEPENDENT_AMBULATORY_CARE_PROVIDER_SITE_OTHER): Payer: Self-pay

## 2017-10-20 ENCOUNTER — Ambulatory Visit (INDEPENDENT_AMBULATORY_CARE_PROVIDER_SITE_OTHER): Payer: Managed Care, Other (non HMO) | Admitting: Cardiology

## 2017-10-20 ENCOUNTER — Ambulatory Visit (INDEPENDENT_AMBULATORY_CARE_PROVIDER_SITE_OTHER): Payer: Managed Care, Other (non HMO) | Admitting: *Deleted

## 2017-10-20 VITALS — BP 104/70 | HR 58 | Ht 71.0 in | Wt 230.8 lb

## 2017-10-20 DIAGNOSIS — Z95810 Presence of automatic (implantable) cardiac defibrillator: Secondary | ICD-10-CM

## 2017-10-20 DIAGNOSIS — I472 Ventricular tachycardia, unspecified: Secondary | ICD-10-CM

## 2017-10-20 DIAGNOSIS — I469 Cardiac arrest, cause unspecified: Secondary | ICD-10-CM | POA: Diagnosis not present

## 2017-10-20 DIAGNOSIS — I428 Other cardiomyopathies: Secondary | ICD-10-CM | POA: Diagnosis not present

## 2017-10-20 DIAGNOSIS — Z79899 Other long term (current) drug therapy: Secondary | ICD-10-CM

## 2017-10-20 DIAGNOSIS — I5021 Acute systolic (congestive) heart failure: Secondary | ICD-10-CM | POA: Diagnosis not present

## 2017-10-20 DIAGNOSIS — I4901 Ventricular fibrillation: Secondary | ICD-10-CM

## 2017-10-20 DIAGNOSIS — Z5181 Encounter for therapeutic drug level monitoring: Secondary | ICD-10-CM

## 2017-10-20 DIAGNOSIS — I5022 Chronic systolic (congestive) heart failure: Secondary | ICD-10-CM

## 2017-10-20 DIAGNOSIS — I4891 Unspecified atrial fibrillation: Secondary | ICD-10-CM

## 2017-10-20 LAB — POCT INR: INR: 2.6 (ref 2.0–3.0)

## 2017-10-20 NOTE — Progress Notes (Signed)
Electrophysiology Office Note   Date:  10/20/2017   ID:  Arthur Chase, DOB October 28, 1955, MRN [id number]  PCP:  Shaune Pollack, MD  Cardiologist:  Excell Seltzer Primary Electrophysiologist:  Will [name], MD    No chief complaint on file.    History of Present Illness: Arthur Chase is a 62 y.o. male who presents today for electrophysiology evaluation.   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 He was admitted 11/16/15 with cardiac arrest and EMS strips demonstrating multiple episodes of VT/VF with a cycle length ~289msec. He was successfully rescuscitated - underwent a prolonged hospital admission s/p cooling. Underwent ICD implantation with a Medtronic ICD on 12/01/15. Was put on amiodarone. Had received ICD shocks in the past.  Mexiletine was started at the last visit.  Today, denies symptoms of palpitations, chest pain, shortness of breath, orthopnea, PND, lower extremity edema, claudication, dizziness, presyncope, syncope, bleeding, or neurologic sequela. The patient is tolerating medications without difficulties.  Overall he is feeling well.  He has had no further episodes of chest pain or shortness of breath.  No ventricular arrhythmias noted on his device.  Past Medical History:  Diagnosis Date  . Anoxic encephalopathy (HCC) 10/2015   a. 10/2015 in setting of cardiac arrest - recovered prior to DC.  . Cardiac arrest with ventricular fibrillation (HCC)    a. 11/2015: Unclear etiology. Nl cors on cath 8/27. S/P cooling. On amiodarone and s/p ICD for secondary prevention on 12/01/15. EF down to 25-30% but normalized to 68 by subsequent cardiac MR  . HIT (heparin-induced  thrombocytopenia) (HCC)    a. 11/2015 during admission for VF arrest. HIT panel positive. Placed on coumadin for 2 months  . HTN (hypertension)   . Hypokalemia    a. 11/2015: resolved on K supplementation and spironolactone  . ICD (implantable cardioverter-defibrillator) in place    a. Medtronic Visia AF MRI (serial  Number [id number]) ICD.  Marland Kitchen Myocardial stunning (HCC)    a. 11/2015: EF down to 20-25% after VF arrest but improved to ~68 by subsequent cardiac MR.  Marland Kitchen NICM (nonischemic cardiomyopathy) (HCC)   . Obesity   . PAF (paroxysmal atrial fibrillation) (HCC)    a. 11/2015 brief run of afib with RVR while intubated during a complicated admission for VF arrest and VDRF. No recurrence on amio. CHADSVASC 2 (CHF, HTN) and on coumadin for HIT with positive thrombotic markers. If no long term recurrence, he may be able to come off OAC  . Ventricular tachycardia (HCC) 10/2015   Past Surgical History:  Procedure Laterality Date  . CARDIAC CATHETERIZATION N/A 11/16/2015   Procedure: Left Heart Cath and Coronary Angiography;  Surgeon: Peter M Swaziland, MD;  Location: [facility] INVASIVE CV LAB;  Service: Cardiovascular;  Laterality: N/A;  . EP IMPLANTABLE DEVICE N/A 12/01/2015   Procedure: ICD Implant;  Surgeon: Will [name], MD;  Location: [facility] INVASIVE CV LAB;  Service: Cardiovascular;  Laterality: N/A;     Current Outpatient Medications  Medication Sig Dispense Refill  . amiodarone (PACERONE) 200 MG tablet Take 1 tablet (200 mg total) by mouth daily. Please make yearly appt with Dr. Elberta Fortis for July before anymore refills. 1st attempt 90 tablet 1  . KLOR-CON M20 20 MEQ tablet TAKE  1 TABLET BY MOUTH TWICE A DAY 180 tablet 1  . levothyroxine (SYNTHROID, LEVOTHROID) 112 MCG tablet Take 112 mcg by mouth daily before breakfast.    . losartan (COZAAR) 25 MG tablet TAKE 1 TABLET BY MOUTH EVERY DAY 90 tablet 3  . metoprolol succinate (TOPROL-XL) 25 MG 24 hr tablet Take 1 tablet (25 mg total) by mouth daily.  90 tablet 2  . mexiletine (MEXITIL) 200 MG capsule Take 1 capsule (200 mg total) by mouth 2 (two) times daily. 180 capsule 3  . Multiple Vitamin (MULTIVITAMIN WITH MINERALS) TABS tablet Take 1 tablet by mouth daily. Centrum Silver    . pantoprazole (PROTONIX) 40 MG tablet TAKE 1 TABLET BY MOUTH EVERY DAY 30 tablet 8  . spironolactone (ALDACTONE) 25 MG tablet TAKE 1 TABLET BY MOUTH EVERY DAY 90 tablet 1  . warfarin (COUMADIN) 5 MG tablet TAKE AS DIRECTED BY COUMADIN CLINIC 120 tablet 0   No current facility-administered medications for this visit.     Allergies:   Heparin   Social History:  The patient  reports that he has quit smoking. His smoking use included cigarettes. He has never used smokeless tobacco. He reports that he does not drink alcohol or use drugs.   Family History:  The patient's family history includes Alzheimer's disease in his father; Heart attack in his mother.   ROS:  Please see the history of present illness.   Otherwise, review of systems is positive for none.   All other systems are reviewed and negative.   PHYSICAL EXAM: VS:  BP 104/70   Pulse (!) 58   Ht 5\' 11"  (1.803 m)   Wt 230 lb 12.8 oz (104.7 kg)   SpO2 94%   BMI 32.19 kg/m  , BMI Body mass index is 32.19 kg/m. GEN: Well nourished, well developed, in no acute distress  HEENT: normal  Neck: no JVD, carotid bruits, or masses Cardiac: RRR; no murmurs, rubs, or gallops,no edema  Respiratory:  clear to auscultation bilaterally, normal work of breathing GI: soft, nontender, nondistended, + BS MS: no deformity or atrophy  Skin: warm and dry, device site well healed Neuro:  Strength and sensation are intact Psych: euthymic mood, full affect  EKG:  EKG is ordered today. Personal review of the ekg ordered shows sinus rhythm, left anterior fascicular block, rate 58  Personal review of the device interrogation today. Results in [facility]   Recent Labs: No results found for requested labs within last 8760  hours.    Lipid Panel  No results found for: CHOL, TRIG, HDL, CHOLHDL, VLDL, LDLCALC, LDLDIRECT   Wt Readings from Last 3 Encounters:  10/20/17 230 lb 12.8 oz (104.7 kg)  06/13/17 228 lb (103.4 kg)  04/19/17 230 lb (104.3 kg)      Other studies Reviewed: Additional studies/ records that were reviewed today include: TTE 11/20/15, Cath 11/16/15  Review of the above records today demonstrates:  - Left ventricle: The cavity size was moderately dilated. There was   mild concentric hypertrophy. Systolic function was severely   reduced. The estimated ejection fraction was in the range of 25%   to 30%. Diffuse hypokinesis. The study is not technically   sufficient to allow evaluation of LV diastolic function. - Aortic valve: Trileaflet; normal thickness leaflets. There was no   regurgitation. - Aortic root: The aortic root was normal in size. - Mitral valve: Structurally normal valve. There was no   regurgitation. - Left atrium: The atrium was moderately dilated. -  Right ventricle: The cavity size was mildly dilated. Wall   thickness was normal. Systolic function was moderately reduced. - Right atrium: Pacer wire or catheter noted in right atrium. - Tricuspid valve: There was mild regurgitation. - Pulmonary arteries: Systolic pressure was within the normal   range. PA peak pressure: 33 mm Hg (S). - Inferior vena cava: The vessel was normal in size. - Pericardium, extracardiac: There was no pericardial effusion.   The left ventricular systolic function is normal.  LV end diastolic pressure is normal.  The left ventricular ejection fraction is 55-65% by visual estimate.   1. Normal coronary anatomy 2. Normal LV function  TTE 03/23/16 - Left ventricle: The cavity size was normal. There was mild   concentric hypertrophy. Systolic function was normal. The   estimated ejection fraction was in the range of 55% to 60%. Wall   motion was normal; there were no regional wall motion    abnormalities. Doppler parameters are consistent with abnormal   left ventricular relaxation (grade 1 diastolic dysfunction). - Mitral valve: Mild, late systolicprolapse, involving the anterior   leaflet. - Left atrium: The atrium was mildly dilated.  ASSESSMENT AND PLAN:  1. Cardiac arrest with VF -status post Medtronic ICD.  Currently on amiodarone and mexiletine.  No further episodes of ventricular arrhythmia since starting mexiletine.  No changes.  2. NICM -ejection fraction has since improved.  Continue with current management.  3. Paroxysmal atrial fib -noted on device interrogation.  Continue Coumadin.  None noted on recent check.  Stop Coumadin today as he has had no further atrial fibrillation.  4. Hypertension: Currently well controlled.  No changes.  Current medicines are reviewed at length with the patient today.   The patient does not have concerns regarding his medicines.  The following changes were made today: None  Labs/ tests ordered today include:  Orders Placed This Encounter  Procedures  . Hepatic function panel  . EKG 12-Lead     Disposition:   FU with Will Camnitz 6 months  Signed, Will [name], MD  10/20/2017 3:52 PM     [facility] [address] [place] [address] [phone] (office) [phone] (fax)

## 2017-10-20 NOTE — Patient Instructions (Addendum)
Medication Instructions:  Your physician has recommended you make the following change in your medication:  1. STOP Coumadin  *If you need a refill on your cardiac medications before your next appointment, please call your pharmacy*  Labwork: LFTs today  Testing/Procedures: None ordered  Follow-Up: Remote monitoring is used to monitor your Pacemaker or ICD from home. This monitoring reduces the number of office visits required to check your device to one time per year. It allows [place] to keep an eye on the functioning of your device to ensure it is working properly. You are scheduled for a device check from home on 11/09/2017. You may send your transmission at any time that day. If you have a wireless device, the transmission will be sent automatically. After your physician reviews your transmission, you will receive a postcard with your next transmission date.  Your physician wants you to follow-up in: 6 months with Dr. Elberta Fortis.  You will receive a reminder letter in the mail two months in advance. If you don't receive a letter, please call our office to schedule the follow-up appointment.  Thank you for choosing [facility]!!   Dory Horn, RN [phone]  Any Other Special Instructions Will Be Listed Below (If Applicable).

## 2017-10-20 NOTE — Patient Instructions (Signed)
Description   Continue taking 1.5 tablets everyday except 1 tablet on Mondays, Wednesdays and Fridays.  Recheck INR in 6 weeks.  Coumadin clinic #[phone]. Keep intake of greens consistent.     

## 2017-10-21 LAB — HEPATIC FUNCTION PANEL
ALT: 19 IU/L (ref 0–44)
AST: 19 IU/L (ref 0–40)
Albumin: 4.7 g/dL (ref 3.6–4.8)
Alkaline Phosphatase: 66 IU/L (ref 39–117)
Bilirubin Total: 0.3 mg/dL (ref 0.0–1.2)
Bilirubin, Direct: 0.09 mg/dL (ref 0.00–0.40)
Total Protein: 7.1 g/dL (ref 6.0–8.5)

## 2017-11-07 LAB — CUP PACEART INCLINIC DEVICE CHECK
Battery Voltage: 3.01 V
Brady Statistic RV Percent Paced: 0.01 %
HIGH POWER IMPEDANCE MEASURED VALUE: 84 Ohm
Implantable Lead Location: 753860
Lead Channel Impedance Value: 342 Ohm
Lead Channel Impedance Value: 456 Ohm
Lead Channel Pacing Threshold Amplitude: 1 V
Lead Channel Sensing Intrinsic Amplitude: 13.875 mV
Lead Channel Setting Pacing Amplitude: 2.5 V
Lead Channel Setting Pacing Pulse Width: 0.4 ms
MDC IDC LEAD IMPLANT DT: 20170911
MDC IDC MSMT BATTERY REMAINING LONGEVITY: 126 mo
MDC IDC MSMT LEADCHNL RV PACING THRESHOLD PULSEWIDTH: 0.4 ms
MDC IDC PG IMPLANT DT: 20170911
MDC IDC SESS DTM: 20190801191802
MDC IDC SET LEADCHNL RV SENSING SENSITIVITY: 0.3 mV

## 2017-11-09 ENCOUNTER — Ambulatory Visit (INDEPENDENT_AMBULATORY_CARE_PROVIDER_SITE_OTHER): Payer: Managed Care, Other (non HMO) | Admitting: *Deleted

## 2017-11-09 DIAGNOSIS — I472 Ventricular tachycardia, unspecified: Secondary | ICD-10-CM

## 2017-11-09 NOTE — Progress Notes (Signed)
Remote ICD transmission.   

## 2017-11-11 ENCOUNTER — Encounter: Payer: Self-pay | Admitting: Cardiology

## 2017-11-13 ENCOUNTER — Other Ambulatory Visit: Payer: Self-pay | Admitting: Cardiology

## 2017-12-01 NOTE — Addendum Note (Signed)
Addended by: Baird Lyons on: 12/01/2017 07:06 PM   Modules accepted: Orders

## 2017-12-12 LAB — CUP PACEART REMOTE DEVICE CHECK
Battery Voltage: 3.02 V
Brady Statistic RV Percent Paced: 0.01 %
Date Time Interrogation Session: 20190821083823
HIGH POWER IMPEDANCE MEASURED VALUE: 74 Ohm
Implantable Lead Implant Date: 20170911
Implantable Pulse Generator Implant Date: 20170911
Lead Channel Impedance Value: 342 Ohm
Lead Channel Pacing Threshold Pulse Width: 0.4 ms
Lead Channel Sensing Intrinsic Amplitude: 12.125 mV
Lead Channel Sensing Intrinsic Amplitude: 12.125 mV
Lead Channel Setting Pacing Amplitude: 2.5 V
MDC IDC LEAD LOCATION: 753860
MDC IDC MSMT BATTERY REMAINING LONGEVITY: 126 mo
MDC IDC MSMT LEADCHNL RV IMPEDANCE VALUE: 437 Ohm
MDC IDC MSMT LEADCHNL RV PACING THRESHOLD AMPLITUDE: 0.875 V
MDC IDC SET LEADCHNL RV PACING PULSEWIDTH: 0.4 ms
MDC IDC SET LEADCHNL RV SENSING SENSITIVITY: 0.3 mV

## 2017-12-22 ENCOUNTER — Telehealth: Payer: Self-pay

## 2017-12-22 NOTE — Telephone Encounter (Signed)
Scheduled patient for one year visit with Dr. Excell Seltzer 06/15/2018. He will have his echo prior to appointment 06/12/2018. He was grateful for call and agrees with treatment plan.

## 2018-02-08 ENCOUNTER — Ambulatory Visit (INDEPENDENT_AMBULATORY_CARE_PROVIDER_SITE_OTHER): Payer: Managed Care, Other (non HMO)

## 2018-02-08 DIAGNOSIS — I472 Ventricular tachycardia, unspecified: Secondary | ICD-10-CM

## 2018-02-08 DIAGNOSIS — I469 Cardiac arrest, cause unspecified: Secondary | ICD-10-CM

## 2018-02-08 DIAGNOSIS — I4901 Ventricular fibrillation: Secondary | ICD-10-CM

## 2018-02-09 NOTE — Progress Notes (Signed)
Remote ICD transmission.   

## 2018-02-17 ENCOUNTER — Other Ambulatory Visit: Payer: Self-pay | Admitting: Physician Assistant

## 2018-03-07 ENCOUNTER — Other Ambulatory Visit: Payer: Self-pay | Admitting: Physician Assistant

## 2018-04-05 LAB — CUP PACEART REMOTE DEVICE CHECK
Battery Remaining Longevity: 124 mo
HighPow Impedance: 80 Ohm
Implantable Lead Implant Date: 20170911
Implantable Lead Location: 753860
Implantable Pulse Generator Implant Date: 20170911
Lead Channel Impedance Value: 342 Ohm
Lead Channel Pacing Threshold Amplitude: 0.75 V
Lead Channel Sensing Intrinsic Amplitude: 11.75 mV
Lead Channel Setting Pacing Pulse Width: 0.4 ms
MDC IDC MSMT BATTERY VOLTAGE: 3.02 V
MDC IDC MSMT LEADCHNL RV IMPEDANCE VALUE: 456 Ohm
MDC IDC MSMT LEADCHNL RV PACING THRESHOLD PULSEWIDTH: 0.4 ms
MDC IDC MSMT LEADCHNL RV SENSING INTR AMPL: 11.75 mV
MDC IDC SESS DTM: 20191120062304
MDC IDC SET LEADCHNL RV PACING AMPLITUDE: 2.5 V
MDC IDC SET LEADCHNL RV SENSING SENSITIVITY: 0.3 mV
MDC IDC STAT BRADY RV PERCENT PACED: 0.01 %

## 2018-04-14 ENCOUNTER — Encounter: Payer: Self-pay | Admitting: Cardiology

## 2018-05-05 ENCOUNTER — Encounter (INDEPENDENT_AMBULATORY_CARE_PROVIDER_SITE_OTHER): Payer: Self-pay

## 2018-05-05 ENCOUNTER — Ambulatory Visit (INDEPENDENT_AMBULATORY_CARE_PROVIDER_SITE_OTHER): Payer: Managed Care, Other (non HMO) | Admitting: Cardiology

## 2018-05-05 ENCOUNTER — Encounter: Payer: Self-pay | Admitting: Cardiology

## 2018-05-05 VITALS — BP 118/60 | HR 57 | Ht 71.0 in | Wt 235.6 lb

## 2018-05-05 DIAGNOSIS — I480 Paroxysmal atrial fibrillation: Secondary | ICD-10-CM | POA: Diagnosis not present

## 2018-05-05 NOTE — Progress Notes (Signed)
Electrophysiology Office Note   Date:  05/05/2018   ID:  JOVI ASIS, DOB 19-Dec-1955, MRN [id number]  PCP:  Shaune Pollack, MD (Inactive)  Cardiologist:  Excell Seltzer Primary Electrophysiologist:  Orange Hilligoss [name], MD    No chief complaint on file.    History of Present Illness: Arthur Chase is a 63 y.o. male who presents today for electrophysiology evaluation.   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 He was admitted 11/16/15 with cardiac arrest and EMS strips demonstrating multiple episodes of VT/VF with a cycle length ~259msec. He was successfully rescuscitated - underwent a prolonged hospital admission s/p cooling. Underwent ICD implantation with a Medtronic ICD on 12/01/15. Was put on amiodarone. Had received ICD shocks in the past.  He was thus started on mexiletine.  Today, denies symptoms of palpitations, chest pain, shortness of breath, orthopnea, PND, lower extremity edema, claudication, dizziness, presyncope, syncope, bleeding, or neurologic sequela. The patient is tolerating medications without difficulties.  Feeling well.  He has no complaints.  He did see his eye doctor who said that she saw evidence of amiodarone in his eyes.  Past Medical History:  Diagnosis Date  . Anoxic encephalopathy (HCC) 10/2015   a. 10/2015 in setting of cardiac arrest - recovered prior to DC.  . Cardiac arrest with ventricular fibrillation (HCC)    a. 11/2015: Unclear etiology. Nl cors on cath 8/27. S/P cooling. On amiodarone and s/p ICD for secondary prevention on 12/01/15. EF down to 25-30% but normalized to 68 by subsequent cardiac MR  . HIT (heparin-induced thrombocytopenia) (HCC)    a.  11/2015 during admission for VF arrest. HIT panel positive. Placed on coumadin for 2 months  . HTN (hypertension)   . Hypokalemia    a. 11/2015: resolved on K supplementation and spironolactone  . ICD (implantable cardioverter-defibrillator) in place    a. Medtronic Visia AF MRI (serial  Number [id number]) ICD.  Marland Kitchen Myocardial stunning (HCC)    a. 11/2015: EF down to 20-25% after VF arrest but improved to ~68 by subsequent cardiac MR.  Marland Kitchen NICM (nonischemic cardiomyopathy) (HCC)   . Obesity   . PAF (paroxysmal atrial fibrillation) (HCC)    a. 11/2015 brief run of afib with RVR while intubated during a complicated admission for VF arrest and VDRF. No recurrence on amio. CHADSVASC 2 (CHF, HTN) and on coumadin for HIT with positive thrombotic markers. If no long term recurrence, he may be able to come off OAC  . Ventricular tachycardia (HCC) 10/2015   Past Surgical History:  Procedure Laterality Date  . CARDIAC CATHETERIZATION N/A 11/16/2015   Procedure: Left Heart Cath and Coronary Angiography;  Surgeon: Peter M Swaziland, MD;  Location: [facility] INVASIVE CV LAB;  Service: Cardiovascular;  Laterality: N/A;  . EP IMPLANTABLE DEVICE N/A 12/01/2015   Procedure: ICD Implant;  Surgeon: Shamere Campas [name], MD;  Location: [facility] INVASIVE CV LAB;  Service: Cardiovascular;  Laterality: N/A;     Current Outpatient Medications  Medication Sig Dispense Refill  . amiodarone (PACERONE) 200 MG tablet Take 1 tablet (200 mg total) by mouth daily. Please make yearly appt with Dr. Elberta Fortis for July before anymore refills. 1st attempt 90 tablet 1  . levothyroxine (SYNTHROID, LEVOTHROID) 112 MCG tablet Take 112  mcg by mouth daily before breakfast.    . losartan (COZAAR) 25 MG tablet TAKE 1 TABLET BY MOUTH EVERY DAY 90 tablet 3  . metoprolol succinate (TOPROL-XL) 25 MG 24 hr tablet TAKE 1 TABLET BY MOUTH EVERY DAY 90 tablet 3  . mexiletine (MEXITIL) 200 MG capsule Take 1 capsule (200 mg total) by mouth 2 (two) times daily. 180  capsule 3  . Multiple Vitamin (MULTIVITAMIN WITH MINERALS) TABS tablet Take 1 tablet by mouth daily. Centrum Silver    . pantoprazole (PROTONIX) 40 MG tablet TAKE 1 TABLET BY MOUTH EVERY DAY 90 tablet 2  . potassium chloride SA (KLOR-CON M20) 20 MEQ tablet Take 1 tablet (20 mEq total) by mouth 2 (two) times daily. Please keep upcoming appt for future refills. Thank you 180 tablet 0  . spironolactone (ALDACTONE) 25 MG tablet TAKE 1 TABLET BY MOUTH EVERY DAY 90 tablet 3   No current facility-administered medications for this visit.     Allergies:   Heparin   Social History:  The patient  reports that he has quit smoking. His smoking use included cigarettes. He has never used smokeless tobacco. He reports that he does not drink alcohol or use drugs.   Family History:  The patient's family history includes Alzheimer's disease in his father; Heart attack in his mother.   ROS:  Please see the history of present illness.   Otherwise, review of systems is positive for none.   All other systems are reviewed and negative.   PHYSICAL EXAM: VS:  There were no vitals taken for this visit. , BMI There is no height or weight on file to calculate BMI. GEN: Well nourished, well developed, in no acute distress  HEENT: normal  Neck: no JVD, carotid bruits, or masses Cardiac: RRR; no murmurs, rubs, or gallops,no edema  Respiratory:  clear to auscultation bilaterally, normal work of breathing GI: soft, nontender, nondistended, + BS MS: no deformity or atrophy  Skin: warm and dry, device site well healed Neuro:  Strength and sensation are intact Psych: euthymic mood, full affect  EKG:  EKG is ordered today. Personal review of the ekg ordered shows SR, rate 57, inferior infarct  Personal review of the device interrogation today. Results in [facility]   Recent Labs: 10/20/2017: ALT 19    Lipid Panel  No results found for: CHOL, TRIG, HDL, CHOLHDL, VLDL, LDLCALC, LDLDIRECT   Wt Readings from Last 3  Encounters:  10/20/17 230 lb 12.8 oz (104.7 kg)  06/13/17 228 lb (103.4 kg)  04/19/17 230 lb (104.3 kg)      Other studies Reviewed: Additional studies/ records that were reviewed today include: TTE 11/20/15, Cath 11/16/15  Review of the above records today demonstrates:  - Left ventricle: The cavity size was moderately dilated. There was   mild concentric hypertrophy. Systolic function was severely   reduced. The estimated ejection fraction was in the range of 25%   to 30%. Diffuse hypokinesis. The study is not technically   sufficient to allow evaluation of LV diastolic function. - Aortic valve: Trileaflet; normal thickness leaflets. There was no   regurgitation. - Aortic root: The aortic root was normal in size. - Mitral valve: Structurally normal valve. There was no   regurgitation. - Left atrium: The atrium was moderately dilated. - Right ventricle: The cavity size was mildly dilated. Wall   thickness was normal. Systolic function was moderately reduced. - Right atrium: Pacer wire or catheter noted in right atrium. - Tricuspid valve: There  was mild regurgitation. - Pulmonary arteries: Systolic pressure was within the normal   range. PA peak pressure: 33 mm Hg (S). - Inferior vena cava: The vessel was normal in size. - Pericardium, extracardiac: There was no pericardial effusion.   The left ventricular systolic function is normal.  LV end diastolic pressure is normal.  The left ventricular ejection fraction is 55-65% by visual estimate.   1. Normal coronary anatomy 2. Normal LV function  TTE 03/23/16 - Left ventricle: The cavity size was normal. There was mild   concentric hypertrophy. Systolic function was normal. The   estimated ejection fraction was in the range of 55% to 60%. Wall   motion was normal; there were no regional wall motion   abnormalities. Doppler parameters are consistent with abnormal   left ventricular relaxation (grade 1 diastolic dysfunction). -  Mitral valve: Mild, late systolicprolapse, involving the anterior   leaflet. - Left atrium: The atrium was mildly dilated.  ASSESSMENT AND PLAN:  1. Cardiac arrest with VF: Status post Medtronic ICD.  Currently on amiodarone and mexiletine.  Has had no further episodes of ventricular arrhythmia since starting mexiletine.  Has had no further arrhythmias noted.  Due to his issues with his eyes and amiodarone, Wilma Wuthrich plan to stop this medication today.   2. NICM -ejection fraction since improved  3. Paroxysmal atrial fib: Noted on device interrogation.  Currently on Coumadin.  No further episodes noted  This patients CHA2DS2-VASc Score and unadjusted Ischemic Stroke Rate (% per year) is equal to 2.2 % stroke rate/year from a score of 2  Above score calculated as 1 point each if present [CHF, HTN, DM, Vascular=MI/PAD/Aortic Plaque, Age if 65-74, or Male] Above score calculated as 2 points each if present [Age > 75, or Stroke/TIA/TE]   4. Hypertension: Currently well controlled  Current medicines are reviewed at length with the patient today.   The patient does not have concerns regarding his medicines.  The following changes were made today: Stop amiodarone  Labs/ tests ordered today include:  No orders of the defined types were placed in this encounter.    Disposition:   FU with Azariel Banik 6 months  Signed, Shan Valdes [name], MD  05/05/2018 2:03 PM     [facility] [address] [place] [address] [phone] (office) [phone] (fax)

## 2018-05-05 NOTE — Patient Instructions (Addendum)
Medication Instructions:  Your physician has recommended you make the following change in your medication:  1. STOP Amiodarone  *If you need a refill on your cardiac medications before your next appointment, please call your pharmacy*  Labwork: None ordered  Testing/Procedures: None ordered  Follow-Up: Remote monitoring is used to monitor your Pacemaker or ICD from home. This monitoring reduces the number of office visits required to check your device to one time per year. It allows [place] to keep an eye on the functioning of your device to ensure it is working properly. You are scheduled for a device check from home on 05/10/2018. You may send your transmission at any time that day. If you have a wireless device, the transmission will be sent automatically. After your physician reviews your transmission, you will receive a postcard with your next transmission date.  Your physician wants you to follow-up in: 6 months with Dr. Elberta Fortis.  You will receive a reminder letter in the mail two months in advance. If you don't receive a letter, please call our office to schedule the follow-up appointment.  Thank you for choosing [facility]!!   Dory Horn, RN [phone]

## 2018-05-09 LAB — CUP PACEART INCLINIC DEVICE CHECK
Implantable Lead Implant Date: 20170911
Implantable Pulse Generator Implant Date: 20170911
MDC IDC LEAD LOCATION: 753860
MDC IDC SESS DTM: 20200218120003

## 2018-05-10 ENCOUNTER — Ambulatory Visit (INDEPENDENT_AMBULATORY_CARE_PROVIDER_SITE_OTHER): Payer: Managed Care, Other (non HMO)

## 2018-05-10 DIAGNOSIS — I472 Ventricular tachycardia, unspecified: Secondary | ICD-10-CM

## 2018-05-10 DIAGNOSIS — I480 Paroxysmal atrial fibrillation: Secondary | ICD-10-CM | POA: Diagnosis not present

## 2018-05-11 LAB — CUP PACEART REMOTE DEVICE CHECK
Battery Remaining Longevity: 122 mo
HighPow Impedance: 81 Ohm
Implantable Lead Implant Date: 20170911
Implantable Lead Location: 753860
Implantable Pulse Generator Implant Date: 20170911
Lead Channel Impedance Value: 380 Ohm
Lead Channel Impedance Value: 456 Ohm
Lead Channel Pacing Threshold Amplitude: 0.625 V
Lead Channel Pacing Threshold Pulse Width: 0.4 ms
Lead Channel Sensing Intrinsic Amplitude: 11.125 mV
Lead Channel Setting Pacing Pulse Width: 0.4 ms
Lead Channel Setting Sensing Sensitivity: 0.3 mV
MDC IDC MSMT BATTERY VOLTAGE: 3 V
MDC IDC MSMT LEADCHNL RV SENSING INTR AMPL: 11.125 mV
MDC IDC SESS DTM: 20200219072821
MDC IDC SET LEADCHNL RV PACING AMPLITUDE: 2.5 V
MDC IDC STAT BRADY RV PERCENT PACED: 0.01 %

## 2018-05-18 NOTE — Progress Notes (Signed)
Remote ICD transmission.   

## 2018-05-19 ENCOUNTER — Encounter: Payer: Self-pay | Admitting: Cardiology

## 2018-06-01 ENCOUNTER — Other Ambulatory Visit: Payer: Self-pay | Admitting: *Deleted

## 2018-06-01 MED ORDER — POTASSIUM CHLORIDE CRYS ER 20 MEQ PO TBCR: 20.0000 meq | EXTENDED_RELEASE_TABLET | Freq: Two times a day (BID) | ORAL | 0 refills | Status: DC

## 2018-06-12 ENCOUNTER — Other Ambulatory Visit (HOSPITAL_COMMUNITY): Payer: Managed Care, Other (non HMO)

## 2018-06-12 ENCOUNTER — Telehealth: Payer: Self-pay | Admitting: Cardiology

## 2018-06-12 DIAGNOSIS — I428 Other cardiomyopathies: Secondary | ICD-10-CM

## 2018-06-12 NOTE — Telephone Encounter (Signed)
The patient states he is comfortable delaying both echo and office visit.  He understands he will be called to arrange both visits once COVID-19 precautions are lifted. He was grateful for assistance.

## 2018-06-12 NOTE — Telephone Encounter (Signed)
Agreed thanks!

## 2018-06-12 NOTE — Telephone Encounter (Signed)
   Reason for call: Postpone/reschedule echocardiogram-prior nonischemic cardiomyopathy EF has returned to normal.  History of VT.  On mexiletine and metoprolol.  Ordering provider: Dr. Tonny Bollman  Date of service 06/12/2018  Spoke to patient.  He is doing well, no increasing shortness of breath chest pain syncope or adverse side effects.  He feels stable.  In light of the coronavirus situation, we have elected to postpone his echocardiogram to a later date.  I think would be reasonable for him given his stability to postpone for greater than 12 weeks.  I also spoke to him about his upcoming appointment with Dr. Excell Seltzer for Thursday 3/26.  I told him that there is a possibility that this will be postponed as well.  I will forward this message over to Edythe Lynn and Dr. Excell Seltzer.  He was appreciative of the call.  He knows to contact us if any worrisome symptoms develop.  Donato Schultz, MD

## 2018-06-15 ENCOUNTER — Ambulatory Visit: Payer: Managed Care, Other (non HMO) | Admitting: Cardiovascular Disease

## 2018-06-17 ENCOUNTER — Other Ambulatory Visit: Payer: Self-pay | Admitting: Physician Assistant

## 2018-06-21 ENCOUNTER — Other Ambulatory Visit: Payer: Self-pay | Admitting: Cardiology

## 2018-06-21 MED ORDER — MEXILETINE HCL 200 MG PO CAPS: 200.0000 mg | ORAL_CAPSULE | Freq: Two times a day (BID) | ORAL | 3 refills | Status: DC

## 2018-06-21 MED ORDER — POTASSIUM CHLORIDE CRYS ER 20 MEQ PO TBCR: 20.0000 meq | EXTENDED_RELEASE_TABLET | Freq: Two times a day (BID) | ORAL | 1 refills | Status: DC

## 2018-06-21 NOTE — Telephone Encounter (Signed)
Pt's medications were sent to pt's pharmacy as requested. Confirmation received.  

## 2018-07-04 NOTE — Addendum Note (Signed)
Addended by: Gunnar Fusi A on: 07/04/2018 01:39 PM   Modules accepted: Orders

## 2018-07-04 NOTE — Telephone Encounter (Signed)
Scheduled OV and echo with Dr. Excell Seltzer 8/26 (as of now, office COVID-19  restrictions are lifted 8/1). The patient was grateful for call and agrees with treatment plan.

## 2018-07-21 ENCOUNTER — Other Ambulatory Visit (HOSPITAL_COMMUNITY): Payer: Managed Care, Other (non HMO)

## 2018-08-09 ENCOUNTER — Ambulatory Visit (INDEPENDENT_AMBULATORY_CARE_PROVIDER_SITE_OTHER): Payer: Managed Care, Other (non HMO) | Admitting: *Deleted

## 2018-08-09 DIAGNOSIS — I428 Other cardiomyopathies: Secondary | ICD-10-CM | POA: Diagnosis not present

## 2018-08-09 LAB — CUP PACEART REMOTE DEVICE CHECK
Battery Remaining Longevity: 119 mo
Battery Voltage: 3.01 V
Brady Statistic RV Percent Paced: 0.01 %
Date Time Interrogation Session: 20200520083824
HighPow Impedance: 77 Ohm
Implantable Lead Implant Date: 20170911
Implantable Lead Location: 753860
Implantable Pulse Generator Implant Date: 20170911
Lead Channel Impedance Value: 342 Ohm
Lead Channel Impedance Value: 437 Ohm
Lead Channel Pacing Threshold Amplitude: 0.625 V
Lead Channel Pacing Threshold Pulse Width: 0.4 ms
Lead Channel Sensing Intrinsic Amplitude: 10.125 mV
Lead Channel Sensing Intrinsic Amplitude: 10.125 mV
Lead Channel Setting Pacing Amplitude: 2.5 V
Lead Channel Setting Pacing Pulse Width: 0.4 ms
Lead Channel Setting Sensing Sensitivity: 0.3 mV

## 2018-08-10 ENCOUNTER — Other Ambulatory Visit: Payer: Self-pay

## 2018-08-18 ENCOUNTER — Encounter: Payer: Self-pay | Admitting: Cardiology

## 2018-08-18 NOTE — Progress Notes (Signed)
Remote ICD transmission.   

## 2018-10-16 ENCOUNTER — Other Ambulatory Visit: Payer: Self-pay | Admitting: Cardiovascular Disease

## 2018-10-30 ENCOUNTER — Other Ambulatory Visit: Payer: Self-pay

## 2018-10-30 MED ORDER — METOPROLOL SUCCINATE ER 25 MG PO TB24: 25.0000 mg | ORAL_TABLET | Freq: Every day | ORAL | 0 refills | Status: DC

## 2018-11-01 ENCOUNTER — Other Ambulatory Visit: Payer: Self-pay | Admitting: Cardiology

## 2018-11-01 MED ORDER — SPIRONOLACTONE 25 MG PO TABS: 25.0000 mg | ORAL_TABLET | Freq: Every day | ORAL | 1 refills | Status: DC

## 2018-11-05 ENCOUNTER — Other Ambulatory Visit: Payer: Self-pay | Admitting: Physician Assistant

## 2018-11-08 ENCOUNTER — Ambulatory Visit (INDEPENDENT_AMBULATORY_CARE_PROVIDER_SITE_OTHER): Payer: Managed Care, Other (non HMO) | Admitting: *Deleted

## 2018-11-08 DIAGNOSIS — I428 Other cardiomyopathies: Secondary | ICD-10-CM

## 2018-11-08 LAB — CUP PACEART REMOTE DEVICE CHECK
Battery Remaining Longevity: 117 mo
Battery Voltage: 3.01 V
Brady Statistic RV Percent Paced: 0.02 %
Date Time Interrogation Session: 20200819083824
HighPow Impedance: 74 Ohm
Implantable Lead Implant Date: 20170911
Implantable Lead Location: 753860
Implantable Pulse Generator Implant Date: 20170911
Lead Channel Impedance Value: 342 Ohm
Lead Channel Impedance Value: 437 Ohm
Lead Channel Pacing Threshold Amplitude: 0.75 V
Lead Channel Pacing Threshold Pulse Width: 0.4 ms
Lead Channel Sensing Intrinsic Amplitude: 8.5 mV
Lead Channel Sensing Intrinsic Amplitude: 8.5 mV
Lead Channel Setting Pacing Amplitude: 2.5 V
Lead Channel Setting Pacing Pulse Width: 0.4 ms
Lead Channel Setting Sensing Sensitivity: 0.3 mV

## 2018-11-15 ENCOUNTER — Other Ambulatory Visit: Payer: Self-pay

## 2018-11-15 ENCOUNTER — Ambulatory Visit (HOSPITAL_COMMUNITY): Payer: Managed Care, Other (non HMO) | Attending: Cardiovascular Disease

## 2018-11-15 ENCOUNTER — Ambulatory Visit (INDEPENDENT_AMBULATORY_CARE_PROVIDER_SITE_OTHER): Payer: Managed Care, Other (non HMO) | Admitting: Cardiovascular Disease

## 2018-11-15 ENCOUNTER — Encounter: Payer: Self-pay | Admitting: Cardiovascular Disease

## 2018-11-15 VITALS — BP 98/70 | HR 54 | Ht 71.0 in | Wt 214.6 lb

## 2018-11-15 DIAGNOSIS — I428 Other cardiomyopathies: Secondary | ICD-10-CM | POA: Insufficient documentation

## 2018-11-15 NOTE — Progress Notes (Signed)
Cardiology Office Note:    Date:  11/20/2018   ID:  Arthur Chase, DOB 10-25-1955, MRN [id number]  PCP:  Shaune Pollack, MD (Inactive)  Cardiologist:  No primary care provider on file.  Electrophysiologist:  Will [name], MD   Referring MD: Shaune Pollack, MD   Chief Complaint  Patient presents with  . Cardiomyopathy   History of Present Illness:    Arthur Chase is a 63 y.o. male with a hx of cardiac arrest in 2018 with ventricular fibrillation in the setting of nonischemic cardiomyopathy.  He underwent ICD implantation and is followed by Dr. Elberta Fortis.  He has had no significant symptoms of congestive heart failure.  The patient is here alone today.  He continues to work full-time and has no cardiopulmonary limitation.  He specifically denies chest pain, chest pressure, or shortness of breath.  He is compliant with his medications.  Past Medical History:  Diagnosis Date  . Anoxic encephalopathy (HCC) 10/2015   a. 10/2015 in setting of cardiac arrest - recovered prior to DC.  . Cardiac arrest with ventricular fibrillation (HCC)    a. 11/2015: Unclear etiology. Nl cors on cath 8/27. S/P cooling. On amiodarone and s/p ICD for secondary prevention on 12/01/15. EF down to 25-30% but normalized to 68 by subsequent cardiac MR  . HIT (heparin-induced thrombocytopenia) (HCC)    a. 11/2015 during admission for VF arrest. HIT panel positive. Placed on coumadin for 2 months  . HTN (hypertension)   . Hypokalemia    a. 11/2015: resolved on K supplementation and spironolactone  . ICD (implantable cardioverter-defibrillator) in place    a. Medtronic Visia AF MRI (serial  Number [id number]) ICD.  Marland Kitchen Myocardial stunning (HCC)    a. 11/2015: EF down to 20-25% after VF arrest but improved to ~68 by subsequent cardiac MR.  Marland Kitchen NICM (nonischemic cardiomyopathy) (HCC)   . Obesity   . PAF (paroxysmal atrial fibrillation) (HCC)    a. 11/2015 brief run of afib with RVR while intubated during a complicated  admission for VF arrest and VDRF. No recurrence on amio. CHADSVASC 2 (CHF, HTN) and on coumadin for HIT with positive thrombotic markers. If no long term recurrence, he may be able to come off OAC  . Ventricular tachycardia (HCC) 10/2015    Past Surgical History:  Procedure Laterality Date  . CARDIAC CATHETERIZATION N/A 11/16/2015   Procedure: Left Heart Cath and Coronary Angiography;  Surgeon: Peter M Swaziland, MD;  Location: [facility] INVASIVE CV LAB;  Service: Cardiovascular;  Laterality: N/A;  . EP IMPLANTABLE DEVICE N/A 12/01/2015   Procedure: ICD Implant;  Surgeon: Will [name], MD;  Location: [facility] INVASIVE CV LAB;  Service: Cardiovascular;  Laterality: N/A;    Current Medications: Current Meds  Medication Sig  . KLOR-CON M20 20 MEQ tablet TAKE 1 TABLET (20 MEQ TOTAL) BY MOUTH 2 (TWO) TIMES DAILY.  Marland Kitchen levothyroxine (SYNTHROID, LEVOTHROID) 112 MCG tablet Take 112 mcg by mouth daily before breakfast.  . losartan (COZAAR) 25 MG tablet TAKE 1 TABLET BY MOUTH EVERY DAY  . metoprolol succinate (TOPROL-XL) 25 MG 24 hr tablet Take 1 tablet (25 mg total) by mouth daily.  Marland Kitchen mexiletine (MEXITIL) 200 MG capsule Take 1 capsule (200 mg total) by mouth 2 (two) times daily.  . Multiple Vitamin (MULTIVITAMIN WITH MINERALS) TABS tablet Take 1 tablet by mouth daily. Centrum Silver  . pantoprazole (PROTONIX) 40 MG tablet TAKE 1 TABLET BY MOUTH EVERY DAY  . spironolactone (ALDACTONE) 25 MG tablet Take  1 tablet (25 mg total) by mouth daily.     Allergies:   Heparin   Social History   Socioeconomic History  . Marital status: Married    Spouse name: Not on file  . Number of children: Not on file  . Years of education: Not on file  . Highest education level: Not on file  Occupational History  . Not on file  Social Needs  . Financial resource strain: Not on file  . Food insecurity    Worry: Not on file    Inability: Not on file  . Transportation needs    Medical: Not on file    Non-medical: Not on  file  Tobacco Use  . Smoking status: Former Smoker    Types: Cigarettes  . Smokeless tobacco: Never Used  Substance and Sexual Activity  . Alcohol use: No  . Drug use: No  . Sexual activity: Not on file  Lifestyle  . Physical activity    Days per week: Not on file    Minutes per session: Not on file  . Stress: Not on file  Relationships  . Social Herbalist on phone: Not on file    Gets together: Not on file    Attends religious service: Not on file    Active member of club or organization: Not on file    Attends meetings of clubs or organizations: Not on file    Relationship status: Not on file  Other Topics Concern  . Not on file  Social History Narrative  . Not on file     Family History: The patient's family history includes Alzheimer's disease in his father; Heart attack in his mother.  ROS:   Please see the history of present illness.    All other systems reviewed and are negative.  EKGs/Labs/Other Studies Reviewed:    The following studies were reviewed today: Echocardiogram 11/15/2018: IMPRESSIONS    1. The left ventricle has normal systolic function, with an ejection fraction of 55-60%. The cavity size was normal. There is moderate asymmetric left ventricular hypertrophy. Left ventricular diastolic Doppler parameters are indeterminate.  2. The right ventricle has normal systolic function. The cavity was normal. There is no increase in right ventricular wall thickness.  3. Left atrial size was mildly dilated.  4. Right atrial size was mildly dilated.  5. Trivial pericardial effusion is present.  6. The aortic valve is grossly normal. No stenosis of the aortic valve.  7. The aorta is normal unless otherwise noted.  8. The aortic root and ascending aorta are normal in size and structure.  9. The atrial septum is grossly normal.  EKG:  EKG is not ordered today.    Recent Labs: 11/15/2018: BUN 13; Creatinine, Ser 0.86; Potassium 4.2; Sodium 142   Recent Lipid Panel No results found for: CHOL, TRIG, HDL, CHOLHDL, VLDL, LDLCALC, LDLDIRECT  Physical Exam:    VS:  BP 98/70   Pulse (!) 54   Ht 5\' 11"  (1.803 m)   Wt 214 lb 9.6 oz (97.3 kg)   SpO2 98%   BMI 29.93 kg/m     Wt Readings from Last 3 Encounters:  11/17/18 217 lb (98.4 kg)  11/15/18 214 lb 9.6 oz (97.3 kg)  05/05/18 235 lb 9.6 oz (106.9 kg)     GEN:  Well nourished, well developed in no acute distress HEENT: Normal NECK: No JVD; No carotid bruits LYMPHATICS: No lymphadenopathy CARDIAC: RRR, no murmurs, rubs, gallops RESPIRATORY:  Clear  to auscultation without rales, wheezing or rhonchi  ABDOMEN: Soft, non-tender, non-distended MUSCULOSKELETAL:  No edema; No deformity  SKIN: Warm and dry NEUROLOGIC:  Alert and oriented x 3 PSYCHIATRIC:  Normal affect   ASSESSMENT:    1. Nonischemic cardiomyopathy (HCC)    PLAN:    In order of problems listed above:  1. Patient appears stable.  His medication program is reviewed and no changes are made.  He has New York Heart Association functional class I symptoms.  His medical program is reviewed with a beta-blocker, ARB, and Spironolactone.  We will add a metabolic panel today to make sure his renal function and potassium are within expected limits.  I will see him back in 1 year for follow-up evaluation.  I did review his echocardiogram today which demonstrates normal function of his left ventricle with preserved LVEF.  He is noted to have moderate asymmetric hypertrophy without a significant outflow gradient.   Medication Adjustments/Labs and Tests Ordered: Current medicines are reviewed at length with the patient today.  Concerns regarding medicines are outlined above.  Orders Placed This Encounter  Procedures  . Basic metabolic panel   No orders of the defined types were placed in this encounter.   Patient Instructions  Medication Instructions:  Your provider recommends that you continue on your current  medications as directed. Please refer to the Current Medication list given to you today.    Labwork: TODAY: BMET  Testing/Procedures: None  Follow-Up: Your provider wants you to follow-up in: 1 year with Dr. Excell Seltzerooper. You will receive a reminder letter in the mail two months in advance. If you don't receive a letter, please call our office to schedule the follow-up appointment.       Signed, Tonny BollmanMichael Bich Mchaney, MD  11/20/2018 7:46 AM    [facility]

## 2018-11-15 NOTE — Patient Instructions (Signed)
Medication Instructions:  Your provider recommends that you continue on your current medications as directed. Please refer to the Current Medication list given to you today.    Labwork: TODAY: BMET  Testing/Procedures: None  Follow-Up: Your provider wants you to follow-up in: 1 year with Dr. Burt Knack. You will receive a reminder letter in the mail two months in advance. If you don't receive a letter, please call our office to schedule the follow-up appointment.

## 2018-11-16 ENCOUNTER — Encounter: Payer: Self-pay | Admitting: Cardiovascular Disease

## 2018-11-16 ENCOUNTER — Encounter: Payer: Self-pay | Admitting: Cardiology

## 2018-11-16 LAB — BASIC METABOLIC PANEL
BUN/Creatinine Ratio: 15 (ref 10–24)
BUN: 13 mg/dL (ref 8–27)
CO2: 25 mmol/L (ref 20–29)
Calcium: 9.8 mg/dL (ref 8.6–10.2)
Chloride: 103 mmol/L (ref 96–106)
Creatinine, Ser: 0.86 mg/dL (ref 0.76–1.27)
GFR calc Af Amer: 107 mL/min/{1.73_m2} (ref >59)
GFR calc non Af Amer: 92 mL/min/{1.73_m2} (ref >59)
Glucose: 84 mg/dL (ref 65–99)
Potassium: 4.2 mmol/L (ref 3.5–5.2)
Sodium: 142 mmol/L (ref 134–144)

## 2018-11-16 NOTE — Progress Notes (Signed)
Remote ICD transmission.   

## 2018-11-17 ENCOUNTER — Ambulatory Visit (INDEPENDENT_AMBULATORY_CARE_PROVIDER_SITE_OTHER): Payer: Managed Care, Other (non HMO) | Admitting: Cardiology

## 2018-11-17 ENCOUNTER — Other Ambulatory Visit: Payer: Self-pay

## 2018-11-17 ENCOUNTER — Encounter: Payer: Self-pay | Admitting: Cardiology

## 2018-11-17 VITALS — BP 102/70 | HR 59 | Ht 71.0 in | Wt 217.0 lb

## 2018-11-17 DIAGNOSIS — I472 Ventricular tachycardia, unspecified: Secondary | ICD-10-CM

## 2018-11-17 LAB — CUP PACEART INCLINIC DEVICE CHECK
Battery Remaining Longevity: 116 mo
Battery Voltage: 3.01 V
Brady Statistic RV Percent Paced: 0.01 %
Date Time Interrogation Session: 20200828164515
HighPow Impedance: 85 Ohm
Implantable Lead Implant Date: 20170911
Implantable Lead Location: 753860
Implantable Pulse Generator Implant Date: 20170911
Lead Channel Impedance Value: 380 Ohm
Lead Channel Impedance Value: 456 Ohm
Lead Channel Pacing Threshold Amplitude: 0.625 V
Lead Channel Pacing Threshold Pulse Width: 0.4 ms
Lead Channel Sensing Intrinsic Amplitude: 10.125 mV
Lead Channel Sensing Intrinsic Amplitude: 7.875 mV
Lead Channel Setting Pacing Amplitude: 2.5 V
Lead Channel Setting Pacing Pulse Width: 0.4 ms
Lead Channel Setting Sensing Sensitivity: 0.3 mV

## 2018-11-17 NOTE — Progress Notes (Signed)
Electrophysiology Office Note   Date:  11/17/2018   ID:  Arthur Chase, Arthur Chase 03-26-1955, MRN [id number]  PCP:  Shaune Pollack, MD (Inactive)  Cardiologist:  Excell Seltzer Primary Electrophysiologist:  Arthur Nolton [name], MD    No chief complaint on file.    History of Present Illness: Arthur Chase is a 63 y.o. male who presents today for electrophysiology evaluation.   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 He was admitted 11/16/15 with cardiac arrest and EMS strips demonstrating multiple episodes of VT/VF with a cycle length ~233msec. He was successfully rescuscitated - underwent a prolonged hospital admission s/p cooling. Underwent ICD implantation with a Medtronic ICD on 12/01/15. Was put on amiodarone. Had received ICD shocks in the past.  He was thus started on mexiletine.  Today, denies symptoms of palpitations, chest pain, shortness of breath, orthopnea, PND, lower extremity edema, claudication, dizziness, presyncope, syncope, bleeding, or neurologic sequela. The patient is tolerating medications without difficulties.    Past Medical History:  Diagnosis Date  . Anoxic encephalopathy (HCC) 10/2015   a. 10/2015 in setting of cardiac arrest - recovered prior to DC.  . Cardiac arrest with ventricular fibrillation (HCC)    a. 11/2015: Unclear etiology. Nl cors on cath 8/27. S/P cooling. On amiodarone and s/p ICD for secondary prevention on 12/01/15. EF down to 25-30% but normalized to 68 by subsequent cardiac MR  . HIT (heparin-induced thrombocytopenia) (HCC)    a. 11/2015 during admission for VF arrest. HIT panel positive. Placed on coumadin for 2 months  . HTN (hypertension)   .  Hypokalemia    a. 11/2015: resolved on K supplementation and spironolactone  . ICD (implantable cardioverter-defibrillator) in place    a. Medtronic Visia AF MRI (serial  Number [id number]) ICD.  Marland Kitchen Myocardial stunning (HCC)    a. 11/2015: EF down to 20-25% after VF arrest but improved to ~68 by subsequent cardiac MR.  Marland Kitchen NICM (nonischemic cardiomyopathy) (HCC)   . Obesity   . PAF (paroxysmal atrial fibrillation) (HCC)    a. 11/2015 brief run of afib with RVR while intubated during a complicated admission for VF arrest and VDRF. No recurrence on amio. CHADSVASC 2 (CHF, HTN) and on coumadin for HIT with positive thrombotic markers. If no long term recurrence, he may be able to come off OAC  . Ventricular tachycardia (HCC) 10/2015   Past Surgical History:  Procedure Laterality Date  . CARDIAC CATHETERIZATION N/A 11/16/2015   Procedure: Left Heart Cath and Coronary Angiography;  Surgeon: Peter M Swaziland, MD;  Location: [facility] INVASIVE CV LAB;  Service: Cardiovascular;  Laterality: N/A;  . EP IMPLANTABLE DEVICE N/A 12/01/2015   Procedure: ICD Implant;  Surgeon: Otila Starn [name], MD;  Location: [facility] INVASIVE CV LAB;  Service: Cardiovascular;  Laterality: N/A;     Current Outpatient Medications  Medication Sig Dispense Refill  . KLOR-CON M20 20 MEQ tablet TAKE 1 TABLET (20 MEQ TOTAL) BY MOUTH 2 (TWO) TIMES DAILY. 180 tablet 1  . levothyroxine (SYNTHROID, LEVOTHROID) 112 MCG tablet Take 112 mcg by mouth daily before breakfast.    . losartan (COZAAR) 25 MG tablet TAKE 1 TABLET BY MOUTH EVERY DAY 90 tablet 2  . metoprolol succinate (TOPROL-XL) 25 MG 24 hr tablet  Take 1 tablet (25 mg total) by mouth daily. 90 tablet 0  . mexiletine (MEXITIL) 200 MG capsule Take 1 capsule (200 mg total) by mouth 2 (two) times daily. 180 capsule 3  . Multiple Vitamin (MULTIVITAMIN WITH MINERALS) TABS tablet Take 1 tablet by mouth daily. Centrum Silver    . pantoprazole (PROTONIX) 40 MG tablet TAKE 1 TABLET BY MOUTH EVERY DAY  90 tablet 0  . spironolactone (ALDACTONE) 25 MG tablet Take 1 tablet (25 mg total) by mouth daily. 90 tablet 1   No current facility-administered medications for this visit.     Allergies:   Heparin   Social History:  The patient  reports that he has quit smoking. His smoking use included cigarettes. He has never used smokeless tobacco. He reports that he does not drink alcohol or use drugs.   Family History:  The patient's family history includes Alzheimer's disease in his father; Heart attack in his mother.   ROS:  Please see the history of present illness.   Otherwise, review of systems is positive for none.   All other systems are reviewed and negative.   PHYSICAL EXAM: VS:  BP 102/70   Pulse (!) 59   Ht 5\' 11"  (1.803 m)   Wt 217 lb (98.4 kg)   SpO2 98%   BMI 30.27 kg/m  , BMI Body mass index is 30.27 kg/m. GEN: Well nourished, well developed, in no acute distress  HEENT: normal  Neck: no JVD, carotid bruits, or masses Cardiac: RRR; no murmurs, rubs, or gallops,no edema  Respiratory:  clear to auscultation bilaterally, normal work of breathing GI: soft, nontender, nondistended, + BS MS: no deformity or atrophy  Skin: warm and dry, device site well healed Neuro:  Strength and sensation are intact Psych: euthymic mood, full affect  EKG:  EKG is ordered today. Personal review of the ekg ordered shows SR, rate 59  Personal review of the device interrogation today. Results in Paceart   Recent Labs: 11/15/2018: BUN 13; Creatinine, Ser 0.86; Potassium 4.2; Sodium 142    Lipid Panel  No results found for: CHOL, TRIG, HDL, CHOLHDL, VLDL, LDLCALC, LDLDIRECT   Wt Readings from Last 3 Encounters:  11/17/18 217 lb (98.4 kg)  11/15/18 214 lb 9.6 oz (97.3 kg)  05/05/18 235 lb 9.6 oz (106.9 kg)      Other studies Reviewed: Additional studies/ records that were reviewed today include: TTE 11/20/15, Cath 11/16/15  Review of the above records today demonstrates:  - Left  ventricle: The cavity size was moderately dilated. There was   mild concentric hypertrophy. Systolic function was severely   reduced. The estimated ejection fraction was in the range of 25%   to 30%. Diffuse hypokinesis. The study is not technically   sufficient to allow evaluation of LV diastolic function. - Aortic valve: Trileaflet; normal thickness leaflets. There was no   regurgitation. - Aortic root: The aortic root was normal in size. - Mitral valve: Structurally normal valve. There was no   regurgitation. - Left atrium: The atrium was moderately dilated. - Right ventricle: The cavity size was mildly dilated. Wall   thickness was normal. Systolic function was moderately reduced. - Right atrium: Pacer wire or catheter noted in right atrium. - Tricuspid valve: There was mild regurgitation. - Pulmonary arteries: Systolic pressure was within the normal   range. PA peak pressure: 33 mm Hg (S). - Inferior vena cava: The vessel was normal in size. - Pericardium, extracardiac: There was no pericardial effusion.  The left ventricular systolic function is normal.  LV end diastolic pressure is normal.  The left ventricular ejection fraction is 55-65% by visual estimate.   1. Normal coronary anatomy 2. Normal LV function  TTE 03/23/16 - Left ventricle: The cavity size was normal. There was mild   concentric hypertrophy. Systolic function was normal. The   estimated ejection fraction was in the range of 55% to 60%. Wall   motion was normal; there were no regional wall motion   abnormalities. Doppler parameters are consistent with abnormal   left ventricular relaxation (grade 1 diastolic dysfunction). - Mitral valve: Mild, late systolicprolapse, involving the anterior   leaflet. - Left atrium: The atrium was mildly dilated.  ASSESSMENT AND PLAN:  1. Cardiac arrest with VF: Status post Medtronic ICD.  Currently on mexiletine.  No further episodes of ventricular arrhythmias.  No  changes.  2. NICM -ejection fraction is since improved  3. Paroxysmal atrial fib: He on Coumadin.  Noted on device interrogation.  Sinus rhythm today.  This patients CHA2DS2-VASc Score and unadjusted Ischemic Stroke Rate (% per year) is equal to 2.2 % stroke rate/year from a score of 2  Above score calculated as 1 point each if present [CHF, HTN, DM, Vascular=MI/PAD/Aortic Plaque, Age if 65-74, or Male] Above score calculated as 2 points each if present [Age > 75, or Stroke/TIA/TE]  4. Hypertension: Currently well controlled  Current medicines are reviewed at length with the patient today.   The patient does not have concerns regarding his medicines.  The following changes were made today: none  Labs/ tests ordered today include:  No orders of the defined types were placed in this encounter.    Disposition:   FU with Taner Rzepka 6 months  Signed, Elon Lomeli Meredith Leeds, MD  11/17/2018 3:53 PM     [place] [address] [place] [address] [phone] (office) [phone] (fax)

## 2019-01-03 ENCOUNTER — Telehealth: Payer: Self-pay | Admitting: Cardiology

## 2019-01-03 ENCOUNTER — Other Ambulatory Visit: Payer: Self-pay | Admitting: Cardiovascular Disease

## 2019-01-03 MED ORDER — MEXILETINE HCL 200 MG PO CAPS: 200.0000 mg | ORAL_CAPSULE | Freq: Two times a day (BID) | ORAL | 2 refills | Status: DC

## 2019-01-03 MED ORDER — POTASSIUM CHLORIDE CRYS ER 20 MEQ PO TBCR: EXTENDED_RELEASE_TABLET | ORAL | 2 refills | Status: DC

## 2019-01-03 MED ORDER — LOSARTAN POTASSIUM 25 MG PO TABS: 25.0000 mg | ORAL_TABLET | Freq: Every day | ORAL | 2 refills | Status: DC

## 2019-01-03 MED ORDER — METOPROLOL SUCCINATE ER 25 MG PO TB24: 25.0000 mg | ORAL_TABLET | Freq: Every day | ORAL | 2 refills | Status: DC

## 2019-01-03 MED ORDER — PANTOPRAZOLE SODIUM 40 MG PO TBEC: 40.0000 mg | DELAYED_RELEASE_TABLET | Freq: Every day | ORAL | 2 refills | Status: DC

## 2019-01-03 MED ORDER — SPIRONOLACTONE 25 MG PO TABS: 25.0000 mg | ORAL_TABLET | Freq: Every day | ORAL | 2 refills | Status: DC

## 2019-01-03 NOTE — Telephone Encounter (Signed)
Pt's medications were resent to preferred pharmacy as requested. Confirmation received.  °

## 2019-01-03 NOTE — Telephone Encounter (Signed)
Patient would like to have all his prescription transferred over to [facility].

## 2019-01-03 NOTE — Telephone Encounter (Signed)
Pt's medication was sent to pt's pharmacy as requested. Confirmation received.  °

## 2019-01-03 NOTE — Telephone Encounter (Signed)
°*  STAT* If patient is at the pharmacy, call can be transferred to refill team.   1. Which medications need to be refilled? (please list name of each medication and dose if known) mexiletine (MEXITIL) 200 MG capsule  2. Which pharmacy/location (including street and city if local pharmacy) is medication to be sent to? [place], [place]  3. Do they need a 30 day or 90 day supply? 90 day

## 2019-01-22 ENCOUNTER — Other Ambulatory Visit: Payer: Self-pay | Admitting: Cardiology

## 2019-01-30 ENCOUNTER — Other Ambulatory Visit: Payer: Self-pay | Admitting: Cardiovascular Disease

## 2019-01-31 ENCOUNTER — Other Ambulatory Visit: Payer: Self-pay

## 2019-01-31 DIAGNOSIS — Z20822 Contact with and (suspected) exposure to covid-19: Secondary | ICD-10-CM

## 2019-02-03 LAB — NOVEL CORONAVIRUS, NAA: SARS-CoV-2, NAA: NOT DETECTED

## 2019-02-07 ENCOUNTER — Ambulatory Visit (INDEPENDENT_AMBULATORY_CARE_PROVIDER_SITE_OTHER): Payer: Managed Care, Other (non HMO) | Admitting: *Deleted

## 2019-02-07 DIAGNOSIS — I428 Other cardiomyopathies: Secondary | ICD-10-CM | POA: Diagnosis not present

## 2019-02-07 DIAGNOSIS — I5021 Acute systolic (congestive) heart failure: Secondary | ICD-10-CM

## 2019-02-07 LAB — CUP PACEART REMOTE DEVICE CHECK
Battery Remaining Longevity: 112 mo
Battery Voltage: 3.01 V
Brady Statistic RV Percent Paced: 0.01 %
Date Time Interrogation Session: 20201118083423
HighPow Impedance: 65 Ohm
Implantable Lead Implant Date: 20170911
Implantable Lead Location: 753860
Implantable Pulse Generator Implant Date: 20170911
Lead Channel Impedance Value: 323 Ohm
Lead Channel Impedance Value: 399 Ohm
Lead Channel Pacing Threshold Amplitude: 0.625 V
Lead Channel Pacing Threshold Pulse Width: 0.4 ms
Lead Channel Sensing Intrinsic Amplitude: 7.75 mV
Lead Channel Sensing Intrinsic Amplitude: 7.75 mV
Lead Channel Setting Pacing Amplitude: 2.5 V
Lead Channel Setting Pacing Pulse Width: 0.4 ms
Lead Channel Setting Sensing Sensitivity: 0.3 mV

## 2019-02-09 ENCOUNTER — Telehealth: Payer: Self-pay | Admitting: *Deleted

## 2019-02-09 MED ORDER — METOPROLOL SUCCINATE ER 100 MG PO TB24: 100.0000 mg | ORAL_TABLET | Freq: Every day | ORAL | 3 refills | Status: DC

## 2019-02-09 NOTE — Telephone Encounter (Signed)
Pt has been notified of device check recommendations per Dr. Curt Bears. Pt is agreeable to plan of care to increase Toprol XL to 100 mg daily, new Rx has been sent in. Pt thanked me for the call. Patient notified of result.  Please refer to phone note from today for complete details.   Julaine Hua, [facility] 02/09/2019 11:33 AM

## 2019-02-09 NOTE — Telephone Encounter (Signed)
-----   Message from Will Meredith Leeds, MD sent at 02/07/2019  1:07 PM EST ----- Abnormal device interrogation reviewed.  Lead parameters and battery status stable.  Short episodes of AF. Increase toprol xl to 100 mg daily

## 2019-03-06 ENCOUNTER — Other Ambulatory Visit: Payer: Self-pay | Admitting: Physician Assistant

## 2019-03-08 NOTE — Progress Notes (Signed)
Remote ICD transmission.   

## 2019-03-30 ENCOUNTER — Other Ambulatory Visit: Payer: Self-pay | Admitting: Cardiovascular Disease

## 2019-04-05 ENCOUNTER — Other Ambulatory Visit: Payer: Self-pay

## 2019-04-05 MED ORDER — MEXILETINE HCL 200 MG PO CAPS: 200.0000 mg | ORAL_CAPSULE | Freq: Two times a day (BID) | ORAL | 1 refills | Status: DC

## 2019-04-26 ENCOUNTER — Other Ambulatory Visit: Payer: Self-pay | Admitting: Cardiology

## 2019-05-11 ENCOUNTER — Ambulatory Visit (INDEPENDENT_AMBULATORY_CARE_PROVIDER_SITE_OTHER): Payer: Managed Care, Other (non HMO) | Admitting: *Deleted

## 2019-05-11 DIAGNOSIS — I428 Other cardiomyopathies: Secondary | ICD-10-CM

## 2019-05-11 LAB — CUP PACEART REMOTE DEVICE CHECK
Battery Remaining Longevity: 108 mo
Battery Voltage: 3.01 V
Brady Statistic RV Percent Paced: 0.03 %
Date Time Interrogation Session: 20210219033623
HighPow Impedance: 73 Ohm
Implantable Lead Implant Date: 20170911
Implantable Lead Location: 753860
Implantable Pulse Generator Implant Date: 20170911
Lead Channel Impedance Value: 342 Ohm
Lead Channel Impedance Value: 437 Ohm
Lead Channel Pacing Threshold Amplitude: 0.625 V
Lead Channel Pacing Threshold Pulse Width: 0.4 ms
Lead Channel Sensing Intrinsic Amplitude: 8.875 mV
Lead Channel Sensing Intrinsic Amplitude: 8.875 mV
Lead Channel Setting Pacing Amplitude: 2.5 V
Lead Channel Setting Pacing Pulse Width: 0.4 ms
Lead Channel Setting Sensing Sensitivity: 0.3 mV

## 2019-05-11 NOTE — Progress Notes (Signed)
ICD Remote  

## 2019-05-21 ENCOUNTER — Encounter: Payer: Self-pay | Admitting: Cardiology

## 2019-05-21 ENCOUNTER — Ambulatory Visit (INDEPENDENT_AMBULATORY_CARE_PROVIDER_SITE_OTHER): Payer: Managed Care, Other (non HMO) | Admitting: Cardiology

## 2019-05-21 ENCOUNTER — Other Ambulatory Visit: Payer: Self-pay

## 2019-05-21 VITALS — BP 110/72 | HR 59 | Ht 71.0 in | Wt 226.6 lb

## 2019-05-21 DIAGNOSIS — I4901 Ventricular fibrillation: Secondary | ICD-10-CM

## 2019-05-21 LAB — CUP PACEART INCLINIC DEVICE CHECK
Battery Remaining Longevity: 109 mo
Battery Voltage: 3 V
Brady Statistic RV Percent Paced: 0.03 %
Date Time Interrogation Session: 20210301165100
HighPow Impedance: 80 Ohm
Implantable Lead Implant Date: 20170911
Implantable Lead Location: 753860
Implantable Pulse Generator Implant Date: 20170911
Lead Channel Impedance Value: 380 Ohm
Lead Channel Impedance Value: 456 Ohm
Lead Channel Pacing Threshold Amplitude: 0.625 V
Lead Channel Pacing Threshold Pulse Width: 0.4 ms
Lead Channel Sensing Intrinsic Amplitude: 12.375 mV
Lead Channel Setting Pacing Amplitude: 2.5 V
Lead Channel Setting Pacing Pulse Width: 0.4 ms
Lead Channel Setting Sensing Sensitivity: 0.3 mV

## 2019-05-21 NOTE — Progress Notes (Signed)
Electrophysiology Office Note   Date:  05/21/2019   ID:  Arthur Chase, DOB 11/14/1955, MRN [id number]  PCP:  Arthur Austin, MD (Inactive)  Cardiologist:  Burt Knack Primary Electrophysiologist:  [place] Meredith Leeds, MD    No chief complaint on file.    History of Present Illness: Arthur Chase is a 64 y.o. male who presents today for electrophysiology evaluation.   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 He was admitted 11/16/15 with cardiac arrest and EMS strips demonstrating multiple episodes of VT/VF with a cycle length ~25msec. He was successfully rescuscitated - underwent a prolonged hospital admission s/p cooling. Underwent ICD implantation with a Medtronic ICD on 12/01/15. Was put on amiodarone. Had received ICD shocks in the past.  He was thus started on mexiletine.  Today, denies symptoms of palpitations, chest pain, shortness of breath, orthopnea, PND, lower extremity edema, claudication, dizziness, presyncope, syncope, bleeding, or neurologic sequela. The patient is tolerating medications without difficulties.  Overall feels well.  He has no chest pain or shortness of breath.  He is able to do all of his daily activities.  He is unaware of any further arrhythmias.  Past Medical History:  Diagnosis Date  . Anoxic encephalopathy ([place]) 10/2015   a. 10/2015 in setting of cardiac arrest - recovered prior to DC.  . Cardiac arrest with ventricular fibrillation ([place])    a. 11/2015: Unclear etiology. Nl cors on cath 8/27. S/P cooling. On amiodarone and s/p ICD for secondary prevention on 12/01/15. EF down to 25-30% but normalized to 68 by subsequent cardiac MR  . HIT  (heparin-induced thrombocytopenia) ([place])    a. 11/2015 during admission for VF arrest. HIT panel positive. Placed on coumadin for 2 months  . HTN (hypertension)   . Hypokalemia    a. 11/2015: resolved on K supplementation and spironolactone  . ICD (implantable cardioverter-defibrillator) in place    a. Medtronic Visia AF MRI (serial  Number [id number]) ICD.  Arthur Chase Myocardial stunning    a. 11/2015: EF down to 20-25% after VF arrest but improved to ~68 by subsequent cardiac MR.  Arthur Chase NICM (nonischemic cardiomyopathy) ([place])   . Obesity   . PAF (paroxysmal atrial fibrillation) ([place])    a. 11/2015 brief run of afib with RVR while intubated during a complicated admission for VF arrest and VDRF. No recurrence on amio. CHADSVASC 2 (CHF, HTN) and on coumadin for HIT with positive thrombotic markers. If no long term recurrence, he may be able to come off [place]  . Ventricular tachycardia ([place]) 10/2015   Past Surgical History:  Procedure Laterality Date  . CARDIAC CATHETERIZATION N/A 11/16/2015   Procedure: Left Heart Cath and Coronary Angiography;  Surgeon: Peter M Martinique, MD;  Location: [place] CV LAB;  Service: Cardiovascular;  Laterality: N/A;  . EP IMPLANTABLE DEVICE N/A 12/01/2015   Procedure: ICD Implant;  Surgeon: Charolette Bultman Meredith Leeds, MD;  Location: [place] CV LAB;  Service: Cardiovascular;  Laterality: N/A;     Current Outpatient Medications  Medication Sig Dispense Refill  . levothyroxine (SYNTHROID) 75 MCG tablet Take 75 mcg by mouth every morning.    Arthur Chase losartan (COZAAR) 25 MG tablet TAKE 1 TABLET BY MOUTH EVERY DAY 90 tablet 2  . metoprolol succinate (  TOPROL-XL) 100 MG 24 hr tablet Take 1 tablet (100 mg total) by mouth daily. 90 tablet 3  . mexiletine (MEXITIL) 200 MG capsule Take 1 capsule (200 mg total) by mouth 2 (two) times daily. 180 capsule 1  . Multiple Vitamin (MULTIVITAMIN WITH MINERALS) TABS tablet Take 1 tablet by mouth daily. Centrum Silver    . pantoprazole (PROTONIX) 40 MG  tablet TAKE 1 TABLET BY MOUTH EVERY DAY 90 tablet 3  . potassium chloride SA (KLOR-CON M20) 20 MEQ tablet TAKE 1 TABLET BY MOUTH TWICE A DAY 180 tablet 2  . spironolactone (ALDACTONE) 25 MG tablet TAKE 1 TABLET BY MOUTH EVERY DAY 90 tablet 2   No current facility-administered medications for this visit.    Allergies:   Heparin   Social History:  The patient  reports that he has quit smoking. His smoking use included cigarettes. He has never used smokeless tobacco. He reports that he does not drink alcohol or use drugs.   Family History:  The patient's family history includes Alzheimer's disease in his father; Heart attack in his mother.   ROS:  Please see the history of present illness.   Otherwise, review of systems is positive for none.   All other systems are reviewed and negative.   PHYSICAL EXAM: VS:  BP 110/72   Pulse (!) 59   Ht 5\' 11"  (1.803 m)   Wt 226 lb 9.6 oz (102.8 kg)   SpO2 96%   BMI 31.60 kg/m  , BMI Body mass index is 31.6 kg/m. GEN: Well nourished, well developed, in no acute distress  HEENT: normal  Neck: no JVD, carotid bruits, or masses Cardiac: RRR; no murmurs, rubs, or gallops,no edema  Respiratory:  clear to auscultation bilaterally, normal work of breathing GI: soft, nontender, nondistended, + BS MS: no deformity or atrophy  Skin: warm and dry, device site well healed Neuro:  Strength and sensation are intact Psych: euthymic mood, full affect  EKG:  EKG is ordered today. Personal review of the ekg ordered shows sinus rhythm, rate 59  Personal review of the device interrogation today. Results in Paceart   Recent Labs: 11/15/2018: BUN 13; Creatinine, Ser 0.86; Potassium 4.2; Sodium 142    Lipid Panel  No results found for: CHOL, TRIG, HDL, CHOLHDL, VLDL, LDLCALC, LDLDIRECT   Wt Readings from Last 3 Encounters:  05/21/19 226 lb 9.6 oz (102.8 kg)  11/17/18 217 lb (98.4 kg)  11/15/18 214 lb 9.6 oz (97.3 kg)      Other studies  Reviewed: Additional studies/ records that were reviewed today include: TTE 11/20/15, Cath 11/16/15  Review of the above records today demonstrates:  - Left ventricle: The cavity size was moderately dilated. There was   mild concentric hypertrophy. Systolic function was severely   reduced. The estimated ejection fraction was in the range of 25%   to 30%. Diffuse hypokinesis. The study is not technically   sufficient to allow evaluation of LV diastolic function. - Aortic valve: Trileaflet; normal thickness leaflets. There was no   regurgitation. - Aortic root: The aortic root was normal in size. - Mitral valve: Structurally normal valve. There was no   regurgitation. - Left atrium: The atrium was moderately dilated. - Right ventricle: The cavity size was mildly dilated. Wall   thickness was normal. Systolic function was moderately reduced. - Right atrium: Pacer wire or catheter noted in right atrium. - Tricuspid valve: There was mild regurgitation. - Pulmonary arteries: Systolic pressure was within the normal  range. PA peak pressure: 33 mm Hg (S). - Inferior vena cava: The vessel was normal in size. - Pericardium, extracardiac: There was no pericardial effusion.   The left ventricular systolic function is normal.  LV end diastolic pressure is normal.  The left ventricular ejection fraction is 55-65% by visual estimate.   1. Normal coronary anatomy 2. Normal LV function  TTE 03/23/16 - Left ventricle: The cavity size was normal. There was mild   concentric hypertrophy. Systolic function was normal. The   estimated ejection fraction was in the range of 55% to 60%. Wall   motion was normal; there were no regional wall motion   abnormalities. Doppler parameters are consistent with abnormal   left ventricular relaxation (grade 1 diastolic dysfunction). - Mitral valve: Mild, late systolicprolapse, involving the anterior   leaflet. - Left atrium: The atrium was mildly  dilated.  ASSESSMENT AND PLAN:  1. Cardiac arrest with VF: Status post Medtronic ICD.  Currently on mexiletine.  No further ventricular arrhythmias.  No changes.    2. NICM: Ejection fraction is fortunately improved.  3. Paroxysmal atrial fibrillation: Currently on Coumadin.  Noted on device interrogation.  CHA2DS2-VASc of 2.    4. Hypertension: well controlled  Current medicines are reviewed at length with the patient today.   The patient does not have concerns regarding his medicines.  The following changes were made today: none  Labs/ tests ordered today include:  Orders Placed This Encounter  Procedures  . EKG 12-Lead     Disposition:   FU with Johnell Bas 12 months  Signed, Mylin Gignac [name], MD  05/21/2019 3:51 PM     [facility] [address] [place] [address] [phone] (office) [phone] (fax)

## 2019-06-21 ENCOUNTER — Telehealth: Payer: Self-pay | Admitting: *Deleted

## 2019-06-21 MED ORDER — WARFARIN SODIUM 5 MG PO TABS: 5.0000 mg | ORAL_TABLET | Freq: Every day | ORAL | 0 refills | Status: DC

## 2019-06-21 NOTE — Telephone Encounter (Signed)
Pt scheduled to re-establish with Coumadin clinic next week. Aware to re-start Coumadin 5 mg once daily. He will start it tonight. Pt agreeable to plan.

## 2019-06-21 NOTE — Telephone Encounter (Signed)
-----   Message from Baird Lyons, RN sent at 05/30/2019  8:55 AM EST ----- Spoke to pt.  Pt last seen 3/1.  Dr. Elberta Fortis note states pt on Coumadin, but he was/is not.  Will forward back to Dr. Elberta Fortis for clarification/confirmation to re-start Coumadin. Pt aware I will call him back by next week once reviewed by Dr. Elberta Fortis.  Pt agreeable to plan

## 2019-06-28 ENCOUNTER — Other Ambulatory Visit: Payer: Self-pay

## 2019-06-28 ENCOUNTER — Ambulatory Visit (INDEPENDENT_AMBULATORY_CARE_PROVIDER_SITE_OTHER): Payer: Managed Care, Other (non HMO) | Admitting: Pharmacist

## 2019-06-28 DIAGNOSIS — D7582 Heparin induced thrombocytopenia (HIT): Secondary | ICD-10-CM | POA: Diagnosis not present

## 2019-06-28 DIAGNOSIS — I4891 Unspecified atrial fibrillation: Secondary | ICD-10-CM

## 2019-06-28 DIAGNOSIS — D75829 Heparin-induced thrombocytopenia, unspecified: Secondary | ICD-10-CM

## 2019-06-28 LAB — POCT INR: INR: 1.3 — AB (ref 2.0–3.0)

## 2019-06-28 NOTE — Patient Instructions (Signed)
Description   Take an extra 1/2 tablet today, then start taking 1 tablet daily except 1.5 tablets on Mondays, Wednesdays, and Fridays. Recheck INR in 1 week.

## 2019-07-05 ENCOUNTER — Ambulatory Visit (INDEPENDENT_AMBULATORY_CARE_PROVIDER_SITE_OTHER): Payer: Managed Care, Other (non HMO) | Admitting: *Deleted

## 2019-07-05 ENCOUNTER — Other Ambulatory Visit: Payer: Self-pay

## 2019-07-05 DIAGNOSIS — D7582 Heparin induced thrombocytopenia (HIT): Secondary | ICD-10-CM | POA: Diagnosis not present

## 2019-07-05 DIAGNOSIS — Z5181 Encounter for therapeutic drug level monitoring: Secondary | ICD-10-CM | POA: Diagnosis not present

## 2019-07-05 DIAGNOSIS — I4891 Unspecified atrial fibrillation: Secondary | ICD-10-CM | POA: Diagnosis not present

## 2019-07-05 DIAGNOSIS — D75829 Heparin-induced thrombocytopenia, unspecified: Secondary | ICD-10-CM

## 2019-07-05 LAB — POCT INR: INR: 1.9 — AB (ref 2.0–3.0)

## 2019-07-05 NOTE — Patient Instructions (Signed)
Description   Take an extra 1/2 tablet today, then start taking 1.5 tablets daily except 1 tablet on Tuesdays, Thursdays, and Saturdays. Recheck INR in 1 week.

## 2019-07-12 ENCOUNTER — Other Ambulatory Visit: Payer: Self-pay

## 2019-07-12 ENCOUNTER — Ambulatory Visit (INDEPENDENT_AMBULATORY_CARE_PROVIDER_SITE_OTHER): Payer: No Typology Code available for payment source | Admitting: *Deleted

## 2019-07-12 DIAGNOSIS — D7582 Heparin induced thrombocytopenia (HIT): Secondary | ICD-10-CM | POA: Diagnosis not present

## 2019-07-12 DIAGNOSIS — I4891 Unspecified atrial fibrillation: Secondary | ICD-10-CM

## 2019-07-12 DIAGNOSIS — D75829 Heparin-induced thrombocytopenia, unspecified: Secondary | ICD-10-CM

## 2019-07-12 DIAGNOSIS — Z5181 Encounter for therapeutic drug level monitoring: Secondary | ICD-10-CM

## 2019-07-12 LAB — POCT INR: INR: 2.2 (ref 2.0–3.0)

## 2019-07-12 NOTE — Patient Instructions (Signed)
Description   Continue taking 1.5 tablets daily except 1 tablet on Tuesdays, Thursdays, and Saturdays. Recheck INR in 2 weeks.

## 2019-07-26 ENCOUNTER — Other Ambulatory Visit: Payer: Self-pay

## 2019-07-26 ENCOUNTER — Ambulatory Visit (INDEPENDENT_AMBULATORY_CARE_PROVIDER_SITE_OTHER): Payer: No Typology Code available for payment source | Admitting: *Deleted

## 2019-07-26 DIAGNOSIS — D7582 Heparin induced thrombocytopenia (HIT): Secondary | ICD-10-CM | POA: Diagnosis not present

## 2019-07-26 DIAGNOSIS — I4891 Unspecified atrial fibrillation: Secondary | ICD-10-CM

## 2019-07-26 DIAGNOSIS — Z5181 Encounter for therapeutic drug level monitoring: Secondary | ICD-10-CM | POA: Diagnosis not present

## 2019-07-26 DIAGNOSIS — D75829 Heparin-induced thrombocytopenia, unspecified: Secondary | ICD-10-CM

## 2019-07-26 LAB — POCT INR: INR: 1.8 — AB (ref 2.0–3.0)

## 2019-07-26 NOTE — Patient Instructions (Signed)
Description   Start taking 1.5 tablets daily except 1 tablet on Tuesdays and Saturdays. Recheck INR in 2 weeks.

## 2019-08-08 ENCOUNTER — Ambulatory Visit (INDEPENDENT_AMBULATORY_CARE_PROVIDER_SITE_OTHER): Payer: Managed Care, Other (non HMO) | Admitting: *Deleted

## 2019-08-08 DIAGNOSIS — I469 Cardiac arrest, cause unspecified: Secondary | ICD-10-CM

## 2019-08-08 DIAGNOSIS — I480 Paroxysmal atrial fibrillation: Secondary | ICD-10-CM

## 2019-08-08 DIAGNOSIS — I4901 Ventricular fibrillation: Secondary | ICD-10-CM

## 2019-08-08 LAB — CUP PACEART REMOTE DEVICE CHECK
Battery Remaining Longevity: 104 mo
Battery Voltage: 3 V
Brady Statistic RV Percent Paced: 0.1 %
Date Time Interrogation Session: 20210519033426
HighPow Impedance: 75 Ohm
Implantable Lead Implant Date: 20170911
Implantable Lead Location: 753860
Implantable Pulse Generator Implant Date: 20170911
Lead Channel Impedance Value: 342 Ohm
Lead Channel Impedance Value: 437 Ohm
Lead Channel Pacing Threshold Amplitude: 0.625 V
Lead Channel Pacing Threshold Pulse Width: 0.4 ms
Lead Channel Sensing Intrinsic Amplitude: 11.125 mV
Lead Channel Sensing Intrinsic Amplitude: 11.125 mV
Lead Channel Setting Pacing Amplitude: 2.5 V
Lead Channel Setting Pacing Pulse Width: 0.4 ms
Lead Channel Setting Sensing Sensitivity: 0.3 mV

## 2019-08-10 ENCOUNTER — Ambulatory Visit (INDEPENDENT_AMBULATORY_CARE_PROVIDER_SITE_OTHER): Payer: No Typology Code available for payment source | Admitting: *Deleted

## 2019-08-10 ENCOUNTER — Other Ambulatory Visit: Payer: Self-pay

## 2019-08-10 DIAGNOSIS — Z5181 Encounter for therapeutic drug level monitoring: Secondary | ICD-10-CM

## 2019-08-10 DIAGNOSIS — I4891 Unspecified atrial fibrillation: Secondary | ICD-10-CM | POA: Diagnosis not present

## 2019-08-10 DIAGNOSIS — D75829 Heparin-induced thrombocytopenia, unspecified: Secondary | ICD-10-CM

## 2019-08-10 DIAGNOSIS — D7582 Heparin induced thrombocytopenia (HIT): Secondary | ICD-10-CM

## 2019-08-10 LAB — POCT INR: INR: 2.2 (ref 2.0–3.0)

## 2019-08-10 MED ORDER — WARFARIN SODIUM 5 MG PO TABS: ORAL_TABLET | ORAL | 2 refills | Status: DC

## 2019-08-10 NOTE — Progress Notes (Signed)
Remote ICD transmission.   

## 2019-08-10 NOTE — Patient Instructions (Addendum)
Description   Continue taking 1.5 tablets daily except 1 tablet on Tuesdays and Saturdays. Recheck INR in 3.5 weeks.Call coumadin clinic [phone]

## 2019-08-13 NOTE — Addendum Note (Signed)
Addended by: Geralyn Flash D on: 08/13/2019 10:09 AM   Modules accepted: Level of Service

## 2019-09-06 ENCOUNTER — Ambulatory Visit (INDEPENDENT_AMBULATORY_CARE_PROVIDER_SITE_OTHER): Payer: No Typology Code available for payment source | Admitting: *Deleted

## 2019-09-06 ENCOUNTER — Other Ambulatory Visit: Payer: Self-pay

## 2019-09-06 DIAGNOSIS — Z5181 Encounter for therapeutic drug level monitoring: Secondary | ICD-10-CM | POA: Diagnosis not present

## 2019-09-06 DIAGNOSIS — I4891 Unspecified atrial fibrillation: Secondary | ICD-10-CM

## 2019-09-06 DIAGNOSIS — D7582 Heparin induced thrombocytopenia (HIT): Secondary | ICD-10-CM | POA: Diagnosis not present

## 2019-09-06 DIAGNOSIS — D75829 Heparin-induced thrombocytopenia, unspecified: Secondary | ICD-10-CM

## 2019-09-06 LAB — POCT INR: INR: 1.9 — AB (ref 2.0–3.0)

## 2019-09-06 NOTE — Patient Instructions (Addendum)
Description   Today take extra 1/2 tablet then continue taking 1.5 tablets daily except 1 tablet on Tuesdays and Saturdays. Recheck INR in 3 weeks.Call coumadin clinic [phone]

## 2019-09-23 ENCOUNTER — Other Ambulatory Visit: Payer: Self-pay | Admitting: Cardiology

## 2019-09-27 ENCOUNTER — Other Ambulatory Visit: Payer: Self-pay

## 2019-09-27 ENCOUNTER — Ambulatory Visit (INDEPENDENT_AMBULATORY_CARE_PROVIDER_SITE_OTHER): Payer: No Typology Code available for payment source | Admitting: *Deleted

## 2019-09-27 DIAGNOSIS — D75829 Heparin-induced thrombocytopenia, unspecified: Secondary | ICD-10-CM

## 2019-09-27 DIAGNOSIS — I4891 Unspecified atrial fibrillation: Secondary | ICD-10-CM | POA: Diagnosis not present

## 2019-09-27 DIAGNOSIS — Z5181 Encounter for therapeutic drug level monitoring: Secondary | ICD-10-CM

## 2019-09-27 DIAGNOSIS — D7582 Heparin induced thrombocytopenia (HIT): Secondary | ICD-10-CM

## 2019-09-27 LAB — POCT INR: INR: 1.8 — AB (ref 2.0–3.0)

## 2019-09-27 NOTE — Patient Instructions (Signed)
Description   Take 2 tablets today and then start taking 1.5 tablets daily except for 1 tablet on Saturdays. Recheck INR in 2 weeks.Call coumadin clinic [phone]

## 2019-10-03 ENCOUNTER — Encounter: Payer: Self-pay | Admitting: Orthopaedic Surgery

## 2019-10-03 ENCOUNTER — Ambulatory Visit (INDEPENDENT_AMBULATORY_CARE_PROVIDER_SITE_OTHER): Payer: No Typology Code available for payment source

## 2019-10-03 ENCOUNTER — Ambulatory Visit (INDEPENDENT_AMBULATORY_CARE_PROVIDER_SITE_OTHER): Payer: No Typology Code available for payment source | Admitting: Orthopaedic Surgery

## 2019-10-03 VITALS — BP 114/83 | HR 72

## 2019-10-03 DIAGNOSIS — M25561 Pain in right knee: Secondary | ICD-10-CM | POA: Diagnosis not present

## 2019-10-03 DIAGNOSIS — G8929 Other chronic pain: Secondary | ICD-10-CM

## 2019-10-03 MED ORDER — METHYLPREDNISOLONE ACETATE 40 MG/ML IJ SUSP
40.0000 mg | INTRAMUSCULAR | Status: AC | PRN
  Administered 2019-10-03: 40 mg via INTRA_ARTICULAR

## 2019-10-03 MED ORDER — BUPIVACAINE HCL 0.25 % IJ SOLN
4.0000 mL | INTRAMUSCULAR | Status: AC | PRN
  Administered 2019-10-03: 4 mL via INTRA_ARTICULAR

## 2019-10-03 MED ORDER — LIDOCAINE HCL 1 % IJ SOLN
0.5000 mL | INTRAMUSCULAR | Status: AC | PRN
Start: 2019-10-03 — End: 2019-10-03
  Administered 2019-10-03: .5 mL

## 2019-10-03 NOTE — Progress Notes (Signed)
Office Visit Note   Patient: Arthur Chase           Date of Birth: 05/14/1955           MRN: [id number] Visit Date: 10/03/2019              Requested by: Shon Hale, MD [address] [place],  [place] [address] PCP: Shon Hale, MD   Assessment & Plan: Visit Diagnoses:  1. Chronic pain of right knee     Plan: Injection performed medial joint line.  Hopefully get some relief 57 persistent problems of call and let us know.  Follow-Up Instructions: No follow-ups on file.   Orders:  Orders Placed This Encounter  Procedures  . Large Joint Inj: R knee  . XR KNEE 3 VIEW RIGHT   No orders of the defined types were placed in this encounter.     Procedures: Large Joint Inj: R knee on 10/03/2019 10:06 AM Indications: pain and joint swelling Details: 22 G 1.5 in needle, anterolateral approach  Arthrogram: No  Medications: 40 mg methylPREDNISolone acetate 40 MG/ML; 0.5 mL lidocaine 1 %; 4 mL bupivacaine 0.25 % Outcome: tolerated well, no immediate complications Procedure, treatment alternatives, risks and benefits explained, specific risks discussed. Consent was given by the patient. Immediately prior to procedure a time out was called to verify the correct patient, procedure, equipment, support staff and site/side marked as required. Patient was prepped and draped in the usual sterile fashion.       Clinical Data: No additional findings.   Subjective: Chief Complaint  Patient presents with  . Right Knee - Pain    HPI Okay 64 year old new patient visit with ongoing pain in his right knee chronic.  He has pain primarily medial to been worse the last 2 months he has had difficulty sleeping.  He has been through therapy without helping previous injection.  Patient states he is cracking popping his knee has given way but he is able to catch himself and did not hit the floor.  He has been ambulatory and limping now for 2 months.  He is on Coumadin  chronically with history of atrial fib with rapid ventricular rate history of systolic heart failure, heparin-induced thrombocytopenia.  Cardiac arrest with V. fib with 1 week stay in intensive care.  Review of Systems noncontributory other than as mentioned in HPI all other systems.   Objective: Vital Signs: BP 114/83   Pulse 72   Physical Exam Constitutional:      Appearance: He is well-developed.  HENT:     Head: Normocephalic and atraumatic.  Eyes:     Pupils: Pupils are equal, round, and reactive to light.  Neck:     Thyroid: No thyromegaly.     Trachea: No tracheal deviation.  Cardiovascular:     Rate and Rhythm: Normal rate.  Pulmonary:     Effort: Pulmonary effort is normal.     Breath sounds: No wheezing.  Abdominal:     General: Bowel sounds are normal.     Palpations: Abdomen is soft.  Skin:    General: Skin is warm and dry.     Capillary Refill: Capillary refill takes less than 2 seconds.  Neurological:     Mental Status: He is alert and oriented to person, place, and time.  Psychiatric:        Behavior: Behavior normal.        Thought Content: Thought content normal.  Judgment: Judgment normal.     Ortho Exam negative logroll to the hips knees reach full extension.  He is amatory with pronounced knee limp.  Tender medial plica.  Distal pulses palpable.  Patient has tender medial plica which reproduces pain but also some medial joint line tenderness.  Some crepitus with patellofemoral loading and knee extension.  Specialty Comments:  No specialty comments available.  Imaging: XR KNEE 3 VIEW RIGHT  Result Date: 10/03/2019 AP lateral bilateral sunrise patellar x-rays obtained right knee.  This shows slight narrowing left knee medial joint line.  Left knee shows no significant narrowing no degenerative or acute changes. Impression: Normal right knee radiographs.  Slight left knee medial joint line narrowing.    PMFS History: Patient Active Problem  List   Diagnosis Date Noted  . Obesity   . NICM (nonischemic cardiomyopathy) (HCC)   . HIT (heparin-induced thrombocytopenia) (HCC)   . Cardiac arrest with ventricular fibrillation (HCC)   . HTN (hypertension)   . ICD (implantable cardioverter-defibrillator) in place   . PAF (paroxysmal atrial fibrillation) (HCC)   . Acute systolic CHF (congestive heart failure) (HCC) 11/25/2015  . Hypokalemia 11/25/2015  . NSVT (nonsustained ventricular tachycardia) (HCC) 11/25/2015  . Atrial fibrillation with rapid ventricular response (HCC) 11/20/2015  . Cardiogenic shock (HCC)   . Acute respiratory failure with hypoxemia (HCC) 11/16/2015  . Anoxic encephalopathy (HCC) 10/21/2015   Past Medical History:  Diagnosis Date  . Anoxic encephalopathy (HCC) 10/2015   a. 10/2015 in setting of cardiac arrest - recovered prior to DC.  . Cardiac arrest with ventricular fibrillation (HCC)    a. 11/2015: Unclear etiology. Nl cors on cath 8/27. S/P cooling. On amiodarone and s/p ICD for secondary prevention on 12/01/15. EF down to 25-30% but normalized to 68 by subsequent cardiac MR  . HIT (heparin-induced thrombocytopenia) (HCC)    a. 11/2015 during admission for VF arrest. HIT panel positive. Placed on coumadin for 2 months  . HTN (hypertension)   . Hypokalemia    a. 11/2015: resolved on K supplementation and spironolactone  . ICD (implantable cardioverter-defibrillator) in place    a. Medtronic Visia AF MRI (serial  Number [id number]) ICD.  Marland Kitchen Myocardial stunning    a. 11/2015: EF down to 20-25% after VF arrest but improved to ~68 by subsequent cardiac MR.  Marland Kitchen NICM (nonischemic cardiomyopathy) (HCC)   . Obesity   . PAF (paroxysmal atrial fibrillation) (HCC)    a. 11/2015 brief run of afib with RVR while intubated during a complicated admission for VF arrest and VDRF. No recurrence on amio. CHADSVASC 2 (CHF, HTN) and on coumadin for HIT with positive thrombotic markers. If no long term recurrence, he may be able to  come off OAC  . Ventricular tachycardia (HCC) 10/2015    Family History  Problem Relation Age of Onset  . Heart attack Mother   . Alzheimer's disease Father     Past Surgical History:  Procedure Laterality Date  . CARDIAC CATHETERIZATION N/A 11/16/2015   Procedure: Left Heart Cath and Coronary Angiography;  Surgeon: Peter M Swaziland, MD;  Location: [facility] INVASIVE CV LAB;  Service: Cardiovascular;  Laterality: N/A;  . EP IMPLANTABLE DEVICE N/A 12/01/2015   Procedure: ICD Implant;  Surgeon: Will [name], MD;  Location: [facility] INVASIVE CV LAB;  Service: Cardiovascular;  Laterality: N/A;   Social History   Occupational History  . Not on file  Tobacco Use  . Smoking status: Former Smoker    Types: Cigarettes  .  Smokeless tobacco: Never Used  Vaping Use  . Vaping Use: Never used  Substance and Sexual Activity  . Alcohol use: No  . Drug use: No  . Sexual activity: Not on file

## 2019-10-06 ENCOUNTER — Inpatient Hospital Stay (HOSPITAL_COMMUNITY)
Admission: EM | Admit: 2019-10-06 | Discharge: 2019-10-10 | DRG: 177 | Disposition: A | Discharge status: Home / Self Care | Payer: No Typology Code available for payment source | Attending: Internal Medicine | Admitting: Internal Medicine

## 2019-10-06 ENCOUNTER — Emergency Department (HOSPITAL_COMMUNITY): Payer: No Typology Code available for payment source

## 2019-10-06 ENCOUNTER — Encounter (HOSPITAL_COMMUNITY): Payer: Self-pay

## 2019-10-06 ENCOUNTER — Other Ambulatory Visit: Payer: Self-pay

## 2019-10-06 DIAGNOSIS — E669 Obesity, unspecified: Secondary | ICD-10-CM | POA: Diagnosis present

## 2019-10-06 DIAGNOSIS — Z79890 Hormone replacement therapy (postmenopausal): Secondary | ICD-10-CM

## 2019-10-06 DIAGNOSIS — U071 COVID-19: Principal | ICD-10-CM

## 2019-10-06 DIAGNOSIS — Z7901 Long term (current) use of anticoagulants: Secondary | ICD-10-CM

## 2019-10-06 DIAGNOSIS — D75829 Heparin-induced thrombocytopenia, unspecified: Secondary | ICD-10-CM | POA: Diagnosis present

## 2019-10-06 DIAGNOSIS — J9601 Acute respiratory failure with hypoxia: Secondary | ICD-10-CM | POA: Diagnosis present

## 2019-10-06 DIAGNOSIS — I5042 Chronic combined systolic (congestive) and diastolic (congestive) heart failure: Secondary | ICD-10-CM | POA: Diagnosis present

## 2019-10-06 DIAGNOSIS — Z79899 Other long term (current) drug therapy: Secondary | ICD-10-CM | POA: Diagnosis not present

## 2019-10-06 DIAGNOSIS — Z87891 Personal history of nicotine dependence: Secondary | ICD-10-CM

## 2019-10-06 DIAGNOSIS — Z713 Dietary counseling and surveillance: Secondary | ICD-10-CM

## 2019-10-06 DIAGNOSIS — I110 Hypertensive heart disease with heart failure: Secondary | ICD-10-CM | POA: Diagnosis present

## 2019-10-06 DIAGNOSIS — J1282 Pneumonia due to coronavirus disease 2019: Secondary | ICD-10-CM | POA: Diagnosis present

## 2019-10-06 DIAGNOSIS — E039 Hypothyroidism, unspecified: Secondary | ICD-10-CM | POA: Diagnosis present

## 2019-10-06 DIAGNOSIS — I4891 Unspecified atrial fibrillation: Secondary | ICD-10-CM | POA: Diagnosis present

## 2019-10-06 DIAGNOSIS — Z95810 Presence of automatic (implantable) cardiac defibrillator: Secondary | ICD-10-CM

## 2019-10-06 DIAGNOSIS — I480 Paroxysmal atrial fibrillation: Secondary | ICD-10-CM | POA: Diagnosis present

## 2019-10-06 DIAGNOSIS — Z8249 Family history of ischemic heart disease and other diseases of the circulatory system: Secondary | ICD-10-CM

## 2019-10-06 DIAGNOSIS — I428 Other cardiomyopathies: Secondary | ICD-10-CM | POA: Diagnosis present

## 2019-10-06 DIAGNOSIS — Z8674 Personal history of sudden cardiac arrest: Secondary | ICD-10-CM | POA: Diagnosis not present

## 2019-10-06 DIAGNOSIS — Z6830 Body mass index (BMI) 30.0-30.9, adult: Secondary | ICD-10-CM | POA: Diagnosis not present

## 2019-10-06 DIAGNOSIS — I5031 Acute diastolic (congestive) heart failure: Secondary | ICD-10-CM | POA: Diagnosis not present

## 2019-10-06 DIAGNOSIS — J189 Pneumonia, unspecified organism: Secondary | ICD-10-CM

## 2019-10-06 DIAGNOSIS — I10 Essential (primary) hypertension: Secondary | ICD-10-CM | POA: Diagnosis present

## 2019-10-06 DIAGNOSIS — Z888 Allergy status to other drugs, medicaments and biological substances status: Secondary | ICD-10-CM | POA: Diagnosis not present

## 2019-10-06 DIAGNOSIS — Z22322 Carrier or suspected carrier of Methicillin resistant Staphylococcus aureus: Secondary | ICD-10-CM

## 2019-10-06 HISTORY — DX: COVID-19: U07.1

## 2019-10-06 LAB — CBC WITH DIFFERENTIAL/PLATELET
Abs Immature Granulocytes: 0.04 10*3/uL (ref 0.00–0.07)
Basophils Absolute: 0 10*3/uL (ref 0.0–0.1)
Basophils Relative: 0 %
Eosinophils Absolute: 0 10*3/uL (ref 0.0–0.5)
Eosinophils Relative: 0 %
HCT: 51.9 % (ref 39.0–52.0)
Hemoglobin: 17.3 g/dL — ABNORMAL HIGH (ref 13.0–17.0)
Immature Granulocytes: 1 %
Lymphocytes Relative: 31 %
Lymphs Abs: 2.4 10*3/uL (ref 0.7–4.0)
MCH: 31 pg (ref 26.0–34.0)
MCHC: 33.3 g/dL (ref 30.0–36.0)
MCV: 93 fL (ref 80.0–100.0)
Monocytes Absolute: 0.5 10*3/uL (ref 0.1–1.0)
Monocytes Relative: 6 %
Neutro Abs: 4.9 10*3/uL (ref 1.7–7.7)
Neutrophils Relative %: 62 %
Platelets: 225 10*3/uL (ref 150–400)
RBC: 5.58 MIL/uL (ref 4.22–5.81)
RDW: 12.9 % (ref 11.5–15.5)
WBC: 7.9 10*3/uL (ref 4.0–10.5)
nRBC: 0 % (ref 0.0–0.2)

## 2019-10-06 LAB — COMPREHENSIVE METABOLIC PANEL
ALT: 25 U/L (ref 0–44)
AST: 36 U/L (ref 15–41)
Albumin: 3.7 g/dL (ref 3.5–5.0)
Alkaline Phosphatase: 70 U/L (ref 38–126)
Anion gap: 14 (ref 5–15)
BUN: 10 mg/dL (ref 8–23)
CO2: 17 mmol/L — ABNORMAL LOW (ref 22–32)
Calcium: 8.7 mg/dL — ABNORMAL LOW (ref 8.9–10.3)
Chloride: 103 mmol/L (ref 98–111)
Creatinine, Ser: 0.87 mg/dL (ref 0.61–1.24)
GFR calc Af Amer: >60 mL/min (ref >60)
GFR calc non Af Amer: >60 mL/min (ref >60)
Glucose, Bld: 105 mg/dL — ABNORMAL HIGH (ref 70–99)
Potassium: 4.4 mmol/L (ref 3.5–5.1)
Sodium: 134 mmol/L — ABNORMAL LOW (ref 135–145)
Total Bilirubin: 0.8 mg/dL (ref 0.3–1.2)
Total Protein: 7.3 g/dL (ref 6.5–8.1)

## 2019-10-06 LAB — HIV ANTIBODY (ROUTINE TESTING W REFLEX): HIV Screen 4th Generation wRfx: NONREACTIVE

## 2019-10-06 LAB — MAGNESIUM: Magnesium: 2.3 mg/dL (ref 1.7–2.4)

## 2019-10-06 LAB — PROTIME-INR
INR: 2.2 — ABNORMAL HIGH (ref 0.8–1.2)
Prothrombin Time: 23.7 seconds — ABNORMAL HIGH (ref 11.4–15.2)

## 2019-10-06 LAB — D-DIMER, QUANTITATIVE: D-Dimer, Quant: 1.08 ug/mL-FEU — ABNORMAL HIGH (ref 0.00–0.50)

## 2019-10-06 LAB — TRIGLYCERIDES: Triglycerides: 136 mg/dL (ref <150)

## 2019-10-06 LAB — LACTIC ACID, PLASMA: Lactic Acid, Venous: 1.6 mmol/L (ref 0.5–1.9)

## 2019-10-06 LAB — FERRITIN: Ferritin: 858 ng/mL — ABNORMAL HIGH (ref 24–336)

## 2019-10-06 LAB — C-REACTIVE PROTEIN: CRP: 4.2 mg/dL — ABNORMAL HIGH (ref <1.0)

## 2019-10-06 LAB — TSH: TSH: 3.762 u[IU]/mL (ref 0.350–4.500)

## 2019-10-06 LAB — LACTATE DEHYDROGENASE: LDH: 235 U/L — ABNORMAL HIGH (ref 98–192)

## 2019-10-06 LAB — PROCALCITONIN: Procalcitonin: <0.1 ng/mL

## 2019-10-06 LAB — BRAIN NATRIURETIC PEPTIDE: B Natriuretic Peptide: 353.4 pg/mL — ABNORMAL HIGH (ref 0.0–100.0)

## 2019-10-06 LAB — FIBRINOGEN: Fibrinogen: 720 mg/dL — ABNORMAL HIGH (ref 210–475)

## 2019-10-06 LAB — SARS CORONAVIRUS 2 BY RT PCR (HOSPITAL ORDER, PERFORMED IN ~~LOC~~ HOSPITAL LAB): SARS Coronavirus 2: POSITIVE — AB

## 2019-10-06 MED ORDER — BISACODYL 5 MG PO TBEC: 5.0000 mg | DELAYED_RELEASE_TABLET | Freq: Every day | ORAL | Status: DC | PRN

## 2019-10-06 MED ORDER — DILTIAZEM HCL 25 MG/5ML IV SOLN
10.0000 mg | Freq: Once | INTRAVENOUS | Status: AC
  Administered 2019-10-06: 10 mg via INTRAVENOUS
  Filled 2019-10-06: qty 5

## 2019-10-06 MED ORDER — DILTIAZEM HCL-DEXTROSE 125-5 MG/125ML-% IV SOLN (PREMIX)
5.0000 mg/h | INTRAVENOUS | Status: DC
  Administered 2019-10-06: 5 mg/h via INTRAVENOUS
  Administered 2019-10-07: 10 mg/h via INTRAVENOUS
  Filled 2019-10-06 (×3): qty 125

## 2019-10-06 MED ORDER — GUAIFENESIN-DM 100-10 MG/5ML PO SYRP
10.0000 mL | ORAL_SOLUTION | ORAL | Status: DC | PRN
  Administered 2019-10-07: 10 mL via ORAL
  Filled 2019-10-06: qty 10

## 2019-10-06 MED ORDER — DEXAMETHASONE SODIUM PHOSPHATE 10 MG/ML IJ SOLN
6.0000 mg | INTRAMUSCULAR | Status: DC
  Administered 2019-10-06 – 2019-10-09 (×4): 6 mg via INTRAVENOUS
  Filled 2019-10-06 (×5): qty 1

## 2019-10-06 MED ORDER — DILTIAZEM LOAD VIA INFUSION
10.0000 mg | Freq: Once | INTRAVENOUS | Status: AC
  Administered 2019-10-06: 10 mg via INTRAVENOUS
  Filled 2019-10-06: qty 10

## 2019-10-06 MED ORDER — POLYETHYLENE GLYCOL 3350 17 G PO PACK: 17.0000 g | PACK | Freq: Every day | ORAL | Status: DC | PRN

## 2019-10-06 MED ORDER — OXYCODONE HCL 5 MG PO TABS: 5.0000 mg | ORAL_TABLET | ORAL | Status: DC | PRN

## 2019-10-06 MED ORDER — LOSARTAN POTASSIUM 50 MG PO TABS
25.0000 mg | ORAL_TABLET | Freq: Every day | ORAL | Status: DC
  Administered 2019-10-07 – 2019-10-09 (×3): 25 mg via ORAL
  Filled 2019-10-06 (×3): qty 1

## 2019-10-06 MED ORDER — DILTIAZEM HCL-DEXTROSE 125-5 MG/125ML-% IV SOLN (PREMIX)
5.0000 mg/h | INTRAVENOUS | Status: DC
  Administered 2019-10-06: 5 mg/h via INTRAVENOUS
  Filled 2019-10-06: qty 125

## 2019-10-06 MED ORDER — SODIUM CHLORIDE 0.9 % IV SOLN: 250.0000 mL | INTRAVENOUS | Status: DC | PRN

## 2019-10-06 MED ORDER — SODIUM CHLORIDE 0.9% FLUSH: 3.0000 mL | Freq: Two times a day (BID) | INTRAVENOUS | Status: DC

## 2019-10-06 MED ORDER — HYDROCOD POLST-CPM POLST ER 10-8 MG/5ML PO SUER: 5.0000 mL | Freq: Two times a day (BID) | ORAL | Status: DC | PRN

## 2019-10-06 MED ORDER — ONDANSETRON HCL 4 MG/2ML IJ SOLN: 4.0000 mg | Freq: Four times a day (QID) | INTRAMUSCULAR | Status: DC | PRN

## 2019-10-06 MED ORDER — FLEET ENEMA 7-19 GM/118ML RE ENEM: 1.0000 | ENEMA | Freq: Once | RECTAL | Status: DC | PRN

## 2019-10-06 MED ORDER — SODIUM CHLORIDE 0.9% FLUSH: 3.0000 mL | INTRAVENOUS | Status: DC | PRN

## 2019-10-06 MED ORDER — METOPROLOL SUCCINATE ER 100 MG PO TB24: 100.0000 mg | ORAL_TABLET | Freq: Every day | ORAL | Status: DC

## 2019-10-06 MED ORDER — MEXILETINE HCL 200 MG PO CAPS
200.0000 mg | ORAL_CAPSULE | Freq: Two times a day (BID) | ORAL | Status: DC
  Administered 2019-10-06 – 2019-10-10 (×8): 200 mg via ORAL
  Filled 2019-10-06 (×10): qty 1

## 2019-10-06 MED ORDER — WARFARIN - PHARMACIST DOSING INPATIENT: Freq: Every day | Status: DC

## 2019-10-06 MED ORDER — ONDANSETRON HCL 4 MG PO TABS: 4.0000 mg | ORAL_TABLET | Freq: Four times a day (QID) | ORAL | Status: DC | PRN

## 2019-10-06 MED ORDER — SODIUM CHLORIDE 0.9 % IV SOLN
200.0000 mg | Freq: Once | INTRAVENOUS | Status: AC
  Administered 2019-10-06: 200 mg via INTRAVENOUS
  Filled 2019-10-06: qty 40

## 2019-10-06 MED ORDER — PANTOPRAZOLE SODIUM 40 MG PO TBEC
40.0000 mg | DELAYED_RELEASE_TABLET | Freq: Every day | ORAL | Status: DC
  Administered 2019-10-07 – 2019-10-10 (×4): 40 mg via ORAL
  Filled 2019-10-06 (×4): qty 1

## 2019-10-06 MED ORDER — SODIUM CHLORIDE 0.9 % IV SOLN
100.0000 mg | Freq: Every day | INTRAVENOUS | Status: AC
  Administered 2019-10-07 – 2019-10-10 (×4): 100 mg via INTRAVENOUS
  Filled 2019-10-06 (×4): qty 20

## 2019-10-06 MED ORDER — ACETAMINOPHEN 325 MG PO TABS: 650.0000 mg | ORAL_TABLET | Freq: Four times a day (QID) | ORAL | Status: DC | PRN

## 2019-10-06 MED ORDER — ALBUTEROL SULFATE HFA 108 (90 BASE) MCG/ACT IN AERS
2.0000 | INHALATION_SPRAY | RESPIRATORY_TRACT | Status: DC | PRN
  Filled 2019-10-06: qty 6.7

## 2019-10-06 MED ORDER — LEVOTHYROXINE SODIUM 75 MCG PO TABS
75.0000 ug | ORAL_TABLET | Freq: Every day | ORAL | Status: DC
  Administered 2019-10-07 – 2019-10-10 (×4): 75 ug via ORAL
  Filled 2019-10-06 (×4): qty 1

## 2019-10-06 NOTE — ED Notes (Signed)
Pressures are outside of titratable window. Keeping medication at 5mg  hr.

## 2019-10-06 NOTE — ED Provider Notes (Signed)
[facility] EMERGENCY DEPARTMENT Provider Note   CSN: [id number] Arrival date & time: 10/06/19  1309     History Chief Complaint  Patient presents with  . Shortness of Breath  . Cough    Arthur Chase is a 64 y.o. male.  Who presents to the ER with cough and shortness of breath.  He has a past medical history of V. fib cardiac arrest status post AICD placement, hypertension, HIT, paroxysmal atrial fibrillation, V. Tach.  Patient has had 4 days of cough, shortness of breath.  His cough is very painful.  He feels extremely lightheaded.  Patient states that he gets extremely short of breath with exertion.  This is new for him.  He denies leg swelling.  He states that he has body aches, chills.  He denies loss of sense of taste or smell.  He states this feels like previous pneumonia.  He denies racing or skipping in his heart.  He quit smoking 26 years ago.  He states that normally when he gets chest congestion he can just tough it out but this is a lot worse than normal for him.  HPI     Past Medical History:  Diagnosis Date  . Anoxic encephalopathy (HCC) 10/2015   a. 10/2015 in setting of cardiac arrest - recovered prior to DC.  . Cardiac arrest with ventricular fibrillation (HCC)    a. 11/2015: Unclear etiology. Nl cors on cath 8/27. S/P cooling. On amiodarone and s/p ICD for secondary prevention on 12/01/15. EF down to 25-30% but normalized to 68 by subsequent cardiac MR  . HIT (heparin-induced thrombocytopenia) (HCC)    a. 11/2015 during admission for VF arrest. HIT panel positive. Placed on coumadin for 2 months  . HTN (hypertension)   . Hypokalemia    a. 11/2015: resolved on K supplementation and spironolactone  . ICD (implantable cardioverter-defibrillator) in place    a. Medtronic Visia AF MRI (serial  Number [id number]) ICD.  Marland Kitchen Myocardial stunning    a. 11/2015: EF down to 20-25% after VF arrest but improved to ~68 by subsequent cardiac MR.  Marland Kitchen NICM  (nonischemic cardiomyopathy) (HCC)   . Obesity   . PAF (paroxysmal atrial fibrillation) (HCC)    a. 11/2015 brief run of afib with RVR while intubated during a complicated admission for VF arrest and VDRF. No recurrence on amio. CHADSVASC 2 (CHF, HTN) and on coumadin for HIT with positive thrombotic markers. If no long term recurrence, he may be able to come off OAC  . Ventricular tachycardia (HCC) 10/2015    Patient Active Problem List   Diagnosis Date Noted  . Obesity   . NICM (nonischemic cardiomyopathy) (HCC)   . HIT (heparin-induced thrombocytopenia) (HCC)   . Cardiac arrest with ventricular fibrillation (HCC)   . HTN (hypertension)   . ICD (implantable cardioverter-defibrillator) in place   . PAF (paroxysmal atrial fibrillation) (HCC)   . Acute systolic CHF (congestive heart failure) (HCC) 11/25/2015  . Hypokalemia 11/25/2015  . NSVT (nonsustained ventricular tachycardia) (HCC) 11/25/2015  . Atrial fibrillation with rapid ventricular response (HCC) 11/20/2015  . Cardiogenic shock (HCC)   . Acute respiratory failure with hypoxemia (HCC) 11/16/2015  . Anoxic encephalopathy (HCC) 10/21/2015    Past Surgical History:  Procedure Laterality Date  . CARDIAC CATHETERIZATION N/A 11/16/2015   Procedure: Left Heart Cath and Coronary Angiography;  Surgeon: Peter M Swaziland, MD;  Location[facility] IN[facility]AS[facility] CV LAB;  Service: Cardiovascular;  Laterality: N/A;  . EP IMPLANTABLE DEVICE  N/A 12/01/2015   Procedure: ICD Implant;  Surgeon: Will [name], MD;  Location: [facility] INVASIVE CV LAB;  Service: Cardiovascular;  Laterality: N/A;       Family History  Problem Relation Age of Onset  . Heart attack Mother   . Alzheimer's disease Father     Social History   Tobacco Use  . Smoking status: Former Smoker    Types: Cigarettes  . Smokeless tobacco: Never Used  Vaping Use  . Vaping Use: Never used  Substance Use Topics  . Alcohol use: No  . Drug use: No    Home Medications Prior to  Admission medications   Medication Sig Start Date End Date Taking? Authorizing Provider  levothyroxine (SYNTHROID) 75 MCG tablet Take 75 mcg by mouth every morning. 04/25/19   [provider]  losartan (COZAAR) 25 MG tablet TAKE 1 TABLET BY MOUTH EVERY DAY 03/06/19   Tonny Bollman, MD  metoprolol succinate (TOPROL-XL) 100 MG 24 hr tablet Take 1 tablet (100 mg total) by mouth daily. 02/09/19   Camnitz, Andree Coss, MD  mexiletine (MEXITIL) 200 MG capsule TAKE 1 CAPSULE BY MOUTH TWICE A DAY 09/25/19   Camnitz, Andree Coss, MD  Multiple Vitamin (MULTIVITAMIN WITH MINERALS) TABS tablet Take 1 tablet by mouth daily. Centrum Silver    [provider]  pantoprazole (PROTONIX) 40 MG tablet TAKE 1 TABLET BY MOUTH EVERY DAY 01/30/19   Tonny Bollman, MD  potassium chloride SA (KLOR-CON M20) 20 MEQ tablet TAKE 1 TABLET BY MOUTH TWICE A DAY 03/30/19   Tonny Bollman, MD  spironolactone (ALDACTONE) 25 MG tablet TAKE 1 TABLET BY MOUTH EVERY DAY 04/26/19   Camnitz, Andree Coss, MD  warfarin (COUMADIN) 5 MG tablet Take 1 to 1.5 tablets by mouth daily as directed by the coumadin clinic 08/10/19   Camnitz, Andree Coss, MD    Allergies    Heparin  Review of Systems   Review of Systems Ten systems reviewed and are negative for acute change, except as noted in the HPI.   Physical Exam Updated Vital Signs BP 104/73 (BP Location: Right Arm)   Pulse 68   Temp 98.6 F (37 C) (Oral)   Resp (!) 25   Ht 5\' 11"  (1.803 m)   Wt 98.4 kg   SpO2 96%   BMI 30.27 kg/m   Physical Exam Vitals and nursing note reviewed.  Constitutional:      Appearance: He is ill-appearing.  HENT:     Head: Normocephalic and atraumatic.  Eyes:     Extraocular Movements: Extraocular movements intact.     Pupils: Pupils are equal, round, and reactive to light.  Neck:     Vascular: No JVD.  Cardiovascular:     Rate and Rhythm: Tachycardia present.     Comments: Irregularly irregular rhythm at a rate of about  160 Pulmonary:     Effort: Tachypnea present.     Breath sounds: Examination of the right-middle field reveals rales. Examination of the left-middle field reveals rales. Examination of the right-lower field reveals rales. Examination of the left-lower field reveals rales. Rales (fine crackles) present.  Abdominal:     Palpations: Abdomen is soft.  Musculoskeletal:     Right lower leg: No edema.     Left lower leg: No edema.  Skin:    General: Skin is warm and dry.  Neurological:     Mental Status: He is alert and oriented to person, place, and time.     ED Results /  Procedures / Treatments   Labs (all labs ordered are listed, but only abnormal results are displayed) Labs Reviewed  SARS CORONAVIRUS 2 BY RT PCR (HOSPITAL ORDER, PERFORMED IN [facility])  CULTURE, BLOOD (ROUTINE X 2)  CULTURE, BLOOD (ROUTINE X 2)  LACTIC ACID, PLASMA  LACTIC ACID, PLASMA  CBC WITH DIFFERENTIAL/PLATELET  COMPREHENSIVE METABOLIC PANEL  D-DIMER, QUANTITATIVE (NOT AT [facility])  PROCALCITONIN  LACTATE DEHYDROGENASE  FERRITIN  FIBRINOGEN  C-REACTIVE PROTEIN  BRAIN NATRIURETIC PEPTIDE  MAGNESIUM  TRIGLYCERIDES  TSH    EKG None  Radiology No results found.  Procedures .Critical Care Performed by: Arthor Captain, PA-C Authorized by: Arthor Captain, PA-C   Critical care provider statement:    Critical care time (minutes):  45   Critical care time was exclusive of:  Separately billable procedures and treating other patients   Critical care was necessary to treat or prevent imminent or life-threatening deterioration of the following conditions:  Cardiac failure   Critical care was time spent personally by me on the following activities:  Discussions with consultants, evaluation of patient's response to treatment, examination of patient, ordering and performing treatments and interventions, ordering and review of laboratory studies, ordering and review of radiographic studies, pulse  oximetry, re-evaluation of patient's condition, obtaining history from patient or surrogate and review of old charts   (including critical care time)  Medications Ordered in ED Medications  diltiazem (CARDIZEM) injection 10 mg (has no administration in time range)    ED Course  I have reviewed the triage vital signs and the nursing notes.  Pertinent labs & imaging results that were available during my care of the patient were reviewed by me and considered in my medical decision making (see chart for details).    MDM Rules/Calculators/A&P                         CC: Cough, shortness of breath VS:  Vitals:   10/06/19 1331 10/06/19 1333 10/06/19 1540 10/06/19 1545  BP: 104/73  95/61 105/75  Pulse: 68  (!) 58   Resp: (!) 25  (!) 23   Temp: 98.6 F (37 C)     TempSrc: Oral     SpO2: 96%  97%   Weight:  98.4 kg    Height:  5\' 11"  (1.803 m)      is gathered by patient and EMR. Previous records obtained and reviewed. DDX:The patient's complaint of shortness of breath involves an extensive number of diagnostic and treatment options, and is a complaint that carries with it a high risk of complications, morbidity, and potential mortality. Given the large differential diagnosis, medical decision making is of high complexity. The emergent differential diagnosis for shortness of breath includes, but is not limited to, Pulmonary edema, bronchoconstriction, Pneumonia, Pulmonary embolism, Pneumotherax/ Hemothorax, Dysrythmia, ACS.   Labs: I ordered reviewed and interpreted labs which include inflammatory markers, ferritin, D-dimer, fibrinogen, C-reactive protein and LDH elevated.  Triglycerides are within normal limits.  TSH within normal limits, normal pro calcitonin.  CMP shows mildly low sodium of insignificant value.  CBC without elevated white blood cell count.  BNP is just mildly elevated.  Patient's Covid test is pending. Imaging: I ordered and reviewed images which included 1  view chest x-ray. I independently visualized and interpreted all imaging. Significant findings include left lower lobe infiltrate.  ED ECG REPORT   Date: 10/06/2019  Rate: 141  Rhythm: atrial fibrillation  QRS Axis: normal  Intervals: normal  ST/T Wave abnormalities: normal  Conduction Disutrbances:none  Narrative Interpretation:   Old EKG Reviewed: changes noted   Consults:Dr. Myrene Buddy MDM: 64 year old male here with worsening shortness of breath, cough, chills.  He has history of heart disease and A. fib.  Currently in A. fib with RVR.  Patient was given a low dose of diltiazem as his pressures have been soft.  His heart rate dropped down to the 120s but then rebounded.  Patient placed on diltiazem drip.  Patient also with pneumonia.  Have high suspicion for Covid given rising rates here in the local area. His Covid test is currently pending.  Patient will be admitted by the hospitalist service Patient disposition: Admit The patient appears reasonably screened and/or stabilized for discharge and I doubt any other medical condition or other [facility] requiring further screening, evaluation, or treatment in the ED at this time prior to discharge. I have discussed lab and/or imaging findings with the patient and answered all questions/concerns to the best of my ability.I have discussed return precautions and OP follow up.  Arthur Chase was evaluated in Emergency Department on 10/06/2019 for the symptoms described in the history of present illness. He was evaluated in the context of the global COVID-19 pandemic, which necessitated consideration that the patient might be at risk for infection with the SARS-CoV-2 virus that causes COVID-19. Institutional protocols and algorithms that pertain to the evaluation of patients at risk for COVID-19 are in a state of rapid change based on information released by regulatory bodies including the CDC and federal and state organizations. These policies and  algorithms were followed during the patient's care in the ED.    Final Clinical Impression(s) / ED Diagnoses Final diagnoses:  Community acquired pneumonia of left lower lobe of lung  Atrial fibrillation with RVR [facility])    Rx / DC Orders ED Discharge Orders    None       Arthor Captain, PA-C 10/06/19 1601    Gerhard Munch, MD 10/08/19 1552

## 2019-10-06 NOTE — ED Triage Notes (Signed)
Pt BIB by EMS, reporting that the pt has had shortness of breath for the last 4 days. Pt coughing up green sputum. Pt is also presenting with afib rvr at 140-170 hr. Pt is aox4. Code sepsis activated.   97.3 Temp 142/90 150HR 30R CBG 106

## 2019-10-06 NOTE — Progress Notes (Signed)
ANTICOAGULATION CONSULT NOTE - Initial Consult  Pharmacy Consult for Warfarin Indication: atrial fibrillation  Allergies  Allergen Reactions  . Heparin     Thrombocytopenia. 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    Patient Measurements: Height: 5\' 11"  (180.3 cm) Weight: 98.4 kg (217 lb) IBW/kg (Calculated) : 75.3   Vital Signs: Temp: 98.6 F (37 C) (07/17 1331) Temp Source: Oral (07/17 1331) BP: 122/91 (07/17 1605) Pulse Rate: 66 (07/17 1605)  Labs: Recent Labs    10/06/19 1340  HGB 17.3*  HCT 51.9  PLT 225  LABPROT 23.7*  INR 2.2*  CREATININE 0.87    Estimated Creatinine Clearance: 102.5 mL/min (by C-G formula based on SCr of 0.87 mg/dL).   Medical History: Past Medical History:  Diagnosis Date  . Anoxic encephalopathy (HCC) 10/2015   a. 10/2015 in setting of cardiac arrest - recovered prior to DC.  . Cardiac arrest with ventricular fibrillation (HCC)    a. 11/2015: Unclear etiology. Nl cors on cath 8/27. S/P cooling. On amiodarone and s/p ICD for secondary prevention on 12/01/15. EF down to 25-30% but normalized to 68 by subsequent cardiac MR  . COVID-19 10/06/2019  . HIT (heparin-induced thrombocytopenia) (HCC)    a. 11/2015 during admission for VF arrest. HIT panel positive. Placed on coumadin for 2 months  . HTN (hypertension)   . Hypokalemia    a. 11/2015: resolved on K supplementation and spironolactone  . ICD (implantable cardioverter-defibrillator) in place    a. Medtronic Visia AF MRI (serial  Number [id number]) ICD.  [id number] Myocardial stunning    a. 11/2015: EF down to 20-25% after VF arrest but improved to ~68 by subsequent cardiac MR.  12/2015 NICM (nonischemic cardiomyopathy) (HCC)   . Obesity   . PAF (paroxysmal atrial fibrillation) (HCC)    a. 11/2015 brief run of afib with RVR while intubated during a complicated admission for VF arrest and VDRF. No recurrence on amio. CHADSVASC 2 (CHF, HTN) and on coumadin for HIT with positive thrombotic markers. If  no long term recurrence, he may be able to come off OAC  . Ventricular tachycardia (HCC) 10/2015    Medications:  (Not in a hospital admission)  Assessment: 64 yo male COVID positive PNA with cough and SOB. PTA warfarin 5 mg Sat and 7.5 mg rest of week for afib. Last dose of warfarin today 7/17 at 0700. INR today at goal. CBC wnl. No s/sx bleeding.   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  Goal of Therapy:  INR 2-3 Monitor platelets by anticoagulation protocol: Yes   Plan:  No warfarin dose tonight since patient already took scheduled dose this morning. Monitor INR and CBC daily.   2018, PharmD PGY-1 Pharmacy Resident 10/06/2019 4:34 PM

## 2019-10-06 NOTE — ED Notes (Addendum)
Pt given IV push Cardizen. Pt's pressures dropped into the 80's. 500cc fluid give to pt. Provider notified. New pressure is 105/75.

## 2019-10-06 NOTE — Progress Notes (Signed)
21:10: Patient admitted to 5w18, Cardizem drip running at 10/hr. Alert and oriented x 4. Skin assessed. This RN witnessed patient ambulate to the bed from hallway. He was educated and oriented to the unit, call bell, and bed alarm.   Son updated on patient's care.

## 2019-10-06 NOTE — H&P (Signed)
History and Physical    Arthur Chase WJX:[id number] DOB: 1956/01/21 DOA: 10/06/2019  PCP: Shon Hale, MD Consultants:  Ophelia Charter- orthopedics; [facility]itz[facility]Cooper - cardiology Patient coming from:  Home - lives with sons and grandson; NOK: Egon, [place], [phone]  Chief Complaint: SOB  HPI: Arthur Chase is a 64 y.o. 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  Patient reports onset of cough and SOB about 4 days ago.  He was very lethargic.  Intermittent low-grade fever.  Cough is somewhat percussive and productive of yellow-brown sputum.  Denies sick contacts or knowing anyone who has COVID.  Unvaccinated.  +anorexia, mild diarrhea.   ED Course:  Likely COVID PNA.  On 2L.  Cough, SOB x several days.  Inflammatory markers elevated, small LLL density.  In afib with RVR, started on Dilt drip.  Normal lactate.  Review of Systems: As per HPI; otherwise review of systems reviewed and negative.   Ambulatory Status:  Ambulates without assistance  COVID Vaccine Status:  None  Past Medical History:  Diagnosis Date  . Anoxic encephalopathy (HCC) 10/2015   a. 10/2015 in setting of cardiac arrest - recovered prior to DC.  . Cardiac arrest with ventricular fibrillation (HCC)    a. 11/2015: Unclear etiology. Nl cors on cath 8/27. S/P cooling. On amiodarone and s/p ICD for secondary prevention on 12/01/15. EF down to 25-30% but normalized to 68 by subsequent cardiac MR  . COVID-19 10/06/2019  . HIT (heparin-induced thrombocytopenia) (HCC)    a. 11/2015 during admission for VF arrest. HIT panel positive. Placed on coumadin for 2 months  . HTN (hypertension)   . Hypokalemia    a. 11/2015: resolved on K supplementation and spironolactone  . ICD (implantable cardioverter-defibrillator) in place    a. Medtronic Visia AF MRI (serial  Number  [id number]) ICD.  Marland Kitchen Myocardial stunning    a. 11/2015: EF down to 20-25% after VF arrest but improved to ~68 by subsequent cardiac MR.  Marland Kitchen NICM (nonischemic cardiomyopathy) (HCC)   . Obesity   . PAF (paroxysmal atrial fibrillation) (HCC)    a. 11/2015 brief run of afib with RVR while intubated during a complicated admission for VF arrest and VDRF. No recurrence on amio. CHADSVASC 2 (CHF, HTN) and on coumadin for HIT with positive thrombotic markers. If no long term recurrence, he may be able to come off OAC  . Ventricular tachycardia (HCC) 10/2015    Past Surgical History:  Procedure Laterality Date  . CARDIAC CATHETERIZATION N/A 11/16/2015   Procedure: Left Heart Cath and Coronary Angiography;  Surgeon: Peter M Swaziland, MD;  Location: [facility] INVASIVE CV LAB;  Service: Cardiovascular;  Laterality: N/A;  . EP IMPLANTABLE DEVICE N/A 12/01/2015   Procedure: ICD Implant;  Surgeon: Will [name], MD;  Location: [facility] INVASIVE CV LAB;  Service: Cardiovascular;  Laterality: N/A;    Social History   Socioeconomic History  . Marital status: Married    Spouse name: Not on file  . Number of children: Not on file  . Years of education: Not on file  . Highest education level: Not on file  Occupational History  . Not on file  Tobacco Use  . Smoking status: Former Smoker    Types: Cigarettes    Quit date: 1995    Years since quitting: 26.5  . Smokeless tobacco: Never Used  Vaping Use  .  Vaping Use: Never used  Substance and Sexual Activity  . Alcohol use: No    Comment: quit at age 47  . Drug use: No  . Sexual activity: Not on file  Other Topics Concern  . Not on file  Social History Narrative  . Not on file   Social Determinants of Health   Financial Resource Strain:   . Difficulty of Paying Living Expenses:   Food Insecurity:   . Worried About Programme researcher, broadcasting/film/video in the Last Year:   . Barista in the Last Year:   Transportation Needs:   . Freight forwarder (Medical):     Marland Kitchen Lack of Transportation (Non-Medical):   Physical Activity:   . Days of Exercise per Week:   . Minutes of Exercise per Session:   Stress:   . Feeling of Stress :   Social Connections:   . Frequency of Communication with Friends and Family:   . Frequency of Social Gatherings with Friends and Family:   . Attends Religious Services:   . Active Member of Clubs or Organizations:   . Attends Banker Meetings:   Marland Kitchen Marital Status:   Intimate Partner Violence:   . Fear of Current or Ex-Partner:   . Emotionally Abused:   Marland Kitchen Physically Abused:   . Sexually Abused:     Allergies  Allergen Reactions  . Heparin     Thrombocytopenia. 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 Heart attack Mother   . Alzheimer's disease Father     Prior to Admission medications   Medication Sig Start Date End Date Taking? Authorizing Provider  levothyroxine (SYNTHROID) 75 MCG tablet Take 75 mcg by mouth every morning. 04/25/19   [provider]  losartan (COZAAR) 25 MG tablet TAKE 1 TABLET BY MOUTH EVERY DAY 03/06/19   Tonny Bollman, MD  metoprolol succinate (TOPROL-XL) 100 MG 24 hr tablet Take 1 tablet (100 mg total) by mouth daily. 02/09/19   Camnitz, Andree Coss, MD  mexiletine (MEXITIL) 200 MG capsule TAKE 1 CAPSULE BY MOUTH TWICE A DAY 09/25/19   Camnitz, Andree Coss, MD  Multiple Vitamin (MULTIVITAMIN WITH MINERALS) TABS tablet Take 1 tablet by mouth daily. Centrum Silver    [provider]  pantoprazole (PROTONIX) 40 MG tablet TAKE 1 TABLET BY MOUTH EVERY DAY 01/30/19   Tonny Bollman, MD  potassium chloride SA (KLOR-CON M20) 20 MEQ tablet TAKE 1 TABLET BY MOUTH TWICE A DAY 03/30/19   Tonny Bollman, MD  spironolactone (ALDACTONE) 25 MG tablet TAKE 1 TABLET BY MOUTH EVERY DAY 04/26/19   Camnitz, Andree Coss, MD  warfarin (COUMADIN) 5 MG tablet Take 1 to 1.5 tablets by mouth daily as directed by the coumadin clinic  08/10/19   Regan Lemming, MD    Physical Exam: Vitals:   10/06/19 1333 10/06/19 1540 10/06/19 1545 10/06/19 1605  BP:  95/61 105/75 (!) 122/91  Pulse:  (!) 58  66  Resp:  (!) 23  16  Temp:      TempSrc:      SpO2:  97%  97%  Weight: 98.4 kg     Height: 5\' 11"  (1.803 m)        . General:  Appears mildly SOB with repetitive cough but is in NAD . Eyes:  PERRL, EOMI, normal lids, iris . ENT:  grossly normal hearing, lips & tongue, mmm; appropriate dentition .  Neck:  no LAD, masses or thyromegaly . Cardiovascular:  RRR, no m/r/g. No LE edema.  Marland Kitchen Respiratory:   Scattered rhonchi.  Mildly increased respiratory effort, on [place] O2. . Abdomen:  soft, NT, ND, NABS . Back:   normal alignment, no CVAT . Skin:  no rash or induration seen on limited exam . Musculoskeletal:  grossly normal tone BUE/BLE, good ROM, no bony abnormality . Psychiatric:  blunted mood and affect, speech fluent and appropriate, AOx3 . Neurologic:  CN 2-12 grossly intact, moves all extremities in coordinated fashion    Radiological Exams on Admission: DG Chest Port 1 View  Result Date: 10/06/2019 CLINICAL DATA:  Shortness of breath.  Cough. EXAM: PORTABLE CHEST 1 VIEW COMPARISON:  12/02/2015 FINDINGS: Stable position of the left cardiac ICD. Heart size is within normal limits. Surgical plate in the lower cervical spine. Small new densities at the left lung base. Otherwise, the lungs are clear. Negative for a pneumothorax. IMPRESSION: Small densities at the left lung base are nonspecific. Findings could represent atelectasis versus small focus of infection. Electronically Signed   By: Richarda Overlie M.D.   On: 10/06/2019 14:40    EKG: Independently reviewed.  Afib with RVR with rate 157; LAFB with nonspecific ST changes that are likely rate-related   Labs on Admission: I have personally reviewed the available labs and imaging studies at the time of the admission.  Pertinent labs:   CO2 17 Glucose 105 BNP  353.4 LDH 235 Ferritin 858 CRP 4.2 Lactate 1.6 Procalcitonin <0.10 WBC 7.9 Hgb 17.3 D-dimer 1.08 Fibrinogen 720 INR 2.2 TSH 3.762 COVID pending Blood cultures pending   Assessment/Plan Principal Problem:   Acute hypoxemic respiratory failure due to COVID-19 [facility]) Active Problems:   Atrial fibrillation with rapid ventricular response (HCC)   HIT (heparin-induced thrombocytopenia) (HCC)   HTN (hypertension)   ICD (implantable cardioverter-defibrillator) in place   Obesity   Acute respiratory failure with hypoxia -Patient with presenting with SOB and reported fever at home; also with cough productive of colored sputum -Mild anorexia and diarrhea noted without the presence of other GI symptoms -He does not have a usual home O2 requirement and is currently requiring 2L [place] O2 -COVID POSITIVE -The patient has comorbidities which may increase the risk for ARDS/MODS including: HTN -Pertinent labs concerning for COVID include normal WBC count; increased LDH; markedly elevated D-dimer (>1); increased ferritin; low procalcitonin; elevated CRP (not >7); increased fibrinogen -CXR with LLL opacity which may be c/w COVID vs. CAP -Will not treat with broad-spectrum antibiotics given procalcitonin <0.1 -Will admit for further evaluation, close monitoring, and treatment -Monitor on telemetry x at least 24 hours (going to progressive care given afib with RVR - see below) -At this time, will attempt to avoid use of aerosolized medications and use HFAs instead -Will check daily labs including BMP with Mag, Phos; LFTs; CBC with differential; CRP; ferritin; fibrinogen; D-dimer -Will order steroids and Remdesivir (pharmacy consult) given +COVID test, +CXR -If the patient shows clinical deterioration, consider transfer to ICU with PCCM consultation -Consider Tocilizumab and/or convalescent plasma if the patient does not stabilize on current treatment or if the patient has marked clinical  decompensation; the patient does not appear to require these treatments at this time. -Will attempt to maintain euvolemia to a net negative fluid status -Will ask the patient to maintain an awake prone position for 16+ hours a day, if possible, with a minimum of 2-3 hours at a time -Patient was seen wearing full PPE including:  gown, gloves, head cover, N95, and face shield; donning and doffing was in compliance with current standards.  Afib with RVR -Patient with known h/o afib -On Mexiletine, will continue  -Given 1 dose of Cardizem without significant improvement and so started on Cardizem drip -Afib is likely related to acute infection and is likely to improve with treatment of COVID -Continue Coumadin; note that he has h/o HIT and so Heparin would not be a therapeutic option in this patient  AICD in place after vtach arrest/systolic CHF in 2017 -10/2018 echo with normalization of EF -AICD is in place -Continue Mexiletine  HTN -Continue Cozaar, Toprol XL -Hold spironolactone  Hypothyroidism -Normal TSH -Continue Synthroid at current dose for now  Obesity -Body mass index is 30.27 kg/m. -Weight loss should be encouraged -Outpatient PCP/bariatric medicine f/u encouraged    DVT prophylaxis:  Coumadin  Code Status:  Full - confirmed with patient Family Communication: None present; I spoke with the patient's son by telephone. Disposition Plan:  The patient is from: home  Anticipated d/c is to: home without [facility] services once her respiratory issues have been resolved.  He may require home O2 at the time of discharge.  Anticipated d/c date will depend on clinical response to treatment, likely between 3 days (with completion of outpatient Remdesivir treatment) and 5 days  Patient is currently: acutely ill Consults called: None  Admission status: Admit - It is my clinical opinion that admission to INPATIENT is reasonable and necessary because of the expectation that this patient will  require hospital care that crosses at least 2 midnights to treat this condition based on the medical complexity of the problems presented.  Given the aforementioned information, the predictability of an adverse outcome is felt to be significant.     Jonah Blue MD [facility]   How to contact the [facility] Attending or Consulting provider 7A - 7P or covering provider during after hours 7P -7A, for this patient?  1. Check the care team in [facility] and look for a) attending/consulting [facility] provider listed and b) the [facility] team listed 2. Log into [email or web address] and use [place]'s universal password to access. If you do not have the password, please contact the hospital operator. 3. Locate the [facility] provider you are looking for under [facility] and page to a number that you can be directly reached. 4. If you still have difficulty reaching the provider, please page the [facility] (Director on Call) for the Hospitalists listed on amion for assistance.   10/06/2019, 4:23 PM

## 2019-10-06 NOTE — ED Notes (Signed)
Attempted to give report 

## 2019-10-07 LAB — CBC WITH DIFFERENTIAL/PLATELET
Abs Immature Granulocytes: 0.04 10*3/uL (ref 0.00–0.07)
Basophils Absolute: 0 10*3/uL (ref 0.0–0.1)
Basophils Relative: 0 %
Eosinophils Absolute: 0 10*3/uL (ref 0.0–0.5)
Eosinophils Relative: 0 %
HCT: 47.5 % (ref 39.0–52.0)
Hemoglobin: 15.8 g/dL (ref 13.0–17.0)
Immature Granulocytes: 1 %
Lymphocytes Relative: 27 %
Lymphs Abs: 1.7 10*3/uL (ref 0.7–4.0)
MCH: 31.3 pg (ref 26.0–34.0)
MCHC: 33.3 g/dL (ref 30.0–36.0)
MCV: 94.1 fL (ref 80.0–100.0)
Monocytes Absolute: 0.3 10*3/uL (ref 0.1–1.0)
Monocytes Relative: 5 %
Neutro Abs: 4.1 10*3/uL (ref 1.7–7.7)
Neutrophils Relative %: 67 %
Platelets: 247 10*3/uL (ref 150–400)
RBC: 5.05 MIL/uL (ref 4.22–5.81)
RDW: 13.2 % (ref 11.5–15.5)
WBC: 6.1 10*3/uL (ref 4.0–10.5)
nRBC: 0 % (ref 0.0–0.2)

## 2019-10-07 LAB — COMPREHENSIVE METABOLIC PANEL
ALT: 24 U/L (ref 0–44)
AST: 27 U/L (ref 15–41)
Albumin: 3.3 g/dL — ABNORMAL LOW (ref 3.5–5.0)
Alkaline Phosphatase: 60 U/L (ref 38–126)
Anion gap: 10 (ref 5–15)
BUN: 10 mg/dL (ref 8–23)
CO2: 23 mmol/L (ref 22–32)
Calcium: 8.9 mg/dL (ref 8.9–10.3)
Chloride: 105 mmol/L (ref 98–111)
Creatinine, Ser: 0.98 mg/dL (ref 0.61–1.24)
GFR calc Af Amer: >60 mL/min (ref >60)
GFR calc non Af Amer: >60 mL/min (ref >60)
Glucose, Bld: 144 mg/dL — ABNORMAL HIGH (ref 70–99)
Potassium: 4.7 mmol/L (ref 3.5–5.1)
Sodium: 138 mmol/L (ref 135–145)
Total Bilirubin: 0.6 mg/dL (ref 0.3–1.2)
Total Protein: 7.1 g/dL (ref 6.5–8.1)

## 2019-10-07 LAB — BRAIN NATRIURETIC PEPTIDE: B Natriuretic Peptide: 145.6 pg/mL — ABNORMAL HIGH (ref 0.0–100.0)

## 2019-10-07 LAB — C-REACTIVE PROTEIN: CRP: 4 mg/dL — ABNORMAL HIGH (ref <1.0)

## 2019-10-07 LAB — MAGNESIUM: Magnesium: 2.5 mg/dL — ABNORMAL HIGH (ref 1.7–2.4)

## 2019-10-07 LAB — FERRITIN: Ferritin: 747 ng/mL — ABNORMAL HIGH (ref 24–336)

## 2019-10-07 LAB — MRSA PCR SCREENING: MRSA by PCR: POSITIVE — AB

## 2019-10-07 LAB — D-DIMER, QUANTITATIVE: D-Dimer, Quant: 1.07 ug/mL-FEU — ABNORMAL HIGH (ref 0.00–0.50)

## 2019-10-07 LAB — PROTIME-INR
INR: 2.5 — ABNORMAL HIGH (ref 0.8–1.2)
Prothrombin Time: 26.2 seconds — ABNORMAL HIGH (ref 11.4–15.2)

## 2019-10-07 LAB — TSH: TSH: 1.851 u[IU]/mL (ref 0.350–4.500)

## 2019-10-07 LAB — PHOSPHORUS: Phosphorus: 3 mg/dL (ref 2.5–4.6)

## 2019-10-07 MED ORDER — CHLORHEXIDINE GLUCONATE CLOTH 2 % EX PADS
6.0000 | MEDICATED_PAD | Freq: Every day | CUTANEOUS | Status: DC
  Administered 2019-10-07 – 2019-10-10 (×4): 6 via TOPICAL

## 2019-10-07 MED ORDER — WARFARIN SODIUM 7.5 MG PO TABS
7.5000 mg | ORAL_TABLET | Freq: Once | ORAL | Status: AC
  Administered 2019-10-07: 7.5 mg via ORAL
  Filled 2019-10-07: qty 1

## 2019-10-07 MED ORDER — DIGOXIN 0.25 MG/ML IJ SOLN
0.2500 mg | Freq: Four times a day (QID) | INTRAMUSCULAR | Status: AC
  Administered 2019-10-07 (×2): 0.25 mg via INTRAVENOUS
  Filled 2019-10-07 (×2): qty 2

## 2019-10-07 MED ORDER — MUPIROCIN 2 % EX OINT
1.0000 "application " | TOPICAL_OINTMENT | Freq: Two times a day (BID) | CUTANEOUS | Status: DC
  Administered 2019-10-07 – 2019-10-10 (×8): 1 via NASAL
  Filled 2019-10-07 (×2): qty 22

## 2019-10-07 MED ORDER — DIGOXIN 0.25 MG/ML IJ SOLN: 0.1250 mg | Freq: Four times a day (QID) | INTRAMUSCULAR | Status: DC

## 2019-10-07 MED ORDER — DILTIAZEM HCL 25 MG/5ML IV SOLN
10.0000 mg | Freq: Four times a day (QID) | INTRAVENOUS | Status: DC | PRN
  Filled 2019-10-07: qty 5

## 2019-10-07 MED ORDER — METOPROLOL SUCCINATE ER 100 MG PO TB24
100.0000 mg | ORAL_TABLET | Freq: Every day | ORAL | Status: DC
  Administered 2019-10-07 – 2019-10-10 (×4): 100 mg via ORAL
  Filled 2019-10-07 (×4): qty 1

## 2019-10-07 NOTE — Progress Notes (Signed)
ANTICOAGULATION CONSULT NOTE - Follow-up   Pharmacy Consult for Warfarin Indication: atrial fibrillation  Allergies  Allergen Reactions  . Heparin Other (See Comments)    Thrombocytopenia. 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   Medical History: Past Medical History:  Diagnosis Date  . Anoxic encephalopathy (HCC) 10/2015   a. 10/2015 in setting of cardiac arrest - recovered prior to DC.  . Cardiac arrest with ventricular fibrillation (HCC)    a. 11/2015: Unclear etiology. Nl cors on cath 8/27. S/P cooling. On amiodarone and s/p ICD for secondary prevention on 12/01/15. EF down to 25-30% but normalized to 68 by subsequent cardiac MR  . COVID-19 10/06/2019  . HIT (heparin-induced thrombocytopenia) (HCC)    a. 11/2015 during admission for VF arrest. HIT panel positive. Placed on coumadin for 2 months  . HTN (hypertension)   . Hypokalemia    a. 11/2015: resolved on K supplementation and spironolactone  . ICD (implantable cardioverter-defibrillator) in place    a. Medtronic Visia AF MRI (serial  Number [id number]) ICD.  [id number] Myocardial stunning    a. 11/2015: EF down to 20-25% after VF arrest but improved to ~68 by subsequent cardiac MR.  12/2015 NICM (nonischemic cardiomyopathy) (HCC)   . Obesity   . PAF (paroxysmal atrial fibrillation) (HCC)    a. 11/2015 brief run of afib with RVR while intubated during a complicated admission for VF arrest and VDRF. No recurrence on amio. CHADSVASC 2 (CHF, HTN)  and on coumadin for HIT with positive thrombotic markers. If no long term recurrence, he may be able to come off OAC  . Ventricular tachycardia (HCC) 10/2015    Medications:  Medications Prior to Admission  Medication Sig Dispense Refill Last Dose  . fluticasone (FLONASE) 50 MCG/ACT nasal spray Place 1 spray into both nostrils daily.   10/06/2019 at am  . guaifenesin (ROBITUSSIN) 100 MG/5ML syrup Take 200 mg by mouth 2 (two) times daily as needed for cough or congestion.   10/05/2019 at pm  . levothyroxine (SYNTHROID) 75 MCG tablet Take 75 mcg by mouth daily before breakfast.    10/06/2019 at am  . losartan (COZAAR) 25 MG tablet TAKE 1 TABLET BY MOUTH EVERY DAY (Patient taking differently: Take 25 mg by mouth daily. ) 90 tablet 2 10/06/2019 at am  . metoprolol succinate (TOPROL-XL) 100 MG 24 hr tablet Take 1 tablet (100 mg total) by mouth daily. (Patient taking differently: Take 100 mg by mouth daily after supper. ) 90 tablet 3 10/04/2019 at 1900  . mexiletine (MEXITIL) 200 MG capsule TAKE 1 CAPSULE BY MOUTH TWICE A DAY (Patient taking differently: Take 200 mg by mouth 2 (two) times daily. ) 180 capsule 2 10/06/2019 at am  . Multiple Vitamin (MULTIVITAMIN WITH MINERALS) TABS tablet Take 1 tablet by mouth daily. Centrum Silver   10/06/2019 at am  . pantoprazole (PROTONIX) 40 MG tablet TAKE 1 TABLET BY MOUTH EVERY DAY (Patient taking differently:  Take 40 mg by mouth daily. ) 90 tablet 3 10/06/2019 at am  . potassium chloride SA (KLOR-CON M20) 20 MEQ tablet TAKE 1 TABLET BY MOUTH TWICE A DAY (Patient taking differently: Take 20 mEq by mouth 2 (two) times daily. ) 180 tablet 2 10/06/2019 at am  . spironolactone (ALDACTONE) 25 MG tablet TAKE 1 TABLET BY MOUTH EVERY DAY (Patient taking differently: Take 25 mg by mouth daily. ) 90 tablet 2 10/06/2019 at am  . warfarin (COUMADIN) 5 MG tablet Take 1 to 1.5 tablets by mouth daily as directed by the coumadin clinic (Patient taking differently: Take 5-7.5 mg by mouth See  admin instructions. Take 1 tablet (5 mg) by mouth on Saturday mornings, take 1 1/2 tablets (7.5 mg) on all other mornings of the week - or as directed by the coumadin clinic) 45 tablet 2 10/06/2019 at 7am   Assessment: 64 yo male COVID positive PNA with cough and SOB. Prior to admission warfarin 5 mg Sat and 7.5 mg rest of week for afib. Last dose of warfarin was on 7/17 at 0700 and INR on 7/17 was therapeutic at 2.2. Today's INR on 7/18 is therapeutic at 2.5. No signs and symptoms of bleeding and CBC is stable.  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  Goal of Therapy:  INR 2-3 Monitor platelets by anticoagulation protocol: Yes   Plan:  Take 7.5mg  of warfarin today, per home dose, Monitor INR and CBC daily.  Monitor diet and intake. Monitor for signs and symptoms of bleeding.   Isaias Sakai, PharmD, MBA Pharmacy Resident [phone] 10/07/2019 7:52 AM

## 2019-10-07 NOTE — Progress Notes (Signed)
Patient medications sent to the pharmacy.

## 2019-10-07 NOTE — Progress Notes (Signed)
PROGRESS NOTE                                                                                                                                                                                                             Patient Demographics:    Arthur Chase, is a 64 y.o. male, DOB - [birth year], RFX:[id number]  Outpatient Primary MD for the patient is Glenis Smoker, MD    LOS - 1  Admit date - 10/06/2019    Chief Complaint  Patient presents with  . Shortness of Breath  . Cough       Brief Narrative - Arthur Chase is a 64 y.o. 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  Patient reports onset of cough and SOB about 4 days ago, the ER work-up was suggestive of acute hypoxic respiratory failure due to COVID-19 pneumonia and he was admitted.  Soon after coming to the floor he developed paroxysmal A. fib RVR.Marland Kitchen   Subjective:    Arthur Chase today has, No headache, No chest pain, No abdominal pain - No Nausea, No new weakness tingling or numbness, improved SOB.   Assessment  & Plan :     1. Acute Hypoxic Resp. Failure due to Acute Covid 19 Viral Pneumonitis during the ongoing 2020 Covid 19 Pandemic - he has moderate disease so far, unfortunately he is not vaccinated, has been started on steroids and remdesivir.  Will monitor closely.  He has consented for Actemra use, if any clinical worsening Actemra will be given.  Encouraged the patient to sit up in chair in the daytime use I-S and flutter valve for pulmonary toiletry and then prone in bed when at night.  Will advance activity and titrate down oxygen as possible.  Actemra off label use - patient was told that if COVID-19 pneumonitis gets worse we might potentially use Actemra off label, patient denies any known history of active  diverticulitis, tuberculosis or hepatitis, understands the risks and benefits and wants to proceed with Actemra treatment if required.     SpO2: 97 % O2 Flow Rate (L/min): 2 L/min  Recent Labs  Lab 10/06/19 1331 10/06/19 1340 10/06/19 1403 10/07/19 0209  CRP  --  4.2*  --  4.0*  DDIMER  --  1.08*  --  1.07*  BNP 353.4*  --   --  145.6*  PROCALCITON  --  <0.10  --   --   SARSCOV2NAA  --   --  POSITIVE*  --     Hepatic Function Latest Ref Rng & Units 10/07/2019 10/06/2019 10/20/2017  Total Protein 6.5 - 8.1 g/dL 7.1 7.3 7.1  Albumin 3.5 - 5.0 g/dL 3.3(L) 3.7 4.7  AST 15 - 41 U/L 27 36 19  ALT 0 - 44 U/L '24 25 19  ' Alk Phosphatase 38 - 126 U/L 60 70 66  Total Bilirubin 0.3 - 1.2 mg/dL 0.6 0.8 0.3  Bilirubin, Direct 0.00 - 0.40 mg/dL - - 0.09   Lab Results  Component Value Date   TSH 1.851 10/07/2019    2.  Paroxysmal A. fib RVR [place] vas 2 score of at least 2.  Currently on diltiazem drip, will continue home dose oral beta-blocker and try to get diltiazem off, if needed low-dose digoxin will be added along with as needed IV diltiazem boluses.  Continue Coumadin.  Follow-up with Dr. Curt Bears his EP post discharge.  Stable TSH, repeat echocardiogram.  3.  History of V. tach.  Has AICD.  Monitor electrolytes.  4.  Nonischemic cardiomyopathy in the past.  Latest echo shows a EF of 55 to 60%.  Continue beta-blocker, ARB combination and monitor.  5.  Hypertension.  Continue beta-blocker and ARB combination.  6.  Hypothyroidism.  On Synthroid.      Condition - Extremely Guarded  Family Communication  :   Arthur Chase [phone] on 10/07/2019  Code Status :  Full  Consults  :  None  Procedures  :    Echo  PUD Prophylaxis : PPI  Disposition Plan  :    Status is: Inpatient  Remains inpatient appropriate because:IV treatments appropriate due to intensity of illness or inability to take PO   Dispo: The patient is from: Home              Anticipated d/c is to: Home               Anticipated d/c date is: > 3 days              Patient currently is not medically stable to d/c.   DVT Prophylaxis  :  Co[place]umdin  Lab Results  Component Value Date   INR 2.5 (H) 10/07/2019   INR 2.2 (H) 10/06/2019   INR 1.8 (A) 09/27/2019    Lab Results  Component Value Date   PLT 247 10/07/2019    Diet :  Diet Order            Diet Heart Room service appropriate? Yes; Fluid consistency: Thin  Diet effective now                  Inpatient Medications  Scheduled Meds: . Chlorhexidine Gluconate Cloth  6 each Topical Q0600  . dexamethasone (DECADRON) injection  6 mg Intravenous Q24H  . levothyroxine  75 mcg Oral Q0600  . losartan  25 mg Oral Daily  . metoprolol succinate  100 mg Oral Daily  . mexiletine  200 mg Oral BID  . mupirocin ointment  1 application Nasal BID  . pantoprazole  40 mg Oral Daily  . warfarin  7.5 mg Oral ONCE-1600  . Warfarin - Pharmacist Dosing Inpatient   Does not apply q1600   Continuous Infusions: . diltiazem (CARDIZEM) infusion 10  mg/hr (10/07/19 0416)  . remdesivir 100 mg in NS 100 mL 100 mg (10/07/19 0835)   PRN Meds:.acetaminophen, albuterol, bisacodyl, chlorpheniramine-HYDROcodone, diltiazem, guaiFENesin-dextromethorphan, [DISCONTINUED] ondansetron **OR** ondansetron (ZOFRAN) IV, polyethylene glycol, sodium phosphate  Antibiotics  :    Anti-infectives (From admission, onward)   Start     Dose/Rate Route Frequency Ordered Stop   10/07/19 1000  remdesivir 100 mg in sodium chloride 0.9 % 100 mL IVPB     Discontinue    "Followed by" Linked Group Details   100 mg 200 mL/hr over 30 Minutes Intravenous Daily 10/06/19 1618 10/11/19 0959   10/06/19 1630  remdesivir 200 mg in sodium chloride 0.9% 250 mL IVPB       "Followed by" Linked Group Details   200 mg 580 mL/hr over 30 Minutes Intravenous Once 10/06/19 1618 10/06/19 1815       Time Spent in minutes  30   Lala Lund M.D on 10/07/2019 at 9:22 AM  To page go to  [email or web address] - password [email or web address]  [facility] -  Office  [phone]    See all Orders from today for further details    Objective:   Vitals:   10/06/19 2010 10/06/19 2113 10/06/19 2115 10/06/19 2315  BP: 99/69  97/82 111/82  Pulse: 94  79 69  Resp: '19  18 18  ' Temp:  98.1 F (36.7 C) 98.1 F (36.7 C) 97.8 F (36.6 C)  TempSrc:  Oral Oral   SpO2: 96%  96% 97%  Weight:      Height:        Wt Readings from Last 3 Encounters:  10/06/19 98.4 kg  05/21/19 102.8 kg  11/17/18 98.4 kg     Intake/Output Summary (Last 24 hours) at 10/07/2019 7262 Last data filed at 10/07/2019 0300 Gross per 24 hour  Intake 612.13 ml  Output 1 ml  Net 611.13 ml     Physical Exam  Awake Alert, No new F.N deficits, Normal affect [place].AT,PERRAL Supple Neck,No JVD, No cervical lymphadenopathy appriciated.  Symmetrical Chest wall movement, Good air movement bilaterally, CTAB iRRR,No Gallops,Rubs or new Murmurs, No Parasternal Heave +ve B.Sounds, Abd Soft, No tenderness, No organomegaly appriciated, No rebound - guarding or rigidity. No Cyanosis, Clubbing or edema, No new Rash or bruise       Data Review:    CBC Recent Labs  Lab 10/06/19 1340 10/07/19 0209  WBC 7.9 6.1  HGB 17.3* 15.8  HCT 51.9 47.5  PLT 225 247  MCV 93.0 94.1  MCH 31.0 31.3  MCHC 33.3 33.3  RDW 12.9 13.2  LYMPHSABS 2.4 1.7  MONOABS 0.5 0.3  EOSABS 0.0 0.0  BASOSABS 0.0 0.0    Chemistries  Recent Labs  Lab 10/06/19 1340 10/07/19 0209  NA 134* 138  K 4.4 4.7  CL 103 105  CO2 17* 23  GLUCOSE 105* 144*  BUN 10 10  CREATININE 0.87 0.98  CALCIUM 8.7* 8.9  AST 36 27  ALT 25 24  ALKPHOS 70 60  BILITOT 0.8 0.6  MG 2.3 2.5*  INR 2.2* 2.5*  TSH 3.762 1.851     ------------------------------------------------------------------------------------------------------------------ Recent Labs    10/06/19 1340  TRIG 136    Lab Results  Component Value Date   HGBA1C 5.4 11/26/2015    ------------------------------------------------------------------------------------------------------------------ Recent Labs    10/07/19 0209  TSH 1.851    Cardiac Enzymes No results for input(s): CKMB, TROPONINI, MYOGLOBIN in the last 168 hours.  Invalid input(s): CK ------------------------------------------------------------------------------------------------------------------    Component Value  Date/Time   BNP 145.6 (H) 10/07/2019 0209    Micro Results Recent Results (from the past 240 hour(s))  SARS Coronavirus 2 by RT PCR (hospital order, performed in [facility] hospital lab) Nasopharyngeal Nasopharyngeal Swab     Status: Abnormal   Collection Time: 10/06/19  2:03 PM   Specimen: Nasopharyngeal Swab  Result Value Ref Range Status   SARS Coronavirus 2 POSITIVE (A) NEGATIVE Final    Comment: RESULT CALLED TO, READ BACK BY AND VERIFIED WITH: RN A COSTALES [id number] 2010 MLM (NOTE) SARS-CoV-2 target nucleic acids are DETECTED  SARS-CoV-2 RNA is generally detectable in upper respiratory specimens  during the acute phase of infection.  Positive results are indicative  of the presence of the identified virus, but do not rule out bacterial infection or co-infection with other pathogens not detected by the test.  Clinical correlation with patient history and  other diagnostic information is necessary to determine patient infection status.  The expected result is negative.  Fact Sheet for Patients:   StrictlyIdeas.no   Fact Sheet for Healthcare Providers:   BankingDealers.co.za    This test is not yet approved or cleared by the [place] FDA and  has been authorized for detection and/or diagnosis of SARS-CoV-2 by FDA under an Emergency Use Authorization (EUA).  This EUA will remain in effect (meaning this test ca n be used) for the duration of  the COVID-19 declaration under Section 564(b)(1) of the Act, 21 U.S.C.  section 360-bbb-3(b)(1), unless the authorization is terminated or revoked sooner.  Performed at [facility], [place] [address] [place], [place] [address]   MRSA PCR Screening     Status: Abnormal   Collection Time: 10/06/19 11:34 PM   Specimen: Nasal Mucosa; Nasopharyngeal  Result Value Ref Range Status   MRSA by PCR POSITIVE (A) NEGATIVE Final    Comment:        The GeneXpert MRSA Assay (FDA approved for NASAL specimens only), is one component of a comprehensive MRSA colonization surveillance program. It is not intended to diagnose MRSA infection nor to guide or monitor treatment for MRSA infections. RESULT CALLED TO, READ BACK BY AND VERIFIED WITH: GAMBLE,B RN 0103 10/07/2019 MITCHELL,L Performed at [facility], [place] [address], [place], [place] [address]     Radiology Reports DG Chest [place] 1 View  Result Date: 10/06/2019 CLINICAL DATA:  Shortness of breath.  Cough. EXAM: PORTABLE CHEST 1 VIEW COMPARISON:  12/02/2015 FINDINGS: Stable position of the left cardiac ICD. Heart size is within normal limits. Surgical plate in the lower cervical spine. Small new densities at the left lung base. Otherwise, the lungs are clear. Negative for a pneumothorax. IMPRESSION: Small densities at the left lung base are nonspecific. Findings could represent atelectasis versus small focus of infection. Electronically Signed   By: Markus Daft M.D.   On: 10/06/2019 14:40   XR KNEE 3 VIEW RIGHT  Result Date: 10/03/2019 AP lateral bilateral sunrise patellar x-rays obtained right knee.  This shows slight narrowing left knee medial joint line.  Left knee shows no significant narrowing no degenerative or acute changes. Impression: Normal right knee radiographs.  Slight left knee medial joint line narrowing.

## 2019-10-07 NOTE — Evaluation (Signed)
Physical Therapy Evaluation Patient Details Name: Arthur Chase MRN: [id number] DOB: 25-Nov-1955 Today's Date: 10/07/2019   History of Present Illness  Arthur Chase is a 64 y.o. 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  Patient reports onset of cough and SOB about 4 days ago, the ER work-up was suggestive of acute hypoxic respiratory failure due to COVID-19 pneumonia and he was admitted.  Soon after coming to the floor he developed paroxysmal A. fib RVR.Marland Kitchen  Clinical Impression   Pt admitted with above diagnosis. Comes from home where he lives with son and grandson, in a single level home with a few stesp to enter; Independent at baseline; Presents to PT overall moving well, but with decr activity tolerance, decr functional capacity, low O2 saturations with activity; still, the good news is he ambulated well on Room Air;  Pt currently with functional limitations due to the deficits listed below (see PT Problem List). Pt will benefit from skilled PT to increase their independence and safety with mobility to allow discharge to the venue listed below.       Follow Up Recommendations No PT follow up    Equipment Recommendations  None recommended by PT    Recommendations for Other Services       Precautions / Restrictions Precautions Precautions: Other (comment) Precaution Comments: monitor O2 sats      Mobility  Bed Mobility                  Transfers Overall transfer level: Independent                  Ambulation/Gait Ambulation/Gait assistance: Supervision Gait Distance (Feet): 180 Feet (pt prefered to do laps in room) Assistive device: None Gait Pattern/deviations: Step-through pattern     General Gait Details: Cues to self-monitor for activity tolerance  Stairs            Wheelchair Mobility    Modified  Rankin (Stroke Patients Only)       Balance Overall balance assessment: No apparent balance deficits (not formally assessed)                                           Pertinent Vitals/Pain Pain Assessment: No/denies pain    Home Living Family/patient expects to be discharged to:: Private residence Living Arrangements: Children (son and grandson)   Type of Home: House Home Access: Stairs to enter Entrance Stairs-Rails: None Secretary/administrator of Steps: 3 Home Layout: One level        Prior Function Level of Independence: Independent               Hand Dominance        Extremity/Trunk Assessment   Upper Extremity Assessment Upper Extremity Assessment: Overall WFL for tasks assessed    Lower Extremity Assessment Lower Extremity Assessment: Generalized weakness    Cervical / Trunk Assessment Cervical / Trunk Assessment: Normal  Communication   Communication: No difficulties  Cognition Arousal/Alertness: Awake/alert Behavior During Therapy: WFL for tasks assessed/performed Overall Cognitive Status: Within Functional Limits for tasks assessed  General Comments General comments (skin integrity, edema, etc.): session conducted on Room air; Lowest O2 sat observed was 85%, recovered quickly, without the need to start supplememtal O2; BPs on the low side, but stable: supine 106/91, sitting 98/77, standing 101/80, sitting post amb 104/74; HR 72 to 84 bpm     Exercises     Assessment/Plan    PT Assessment Patient needs continued PT services  PT Problem List Decreased activity tolerance;Cardiopulmonary status limiting activity       PT Treatment Interventions DME instruction;Gait training;Stair training;Functional mobility training;Therapeutic activities;Therapeutic exercise;Patient/family education    PT Goals (Current goals can be found in the Care Plan section)  Acute Rehab PT  Goals Patient Stated Goal: HOpes to ber home soon PT Goal Formulation: With patient Time For Goal Achievement: 10/21/19 Potential to Achieve Goals: Good    Frequency Min 3X/week   Barriers to discharge        Co-evaluation               AM-PAC PT "6 Clicks" Mobility  Outcome Measure Help needed turning from your back to your side while in a flat bed without using bedrails?: None Help needed moving from lying on your back to sitting on the side of a flat bed without using bedrails?: None Help needed moving to and from a bed to a chair (including a wheelchair)?: None Help needed standing up from a chair using your arms (e.g., wheelchair or bedside chair)?: None Help needed to walk in hospital room?: None Help needed climbing 3-5 steps with a railing? : A Little 6 Click Score: 23    End of Session   Activity Tolerance: Patient tolerated treatment well Patient left: in chair;with call bell/phone within reach Nurse Communication: Mobility status PT Visit Diagnosis: Other abnormalities of gait and mobility (R26.89)    Time: 1445-1511 PT Time Calculation (min) (ACUTE ONLY): 26 min   Charges:   PT Evaluation $PT Eval Low Complexity: 1 Low PT Treatments $Gait Training: 8-22 mins        Van Clines, PT  Acute Rehabilitation Services Pager [phone] Office [phone]   Levi Aland 10/07/2019, 6:41 PM

## 2019-10-08 ENCOUNTER — Inpatient Hospital Stay (HOSPITAL_COMMUNITY): Payer: No Typology Code available for payment source

## 2019-10-08 DIAGNOSIS — I5031 Acute diastolic (congestive) heart failure: Secondary | ICD-10-CM

## 2019-10-08 LAB — COMPREHENSIVE METABOLIC PANEL
ALT: 25 U/L (ref 0–44)
AST: 25 U/L (ref 15–41)
Albumin: 3.3 g/dL — ABNORMAL LOW (ref 3.5–5.0)
Alkaline Phosphatase: 65 U/L (ref 38–126)
Anion gap: 8 (ref 5–15)
BUN: 17 mg/dL (ref 8–23)
CO2: 21 mmol/L — ABNORMAL LOW (ref 22–32)
Calcium: 9.1 mg/dL (ref 8.9–10.3)
Chloride: 108 mmol/L (ref 98–111)
Creatinine, Ser: 0.8 mg/dL (ref 0.61–1.24)
GFR calc Af Amer: >60 mL/min (ref >60)
GFR calc non Af Amer: >60 mL/min (ref >60)
Glucose, Bld: 143 mg/dL — ABNORMAL HIGH (ref 70–99)
Potassium: 4.4 mmol/L (ref 3.5–5.1)
Sodium: 137 mmol/L (ref 135–145)
Total Bilirubin: 0.8 mg/dL (ref 0.3–1.2)
Total Protein: 7 g/dL (ref 6.5–8.1)

## 2019-10-08 LAB — C-REACTIVE PROTEIN: CRP: 1.1 mg/dL — ABNORMAL HIGH (ref <1.0)

## 2019-10-08 LAB — ECHOCARDIOGRAM COMPLETE
AR max vel: 2.81 cm2
AV Area VTI: 2.53 cm2
AV Area mean vel: 2.18 cm2
AV Mean grad: 3 mmHg
AV Peak grad: 4.2 mmHg
Ao pk vel: 1.03 m/s
Height: 71 in
Weight: 3472 oz

## 2019-10-08 LAB — D-DIMER, QUANTITATIVE: D-Dimer, Quant: 0.71 ug/mL-FEU — ABNORMAL HIGH (ref 0.00–0.50)

## 2019-10-08 LAB — CBC WITH DIFFERENTIAL/PLATELET
Abs Immature Granulocytes: 0.07 10*3/uL (ref 0.00–0.07)
Basophils Absolute: 0 10*3/uL (ref 0.0–0.1)
Basophils Relative: 0 %
Eosinophils Absolute: 0 10*3/uL (ref 0.0–0.5)
Eosinophils Relative: 0 %
HCT: 45.8 % (ref 39.0–52.0)
Hemoglobin: 15.3 g/dL (ref 13.0–17.0)
Immature Granulocytes: 1 %
Lymphocytes Relative: 16 %
Lymphs Abs: 1.8 10*3/uL (ref 0.7–4.0)
MCH: 31.1 pg (ref 26.0–34.0)
MCHC: 33.4 g/dL (ref 30.0–36.0)
MCV: 93.1 fL (ref 80.0–100.0)
Monocytes Absolute: 0.5 10*3/uL (ref 0.1–1.0)
Monocytes Relative: 4 %
Neutro Abs: 8.6 10*3/uL — ABNORMAL HIGH (ref 1.7–7.7)
Neutrophils Relative %: 79 %
Platelets: 276 10*3/uL (ref 150–400)
RBC: 4.92 MIL/uL (ref 4.22–5.81)
RDW: 13 % (ref 11.5–15.5)
WBC: 10.9 10*3/uL — ABNORMAL HIGH (ref 4.0–10.5)
nRBC: 0 % (ref 0.0–0.2)

## 2019-10-08 LAB — MAGNESIUM: Magnesium: 2.3 mg/dL (ref 1.7–2.4)

## 2019-10-08 LAB — PROTIME-INR
INR: 2.8 — ABNORMAL HIGH (ref 0.8–1.2)
Prothrombin Time: 28.9 seconds — ABNORMAL HIGH (ref 11.4–15.2)

## 2019-10-08 LAB — BRAIN NATRIURETIC PEPTIDE: B Natriuretic Peptide: 101.3 pg/mL — ABNORMAL HIGH (ref 0.0–100.0)

## 2019-10-08 MED ORDER — WARFARIN SODIUM 7.5 MG PO TABS
7.5000 mg | ORAL_TABLET | Freq: Once | ORAL | Status: AC
  Administered 2019-10-08: 7.5 mg via ORAL
  Filled 2019-10-08: qty 1

## 2019-10-08 NOTE — Progress Notes (Signed)
PROGRESS NOTE                                                                                                                                                                                                             Patient Demographics:    Arthur Chase, is a 64 y.o. male, DOB - Jun 20, 1955, VZD:[id number]  Outpatient Primary MD for the patient is Glenis Smoker, MD    LOS - 2  Admit date - 10/06/2019    Chief Complaint  Patient presents with  . Shortness of Breath  . Cough       Brief Narrative - Arthur Chase is a 64 y.o. 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  Patient reports onset of cough and SOB about 4 days ago, the ER work-up was suggestive of acute hypoxic respiratory failure due to COVID-19 pneumonia and he was admitted.  Soon after coming to the floor he developed paroxysmal A. fib RVR.Marland Kitchen   Subjective:   Patient in bed, appears comfortable, denies any headache, no fever, no chest pain or pressure, no shortness of breath , no abdominal pain. No focal weakness.   Assessment  & Plan :     1. Acute Hypoxic Resp. Failure due to Acute Covid 19 Viral Pneumonitis during the ongoing 2020 Covid 19 Pandemic - he has moderate disease so far, unfortunately he is not vaccinated, he has been treated with steroids and remdesivir with clinical stabilization, pulmonary status has stabilized.  Encouraged the patient to sit up in chair in the daytime use I-S and flutter valve for pulmonary toiletry and then prone in bed when at night.  Will advance activity and titrate down oxygen as possible.     SpO2: 92 % O2 Flow Rate (L/min): 1 L/min  Recent Labs  Lab 10/06/19 1331 10/06/19 1340 10/06/19 1403 10/07/19 0209 10/08/19 0303  CRP  --  4.2*  --  4.0* 1.1*  DDIMER  --   1.08*  --  1.07* 0.71*  BNP 353.4*  --   --  145.6* 101.3*  PROCALCITON  --  <0.10  --   --   --   SARSCOV2NAA  --   --  POSITIVE*  --   --  Hepatic Function Latest Ref Rng & Units 10/08/2019 10/07/2019 10/06/2019  Total Protein 6.5 - 8.1 g/dL 7.0 7.1 7.3  Albumin 3.5 - 5.0 g/dL 3.3(L) 3.3(L) 3.7  AST 15 - 41 U/L 25 27 36  ALT 0 - 44 U/L '25 24 25  ' Alk Phosphatase 38 - 126 U/L 65 60 70  Total Bilirubin 0.3 - 1.2 mg/dL 0.8 0.6 0.8  Bilirubin, Direct 0.00 - 0.40 mg/dL - - -   Lab Results  Component Value Date   TSH 1.851 10/07/2019    2.  Paroxysmal A. fib RVR [place] vas 2 score of at least 2.  He was initially on IV Cardizem drip, also received 2 doses of IV digoxin, now transition to high-dose Toprol-XL with stabilization and good rate control, continue Coumadin.  Stable TSH, repeat echocardiogram pending.  3.  History of V. tach.  Has AICD.  Monitor electrolytes.  4.  Nonischemic cardiomyopathy in the past.  Latest echo shows a EF of 55 to 60%.  Continue beta-blocker, ARB combination and monitor.  5.  Hypertension.  Continue beta-blocker and ARB combination.  6.  Hypothyroidism.  On Synthroid.      Condition - Extremely Guarded  Family Communication  :   Lucille Passy [phone] on 10/07/2019  Code Status :  Full  Consults  :  None  Procedures  :    Echo  PUD Prophylaxis : PPI  Disposition Plan  :    Status is: Inpatient  Remains inpatient appropriate because:IV treatments appropriate due to intensity of illness or inability to take PO   Dispo: The patient is from: Home              Anticipated d/c is to: Home              Anticipated d/c date is: > 3 days              Patient currently is not medically stable to d/c.   DVT Prophylaxis  :  Coaumdin  Lab Results  Component Value Date   INR 2.8 (H) 10/08/2019   INR 2.5 (H) 10/07/2019   INR 2.2 (H) 10/06/2019    Lab Results  Component Value Date   PLT 276 10/08/2019    Diet :  Diet Order              Diet Heart Room service appropriate? Yes; Fluid consistency: Thin  Diet effective now                  Inpatient Medications  Scheduled Meds: . Chlorhexidine Gluconate Cloth  6 each Topical Q0600  . dexamethasone (DECADRON) injection  6 mg Intravenous Q24H  . levothyroxine  75 mcg Oral Q0600  . losartan  25 mg Oral Daily  . metoprolol succinate  100 mg Oral Daily  . mexiletine  200 mg Oral BID  . mupirocin ointment  1 application Nasal BID  . pantoprazole  40 mg Oral Daily  . Warfarin - Pharmacist Dosing Inpatient   Does not apply q1600   Continuous Infusions: . remdesivir 100 mg in NS 100 mL Stopped (10/07/19 1900)   PRN Meds:.acetaminophen, albuterol, bisacodyl, chlorpheniramine-HYDROcodone, diltiazem, guaiFENesin-dextromethorphan, [DISCONTINUED] ondansetron **OR** ondansetron (ZOFRAN) IV, polyethylene glycol, sodium phosphate  Antibiotics  :    Anti-infectives (From admission, onward)   Start     Dose/Rate Route Frequency Ordered Stop   10/07/19 1000  remdesivir 100 mg in sodium chloride 0.9 % 100 mL IVPB     Discontinue    "  Followed by" Linked Group Details   100 mg 200 mL/hr over 30 Minutes Intravenous Daily 10/06/19 1618 10/11/19 0959   10/06/19 1630  remdesivir 200 mg in sodium chloride 0.9% 250 mL IVPB       "Followed by" Linked Group Details   200 mg 580 mL/hr over 30 Minutes Intravenous Once 10/06/19 1618 10/06/19 1815       Time Spent in minutes  30   Lala Lund M.D on 10/08/2019 at 8:21 AM  To page go to [email or web address] - password [facility]  [facility] -  Office  [phone]    See all Orders from today for further details    Objective:   Vitals:   10/07/19 2130 10/07/19 2348 10/08/19 0406 10/08/19 0809  BP:  107/72 103/79 97/70  Pulse: 66 78 70 82  Resp: '18 20 16 ' (!) 21  Temp:  97.6 F (36.4 C) (!) 97.5 F (36.4 C) 97.8 F (36.6 C)  TempSrc:  Oral Oral Oral  SpO2: 92% 94% 91% 92%  Weight:      Height:        Wt Readings from  Last 3 Encounters:  10/06/19 98.4 kg  05/21/19 102.8 kg  11/17/18 98.4 kg     Intake/Output Summary (Last 24 hours) at 10/08/2019 0821 Last data filed at 10/08/2019 0550 Gross per 24 hour  Intake 760 ml  Output 1 ml  Net 759 ml     Physical Exam  Awake Alert, No new F.N deficits, Normal affect [place].AT,PERRAL Supple Neck,No JVD, No cervical lymphadenopathy appriciated.  Symmetrical Chest wall movement, Good air movement bilaterally, CTAB RRR,No Gallops, Rubs or new Murmurs, No Parasternal Heave +ve B.Sounds, Abd Soft, No tenderness, No organomegaly appriciated, No rebound - guarding or rigidity. No Cyanosis, Clubbing or edema, No new Rash or bruise    Data Review:    CBC Recent Labs  Lab 10/06/19 1340 10/07/19 0209 10/08/19 0303  WBC 7.9 6.1 10.9*  HGB 17.3* 15.8 15.3  HCT 51.9 47.5 45.8  PLT 225 247 276  MCV 93.0 94.1 93.1  MCH 31.0 31.3 31.1  MCHC 33.3 33.3 33.4  RDW 12.9 13.2 13.0  LYMPHSABS 2.4 1.7 1.8  MONOABS 0.5 0.3 0.5  EOSABS 0.0 0.0 0.0  BASOSABS 0.0 0.0 0.0    Chemistries  Recent Labs  Lab 10/06/19 1340 10/07/19 0209 10/08/19 0303  NA 134* 138 137  K 4.4 4.7 4.4  CL 103 105 108  CO2 17* 23 21*  GLUCOSE 105* 144* 143*  BUN '10 10 17  ' CREATININE 0.87 0.98 0.80  CALCIUM 8.7* 8.9 9.1  AST 36 27 25  ALT '25 24 25  ' ALKPHOS 70 60 65  BILITOT 0.8 0.6 0.8  MG 2.3 2.5* 2.3  INR 2.2* 2.5* 2.8*  TSH 3.762 1.851  --      ------------------------------------------------------------------------------------------------------------------ Recent Labs    10/06/19 1340  TRIG 136    Lab Results  Component Value Date   HGBA1C 5.4 11/26/2015   ------------------------------------------------------------------------------------------------------------------ Recent Labs    10/07/19 0209  TSH 1.851    Cardiac Enzymes No results for input(s): CKMB, TROPONINI, MYOGLOBIN in the last 168 hours.  Invalid input(s):  CK ------------------------------------------------------------------------------------------------------------------    Component Value Date/Time   BNP 101.3 (H) 10/08/2019 0303    Micro Results Recent Results (from the past 240 hour(s))  Blood Culture (routine x 2)     Status: None (Preliminary result)   Collection Time: 10/06/19  1:40 PM   Specimen: BLOOD  Result Value  Ref Range Status   Specimen Description BLOOD RIGHT ANTECUBITAL  Final   Special Requests   Final    BOTTLES DRAWN AEROBIC AND ANAEROBIC Blood Culture adequate volume   Culture   Final    NO GROWTH < 24 HOURS Performed at [facility], [address], [place], [place] [address]    Report Status PENDING  Incomplete  SARS Coronavirus 2 by RT PCR (hospital order, performed in [facility] hospital lab) Nasopharyngeal Nasopharyngeal Swab     Status: Abnormal   Collection Time: 10/06/19  2:03 PM   Specimen: Nasopharyngeal Swab  Result Value Ref Range Status   SARS Coronavirus 2 POSITIVE (A) NEGATIVE Final    Comment: RESULT CALLED TO, READ BACK BY AND VERIFIED WITH: RN A COSTALES [id number] MLM (NOTE) SARS-CoV-2 target nucleic acids are DETECTED  SARS-CoV-2 RNA is generally detectable in upper respiratory specimens  during the acute phase of infection.  Positive results are indicative  of the presence of the identified virus, but do not rule out bacterial infection or co-infection with other pathogens not detected by the test.  Clinical correlation with patient history and  other diagnostic information is necessary to determine patient infection status.  The expected result is negative.  Fact Sheet for Patients:   StrictlyIdeas.no   Fact Sheet for Healthcare Providers:   BankingDealers.co.za    This test is not yet approved or cleared by the [place] FDA and  has been authorized for detection and/or diagnosis of SARS-CoV-2 by FDA under an Emergency  Use Authorization (EUA).  This EUA will remain in effect (meaning this test ca n be used) for the duration of  the COVID-19 declaration under Section 564(b)(1) of the Act, 21 U.S.C. section 360-bbb-3(b)(1), unless the authorization is terminated or revoked sooner.  Performed at [facility], [place] [address], [place], [place] [address]   Blood Culture (routine x 2)     Status: None (Preliminary result)   Collection Time: 10/06/19  3:24 PM   Specimen: BLOOD LEFT FOREARM  Result Value Ref Range Status   Specimen Description BLOOD LEFT FOREARM  Final   Special Requests   Final    BOTTLES DRAWN AEROBIC AND ANAEROBIC Blood Culture adequate volume   Culture   Final    NO GROWTH < 24 HOURS Performed at [facility], [place] [address] [place], [place] [address]    Report Status PENDING  Incomplete  MRSA PCR Screening     Status: Abnormal   Collection Time: 10/06/19 11:34 PM   Specimen: Nasal Mucosa; Nasopharyngeal  Result Value Ref Range Status   MRSA by PCR POSITIVE (A) NEGATIVE Final    Comment:        The GeneXpert MRSA Assay (FDA approved for NASAL specimens only), is one component of a comprehensive MRSA colonization surveillance program. It is not intended to diagnose MRSA infection nor to guide or monitor treatment for MRSA infections. RESULT CALLED TO, READ BACK BY AND VERIFIED WITH: GAMBLE,B RN 0103 10/07/2019 MITCHELL,L Performed at [facility], [place] [address], [place], [place] [address]     Radiology Reports DG Chest [place] 1 View  Result Date: 10/06/2019 CLINICAL DATA:  Shortness of breath.  Cough. EXAM: PORTABLE CHEST 1 VIEW COMPARISON:  12/02/2015 FINDINGS: Stable position of the left cardiac ICD. Heart size is within normal limits. Surgical plate in the lower cervical spine. Small new densities at the left lung base. Otherwise, the lungs are clear. Negative  for a pneumothorax. IMPRESSION: Small densities at the left lung base are nonspecific. Findings  could represent atelectasis versus small focus of infection. Electronically Signed   By: Markus Daft M.D.   On: 10/06/2019 14:40   XR KNEE 3 VIEW RIGHT  Result Date: 10/03/2019 AP lateral bilateral sunrise patellar x-rays obtained right knee.  This shows slight narrowing left knee medial joint line.  Left knee shows no significant narrowing no degenerative or acute changes. Impression: Normal right knee radiographs.  Slight left knee medial joint line narrowing.

## 2019-10-08 NOTE — Progress Notes (Signed)
ANTICOAGULATION CONSULT NOTE - Follow-up   Pharmacy Consult for Warfarin Indication: atrial fibrillation  Allergies  Allergen Reactions  . Heparin Other (See Comments)    Thrombocytopenia. 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    Estimated Creatinine Clearance: 111.5 mL/min (by C-G formula based on SCr of 0.8 mg/dL).   Medical History: Past Medical History:  Diagnosis Date  . Anoxic encephalopathy (HCC) 10/2015   a. 10/2015 in setting of cardiac arrest - recovered prior to DC.  . Cardiac arrest with ventricular fibrillation (HCC)    a. 11/2015: Unclear etiology. Nl cors on cath 8/27. S/P cooling. On amiodarone and s/p ICD for secondary prevention on 12/01/15. EF down to 25-30% but normalized to 68 by subsequent cardiac MR  . COVID-19 10/06/2019  . HIT (heparin-induced thrombocytopenia) (HCC)    a. 11/2015 during admission for VF arrest. HIT panel positive. Placed on coumadin for 2 months  . HTN (hypertension)   . Hypokalemia    a. 11/2015: resolved on K supplementation and spironolactone  . ICD (implantable cardioverter-defibrillator) in place    a. Medtronic Visia AF MRI (serial  Number 12/2015 H) ICD.  [id number] Myocardial stunning    a. 11/2015: EF down to 20-25% after VF arrest but improved to ~68 by subsequent cardiac MR.  12/2015 NICM (nonischemic cardiomyopathy) (HCC)   . Obesity   . PAF (paroxysmal atrial fibrillation) (HCC)    a. 11/2015 brief  run of afib with RVR while intubated during a complicated admission for VF arrest and VDRF. No recurrence on amio. CHADSVASC 2 (CHF, HTN) and on coumadin for HIT with positive thrombotic markers. If no long term recurrence, he may be able to come off OAC  . Ventricular tachycardia (HCC) 10/2015    Medications:  Medications Prior to Admission  Medication Sig Dispense Refill Last Dose  . fluticasone (FLONASE) 50 MCG/ACT nasal spray Place 1 spray into both nostrils daily.   10/06/2019 at am  . guaifenesin (ROBITUSSIN) 100 MG/5ML syrup Take 200 mg by mouth 2 (two) times daily as needed for cough or congestion.   10/05/2019 at pm  . levothyroxine (SYNTHROID) 75 MCG tablet Take 75 mcg by mouth daily before breakfast.    10/06/2019 at am  . losartan (COZAAR) 25 MG tablet TAKE 1 TABLET BY MOUTH EVERY DAY (Patient taking differently: Take 25 mg by mouth daily. ) 90 tablet 2 10/06/2019 at am  . metoprolol succinate (TOPROL-XL) 100 MG 24 hr tablet Take 1 tablet (100 mg total) by mouth daily. (Patient taking differently: Take 100 mg by mouth daily after supper. ) 90 tablet 3 10/04/2019 at 1900  . mexiletine (MEXITIL) 200 MG capsule TAKE 1 CAPSULE BY MOUTH TWICE A DAY (Patient taking differently: Take 200 mg by mouth 2 (two) times daily. ) 180 capsule 2 10/06/2019 at  am  . Multiple Vitamin (MULTIVITAMIN WITH MINERALS) TABS tablet Take 1 tablet by mouth daily. Centrum Silver   10/06/2019 at am  . pantoprazole (PROTONIX) 40 MG tablet TAKE 1 TABLET BY MOUTH EVERY DAY (Patient taking differently: Take 40 mg by mouth daily. ) 90 tablet 3 10/06/2019 at am  . potassium chloride SA (KLOR-CON M20) 20 MEQ tablet TAKE 1 TABLET BY MOUTH TWICE A DAY (Patient taking differently: Take 20 mEq by mouth 2 (two) times daily. ) 180 tablet 2 10/06/2019 at am  . spironolactone (ALDACTONE) 25 MG tablet TAKE 1 TABLET BY MOUTH EVERY DAY (Patient taking differently: Take 25 mg by mouth daily. ) 90 tablet 2 10/06/2019 at am  . warfarin (COUMADIN) 5  MG tablet Take 1 to 1.5 tablets by mouth daily as directed by the coumadin clinic (Patient taking differently: Take 5-7.5 mg by mouth See admin instructions. Take 1 tablet (5 mg) by mouth on Saturday mornings, take 1 1/2 tablets (7.5 mg) on all other mornings of the week - or as directed by the coumadin clinic) 45 tablet 2 10/06/2019 at 7am   Assessment: 64 yo male COVID positive PNA with cough and SOB. Prior to admission warfarin 5 mg Sat and 7.5 mg rest of week for afib. Today's INR on 7/19 is therapeutic at 2.8. No signs and symptoms of bleeding and CBC is WNL.  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  Goal of Therapy:  INR 2-3 Monitor platelets by anticoagulation protocol: Yes   Plan:  7.5mg  of warfarin today, per home dose and current INR. Monitor INR and CBC daily.  Monitor diet and intake. Monitor for signs and symptoms of bleeding.   Lamar Sprinkles, PharmD PGY1 Pharmacy Resident Phone: [phone] 10/08/2019 10:23 AM

## 2019-10-08 NOTE — Progress Notes (Signed)
  Echocardiogram 2D Echocardiogram has been performed.  Gerda Diss 10/08/2019, 10:07 AM

## 2019-10-08 NOTE — Progress Notes (Signed)
Physical Therapy Treatment and Discharge Patient Details Name: Arthur Chase MRN: [id number] DOB: October 17, 1955 Today's Date: 10/08/2019    History of Present Illness Arthur Chase is a 64 y.o. 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  Patient reports onset of cough and SOB about 4 days ago, the ER work-up was suggestive of acute hypoxic respiratory failure due to COVID-19 pneumonia and he was admitted.  Soon after coming to the floor he developed paroxysmal A. fib RVR.Marland Kitchen    PT Comments    Patient moving well with sats >93% on room air while walking 320 ft. Patient with occasional coughing. Demonstrated proper use of IS up to 2500 ml. Stair goal deferred as there is no stairway to take COVID patients to and does not appear indicated/necessary for him. Goals otherwise met and patient discharged from PT.     Follow Up Recommendations  No PT follow up     Equipment Recommendations  None recommended by PT    Recommendations for Other Services       Precautions / Restrictions      Mobility  Bed Mobility Overal bed mobility: Independent                Transfers Overall transfer level: Independent                  Ambulation/Gait Ambulation/Gait assistance: Supervision;Independent Gait Distance (Feet): 320 Feet (laps in room; pt didn't want to wear mask in hall) Assistive device: None Gait Pattern/deviations: WFL(Within Functional Limits)     General Gait Details: monitoring HR, O2 sats   Stairs             Wheelchair Mobility    Modified Rankin (Stroke Patients Only)       Balance Overall balance assessment: No apparent balance deficits (not formally assessed)                                          Cognition Arousal/Alertness: Awake/alert Behavior During Therapy: WFL for tasks  assessed/performed Overall Cognitive Status: Within Functional Limits for tasks assessed                                        Exercises Other Exercises Other Exercises: sit to stand, not using UEs x 3 reps (pt then reported increasing knee pain with recent steroid injection to knee)-remainder of reps deferred Other Exercises: IS x 3 to 2536m    General Comments General comments (skin integrity, edema, etc.): VSS with pt on room air      Pertinent Vitals/Pain Pain Assessment: No/denies pain    Home Living Family/patient expects to be discharged to:: Private residence Living Arrangements: Children (son and grandson)   Type of Home: House Home Access: Stairs to enter Entrance Stairs-Rails: None Home Layout: One level        Prior Function Level of Independence: Independent          PT Goals (current goals can now be found in the care plan section) Acute Rehab PT Goals Patient Stated Goal: HOpes to be home soon Time For Goal Achievement: 10/21/19 Potential to Achieve Goals: Good Progress towards PT goals: Progressing  toward goals (goal 1 met; goal 2 deferred as no stairway for COVID pts)    Frequency           PT Plan Current plan remains appropriate    Co-evaluation              AM-PAC PT "6 Clicks" Mobility   Outcome Measure  Help needed turning from your back to your side while in a flat bed without using bedrails?: None Help needed moving from lying on your back to sitting on the side of a flat bed without using bedrails?: None Help needed moving to and from a bed to a chair (including a wheelchair)?: None Help needed standing up from a chair using your arms (e.g., wheelchair or bedside chair)?: None Help needed to walk in hospital room?: None Help needed climbing 3-5 steps with a railing? : None 6 Click Score: 24    End of Session   Activity Tolerance: Patient tolerated treatment well Patient left: in chair;with call  bell/phone within reach Nurse Communication: Mobility status PT Visit Diagnosis: Other abnormalities of gait and mobility (R26.89)     Time: 2423-5361 PT Time Calculation (min) (ACUTE ONLY): 19 min  Charges:  $Therapeutic Activity: 8-22 mins                      Arby Barrette, PT Pager [phone]    Rexanne Mano 10/08/2019, 1:09 PM

## 2019-10-09 LAB — COMPREHENSIVE METABOLIC PANEL
ALT: 21 U/L (ref 0–44)
AST: 19 U/L (ref 15–41)
Albumin: 3.1 g/dL — ABNORMAL LOW (ref 3.5–5.0)
Alkaline Phosphatase: 62 U/L (ref 38–126)
Anion gap: 9 (ref 5–15)
BUN: 22 mg/dL (ref 8–23)
CO2: 23 mmol/L (ref 22–32)
Calcium: 8.8 mg/dL — ABNORMAL LOW (ref 8.9–10.3)
Chloride: 106 mmol/L (ref 98–111)
Creatinine, Ser: 0.86 mg/dL (ref 0.61–1.24)
GFR calc Af Amer: >60 mL/min (ref >60)
GFR calc non Af Amer: >60 mL/min (ref >60)
Glucose, Bld: 144 mg/dL — ABNORMAL HIGH (ref 70–99)
Potassium: 4.4 mmol/L (ref 3.5–5.1)
Sodium: 138 mmol/L (ref 135–145)
Total Bilirubin: 0.8 mg/dL (ref 0.3–1.2)
Total Protein: 6.3 g/dL — ABNORMAL LOW (ref 6.5–8.1)

## 2019-10-09 LAB — CBC WITH DIFFERENTIAL/PLATELET
Abs Immature Granulocytes: 0.1 10*3/uL — ABNORMAL HIGH (ref 0.00–0.07)
Basophils Absolute: 0 10*3/uL (ref 0.0–0.1)
Basophils Relative: 0 %
Eosinophils Absolute: 0 10*3/uL (ref 0.0–0.5)
Eosinophils Relative: 0 %
HCT: 43.7 % (ref 39.0–52.0)
Hemoglobin: 14.4 g/dL (ref 13.0–17.0)
Immature Granulocytes: 1 %
Lymphocytes Relative: 14 %
Lymphs Abs: 1.8 10*3/uL (ref 0.7–4.0)
MCH: 30.8 pg (ref 26.0–34.0)
MCHC: 33 g/dL (ref 30.0–36.0)
MCV: 93.6 fL (ref 80.0–100.0)
Monocytes Absolute: 0.4 10*3/uL (ref 0.1–1.0)
Monocytes Relative: 3 %
Neutro Abs: 10.7 10*3/uL — ABNORMAL HIGH (ref 1.7–7.7)
Neutrophils Relative %: 82 %
Platelets: 329 10*3/uL (ref 150–400)
RBC: 4.67 MIL/uL (ref 4.22–5.81)
RDW: 13.2 % (ref 11.5–15.5)
WBC: 13 10*3/uL — ABNORMAL HIGH (ref 4.0–10.5)
nRBC: 0 % (ref 0.0–0.2)

## 2019-10-09 LAB — PROTIME-INR
INR: 3.2 — ABNORMAL HIGH (ref 0.8–1.2)
Prothrombin Time: 31.7 seconds — ABNORMAL HIGH (ref 11.4–15.2)

## 2019-10-09 LAB — C-REACTIVE PROTEIN: CRP: 0.6 mg/dL (ref <1.0)

## 2019-10-09 LAB — D-DIMER, QUANTITATIVE: D-Dimer, Quant: 0.51 ug/mL-FEU — ABNORMAL HIGH (ref 0.00–0.50)

## 2019-10-09 LAB — BRAIN NATRIURETIC PEPTIDE: B Natriuretic Peptide: 141.7 pg/mL — ABNORMAL HIGH (ref 0.0–100.0)

## 2019-10-09 LAB — MAGNESIUM: Magnesium: 2.4 mg/dL (ref 1.7–2.4)

## 2019-10-09 MED ORDER — LACTATED RINGERS IV BOLUS
1000.0000 mL | Freq: Once | INTRAVENOUS | Status: AC
  Administered 2019-10-09: 1000 mL via INTRAVENOUS

## 2019-10-09 NOTE — Discharge Instructions (Addendum)
Follow with Primary MD Shon Hale, MD in 7 days   Get CBC, CMP, INR, 2 view Chest X ray -  checked next visit within 1 week by Primary MD    Activity: As tolerated with Full fall precautions use walker/cane & assistance as needed  Disposition Home   Diet: Heart Healthy    Special Instructions: If you have smoked or chewed Tobacco  in the last 2 yrs please stop smoking, stop any regular Alcohol  and or any Recreational drug use.  On your next visit with your primary care physician please Get Medicines reviewed and adjusted.  Please request your Prim.MD to go over all Hospital Tests and Procedure/Radiological results at the follow up, please get all Hospital records sent to your Prim MD by signing hospital release before you go home.  If you experience worsening of your admission symptoms, develop shortness of breath, life threatening emergency, suicidal or homicidal thoughts you must seek medical attention immediately by calling 911 or calling your MD immediately  if symptoms less severe.  You Must read complete instructions/literature along with all the possible adverse reactions/side effects for all the Medicines you take and that have been prescribed to you. Take any new Medicines after you have completely understood and accpet all the possible adverse reactions/side effects.       Person Under Monitoring Name: Arthur Chase  Location: [address] [place] [address]   Infection Prevention Recommendations for Individuals Confirmed to have, or Being Evaluated for, 2019 Novel Coronavirus (COVID-19) Infection Who Receive Care at Home  Individuals who are confirmed to have, or are being evaluated for, COVID-19 should follow the prevention steps below until a healthcare provider or local or state health department says they can return to normal activities.  Stay home except to get medical care You should restrict activities outside your home, except for getting  medical care. Do not go to work, school, or public areas, and do not use public transportation or taxis.  Call ahead before visiting your doctor Before your medical appointment, call the healthcare provider and tell them that you have, or are being evaluated for, COVID-19 infection. This will help the healthcare provider's office take steps to keep other people from getting infected. Ask your healthcare provider to call the local or state health department.  Monitor your symptoms Seek prompt medical attention if your illness is worsening (e.g., difficulty breathing). Before going to your medical appointment, call the healthcare provider and tell them that you have, or are being evaluated for, COVID-19 infection. Ask your healthcare provider to call the local or state health department.  Wear a facemask You should wear a facemask that covers your nose and mouth when you are in the same room with other people and when you visit a healthcare provider. People who live with or visit you should also wear a facemask while they are in the same room with you.  Separate yourself from other people in your home As much as possible, you should stay in a different room from other people in your home. Also, you should use a separate bathroom, if available.  Avoid sharing household items You should not share dishes, drinking glasses, cups, eating utensils, towels, bedding, or other items with other people in your home. After using these items, you should wash them thoroughly with soap and water.  Cover your coughs and sneezes Cover your mouth and nose with a tissue when you cough or sneeze, or you can cough  or sneeze into your sleeve. Throw used tissues in a lined trash can, and immediately wash your hands with soap and water for at least 20 seconds or use an alcohol-based hand rub.  Wash your [facility] your hands often and thoroughly with soap and water for at least 20 seconds. You can use an  alcohol-based hand sanitizer if soap and water are not available and if your hands are not visibly dirty. Avoid touching your eyes, nose, and mouth with unwashed hands.   Prevention Steps for Caregivers and Household Members of Individuals Confirmed to have, or Being Evaluated for, COVID-19 Infection Being Cared for in the Home  If you live with, or provide care at home for, a person confirmed to have, or being evaluated for, COVID-19 infection please follow these guidelines to prevent infection:  Follow healthcare provider's instructions Make sure that you understand and can help the patient follow any healthcare provider instructions for all care.  Provide for the patient's basic needs You should help the patient with basic needs in the home and provide support for getting groceries, prescriptions, and other personal needs.  Monitor the patient's symptoms If they are getting sicker, call his or her medical provider and tell them that the patient has, or is being evaluated for, COVID-19 infection. This will help the healthcare provider's office take steps to keep other people from getting infected. Ask the healthcare provider to call the local or state health department.  Limit the number of people who have contact with the patient  If possible, have only one caregiver for the patient.  Other household members should stay in another home or place of residence. If this is not possible, they should stay  in another room, or be separated from the patient as much as possible. Use a separate bathroom, if available.  Restrict visitors who do not have an essential need to be in the home.  Keep older adults, very young children, and other sick people away from the patient Keep older adults, very young children, and those who have compromised immune systems or chronic health conditions away from the patient. This includes people with chronic heart, lung, or kidney conditions, diabetes, and  cancer.  Ensure good ventilation Make sure that shared spaces in the home have good air flow, such as from an air conditioner or an opened window, weather permitting.  Wash your hands often  Wash your hands often and thoroughly with soap and water for at least 20 seconds. You can use an alcohol based hand sanitizer if soap and water are not available and if your hands are not visibly dirty.  Avoid touching your eyes, nose, and mouth with unwashed hands.  Use disposable paper towels to dry your hands. If not available, use dedicated cloth towels and replace them when they become wet.  Wear a facemask and gloves  Wear a disposable facemask at all times in the room and gloves when you touch or have contact with the patient's blood, body fluids, and/or secretions or excretions, such as sweat, saliva, sputum, nasal mucus, vomit, urine, or feces.  Ensure the mask fits over your nose and mouth tightly, and do not touch it during use.  Throw out disposable facemasks and gloves after using them. Do not reuse.  Wash your hands immediately after removing your facemask and gloves.  If your personal clothing becomes contaminated, carefully remove clothing and launder. Wash your hands after handling contaminated clothing.  Place all used disposable facemasks, gloves, and other  waste in a lined container before disposing them with other household waste.  Remove gloves and wash your hands immediately after handling these items.  Do not share dishes, glasses, or other household items with the patient  Avoid sharing household items. You should not share dishes, drinking glasses, cups, eating utensils, towels, bedding, or other items with a patient who is confirmed to have, or being evaluated for, COVID-19 infection.  After the person uses these items, you should wash them thoroughly with soap and water.  Wash laundry thoroughly  Immediately remove and wash clothes or bedding that have blood, body  fluids, and/or secretions or excretions, such as sweat, saliva, sputum, nasal mucus, vomit, urine, or feces, on them.  Wear gloves when handling laundry from the patient.  Read and follow directions on labels of laundry or clothing items and detergent. In general, wash and dry with the warmest temperatures recommended on the label.  Clean all areas the individual has used often  Clean all touchable surfaces, such as counters, tabletops, doorknobs, bathroom fixtures, toilets, phones, keyboards, tablets, and bedside tables, every day. Also, clean any surfaces that may have blood, body fluids, and/or secretions or excretions on them.  Wear gloves when cleaning surfaces the patient has come in contact with.  Use a diluted bleach solution (e.g., dilute bleach with 1 part bleach and 10 parts water) or a household disinfectant with a label that says EPA-registered for coronaviruses. To make a bleach solution at home, add 1 tablespoon of bleach to 1 quart (4 cups) of water. For a larger supply, add  cup of bleach to 1 gallon (16 cups) of water.  Read labels of cleaning products and follow recommendations provided on product labels. Labels contain instructions for safe and effective use of the cleaning product including precautions you should take when applying the product, such as wearing gloves or eye protection and making sure you have good ventilation during use of the product.  Remove gloves and wash hands immediately after cleaning.  Monitor yourself for signs and symptoms of illness Caregivers and household members are considered close contacts, should monitor their health, and will be asked to limit movement outside of the home to the extent possible. Follow the monitoring steps for close contacts listed on the symptom monitoring form.   ? If you have additional questions, contact your local health department or call the epidemiologist on call at [phone] (available 24/7). ? This  guidance is subject to change. For the most up-to-date guidance from [employer], please refer to their website: [employer]Metro.hu  Information on my medicine - Coumadin   (Warfarin)  Why was Coumadin prescribed for you? Coumadin was prescribed for you because you have a blood clot or a medical condition that can cause an increased risk of forming blood clots. Blood clots can cause serious health problems by blocking the flow of blood to the heart, lung, or brain. Coumadin can prevent harmful blood clots from forming. As a reminder your indication for Coumadin is:   Stroke Prevention Because Of Atrial Fibrillation  What test will check on my response to Coumadin? While on Coumadin (warfarin) you will need to have an INR test regularly to ensure that your dose is keeping you in the desired range. The INR (international normalized ratio) number is calculated from the result of the laboratory test called prothrombin time (PT).  If an INR APPOINTMENT HAS NOT ALREADY BEEN MADE FOR YOU please schedule an appointment to have this lab work done by your health  care provider within 7 days. Your INR goal is usually a number between:  2 to 3 or your provider may give you a more narrow range like 2-2.5.  Ask your health care provider during an office visit what your goal INR is.  What  do you need to  know  About  COUMADIN? Take Coumadin (warfarin) exactly as prescribed by your healthcare provider about the same time each day.  DO NOT stop taking without talking to the doctor who prescribed the medication.  Stopping without other blood clot prevention medication to take the place of Coumadin may increase your risk of developing a new clot or stroke.  Get refills before you run out.  What do you do if you miss a dose? If you miss a dose, take it as soon as you remember on the same day then continue your regularly scheduled regimen the next day.  Do not take two  doses of Coumadin at the same time.  Important Safety Information A possible side effect of Coumadin (Warfarin) is an increased risk of bleeding. You should call your healthcare provider right away if you experience any of the following: ? Bleeding from an injury or your nose that does not stop. ? Unusual colored urine (red or dark brown) or unusual colored stools (red or black). ? Unusual bruising for unknown reasons. ? A serious fall or if you hit your head (even if there is no bleeding).  Some foods or medicines interact with Coumadin (warfarin) and might alter your response to warfarin. To help avoid this: ? Eat a balanced diet, maintaining a consistent amount of Vitamin K. ? Notify your provider about major diet changes you plan to make. ? Avoid alcohol or limit your intake to 1 drink for women and 2 drinks for men per day. (1 drink is 5 oz. wine, 12 oz. beer, or 1.5 oz. liquor.)  Make sure that ANY health care provider who prescribes medication for you knows that you are taking Coumadin (warfarin).  Also make sure the healthcare provider who is monitoring your Coumadin knows when you have started a new medication including herbals and non-prescription products.  Coumadin (Warfarin)  Major Drug Interactions  Increased Warfarin Effect Decreased Warfarin Effect  Alcohol (large quantities) Antibiotics (esp. Septra/Bactrim, Flagyl, Cipro) Amiodarone (Cordarone) Aspirin (ASA) Cimetidine (Tagamet) Megestrol (Megace) NSAIDs (ibuprofen, naproxen, etc.) Piroxicam (Feldene) Propafenone (Rythmol SR) Propranolol (Inderal) Isoniazid (INH) Posaconazole (Noxafil) Barbiturates (Phenobarbital) Carbamazepine (Tegretol) Chlordiazepoxide (Librium) Cholestyramine (Questran) Griseofulvin Oral Contraceptives Rifampin Sucralfate (Carafate) Vitamin K   Coumadin (Warfarin) Major Herbal Interactions  Increased Warfarin Effect Decreased Warfarin Effect  Garlic Ginseng Ginkgo biloba Coenzyme  Q10 Green tea St. John's wort    Coumadin (Warfarin) FOOD Interactions  Eat a consistent number of servings per week of foods HIGH in Vitamin K (1 serving =  cup)  Collards (cooked, or boiled & drained) Kale (cooked, or boiled & drained) Mustard greens (cooked, or boiled & drained) Parsley *serving size only =  cup Spinach (cooked, or boiled & drained) Swiss chard (cooked, or boiled & drained) Turnip greens (cooked, or boiled & drained)  Eat a consistent number of servings per week of foods MEDIUM-HIGH in Vitamin K (1 serving = 1 cup)  Asparagus (cooked, or boiled & drained) Broccoli (cooked, boiled & drained, or raw & chopped) Brussel sprouts (cooked, or boiled & drained) *serving size only =  cup Lettuce, raw (green leaf, endive, romaine) Spinach, raw Turnip greens, raw & chopped   These websites have more  information on Coumadin (warfarin):  http://www.king-russell.com/; https://www.hines.net/;

## 2019-10-09 NOTE — Progress Notes (Signed)
PROGRESS NOTE                                                                                                                                                                                                             Patient Demographics:    Arthur Chase, is a 64 y.o. male, DOB - [birth year], LNL:[id number]  Outpatient Primary MD for the patient is Glenis Smoker, MD    LOS - 3  Admit date - 10/06/2019    Chief Complaint  Patient presents with  . Shortness of Breath  . Cough       Brief Narrative - Arthur Chase is a 64 y.o. 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  Patient reports onset of cough and SOB about 4 days ago, the ER work-up was suggestive of acute hypoxic respiratory failure due to COVID-19 pneumonia and he was admitted.  Soon after coming to the floor he developed paroxysmal A. fib RVR.Marland Kitchen   Subjective:   Patient in bed, appears comfortable, denies any headache, no fever, no chest pain or pressure, no shortness of breath , no abdominal pain. No focal weakness.    Assessment  & Plan :     1. Acute Hypoxic Resp. Failure due to Acute Covid 19 Viral Pneumonitis during the ongoing 2020 Covid 19 Pandemic - he has moderate disease so far, unfortunately he is not vaccinated, he has been treated with steroids and remdesivir with clinical stabilization, pulmonary status has stabilized.  Encouraged the patient to sit up in chair in the daytime use I-S and flutter valve for pulmonary toiletry and then prone in bed when at night.  Will advance activity and titrate down oxygen as possible.   SpO2: 94 % O2 Flow Rate (L/min): 1 L/min  Recent Labs  Lab 10/06/19 1331 10/06/19 1340 10/06/19 1403 10/07/19 0209 10/08/19 0303 10/09/19 0135  CRP  --  4.2*  --  4.0* 1.1*  0.6  DDIMER  --  1.08*  --  1.07* 0.71* 0.51*  BNP 353.4*  --   --  145.6* 101.3* 141.7*  PROCALCITON  --  <0.10  --   --   --   --   SARSCOV2NAA  --   --  POSITIVE*  --   --   --     Hepatic Function Latest Ref Rng & Units 10/09/2019 10/08/2019 10/07/2019  Total Protein 6.5 - 8.1 g/dL 6.3(L) 7.0 7.1  Albumin 3.5 - 5.0 g/dL 3.1(L) 3.3(L) 3.3(L)  AST 15 - 41 U/L _0 ALT 0 - 44 U/L _1 Alk Phosphatase 38 - 126 U/L 62 65 60  Total Bilirubin 0.3 - 1.2 mg/dL 0.8 0.8 0.6  Bilirubin, Direct 0.00 - 0.40 mg/dL - - -   Lab Results  Component Value Date   TSH 1.851 10/07/2019    2.  Paroxysmal A. fib RVR [place] vas 2 score of at least 2.  He was initially on IV Cardizem drip, also received 2 doses of IV digoxin, now transition to high-dose Toprol-XL with stabilization and good rate control, continue Coumadin. Stable TSH and echocardiogram with preserved EF of 55 to 60%.  3.  History of V. tach.  Has AICD.  Monitor electrolytes.  4.  Nonischemic cardiomyopathy in the past.  Latest echo shows a EF of 55 to 60%.  Continue beta-blocker, ARB combination and monitor.  5.  Hypertension.  Blood pressure soft morning of 10/09/2019, 1 L LR bolus, hold ARB and monitor.  6.  Hypothyroidism.  On Synthroid.      Condition - Extremely Guarded  Family Communication  :   Arthur Chase [phone] on 10/07/2019  Code Status :  Full  Consults  :  None  Procedures  :    Echo - 1. Left ventricular ejection fraction, by estimation, is 55 to 60%. The left ventricle has normal function. The left ventricle has no regional wall motion abnormalities. Left ventricular diastolic function could not be evaluated.  2. Right ventricular systolic function is normal. The right ventricular size is normal. There is normal pulmonary artery systolic pressure.  3. The mitral valve is abnormal. No evidence of mitral stenosis.  4. The aortic valve is tricuspid. Aortic valve regurgitation is not visualized. No  aortic stenosis is present.  5. The inferior vena cava is normal in size with greater than 50% respiratory variability, suggesting right atrial pressure of 3 mmHg.   PUD Prophylaxis : PPI  Disposition Plan  :    Status is: Inpatient  Remains inpatient appropriate because:IV treatments appropriate due to intensity of illness or inability to take PO   Dispo: The patient is from: Home              Anticipated d/c is to: Home              Anticipated d/c date is: > 3 days              Patient currently is not medically stable to d/c.   DVT Prophylaxis  :  [place]  Lab Results  Component Value Date   INR 3.2 (H) 10/09/2019   INR 2.8 (H) 10/08/2019   INR 2.5 (H) 10/07/2019    Lab Results  Component Value Date   PLT 329 10/09/2019    Diet :  Diet Order            Diet Heart Room service appropriate? Yes; Fluid consistency: Thin  Diet effective now                  Inpatient Medications  Scheduled Meds: . Chlorhexidine Gluconate Cloth  6 each Topical Q0600  . dexamethasone (DECADRON) injection  6 mg Intravenous Q24H  . levothyroxine  75  mcg Oral Q0600  . metoprolol succinate  100 mg Oral Daily  . mexiletine  200 mg Oral BID  . mupirocin ointment  1 application Nasal BID  . pantoprazole  40 mg Oral Daily  . Warfarin - Pharmacist Dosing Inpatient   Does not apply q1600   Continuous Infusions: . lactated ringers    . remdesivir 100 mg in NS 100 mL 100 mg (10/09/19 0837)   PRN Meds:.acetaminophen, albuterol, bisacodyl, chlorpheniramine-HYDROcodone, diltiazem, guaiFENesin-dextromethorphan, [DISCONTINUED] ondansetron **OR** ondansetron (ZOFRAN) IV, polyethylene glycol, sodium phosphate  Antibiotics  :    Anti-infectives (From admission, onward)   Start     Dose/Rate Route Frequency Ordered Stop   10/07/19 1000  remdesivir 100 mg in sodium chloride 0.9 % 100 mL IVPB     Discontinue    "Followed by" Linked Group Details   100 mg 200 mL/hr over 30 Minutes  Intravenous Daily 10/06/19 1618 10/11/19 0959   10/06/19 1630  remdesivir 200 mg in sodium chloride 0.9% 250 mL IVPB       "Followed by" Linked Group Details   200 mg 580 mL/hr over 30 Minutes Intravenous Once 10/06/19 1618 10/06/19 1815       Time Spent in minutes  30   Lala Lund M.D on 10/09/2019 at 9:05 AM  To page go to [email or web address] - password [facility]  [facility] -  Office  [phone]    See all Orders from today for further details    Objective:   Vitals:   10/08/19 2003 10/09/19 0011 10/09/19 0419 10/09/19 0753  BP: 96/60 95/70 100/71 95/68  Pulse: 69 66 63 65  Resp: _0 Temp: 97.9 F (36.6 C) 97.9 F (36.6 C) 97.8 F (36.6 C) 97.8 F (36.6 C)  TempSrc: Oral Axillary Oral Oral  SpO2: 94% 93% 92% 94%  Weight:      Height:        Wt Readings from Last 3 Encounters:  10/06/19 98.4 kg  05/21/19 102.8 kg  11/17/18 98.4 kg     Intake/Output Summary (Last 24 hours) at 10/09/2019 0905 Last data filed at 10/09/2019 0017 Gross per 24 hour  Intake 240 ml  Output 200 ml  Net 40 ml     Physical Exam  Awake Alert, No new F.N deficits, Normal affect [place].AT,PERRAL Supple Neck,No JVD, No cervical lymphadenopathy appriciated.  Symmetrical Chest wall movement, Good air movement bilaterally, CTAB RRR,No Gallops, Rubs or new Murmurs, No Parasternal Heave +ve B.Sounds, Abd Soft, No tenderness, No organomegaly appriciated, No rebound - guarding or rigidity. No Cyanosis, Clubbing or edema, No new Rash or bruise    Data Review:    CBC Recent Labs  Lab 10/06/19 1340 10/07/19 0209 10/08/19 0303 10/09/19 0135  WBC 7.9 6.1 10.9* 13.0*  HGB 17.3* 15.8 15.3 14.4  HCT 51.9 47.5 45.8 43.7  PLT 225 247 276 329  MCV 93.0 94.1 93.1 93.6  MCH 31.0 31.3 31.1 30.8  MCHC 33.3 33.3 33.4 33.0  RDW 12.9 13.2 13.0 13.2  LYMPHSABS 2.4 1.7 1.8 1.8  MONOABS 0.5 0.3 0.5 0.4  EOSABS 0.0 0.0 0.0 0.0  BASOSABS 0.0 0.0 0.0 0.0    Chemistries  Recent Labs   Lab 10/06/19 1340 10/07/19 0209 10/08/19 0303 10/09/19 0135  NA 134* 138 137 138  K 4.4 4.7 4.4 4.4  CL 103 105 108 106  CO2 17* 23 21* 23  GLUCOSE 105* 144* 143* 144*  BUN _1 CREATININE 0.87 0.98  0.80 0.86  CALCIUM 8.7* 8.9 9.1 8.8*  AST 36 _0 ALT _1 ALKPHOS 70 60 65 62  BILITOT 0.8 0.6 0.8 0.8  MG 2.3 2.5* 2.3 2.4  INR 2.2* 2.5* 2.8* 3.2*  TSH 3.762 1.851  --   --      ------------------------------------------------------------------------------------------------------------------ Recent Labs    10/06/19 1340  TRIG 136    Lab Results  Component Value Date   HGBA1C 5.4 11/26/2015   ------------------------------------------------------------------------------------------------------------------ Recent Labs    10/07/19 0209  TSH 1.851    Cardiac Enzymes No results for input(s): CKMB, TROPONINI, MYOGLOBIN in the last 168 hours.  Invalid input(s): CK ------------------------------------------------------------------------------------------------------------------    Component Value Date/Time   BNP 141.7 (H) 10/09/2019 0135    Micro Results Recent Results (from the past 240 hour(s))  Blood Culture (routine x 2)     Status: None (Preliminary result)   Collection Time: 10/06/19  1:40 PM   Specimen: BLOOD  Result Value Ref Range Status   Specimen Description BLOOD RIGHT ANTECUBITAL  Final   Special Requests   Final    BOTTLES DRAWN AEROBIC AND ANAEROBIC Blood Culture adequate volume   Culture   Final    NO GROWTH 3 DAYS Performed at [facility], [address] [place], [place] [address]    Report Status PENDING  Incomplete  SARS Coronavirus 2 by RT PCR (hospital order, performed in [facility] hospital lab) Nasopharyngeal Nasopharyngeal Swab     Status: Abnormal   Collection Time: 10/06/19  2:03 PM   Specimen: Nasopharyngeal Swab  Result Value Ref Range Status   SARS Coronavirus 2 POSITIVE (A) NEGATIVE Final     Comment: RESULT CALLED TO, READ BACK BY AND VERIFIED WITH: RN A COSTALES [id number] (NOTE) SARS-CoV-2 target nucleic acids are DETECTED  SARS-CoV-2 RNA is generally detectable in upper respiratory specimens  during the acute phase of infection.  Positive results are indicative  of the presence of the identified virus, but do not rule out bacterial infection or co-infection with other pathogens not detected by the test.  Clinical correlation with patient history and  other diagnostic information is necessary to determine patient infection status.  The expected result is negative.  Fact Sheet for Patients:   StrictlyIdeas.no   Fact Sheet for Healthcare Providers:   BankingDealers.co.za    This test is not yet approved or cleared by the [place] FDA and  has been authorized for detection and/or diagnosis of SARS-CoV-2 by FDA under an Emergency Use Authorization (EUA).  This EUA will remain in effect (meaning this test ca n be used) for the duration of  the COVID-19 declaration under Section 564(b)(1) of the Act, 21 U.S.C. section 360-bbb-3(b)(1), unless the authorization is terminated or revoked sooner.  Performed at [facility], [place] [address] [place], [place] [address]   Blood Culture (routine x 2)     Status: None (Preliminary result)   Collection Time: 10/06/19  3:24 PM   Specimen: BLOOD LEFT FOREARM  Result Value Ref Range Status   Specimen Description BLOOD LEFT FOREARM  Final   Special Requests   Final    BOTTLES DRAWN AEROBIC AND ANAEROBIC Blood Culture adequate volume   Culture   Final    NO GROWTH 3 DAYS Performed at [facility], [place] [address] [place], [place] [address]    Report Status PENDING  Incomplete  MRSA PCR Screening     Status:  Abnormal   Collection Time: 10/06/19 11:34 PM   Specimen: Nasal Mucosa; Nasopharyngeal  Result Value Ref Range Status   MRSA by PCR POSITIVE (A) NEGATIVE  Final    Comment:        The GeneXpert MRSA Assay (FDA approved for NASAL specimens only), is one component of a comprehensive MRSA colonization surveillance program. It is not intended to diagnose MRSA infection nor to guide or monitor treatment for MRSA infections. RESULT CALLED TO, READ BACK BY AND VERIFIED WITH: GAMBLE,B RN 0103 10/07/2019 MITCHELL,L Performed at [facility], [place] [address] [place], [place] [address]     Radiology Reports DG Chest [place] 1 View  Result Date: 10/06/2019 CLINICAL DATA:  Shortness of breath.  Cough. EXAM: PORTABLE CHEST 1 VIEW COMPARISON:  12/02/2015 FINDINGS: Stable position of the left cardiac ICD. Heart size is within normal limits. Surgical plate in the lower cervical spine. Small new densities at the left lung base. Otherwise, the lungs are clear. Negative for a pneumothorax. IMPRESSION: Small densities at the left lung base are nonspecific. Findings could represent atelectasis versus small focus of infection. Electronically Signed   By: Markus Daft M.D.   On: 10/06/2019 14:40   ECHOCARDIOGRAM COMPLETE  Result Date: 10/08/2019    ECHOCARDIOGRAM LIMITED REPORT   Patient Name:   Arthur Chase Date of Exam: 10/08/2019 Medical Rec #:  [id number]        Height:       71.0 in Accession #:    [id number]       Weight:       217.0 lb Date of Birth:  03/28/1955         BSA:          2.183 m Patient Age:    8 years         BP:           97/70 mmHg Patient Gender: M                HR:           78 bpm. Exam Location:  Inpatient Procedure: Limited Echo, Cardiac Doppler and Color Doppler Indications:    CHF-Acute Diastolic 735.32 / D92.42  History:        Patient has prior history of Echocardiogram examinations, most                 recent 11/15/2018. Defibrillator, Arrythmias:Atrial Fibrillation;                 Risk Factors:Hypertension. NICM. COVID-19.  Sonographer:    Clayton Lefort RDCS (AE) Referring Phys: [id number] Margaree Mackintosh [facility]  Sonographer Comments: Suboptimal  parasternal window. COVID-19. IMPRESSIONS  1. Left ventricular ejection fraction, by estimation, is 55 to 60%. The left ventricle has normal function. The left ventricle has no regional wall motion abnormalities. Left ventricular diastolic function could not be evaluated.  2. Right ventricular systolic function is normal. The right ventricular size is normal. There is normal pulmonary artery systolic pressure.  3. The mitral valve is abnormal. No evidence of mitral stenosis.  4. The aortic valve is tricuspid. Aortic valve regurgitation is not visualized. No aortic stenosis is present.  5. The inferior vena cava is normal in size with greater than 50% respiratory variability, suggesting right atrial pressure of 3 mmHg. Comparison(s): No significant change from prior study. Conclusion(s)/Recommendation(s): Normal biventricular function without evidence of hemodynamically significant valvular heart disease. FINDINGS  Left Ventricle: Left ventricular ejection fraction, by  estimation, is 55 to 60%. The left ventricle has normal function. The left ventricle has no regional wall motion abnormalities. The left ventricular internal cavity size was normal in size. Left ventricular diastolic function could not be evaluated due to atrial fibrillation. Right Ventricle: The right ventricular size is normal. Right ventricular systolic function is normal. There is normal pulmonary artery systolic pressure. The tricuspid regurgitant velocity is 2.31 m/s, and with an assumed right atrial pressure of 3 mmHg,  the estimated right ventricular systolic pressure is 76.5 mmHg. Left Atrium: Left atrial size was not assessed. Right Atrium: Right atrial size was not assessed. Pericardium: There is no evidence of pericardial effusion. Presence of pericardial fat pad. Mitral Valve: The mitral valve is abnormal. 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 No evidence of mitral valve stenosis. Tricuspid  Valve: The tricuspid valve is normal in structure. Tricuspid valve regurgitation is trivial. No evidence of tricuspid stenosis. Aortic Valve: The aortic valve is tricuspid. Aortic valve regurgitation is not visualized. No aortic stenosis is present. Aortic valve mean gradient measures 3.0 mmHg. Aortic valve peak gradient measures 4.2 mmHg. Aortic valve area, by VTI measures 2.53 cm. Pulmonic Valve: The pulmonic valve was not well visualized. Aorta: The aortic root is normal in size and structure. Venous: The inferior vena cava is normal in size with greater than 50% respiratory variability, suggesting right atrial pressure of 3 mmHg. IAS/Shunts: The atrial septum is grossly normal. Additional Comments: A pacer wire is visualized in the right atrium and right ventricle. LEFT VENTRICLE PLAX 2D LVOT diam:     2.10 cm LV SV:         48 LV SV Index:   22 LVOT Area:     3.46 cm  IVC IVC diam: 1.60 cm AORTIC VALVE AV Area (Vmax):    2.81 cm AV Area (Vmean):   2.18 cm AV Area (VTI):     2.53 cm AV Vmax:           103.00 cm/s AV Vmean:          77.100 cm/s AV VTI:            0.192 m AV Peak Grad:      4.2 mmHg AV Mean Grad:      3.0 mmHg LVOT Vmax:         83.60 cm/s LVOT Vmean:        48.600 cm/s LVOT VTI:          0.140 m LVOT/AV VTI ratio: 0.73 TRICUSPID VALVE TR Peak grad:   21.3 mmHg TR Vmax:        231.00 cm/s  SHUNTS Systemic VTI:  0.14 m Systemic Diam: 2.10 cm Buford Dresser MD Electronically signed by Buford Dresser MD Signature Date/Time: 10/08/2019/11:52:25 AM    Final    XR KNEE 3 VIEW RIGHT  Result Date: 10/03/2019 AP lateral bilateral sunrise patellar x-rays obtained right knee.  This shows slight narrowing left knee medial joint line.  Left knee shows no significant narrowing no degenerative or acute changes. Impression: Normal right knee radiographs.  Slight left knee medial joint line narrowing.

## 2019-10-09 NOTE — Progress Notes (Signed)
ANTICOAGULATION CONSULT NOTE - Follow-up   Pharmacy Consult for Warfarin Indication: atrial fibrillation  Allergies  Allergen Reactions  . Heparin Other (See Comments)    Thrombocytopenia. 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    Estimated Creatinine Clearance: 103.7 mL/min (by C-G formula based on SCr of 0.86 mg/dL).  Assessment: 64 yo male COVID positive PNA with cough and SOB. Prior to admission warfarin 5 mg Sat and 7.5 mg rest of week for afib. Today's INR on 7/20 is above goal at 3.2. No signs and symptoms of bleeding and CBC is WNL.  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   Goal of Therapy:  INR 2-3 Monitor platelets by anticoagulation protocol: Yes   Plan:  Hold warfarin today for high INR. Monitor INR and CBC daily.  Monitor diet and intake. Monitor for signs and symptoms of bleeding.   2018, PharmD PGY1 Pharmacy Resident 10/09/2019 8:21 AM

## 2019-10-10 LAB — COMPREHENSIVE METABOLIC PANEL
ALT: 24 U/L (ref 0–44)
AST: 21 U/L (ref 15–41)
Albumin: 2.8 g/dL — ABNORMAL LOW (ref 3.5–5.0)
Alkaline Phosphatase: 53 U/L (ref 38–126)
Anion gap: 8 (ref 5–15)
BUN: 18 mg/dL (ref 8–23)
CO2: 25 mmol/L (ref 22–32)
Calcium: 8.8 mg/dL — ABNORMAL LOW (ref 8.9–10.3)
Chloride: 106 mmol/L (ref 98–111)
Creatinine, Ser: 0.73 mg/dL (ref 0.61–1.24)
GFR calc Af Amer: >60 mL/min (ref >60)
GFR calc non Af Amer: >60 mL/min (ref >60)
Glucose, Bld: 137 mg/dL — ABNORMAL HIGH (ref 70–99)
Potassium: 4.3 mmol/L (ref 3.5–5.1)
Sodium: 139 mmol/L (ref 135–145)
Total Bilirubin: 0.9 mg/dL (ref 0.3–1.2)
Total Protein: 5.9 g/dL — ABNORMAL LOW (ref 6.5–8.1)

## 2019-10-10 LAB — CBC WITH DIFFERENTIAL/PLATELET
Abs Immature Granulocytes: 0.09 10*3/uL — ABNORMAL HIGH (ref 0.00–0.07)
Basophils Absolute: 0 10*3/uL (ref 0.0–0.1)
Basophils Relative: 0 %
Eosinophils Absolute: 0 10*3/uL (ref 0.0–0.5)
Eosinophils Relative: 0 %
HCT: 41.5 % (ref 39.0–52.0)
Hemoglobin: 13.7 g/dL (ref 13.0–17.0)
Immature Granulocytes: 1 %
Lymphocytes Relative: 15 %
Lymphs Abs: 1.6 10*3/uL (ref 0.7–4.0)
MCH: 31.4 pg (ref 26.0–34.0)
MCHC: 33 g/dL (ref 30.0–36.0)
MCV: 95.2 fL (ref 80.0–100.0)
Monocytes Absolute: 0.5 10*3/uL (ref 0.1–1.0)
Monocytes Relative: 4 %
Neutro Abs: 8.5 10*3/uL — ABNORMAL HIGH (ref 1.7–7.7)
Neutrophils Relative %: 80 %
Platelets: 322 10*3/uL (ref 150–400)
RBC: 4.36 MIL/uL (ref 4.22–5.81)
RDW: 13.1 % (ref 11.5–15.5)
WBC: 10.7 10*3/uL — ABNORMAL HIGH (ref 4.0–10.5)
nRBC: 0 % (ref 0.0–0.2)

## 2019-10-10 LAB — C-REACTIVE PROTEIN: CRP: 0.8 mg/dL (ref <1.0)

## 2019-10-10 LAB — PROTIME-INR
INR: 3.4 — ABNORMAL HIGH (ref 0.8–1.2)
Prothrombin Time: 33.3 seconds — ABNORMAL HIGH (ref 11.4–15.2)

## 2019-10-10 LAB — D-DIMER, QUANTITATIVE: D-Dimer, Quant: 0.6 ug/mL-FEU — ABNORMAL HIGH (ref 0.00–0.50)

## 2019-10-10 LAB — BRAIN NATRIURETIC PEPTIDE: B Natriuretic Peptide: 140.1 pg/mL — ABNORMAL HIGH (ref 0.0–100.0)

## 2019-10-10 LAB — MAGNESIUM: Magnesium: 2.3 mg/dL (ref 1.7–2.4)

## 2019-10-10 MED ORDER — ALBUTEROL SULFATE HFA 108 (90 BASE) MCG/ACT IN AERS: 2.0000 | INHALATION_SPRAY | Freq: Four times a day (QID) | RESPIRATORY_TRACT | 0 refills | Status: AC | PRN

## 2019-10-10 MED ORDER — LOSARTAN POTASSIUM 25 MG PO TABS: 12.5000 mg | ORAL_TABLET | Freq: Every day | ORAL | 0 refills | Status: DC

## 2019-10-10 MED ORDER — WARFARIN SODIUM 2.5 MG PO TABS: 2.5000 mg | ORAL_TABLET | Freq: Once | ORAL | Status: DC

## 2019-10-10 MED ORDER — LACTATED RINGERS IV BOLUS
500.0000 mL | Freq: Once | INTRAVENOUS | Status: AC
  Administered 2019-10-10: 500 mL via INTRAVENOUS

## 2019-10-10 NOTE — Progress Notes (Signed)
ANTICOAGULATION CONSULT NOTE - Follow-up    Pharmacy Consult for Warfarin Indication: atrial fibrillation  Allergies  Allergen Reactions  . Heparin Other (See Comments)    Thrombocytopenia. 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    Estimated Creatinine Clearance: 111.5 mL/min (by C-G formula based on SCr of 0.73 mg/dL).  Assessment: 64 yo male COVID positive PNA with cough and SOB. Prior to admission warfarin 5 mg Sat and 7.5 mg rest of week for afib. 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   INR above goal again today at 3.4, dose held yesterday. No signs and symptoms of bleeding documented. CBC is WNL.  Goal of Therapy:  INR 2-3 Monitor platelets by anticoagulation protocol: Yes   Plan:  Give warfarin 2.5 mg today to avoid INR dropping below goal. Monitor INR and CBC daily.  Monitor diet and intake. Monitor for signs and symptoms of bleeding.   2018, PharmD PGY1 Acute Care Pharmacy Resident 10/10/2019 10:35 AM  Please check [email or web address] for unit-specific pharmacy phone numbers.

## 2019-10-10 NOTE — Progress Notes (Signed)
°   10/10/19 1201  AVS Discharge Documentation  AVS Discharge Instructions Including Medications Provided to patient/caregiver  Name of Person Receiving AVS Discharge Instructions Including Medications Arthur Chase  Name of Clinician That Reviewed AVS Discharge Instructions Including Medications Shella Maxim, RN

## 2019-10-10 NOTE — Discharge Summary (Signed)
Arthur Chase:[id number] DOB: 1956/02/26 DOA: 10/06/2019  PCP: Glenis Smoker, MD  Admit date: 10/06/2019  Discharge date: 10/10/2019  Admitted From: Home   Disposition:  Home   Recommendations for Outpatient Follow-up:   Follow up with PCP in 1-2 weeks  PCP Please obtain BMP/CBC, 2 view CXR in 1week,  (see Discharge instructions)   PCP Please follow up on the following pending results:    Home Health: None  Equipment/Devices: None  Consultations: None  Discharge Condition: Stable    CODE STATUS: Full    Diet Recommendation: Heart Healthy   Diet Order            Diet - low sodium heart healthy           Diet Heart Room service appropriate? Yes; Fluid consistency: Thin  Diet effective now                  Chief Complaint  Patient presents with   Shortness of Breath   Cough     Brief history of present illness from the day of admission and additional interim summary    Arthur Chase a 64 y.o.malewith medical history significant ofPAF on Coumadin; obesity (BMI 30.27); vfib arrest (2017); ICD placement; HIT; NICM with EF 20-25% in 2017; and HTN presenting with fever, SOB.Patient reports onset of cough and SOB about 4 days ago, the ER work-up was suggestive of acute hypoxic respiratory failure due to COVID-19 pneumonia and he was admitted.  Soon after coming to the floor he developed paroxysmal A. fib RVR                                                                 Hospital Course   1. Acute Hypoxic Resp. Failure due to Acute Covid 19 Viral Pneumonitis during the ongoing 2020 Covid 19 Pandemic - he had moderate disease and was unfortunately not vaccinated for COVID-19, he was treated with steroids and remdesivir and responded very well to the treatment, he is now symptom-free  on room air and will be discharged home with PCP follow-up in a week.   Recent Labs  Lab 10/06/19 1331 10/06/19 1340 10/06/19 1403 10/07/19 0209 10/08/19 0303 10/09/19 0135 10/10/19 0330  CRP  --  4.2*  --  4.0* 1.1* 0.6 0.8  DDIMER  --  1.08*  --  1.07* 0.71* 0.51* 0.60*  FERRITIN  --  858*  --  747*  --   --   --   BNP 353.4*  --   --  145.6* 101.3* 141.7* 140.1*  PROCALCITON  --  <0.10  --   --   --   --   --   SARSCOV2NAA  --   --  POSITIVE*  --   --   --   --  Hepatic Function Latest Ref Rng & Units 10/10/2019 10/09/2019 10/08/2019  Total Protein 6.5 - 8.1 g/dL 5.9(L) 6.3(L) 7.0  Albumin 3.5 - 5.0 g/dL 2.8(L) 3.1(L) 3.3(L)  AST 15 - 41 U/L _0 ALT 0 - 44 U/L _1 Alk Phosphatase 38 - 126 U/L 53 62 65  Total Bilirubin 0.3 - 1.2 mg/dL 0.9 0.8 0.8  Bilirubin, Direct 0.00 - 0.40 mg/dL - - -    2.  Paroxysmal A. fib RVR [place] vas 2 score of at least 2.  He was initially on IV Cardizem drip, also received 2 doses of IV digoxin, now transition to high-dose Toprol-XL with stabilization and good rate control, continue Coumadin. Stable TSH and echocardiogram with preserved EF of 55 to 60%.  Will follow with his EP physician Dr. Curt Bears within a week of discharge.  3.  History of V. tach.  Has AICD.  Monitor electrolytes.  4.  Nonischemic cardiomyopathy in the past.  Latest echo shows a EF of 55 to 60%.  Continue beta-blocker, ARB combination and monitor.  ARB dose cut in half due to borderline low blood pressures.  5.  Hypertension.    Home medications continued but ARB cut in half due to soft blood pressures.  PCP to monitor.  6.  Hypothyroidism.  On Synthroid.   Discharge diagnosis     Principal Problem:   Acute hypoxemic respiratory failure due to COVID-19 [facility]) Active Problems:   Atrial fibrillation with rapid ventricular response (HCC)   HIT (heparin-induced thrombocytopenia) (HCC)   HTN (hypertension)   ICD (implantable cardioverter-defibrillator) in  place   Obesity    Discharge instructions    Discharge Instructions    Amb referral to AFIB Clinic   Complete by: As directed    Diet - low sodium heart healthy   Complete by: As directed    Discharge instructions   Complete by: As directed    Follow with Primary MD Glenis Smoker, MD in 7 days   Get CBC, CMP, INR, 2 view Chest X ray -  checked next visit within 1 week by Primary MD    Activity: As tolerated with Full fall precautions use walker/cane & assistance as needed  Disposition Home   Diet: Heart Healthy    Special Instructions: If you have smoked or chewed Tobacco  in the last 2 yrs please stop smoking, stop any regular Alcohol  and or any Recreational drug use.  On your next visit with your primary care physician please Get Medicines reviewed and adjusted.  Please request your Prim.MD to go over all Hospital Tests and Procedure/Radiological results at the follow up, please get all Hospital records sent to your Prim MD by signing hospital release before you go home.  If you experience worsening of your admission symptoms, develop shortness of breath, life threatening emergency, suicidal or homicidal thoughts you must seek medical attention immediately by calling 911 or calling your MD immediately  if symptoms less severe.  You Must read complete instructions/literature along with all the possible adverse reactions/side effects for all the Medicines you take and that have been prescribed to you. Take any new Medicines after you have completely understood and accpet all the possible adverse reactions/side effects.   Increase activity slowly   Complete by: As directed       Discharge Medications   Allergies as of 10/10/2019      Reactions   Heparin Other (See Comments)   Thrombocytopenia. 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   fluticasone 50 MCG/ACT nasal spray Commonly known as: FLONASE Place 1 spray into both nostrils daily.   guaifenesin 100 MG/5ML syrup Commonly known as: ROBITUSSIN Take 200 mg by mouth 2 (two) times daily as needed for cough or congestion.   levothyroxine 75 MCG tablet Commonly known as: SYNTHROID Take 75 mcg by mouth daily before breakfast.   losartan 25 MG tablet Commonly known as: COZAAR Take 0.5 tablets (12.5 mg total) by mouth daily. What changed: how much to take   metoprolol succinate 100 MG 24 hr tablet Commonly known as: TOPROL-XL Take 1 tablet (100 mg total) by mouth daily. What changed: when to take this   mexiletine 200 MG capsule Commonly known as: MEXITIL TAKE 1 CAPSULE BY MOUTH TWICE A DAY   multivitamin with minerals Tabs tablet Take 1 tablet by mouth daily. Centrum Silver   pantoprazole 40 MG tablet Commonly known as: PROTONIX TAKE 1 TABLET BY MOUTH EVERY DAY   potassium chloride SA 20 MEQ tablet Commonly known as: Klor-Con M20 TAKE 1 TABLET BY MOUTH TWICE A DAY What changed:   how much to take  how to take this  when to take this  additional instructions   spironolactone 25 MG tablet Commonly known as: ALDACTONE TAKE 1 TABLET BY MOUTH EVERY DAY   warfarin 5 MG tablet Commonly known as: COUMADIN Take as directed. If you are unsure how to take this medication, talk to your nurse or doctor. Original instructions: Take 1 to 1.5 tablets by mouth daily as directed by the coumadin clinic What changed:   how much to take  how to take this  when to take this  additional instructions        Follow-up Information    Glenis Smoker, MD. Schedule an appointment as soon as possible for a visit in 1 week(s).   Specialty: Family Medicine Contact information: [place] [address] [phone]         Constance Haw, MD. Schedule an appointment as soon as possible for a visit in 1 week(s).   Specialty: Cardiology Contact information: [place] [address] [phone]               Major procedures and Radiology Reports - PLEASE review detailed and final reports thoroughly  -       DG Chest Port 1 View  Result Date: 10/06/2019 CLINICAL DATA:  Shortness of breath.  Cough. EXAM: PORTABLE CHEST 1 VIEW COMPARISON:  12/02/2015 FINDINGS: Stable position of the left cardiac ICD. Heart size is within normal limits. Surgical plate in the lower cervical spine. Small new densities at the left lung base. Otherwise, the lungs are clear. Negative for a pneumothorax. IMPRESSION: Small densities at the left lung base are nonspecific. Findings could represent atelectasis versus small focus of infection. Electronically Signed   By: Markus Daft M.D.   On: 10/06/2019 14:40   ECHOCARDIOGRAM COMPLETE  Result Date: 10/08/2019    ECHOCARDIOGRAM LIMITED REPORT   Patient Name:   Arthur Chase Date of Exam: 10/08/2019 Medical Rec #:  [id number]        Height:  71.0 in Accession #:    [id number]       Weight:       217.0 lb Date of Birth:  10-22-1955         BSA:          2.183 m Patient Age:    64 years         BP:           97/70 mmHg Patient Gender: M                HR:           78 bpm. Exam Location:  Inpatient Procedure: Limited Echo, Cardiac Doppler and Color Doppler Indications:    CHF-Acute Diastolic 314.97 / W26.37  History:        Patient has prior history of Echocardiogram examinations, most                 recent 11/15/2018. Defibrillator, Arrythmias:Atrial Fibrillation;                 Risk Factors:Hypertension. NICM. COVID-19.  Sonographer:    Clayton Lefort RDCS (AE) Referring Phys: [id number] Margaree Mackintosh [facility]  Sonographer Comments: Suboptimal parasternal window. COVID-19. IMPRESSIONS  1. Left ventricular ejection fraction, by estimation, is 55 to 60%. The left ventricle has  normal function. The left ventricle has no regional wall motion abnormalities. Left ventricular diastolic function could not be evaluated.  2. Right ventricular systolic function is normal. The right ventricular size is normal. There is normal pulmonary artery systolic pressure.  3. The mitral valve is abnormal. No evidence of mitral stenosis.  4. The aortic valve is tricuspid. Aortic valve regurgitation is not visualized. No aortic stenosis is present.  5. The inferior vena cava is normal in size with greater than 50% respiratory variability, suggesting right atrial pressure of 3 mmHg. Comparison(s): No significant change from prior study. Conclusion(s)/Recommendation(s): Normal biventricular function without evidence of hemodynamically significant valvular heart disease. FINDINGS  Left Ventricle: Left ventricular ejection fraction, by estimation, is 55 to 60%. The left ventricle has normal function. The left ventricle has no regional wall motion abnormalities. The left ventricular internal cavity size was normal in size. Left ventricular diastolic function could not be evaluated due to atrial fibrillation. Right Ventricle: The right ventricular size is normal. Right ventricular systolic function is normal. There is normal pulmonary artery systolic pressure. The tricuspid regurgitant velocity is 2.31 m/s, and with an assumed right atrial pressure of 3 mmHg,  the estimated right ventricular systolic pressure is 85.8 mmHg. Left Atrium: Left atrial size was not assessed. Right Atrium: Right atrial size was not assessed. Pericardium: There is no evidence of pericardial effusion. Presence of pericardial fat pad. Mitral Valve: The mitral valve is abnormal. 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 No evidence of mitral valve stenosis. Tricuspid Valve: The tricuspid valve is normal in structure. Tricuspid valve regurgitation is trivial. No evidence of tricuspid stenosis. Aortic  Valve: The aortic valve is tricuspid. Aortic valve regurgitation is not visualized. No aortic stenosis is present. Aortic valve mean gradient measures 3.0 mmHg. Aortic valve peak gradient measures 4.2 mmHg. Aortic valve area, by VTI measures 2.53 cm. Pulmonic Valve: The pulmonic valve was not well visualized. Aorta: The aortic root is normal in size and structure. Venous: The inferior vena cava is normal in size with greater than 50% respiratory variability, suggesting right atrial pressure of 3 mmHg. IAS/Shunts: The atrial septum is grossly normal.  Additional Comments: A pacer wire is visualized in the right atrium and right ventricle. LEFT VENTRICLE PLAX 2D LVOT diam:     2.10 cm LV SV:         48 LV SV Index:   22 LVOT Area:     3.46 cm  IVC IVC diam: 1.60 cm AORTIC VALVE AV Area (Vmax):    2.81 cm AV Area (Vmean):   2.18 cm AV Area (VTI):     2.53 cm AV Vmax:           103.00 cm/s AV Vmean:          77.100 cm/s AV VTI:            0.192 m AV Peak Grad:      4.2 mmHg AV Mean Grad:      3.0 mmHg LVOT Vmax:         83.60 cm/s LVOT Vmean:        48.600 cm/s LVOT VTI:          0.140 m LVOT/AV VTI ratio: 0.73 TRICUSPID VALVE TR Peak grad:   21.3 mmHg TR Vmax:        231.00 cm/s  SHUNTS Systemic VTI:  0.14 m Systemic Diam: 2.10 cm Buford Dresser MD Electronically signed by Buford Dresser MD Signature Date/Time: 10/08/2019/11:52:25 AM    Final    XR KNEE 3 VIEW RIGHT  Result Date: 10/03/2019 AP lateral bilateral sunrise patellar x-rays obtained right knee.  This shows slight narrowing left knee medial joint line.  Left knee shows no significant narrowing no degenerative or acute changes. Impression: Normal right knee radiographs.  Slight left knee medial joint line narrowing.   Micro Results     Recent Results (from the past 240 hour(s))  Blood Culture (routine x 2)     Status: None (Preliminary result)   Collection Time: 10/06/19  1:40 PM   Specimen: BLOOD  Result Value Ref Range Status    Specimen Description BLOOD RIGHT ANTECUBITAL  Final   Special Requests   Final    BOTTLES DRAWN AEROBIC AND ANAEROBIC Blood Culture adequate volume   Culture   Final    NO GROWTH 4 DAYS Performed at [facility], [address] [place], [place] [address]    Report Status PENDING  Incomplete  SARS Coronavirus 2 by RT PCR (hospital order, performed in [facility] hospital lab) Nasopharyngeal Nasopharyngeal Swab     Status: Abnormal   Collection Time: 10/06/19  2:03 PM   Specimen: Nasopharyngeal Swab  Result Value Ref Range Status   SARS Coronavirus 2 POSITIVE (A) NEGATIVE Final    Comment: RESULT CALLED TO, READ BACK BY AND VERIFIED WITH: RN A COSTALES [id number] MLM (NOTE) SARS-CoV-2 target nucleic acids are DETECTED  SARS-CoV-2 RNA is generally detectable in upper respiratory specimens  during the acute phase of infection.  Positive results are indicative  of the presence of the identified virus, but do not rule out bacterial infection or co-infection with other pathogens not detected by the test.  Clinical correlation with patient history and  other diagnostic information is necessary to determine patient infection status.  The expected result is negative.  Fact Sheet for Patients:   StrictlyIdeas.no   Fact Sheet for Healthcare Providers:   BankingDealers.co.za    This test is not yet approved or cleared by the [place] FDA and  has been authorized for detection and/or diagnosis of SARS-CoV-2 by FDA under an Emergency Use Authorization (  EUA).  This EUA will remain in effect (meaning this test ca n be used) for the duration of  the COVID-19 declaration under Section 564(b)(1) of the Act, 21 U.S.C. section 360-bbb-3(b)(1), unless the authorization is terminated or revoked sooner.  Performed at [facility], [place] [address] [place], [place] [address]   Blood Culture (routine x 2)     Status: None (Preliminary  result)   Collection Time: 10/06/19  3:24 PM   Specimen: BLOOD LEFT FOREARM  Result Value Ref Range Status   Specimen Description BLOOD LEFT FOREARM  Final   Special Requests   Final    BOTTLES DRAWN AEROBIC AND ANAEROBIC Blood Culture adequate volume   Culture   Final    NO GROWTH 4 DAYS Performed at [facility], [place] [address], [place], [place] [address]    Report Status PENDING  Incomplete  MRSA PCR Screening     Status: Abnormal   Collection Time: 10/06/19 11:34 PM   Specimen: Nasal Mucosa; Nasopharyngeal  Result Value Ref Range Status   MRSA by PCR POSITIVE (A) NEGATIVE Final    Comment:        The GeneXpert MRSA Assay (FDA approved for NASAL specimens only), is one component of a comprehensive MRSA colonization surveillance program. It is not intended to diagnose MRSA infection nor to guide or monitor treatment for MRSA infections. RESULT CALLED TO, READ BACK BY AND VERIFIED WITH: GAMBLE,B RN 0103 10/07/2019 MITCHELL,L Performed at [facility], [place] [address] [place], [place] [address]     Today   Subjective    Shown Dissinger today has no headache,no chest abdominal pain,no new weakness tingling or numbness, feels much better wants to go home today    Objective   Blood pressure 100/73, pulse 74, temperature 97.9 F (36.6 C), temperature source Oral, resp. rate 16, height _0  (1.803 m), weight 98.4 kg, SpO2 95 %.   Intake/Output Summary (Last 24 hours) at 10/10/2019 1105 Last data filed at 10/09/2019 2323 Gross per 24 hour  Intake 1176 ml  Output 200 ml  Net 976 ml    Exam  Awake Alert, No new F.N deficits, Normal affect [place].AT,PERRAL Supple Neck,No JVD, No cervical lymphadenopathy appriciated.  Symmetrical Chest wall movement, Good air movement bilaterally, CTAB RRR,No Gallops,Rubs or new Murmurs, No Parasternal Heave +ve B.Sounds, Abd Soft, Non tender, No organomegaly appriciated, No rebound -guarding or rigidity. No Cyanosis,  Clubbing or edema, No new Rash or bruise   Data Review   CBC w Diff:  Lab Results  Component Value Date   WBC 10.7 (H) 10/10/2019   HGB 13.7 10/10/2019   HCT 41.5 10/10/2019   PLT 322 10/10/2019   LYMPHOPCT 15 10/10/2019   MONOPCT 4 10/10/2019   EOSPCT 0 10/10/2019   BASOPCT 0 10/10/2019    CMP:  Lab Results  Component Value Date   NA 139 10/10/2019   NA 142 11/15/2018   K 4.3 10/10/2019   CL 106 10/10/2019   CO2 25 10/10/2019   BUN 18 10/10/2019   BUN 13 11/15/2018   CREATININE 0.73 10/10/2019   CREATININE 0.84 12/09/2015   PROT 5.9 (L) 10/10/2019   PROT 7.1 10/20/2017   ALBUMIN 2.8 (L) 10/10/2019   ALBUMIN 4.7 10/20/2017   BILITOT 0.9 10/10/2019   BILITOT 0.3 10/20/2017   ALKPHOS 53 10/10/2019   AST 21 10/10/2019   ALT 24 10/10/2019  . Lab Results  Component Value Date   TSH 1.851  10/07/2019     Total Time in preparing paper work, data evaluation and todays exam - 33 minutes  Lala Lund M.D on 10/10/2019 at 11:05 AM  [facility]   Office  [phone]

## 2019-10-11 LAB — CULTURE, BLOOD (ROUTINE X 2)
Culture: NO GROWTH
Culture: NO GROWTH
Special Requests: ADEQUATE
Special Requests: ADEQUATE

## 2019-10-24 ENCOUNTER — Other Ambulatory Visit: Payer: Self-pay

## 2019-10-24 ENCOUNTER — Ambulatory Visit (HOSPITAL_COMMUNITY)
Admission: RE | Admit: 2019-10-24 | Discharge: 2019-10-24 | Disposition: A | Discharge status: Home / Self Care | Payer: No Typology Code available for payment source | Source: Ambulatory Visit | Attending: Physician Assistant | Admitting: Physician Assistant

## 2019-10-24 ENCOUNTER — Ambulatory Visit (INDEPENDENT_AMBULATORY_CARE_PROVIDER_SITE_OTHER): Payer: No Typology Code available for payment source | Admitting: Orthopaedic Surgery

## 2019-10-24 ENCOUNTER — Encounter: Payer: Self-pay | Admitting: Orthopaedic Surgery

## 2019-10-24 ENCOUNTER — Ambulatory Visit (INDEPENDENT_AMBULATORY_CARE_PROVIDER_SITE_OTHER): Payer: No Typology Code available for payment source

## 2019-10-24 VITALS — BP 110/76 | HR 67 | Ht 71.0 in | Wt 222.4 lb

## 2019-10-24 DIAGNOSIS — M25561 Pain in right knee: Secondary | ICD-10-CM | POA: Diagnosis not present

## 2019-10-24 DIAGNOSIS — I110 Hypertensive heart disease with heart failure: Secondary | ICD-10-CM | POA: Insufficient documentation

## 2019-10-24 DIAGNOSIS — I480 Paroxysmal atrial fibrillation: Secondary | ICD-10-CM | POA: Insufficient documentation

## 2019-10-24 DIAGNOSIS — Z87891 Personal history of nicotine dependence: Secondary | ICD-10-CM | POA: Insufficient documentation

## 2019-10-24 DIAGNOSIS — E669 Obesity, unspecified: Secondary | ICD-10-CM | POA: Diagnosis not present

## 2019-10-24 DIAGNOSIS — Z8674 Personal history of sudden cardiac arrest: Secondary | ICD-10-CM | POA: Diagnosis not present

## 2019-10-24 DIAGNOSIS — Z6831 Body mass index (BMI) 31.0-31.9, adult: Secondary | ICD-10-CM | POA: Diagnosis not present

## 2019-10-24 DIAGNOSIS — Z79899 Other long term (current) drug therapy: Secondary | ICD-10-CM | POA: Diagnosis not present

## 2019-10-24 DIAGNOSIS — I5021 Acute systolic (congestive) heart failure: Secondary | ICD-10-CM | POA: Diagnosis not present

## 2019-10-24 DIAGNOSIS — Z7901 Long term (current) use of anticoagulants: Secondary | ICD-10-CM | POA: Diagnosis not present

## 2019-10-24 DIAGNOSIS — D7582 Heparin induced thrombocytopenia (HIT): Secondary | ICD-10-CM

## 2019-10-24 DIAGNOSIS — I4891 Unspecified atrial fibrillation: Secondary | ICD-10-CM

## 2019-10-24 DIAGNOSIS — I428 Other cardiomyopathies: Secondary | ICD-10-CM | POA: Diagnosis not present

## 2019-10-24 DIAGNOSIS — G8929 Other chronic pain: Secondary | ICD-10-CM | POA: Diagnosis not present

## 2019-10-24 DIAGNOSIS — D75829 Heparin-induced thrombocytopenia, unspecified: Secondary | ICD-10-CM

## 2019-10-24 DIAGNOSIS — Z5181 Encounter for therapeutic drug level monitoring: Secondary | ICD-10-CM | POA: Diagnosis not present

## 2019-10-24 DIAGNOSIS — D6869 Other thrombophilia: Secondary | ICD-10-CM | POA: Diagnosis not present

## 2019-10-24 LAB — POCT INR: INR: 2.4 (ref 2.0–3.0)

## 2019-10-24 NOTE — Patient Instructions (Signed)
Description   Continue alternating 7.5mg  and 5mg  of Warfarin every other day. Recheck INR in 2 weeks. Pt will be out of town 8/11-8/18.  Call coumadin clinic [phone]

## 2019-10-24 NOTE — Progress Notes (Signed)
Primary Care Physician: Shon Hale, MD Primary Cardiologist: Dr Excell Seltzer Primary Electrophysiologist: Dr Elberta Fortis Referring Physician: Dr Mattie Marlin is a 64 y.o. 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 He was admitted 11/16/15 with cardiac arrest and EMS strips demonstrating multiple episodes of VT/VF with a cycle length ~247msec. He was successfully rescuscitated - underwent a prolonged hospital admission s/p cooling. Underwent ICD implantation with a Medtronic ICD on 12/01/15. Was put on amiodarone. Had received ICD shocks in the past.  He was thus started on mexiletine. Patient is on warfarin for a CHADS2VASC score of 2. He was hospitalized on 10/06/19 with COVID pneumonia and developed afib with RVR during admission. He is back in SR today. He states he "feels great."   Today, he denies symptoms of palpitations, chest pain, shortness of breath, orthopnea, PND, lower extremity edema, dizziness, presyncope, syncope, snoring, daytime somnolence, bleeding, or neurologic sequela. The patient is tolerating medications without difficulties and is otherwise without complaint today.    Atrial Fibrillation Risk Factors:  he does not have symptoms or diagnosis of sleep apnea. he does not have a history of rheumatic fever.   he has a BMI of Body mass index is 31.02 kg/m.Marland Kitchen Filed Weights   10/24/19 1413  Weight: 100.9 kg    Family History  Problem Relation Age of Onset  . Heart attack Mother   . Alzheimer's disease Father      Atrial Fibrillation Management history:  Previous antiarrhythmic drugs:  amiodarone, mexiletine  Previous cardioversions: none Previous ablations: none CHADS2VASC score: 2 Anticoagulation history: warfarin    Past Medical History:  Diagnosis Date  . Anoxic encephalopathy (HCC) 10/2015   a. 10/2015 in setting of cardiac arrest - recovered prior to DC.  . Cardiac arrest with ventricular fibrillation (HCC)    a. 11/2015: Unclear etiology. Nl cors on cath 8/27. S/P cooling. On amiodarone and s/p ICD for secondary prevention on 12/01/15. EF down to 25-30% but normalized to 68 by subsequent cardiac MR  . COVID-19 10/06/2019  . HIT (heparin-induced thrombocytopenia) (HCC)    a. 11/2015 during admission for VF arrest. HIT panel positive. Placed on coumadin for 2 months  . HTN (hypertension)   . Hypokalemia    a. 11/2015: resolved on K supplementation and spironolactone  . ICD (implantable cardioverter-defibrillator) in place    a. Medtronic Visia AF MRI (serial  Number [id number]) ICD.  Marland Kitchen Myocardial stunning    a. 11/2015: EF down to 20-25% after VF arrest but improved to ~68 by subsequent cardiac MR.  Marland Kitchen NICM (nonischemic cardiomyopathy) (HCC)   . Obesity   . PAF (paroxysmal atrial fibrillation) (HCC)    a. 11/2015 brief run of afib with RVR while intubated during a complicated admission for VF arrest and VDRF. No recurrence on amio. CHADSVASC 2 (CHF, HTN) and on coumadin for HIT with positive thrombotic markers. If no long term recurrence, he may be able to come off OAC  . Ventricular tachycardia (HCC) 10/2015   Past Surgical History:  Procedure Laterality Date  . CARDIAC CATHETERIZATION N/A 11/16/2015   Procedure: Left Heart Cath and Coronary Angiography;  Surgeon: Peter M Swaziland, MD;  Location: [facility] INVASIVE CV LAB;  Service: Cardiovascular;  Laterality: N/A;  . EP IMPLANTABLE DEVICE N/A 12/01/2015   Procedure: ICD Implant;  Surgeon: Will [name], MD;  Location: [facility] INVASIVE CV LAB;  Service: Cardiovascular;  Laterality: N/A;    Current Outpatient Medications    Medication Sig Dispense Refill  . albuterol (VENTOLIN HFA) 108 (90 Base) MCG/ACT inhaler Inhale 2 puffs into the lungs every 6 (six) hours as needed for wheezing or shortness of breath. 6.7 g 0  . fluticasone (FLONASE) 50 MCG/ACT nasal spray Place 1 spray into both nostrils daily.    Marland Kitchen levothyroxine (SYNTHROID) 75 MCG tablet Take 75 mcg by mouth daily before breakfast.     . losartan (COZAAR) 25 MG tablet Take 0.5 tablets (12.5 mg total) by mouth daily. 90 tablet 0  . metoprolol succinate (TOPROL-XL) 100 MG 24 hr tablet Take 1 tablet (100 mg total) by mouth daily. (Patient taking differently: Take 100 mg by mouth daily after supper. ) 90 tablet 3  . mexiletine (MEXITIL) 200 MG capsule TAKE 1 CAPSULE BY MOUTH TWICE A DAY (Patient taking differently: Take 200 mg by mouth 2 (two) times daily. ) 180 capsule 2  . Multiple Vitamin (MULTIVITAMIN WITH MINERALS) TABS tablet Take 1 tablet by mouth daily. Centrum Silver    . pantoprazole (PROTONIX) 40 MG tablet TAKE 1 TABLET BY MOUTH EVERY DAY (Patient taking differently: Take 40 mg by mouth daily. ) 90 tablet 3  . potassium chloride SA (KLOR-CON M20) 20 MEQ tablet TAKE 1 TABLET BY MOUTH TWICE A DAY (Patient taking differently: Take 20 mEq by mouth 2 (two) times daily. ) 180 tablet 2  . spironolactone (ALDACTONE) 25 MG tablet TAKE 1 TABLET BY MOUTH EVERY DAY (Patient taking differently: Take 25 mg by mouth daily. ) 90 tablet 2  . warfarin (COUMADIN) 5 MG tablet Take 1 to 1.5 tablets by mouth daily as directed by the coumadin clinic (Patient taking differently: Take 5-7.5 mg by mouth See admin instructions. Take 1 tablet (5 mg) by mouth on Saturday mornings, take 1 1/2 tablets (7.5 mg) on all other mornings of the week - or as directed by the coumadin clinic) 45 tablet 2   No current facility-administered medications for this encounter.    Allergies  Allergen Reactions  . Heparin Other (See Comments)    Thrombocytopenia. HIT Ab positive 11/23/15 and SRA  positive 11/25/15    Social History   Socioeconomic History  . Marital status: Married    Spouse name: Not on file  . Number of children: Not on file  . Years of education: Not on file  . Highest education level: Not on file  Occupational History  . Occupation: Engineer, site  Tobacco Use  . Smoking status: Former Smoker    Types: Cigarettes    Quit date: 1995    Years since quitting: 26.6  . Smokeless tobacco: Never Used  Vaping Use  . Vaping Use: Never used  Substance and Sexual Activity  . Alcohol use: No    Comment: quit at age 4  . Drug use: No  . Sexual activity: Not on file  Other Topics Concern  . Not on file  Social History Narrative  . Not on file   Social Determinants of Health   Financial Resource Strain:   . Difficulty of Paying Living Expenses:   Food Insecurity:   . Worried About Programme researcher, broadcasting/film/video in the Last Year:   . Barista in  the Last Year:   Transportation Needs:   . Freight forwarder (Medical):   Marland Kitchen Lack of Transportation (Non-Medical):   Physical Activity:   . Days of Exercise per Week:   . Minutes of Exercise per Session:   Stress:   . Feeling of Stress :   Social Connections:   . Frequency of Communication with Friends and Family:   . Frequency of Social Gatherings with Friends and Family:   . Attends Religious Services:   . Active Member of Clubs or Organizations:   . Attends Banker Meetings:   Marland Kitchen Marital Status:   Intimate Partner Violence:   . Fear of Current or Ex-Partner:   . Emotionally Abused:   Marland Kitchen Physically Abused:   . Sexually Abused:      ROS- All systems are reviewed and negative except as per the HPI above.  Physical Exam: Vitals:   10/24/19 1413  BP: 110/76  Pulse: 67  Weight: 100.9 kg  Height: 5\' 11"  (1.803 m)    GEN- The patient is well appearing obese male, alert and oriented x 3 today.   Head- normocephalic, atraumatic Eyes-  Sclera clear, conjunctiva pink Ears- hearing  intact Oropharynx- clear Neck- supple  Lungs- Clear to ausculation bilaterally, normal work of breathing Heart- Regular rate and rhythm, no murmurs, rubs or gallops  GI- soft, NT, ND, + BS Extremities- no clubbing, cyanosis, or edema MS- no significant deformity or atrophy Skin- no rash or lesion Psych- euthymic mood, full affect Neuro- strength and sensation are intact  Wt Readings from Last 3 Encounters:  10/24/19 100.9 kg  10/24/19 98.4 kg  10/06/19 98.4 kg    EKG today demonstrates SR HR 67, LAD, PR 192, QRS 114, QTc 424  Echo 10/08/19 demonstrated  1. Left ventricular ejection fraction, by estimation, is 55 to 60%. The  left ventricle has normal function. The left ventricle has no regional  wall motion abnormalities. Left ventricular diastolic function could not  be evaluated.  2. Right ventricular systolic function is normal. The right ventricular  size is normal. There is normal pulmonary artery systolic pressure.  3. The mitral valve is abnormal. No evidence of mitral stenosis.  4. The aortic valve is tricuspid. Aortic valve regurgitation is not  visualized. No aortic stenosis is present.  5. The inferior vena cava is normal in size with greater than 50%  respiratory variability, suggesting right atrial pressure of 3 mmHg.   Comparison(s): No significant change from prior study.   Conclusion(s)/Recommendation(s): Normal biventricular function without  evidence of hemodynamically significant valvular heart disease.   Epic records are reviewed at length today  CHA2DS2-VASc Score = 2  The patient's score is based upon: CHF History: 1 HTN History: 1 Age : 0 Diabetes History: 0 Stroke History: 0 Vascular Disease History: 0 Gender: 0      ASSESSMENT AND PLAN: 1. Paroxysmal Atrial Fibrillation (ICD10:  I48.0) The patient's CHA2DS2-VASc score is 2, indicating a 2.2% annual risk of stroke.   Suspect 2/2 acute COVID infection.  He is back in SR. Overall  burden on device prior to hospitalization very low. Continue warfarin Continue Toprol 100 mg daily  2. Secondary Hypercoagulable State (ICD10:  D68.69) The patient is at significant risk for stroke/thromboembolism based upon his CHA2DS2-VASc Score of 2.  Continue Warfarin (Coumadin).   3. Obesity Body mass index is 31.02 kg/m. Lifestyle modification was discussed at length including regular exercise and weight reduction.  4. HTN Stable, no changes today.  5. VF cardiac arrest S/p ICD, on mexiletine.  Followed by Dr Elberta Fortis.  6. NICM No signs or symptoms of fluid overload.   Follow up with Dr Excell Seltzer as scheduled and Dr Elberta Fortis per recall.   [name] PA-C [facility] [address] [place], [place] [address] [phone] 10/24/2019 2:54 PM

## 2019-10-24 NOTE — Progress Notes (Signed)
Office Visit Note   Patient: Arthur Chase           Date of Birth: 1956/02/28           MRN: [id number] Visit Date: 10/24/2019              Requested by: Shon Hale, MD [address] [place],  [place] [address] PCP: Shon Hale, MD   Assessment & Plan: Visit Diagnoses:  1. Chronic pain of right knee     Plan: I plan to check him back again in a month.  He did get 50% pain relief with the injection.  If he develops true locking symptoms he will call and let us know earlier.  He can continue topical Aspercreme.  Follow-Up Instructions: Return in about 1 month (around 11/24/2019).   Orders:  No orders of the defined types were placed in this encounter.  No orders of the defined types were placed in this encounter.     Procedures: No procedures performed   Clinical Data: No additional findings.   Subjective: Chief Complaint  Patient presents with   Right Knee - Follow-up    HPI 64 year old male returns with ongoing chronic pain in his right knee.  Knee injection 10/03/2019 gave him about 50% relief.  He continues to have trouble going up and down steps because 1 foot at a time.  He states he knee feels somewhat loose with pulling sensation but has not given him true locking.  He is on Coumadin chronically due to atrial fib and had rapid ventricular rate with cardiac arrest.  He also had heparin-induced thrombocytopenia so he has extensive cardiac history.  Patient's been able to ambulate and does state for percent pain relief has been an improvement for him.  Previous x-rays did not show significant arthritic changes in his knee and were normal. Patient has a pacemaker defibrillator and cannot have MRI scan. Review of Systems 14 point update unchanged from 10/03/2019 office visit.  Objective: Vital Signs: BP 116/73 (BP Location: Left Arm, Patient Position: Sitting, Cuff Size: Normal)    Pulse 67    Ht 5\' 11"  (1.803 m)    Wt 217 lb (98.4 kg)     BMI 30.27 kg/m   Physical Exam Constitutional:      Appearance: He is well-developed.  HENT:     Head: Normocephalic and atraumatic.  Eyes:     Pupils: Pupils are equal, round, and reactive to light.  Neck:     Thyroid: No thyromegaly.     Trachea: No tracheal deviation.  Cardiovascular:     Rate and Rhythm: Normal rate.  Pulmonary:     Effort: Pulmonary effort is normal.     Breath sounds: No wheezing.  Abdominal:     General: Bowel sounds are normal.     Palpations: Abdomen is soft.  Skin:    General: Skin is warm and dry.     Capillary Refill: Capillary refill takes less than 2 seconds.  Neurological:     Mental Status: He is alert and oriented to person, place, and time.  Psychiatric:        Behavior: Behavior normal.        Thought Content: Thought content normal.        Judgment: Judgment normal.     Ortho Exam lateral ligaments are stable.  He has some medial and some lateral joint line tenderness no knee effusion.  ACL PCL is normal full extension good flexion.  Negative logroll of the hips no sciatic notch tenderness no trochanteric bursal tenderness.  No palpable Baker's cyst.  Specialty Comments:  No specialty comments available.  Imaging: No results found.   PMFS History: Patient Active Problem List   Diagnosis Date Noted   Pain in right knee 10/29/2019   Secondary hypercoagulable state (HCC) 10/24/2019   Acute hypoxemic respiratory failure due to COVID-19 [facility]) 10/06/2019   Obesity    NICM (nonischemic cardiomyopathy) (HCC)    HIT (heparin-induced thrombocytopenia) (HCC)    Cardiac arrest with ventricular fibrillation (HCC)    HTN (hypertension)    ICD (implantable cardioverter-defibrillator) in place    Paroxysmal atrial fibrillation (HCC)    Acute systolic CHF (congestive heart failure) (HCC) 11/25/2015   Hypokalemia 11/25/2015   NSVT (nonsustained ventricular tachycardia) (HCC) 11/25/2015   Atrial fibrillation with rapid  ventricular response (HCC) 11/20/2015   Cardiogenic shock (HCC)    Acute respiratory failure with hypoxemia (HCC) 11/16/2015   Anoxic encephalopathy (HCC) 10/21/2015   Past Medical History:  Diagnosis Date   Anoxic encephalopathy (HCC) 10/2015   a. 10/2015 in setting of cardiac arrest - recovered prior to DC.   Cardiac arrest with ventricular fibrillation (HCC)    a. 11/2015: Unclear etiology. Nl cors on cath 8/27. S/P cooling. On amiodarone and s/p ICD for secondary prevention on 12/01/15. EF down to 25-30% but normalized to 68 by subsequent cardiac MR   COVID-19 10/06/2019   HIT (heparin-induced thrombocytopenia) (HCC)    a. 11/2015 during admission for VF arrest. HIT panel positive. Placed on coumadin for 2 months   HTN (hypertension)    Hypokalemia    a. 11/2015: resolved on K supplementation and spironolactone   ICD (implantable cardioverter-defibrillator) in place    a. Medtronic Visia AF MRI (serial  Number [id number]) ICD.   Myocardial stunning    a. 11/2015: EF down to 20-25% after VF arrest but improved to ~68 by subsequent cardiac MR.   NICM (nonischemic cardiomyopathy) (HCC)    Obesity    PAF (paroxysmal atrial fibrillation) (HCC)    a. 11/2015 brief run of afib with RVR while intubated during a complicated admission for VF arrest and VDRF. No recurrence on amio. CHADSVASC 2 (CHF, HTN) and on coumadin for HIT with positive thrombotic markers. If no long term recurrence, he may be able to come off [facility]   Ventricular tachycardia (HCC) 10/2015    Family History  Problem Relation Age of Onset   Heart attack Mother    Alzheimer's disease Father     Past Surgical History:  Procedure Laterality Date   CARDIAC CATHETERIZATION N/A 11/16/2015   Procedure: Left Heart Cath and Coronary Angiography;  Surgeon: Peter M Swaziland, MD;  Location[facility] INVASIVE CV LAB;  Service: Cardiovascular;  Laterality: N/A;   EP IMPLANTABLE DEVICE N/A 12/01/2015   Procedure: ICD Implant;   Surgeon: Will [name], MD;  Location: [facility] INVASIVE CV LAB;  Service: Cardiovascular;  Laterality: N/A;   Social History   Occupational History   Occupation: Engineer, site  Tobacco Use   Smoking status: Former Smoker    Types: Cigarettes    Quit date: 1995    Years since quitting: 26.6   Smokeless tobacco: Never Used  Building services engineer Use: Never used  Substance and Sexual Activity   Alcohol use: No    Comment: quit at age 11   Drug use: No   Sexual activity: Not on file

## 2019-10-29 DIAGNOSIS — M25561 Pain in right knee: Secondary | ICD-10-CM | POA: Insufficient documentation

## 2019-11-02 ENCOUNTER — Other Ambulatory Visit: Payer: Self-pay | Admitting: Cardiology

## 2019-11-07 ENCOUNTER — Ambulatory Visit (INDEPENDENT_AMBULATORY_CARE_PROVIDER_SITE_OTHER): Payer: No Typology Code available for payment source | Admitting: *Deleted

## 2019-11-07 DIAGNOSIS — I472 Ventricular tachycardia, unspecified: Secondary | ICD-10-CM

## 2019-11-08 ENCOUNTER — Ambulatory Visit (INDEPENDENT_AMBULATORY_CARE_PROVIDER_SITE_OTHER): Payer: No Typology Code available for payment source | Admitting: *Deleted

## 2019-11-08 ENCOUNTER — Other Ambulatory Visit: Payer: Self-pay

## 2019-11-08 ENCOUNTER — Telehealth: Payer: Self-pay | Admitting: *Deleted

## 2019-11-08 DIAGNOSIS — D7582 Heparin induced thrombocytopenia (HIT): Secondary | ICD-10-CM | POA: Diagnosis not present

## 2019-11-08 DIAGNOSIS — D75829 Heparin-induced thrombocytopenia, unspecified: Secondary | ICD-10-CM

## 2019-11-08 DIAGNOSIS — I4891 Unspecified atrial fibrillation: Secondary | ICD-10-CM | POA: Diagnosis not present

## 2019-11-08 DIAGNOSIS — Z5181 Encounter for therapeutic drug level monitoring: Secondary | ICD-10-CM

## 2019-11-08 LAB — CUP PACEART REMOTE DEVICE CHECK
Battery Remaining Longevity: 98 mo
Battery Voltage: 3 V
Brady Statistic RV Percent Paced: 0.66 %
Date Time Interrogation Session: 20210819001607
HighPow Impedance: 84 Ohm
Implantable Lead Implant Date: 20170911
Implantable Lead Location: 753860
Implantable Pulse Generator Implant Date: 20170911
Lead Channel Impedance Value: 342 Ohm
Lead Channel Impedance Value: 437 Ohm
Lead Channel Pacing Threshold Amplitude: 0.625 V
Lead Channel Pacing Threshold Pulse Width: 0.4 ms
Lead Channel Sensing Intrinsic Amplitude: 9.875 mV
Lead Channel Sensing Intrinsic Amplitude: 9.875 mV
Lead Channel Setting Pacing Amplitude: 2.5 V
Lead Channel Setting Pacing Pulse Width: 0.4 ms
Lead Channel Setting Sensing Sensitivity: 0.3 mV

## 2019-11-08 LAB — POCT INR: INR: 2 (ref 2.0–3.0)

## 2019-11-08 NOTE — Patient Instructions (Signed)
Description   Continue alternating 7.5mg  and 5mg  of Warfarin every other day. Recheck INR in 3 weeks.  Call coumadin clinic [phone]

## 2019-11-08 NOTE — Telephone Encounter (Signed)
Spoke with pt regarding possible fluid accumulation since 10/23/19 noted on scheduled ICD transmission. Pt denies any increased SOB, weight gain, orthopnea, or LE edema. Pt reports he had been doing well other than some "fluid on the knee" which is followed by Dr. Kevan Ny. Pt reports compliance with cardiac meds. Offered ICM Clinic enrollment, which pt declines at this time as he is asymptomatic. Advised will forward to Dr. Excell Seltzer as Lorain Childes in advance of upcoming appointment on 11/14/19. Pt verbalizes understanding and appreciation of call.

## 2019-11-09 NOTE — Progress Notes (Signed)
Remote ICD transmission.   

## 2019-11-14 ENCOUNTER — Ambulatory Visit (INDEPENDENT_AMBULATORY_CARE_PROVIDER_SITE_OTHER): Payer: No Typology Code available for payment source | Admitting: Cardiovascular Disease

## 2019-11-14 ENCOUNTER — Other Ambulatory Visit: Payer: Self-pay

## 2019-11-14 ENCOUNTER — Encounter: Payer: Self-pay | Admitting: Cardiovascular Disease

## 2019-11-14 VITALS — BP 102/60 | HR 71 | Ht 71.0 in | Wt 231.2 lb

## 2019-11-14 DIAGNOSIS — I472 Ventricular tachycardia, unspecified: Secondary | ICD-10-CM

## 2019-11-14 DIAGNOSIS — I480 Paroxysmal atrial fibrillation: Secondary | ICD-10-CM | POA: Diagnosis not present

## 2019-11-14 DIAGNOSIS — I428 Other cardiomyopathies: Secondary | ICD-10-CM

## 2019-11-14 NOTE — Progress Notes (Signed)
Cardiology Office Note:    Date:  11/14/2019   ID:  KIRAN CARLINE, DOB 1955/07/25, MRN [id number]  PCP:  Shon Hale, MD  [facility] Cardiologist:  No primary care provider on file.  [facility] Electrophysiologist:  Will [name], MD   Referring MD: Shon Hale, *   Chief Complaint  Patient presents with  . Atrial Fibrillation    History of Present Illness:    Arthur Chase is a 64 y.o. male with a hx of out of hospital cardiac arrest in 2017, nonischemic cardiomyopathy, and paroxysmal atrial fibrillation, presenting for follow-up evaluation today.  The patient underwent ICD implantation after his ventricular fibrillation arrest.  He is followed by Dr. Elberta Fortis and is treated with mexiletine. He had atrial fibrillation with RVR during hospitalization for Covid pneumonia in July 2021.  The patient is anticoagulated with warfarin.  He was hospitalized with Covid pneumonia in July 2021. He was discharged after 5 days and has had an uneventful recovery. He is here alone today. Today, he denies symptoms of palpitations, chest pain, shortness of breath, orthopnea, PND, lower extremity edema, dizziness, or syncope.   Past Medical History:  Diagnosis Date  . Anoxic encephalopathy (HCC) 10/2015   a. 10/2015 in setting of cardiac arrest - recovered prior to DC.  . Cardiac arrest with ventricular fibrillation (HCC)    a. 11/2015: Unclear etiology. Nl cors on cath 8/27. S/P cooling. On amiodarone and s/p ICD for secondary prevention on 12/01/15. EF down to 25-30% but normalized to 68 by subsequent cardiac MR  . COVID-19 10/06/2019  . HIT (heparin-induced thrombocytopenia) (HCC)    a. 11/2015 during admission for VF arrest. HIT panel positive. Placed on coumadin for 2 months  . HTN (hypertension)   . Hypokalemia    a. 11/2015: resolved on K supplementation and spironolactone  . ICD (implantable cardioverter-defibrillator) in place    a. Medtronic Visia AF MRI  (serial  Number [id number]) ICD.  Marland Kitchen Myocardial stunning    a. 11/2015: EF down to 20-25% after VF arrest but improved to ~68 by subsequent cardiac MR.  Marland Kitchen NICM (nonischemic cardiomyopathy) (HCC)   . Obesity   . PAF (paroxysmal atrial fibrillation) (HCC)    a. 11/2015 brief run of afib with RVR while intubated during a complicated admission for VF arrest and VDRF. No recurrence on amio. CHADSVASC 2 (CHF, HTN) and on coumadin for HIT with positive thrombotic markers. If no long term recurrence, he may be able to come off OAC  . Ventricular tachycardia (HCC) 10/2015    Past Surgical History:  Procedure Laterality Date  . CARDIAC CATHETERIZATION N/A 11/16/2015   Procedure: Left Heart Cath and Coronary Angiography;  Surgeon: Peter M Swaziland, MD;  Location: [facility] INVASIVE CV LAB;  Service: Cardiovascular;  Laterality: N/A;  . EP IMPLANTABLE DEVICE N/A 12/01/2015   Procedure: ICD Implant;  Surgeon: Will [name], MD;  Location: [facility] INVASIVE CV LAB;  Service: Cardiovascular;  Laterality: N/A;    Current Medications: Current Meds  Medication Sig  . albuterol (VENTOLIN HFA) 108 (90 Base) MCG/ACT inhaler Inhale 2 puffs into the lungs every 6 (six) hours as needed for wheezing or shortness of breath.  . fluticasone (FLONASE) 50 MCG/ACT nasal spray Place 1 spray into both nostrils daily.  Marland Kitchen levothyroxine (SYNTHROID) 75 MCG tablet Take 75 mcg by mouth daily before breakfast.   . losartan (COZAAR) 25 MG tablet Take 0.5 tablets (12.5 mg total) by mouth daily.  . metoprolol succinate (  TOPROL-XL) 100 MG 24 hr tablet Take 1 tablet (100 mg total) by mouth daily. (Patient taking differently: Take 100 mg by mouth daily after supper. )  . mexiletine (MEXITIL) 200 MG capsule TAKE 1 CAPSULE BY MOUTH TWICE A DAY (Patient taking differently: Take 200 mg by mouth 2 (two) times daily. )  . Multiple Vitamin (MULTIVITAMIN WITH MINERALS) TABS tablet Take 1 tablet by mouth daily. Centrum Silver  . pantoprazole (PROTONIX) 40  MG tablet TAKE 1 TABLET BY MOUTH EVERY DAY (Patient taking differently: Take 40 mg by mouth daily. )  . potassium chloride SA (KLOR-CON M20) 20 MEQ tablet TAKE 1 TABLET BY MOUTH TWICE A DAY (Patient taking differently: Take 20 mEq by mouth 2 (two) times daily. )  . spironolactone (ALDACTONE) 25 MG tablet TAKE 1 TABLET BY MOUTH EVERY DAY (Patient taking differently: Take 25 mg by mouth daily. )  . warfarin (COUMADIN) 5 MG tablet TAKE 1 TO 1 AND 1/2 TABLETS BY MOUTH EVERY DAY AS DIRECTED BY COUMADIN CLINIC     Allergies:   Heparin   Social History   Socioeconomic History  . Marital status: Married    Spouse name: Not on file  . Number of children: Not on file  . Years of education: Not on file  . Highest education level: Not on file  Occupational History  . Occupation: Engineer, site  Tobacco Use  . Smoking status: Former Smoker    Types: Cigarettes    Quit date: 1995    Years since quitting: 26.6  . Smokeless tobacco: Never Used  Vaping Use  . Vaping Use: Never used  Substance and Sexual Activity  . Alcohol use: No    Comment: quit at age 80  . Drug use: No  . Sexual activity: Not on file  Other Topics Concern  . Not on file  Social History Narrative  . Not on file   Social Determinants of Health   Financial Resource Strain:   . Difficulty of Paying Living Expenses: Not on file  Food Insecurity:   . Worried About Programme researcher, broadcasting/film/video in the Last Year: Not on file  . Ran Out of Food in the Last Year: Not on file  Transportation Needs:   . Lack of Transportation (Medical): Not on file  . Lack of Transportation (Non-Medical): Not on file  Physical Activity:   . Days of Exercise per Week: Not on file  . Minutes of Exercise per Session: Not on file  Stress:   . Feeling of Stress : Not on file  Social Connections:   . Frequency of Communication with Friends and Family: Not on file  . Frequency of Social Gatherings with Friends and Family: Not on file  . Attends  Religious Services: Not on file  . Active Member of Clubs or Organizations: Not on file  . Attends Banker Meetings: Not on file  . Marital Status: Not on file     Family History: The patient's family history includes Alzheimer's disease in his father; Heart attack in his mother.  ROS:   Please see the history of present illness.    All other systems reviewed and are negative.  EKGs/Labs/Other Studies Reviewed:    The following studies were reviewed today: Echo 10-08-2019: IMPRESSIONS    1. Left ventricular ejection fraction, by estimation, is 55 to 60%. The  left ventricle has normal function. The left ventricle has no regional  wall motion abnormalities. Left ventricular diastolic function could not  be evaluated.  2. Right ventricular systolic function is normal. The right ventricular  size is normal. There is normal pulmonary artery systolic pressure.  3. The mitral valve is abnormal. No evidence of mitral stenosis.  4. The aortic valve is tricuspid. Aortic valve regurgitation is not  visualized. No aortic stenosis is present.  5. The inferior vena cava is normal in size with greater than 50%  respiratory variability, suggesting right atrial pressure of 3 mmHg.   Comparison(s): No significant change from prior study.   Conclusion(s)/Recommendation(s): Normal biventricular function without  evidence of hemodynamically significant valvular heart disease.   EKG:  EKG is not ordered today.   Recent Labs: 10/07/2019: TSH 1.851 10/10/2019: ALT 24; B Natriuretic Peptide 140.1; BUN 18; Creatinine, Ser 0.73; Hemoglobin 13.7; Magnesium 2.3; Platelets 322; Potassium 4.3; Sodium 139  Recent Lipid Panel    Component Value Date/Time   TRIG 136 10/06/2019 1340    Physical Exam:    VS:  BP 102/60   Pulse 71   Ht 5\' 11"  (1.803 m)   Wt 231 lb 3.2 oz (104.9 kg)   SpO2 97%   BMI 32.25 kg/m     Wt Readings from Last 3 Encounters:  11/14/19 231 lb 3.2 oz (104.9  kg)  10/24/19 222 lb 6.4 oz (100.9 kg)  10/24/19 217 lb (98.4 kg)     GEN:  Well nourished, well developed in no acute distress HEENT: Normal NECK: No JVD; No carotid bruits LYMPHATICS: No lymphadenopathy CARDIAC: RRR, no murmurs, rubs, gallops RESPIRATORY:  Clear to auscultation without rales, wheezing or rhonchi  ABDOMEN: Soft, non-tender, non-distended MUSCULOSKELETAL:  No edema; No deformity  SKIN: Warm and dry NEUROLOGIC:  Alert and oriented x 3 PSYCHIATRIC:  Normal affect   ASSESSMENT:    1. Paroxysmal atrial fibrillation (HCC)   2. VT (ventricular tachycardia) (HCC)   3. NICM (nonischemic cardiomyopathy) (HCC)    PLAN:    In order of problems listed above:  1. Reviewed his recent office visit from the atrial fibrillation clinic.  He is tolerating warfarin without complication.  He continues on metoprolol succinate 100 mg daily.  Suspect his recent episode of atrial fibrillation with RVR was related to his active Covid infection.  He is now maintaining sinus rhythm. 2. Patient is status post ICD, treated with mexiletine, followed by Dr. 12/24/19. 3. LV function has recovered and normalized on medical therapy with losartan, metoprolol succinate, and spironolactone.  Most recent echo is reviewed as above.  I will see the patient back in 1 year for follow-up evaluation.   Medication Adjustments/Labs and Tests Ordered: Current medicines are reviewed at length with the patient today.  Concerns regarding medicines are outlined above.  No orders of the defined types were placed in this encounter.  No orders of the defined types were placed in this encounter.   There are no Patient Instructions on file for this visit.   Signed, Elberta Fortis, MD  11/14/2019 2:05 PM    [facility]

## 2019-11-14 NOTE — Patient Instructions (Signed)
Medication Instructions:  Your provider recommends that you continue on your current medications as directed. Please refer to the Current Medication list given to you today.    *If you need a refill on your cardiac medications before your next appointment, please call your pharmacy*  Follow-Up: At [facility], you and your health needs are our priority.  As part of our continuing mission to provide you with exceptional heart care, we have created designated Provider Care Teams.  These Care Teams include your primary Cardiologist (physician) and Advanced Practice Providers (APPs -  Physician Assistants and Nurse Practitioners) who all work together to provide you with the care you need, when you need it. Your next appointment:   12 month(s) The format for your next appointment:   In Person Provider:   You may see Dr. Excell Seltzer or one of the following Advanced Practice Providers on your designated Care Team:    Tereso Newcomer, PA-C  [place], [place]

## 2019-11-19 ENCOUNTER — Ambulatory Visit: Payer: No Typology Code available for payment source | Admitting: Cardiovascular Disease

## 2019-11-20 ENCOUNTER — Ambulatory Visit (INDEPENDENT_AMBULATORY_CARE_PROVIDER_SITE_OTHER): Payer: No Typology Code available for payment source | Admitting: Orthopaedic Surgery

## 2019-11-20 ENCOUNTER — Encounter: Payer: Self-pay | Admitting: Orthopaedic Surgery

## 2019-11-20 DIAGNOSIS — M659 Synovitis and tenosynovitis, unspecified: Secondary | ICD-10-CM

## 2019-11-20 MED ORDER — LIDOCAINE HCL 1 % IJ SOLN
0.5000 mL | INTRAMUSCULAR | Status: AC | PRN
  Administered 2019-11-20: .5 mL

## 2019-11-20 MED ORDER — METHYLPREDNISOLONE ACETATE 40 MG/ML IJ SUSP
40.0000 mg | INTRAMUSCULAR | Status: AC | PRN
Start: 2019-11-20 — End: 2019-11-20
  Administered 2019-11-20: 40 mg via INTRA_ARTICULAR

## 2019-11-20 MED ORDER — BUPIVACAINE HCL 0.25 % IJ SOLN
4.0000 mL | INTRAMUSCULAR | Status: AC | PRN
  Administered 2019-11-20: 4 mL via INTRA_ARTICULAR

## 2019-11-20 NOTE — Progress Notes (Signed)
Office Visit Note   Patient: Arthur Chase           Date of Birth: 1955/09/08           MRN: [id number] Visit Date: 11/20/2019              Requested by: Shon Hale, MD [address] [place],  [place] [address] PCP: Shon Hale, MD   Assessment & Plan: Visit Diagnoses:  1. Synovitis of right knee     Plan: Patient is having persistent problems with his right knee with pain and swelling with activity.  He has not fallen and has to be careful when he goes up and down steps.  Today we aspirated his knee there was clear yellow fluid.  No cartilage particles were noted.  No blood was present in the aspirate and intra-articular injection was performed.  We will see how he does I recheck him in 5 weeks if he is not getting better pain catch without true locking.  Follow-Up Instructions: Return in about 5 weeks (around 12/25/2019).   Orders:  Orders Placed This Encounter  Procedures   Large Joint Inj   No orders of the defined types were placed in this encounter.     Procedures: Large Joint Inj: R knee on 11/20/2019 9:46 AM Indications: pain and joint swelling Details: 22 G 1.5 in needle, anterolateral approach  Arthrogram: No  Medications: 40 mg methylPREDNISolone acetate 40 MG/ML; 0.5 mL lidocaine 1 %; 4 mL bupivacaine 0.25 % Aspirate: 20 mL yellow and clear Outcome: tolerated well, no immediate complications Procedure, treatment alternatives, risks and benefits explained, specific risks discussed. Consent was given by the patient. Immediately prior to procedure a time out was called to verify the correct patient, procedure, equipment, support staff and site/side marked as required. Patient was prepped and draped in the usual sterile fashion.       Clinical Data: No additional findings.   Subjective: Chief Complaint  Patient presents with   Right Knee - Follow-up    HPI 64 year old male returns with ongoing problems with his right knee.   He has problems with his knee going up and down stairs has to be careful.  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  Previous injection gave him about 3 weeks of relief now has had recurrence of problems as well as swelling.  He does have a pacemaker and he is on Coumadin.  He states he notices more swelling by the end of the day.  His knee has been catching but has not actually fallen.  He has not had true locking.  No MRI has been done since he has a pacemaker.  Patient recently saw his cardiologist who stated he can stop his warfarin for knee arthroscopy and restarted after the procedure.  Previous knee x-rays did not show any significant osteoarthritis and there were no loose bodies.  Review of Systems positive history of atrial fib he has a cardioverter defibrillator ICD in place.  Positive history for heart failure in the past with the regularity no current symptoms.   Objective: Vital Signs: BP 117/79    Pulse 70    Ht 5\' 11"  (1.803 m)    Wt 231 lb (104.8 kg)    BMI 32.22 kg/m   Physical Exam Constitutional:      Appearance: He is well-developed.  HENT:     Head: Normocephalic  and atraumatic.  Eyes:     Pupils: Pupils are equal, round, and reactive to light.  Neck:     Thyroid: No thyromegaly.     Trachea: No tracheal deviation.  Cardiovascular:     Rate and Rhythm: Normal rate.  Pulmonary:     Effort: Pulmonary effort is normal.     Breath sounds: No wheezing.  Abdominal:     General: Bowel sounds are normal.     Palpations: Abdomen is soft.  Skin:    General: Skin is warm and dry.     Capillary Refill: Capillary refill takes less than 2 seconds.  Neurological:     Mental Status: He is alert and oriented to person, place, and time.  Psychiatric:        Behavior: Behavior normal.        Thought Content: Thought content normal.        Judgment: Judgment normal.     Ortho Exam patient has 2+ knee effusion right  knee.  He is amatory with a right knee limp negative logroll to the hips.  When he ambulates he does not get full knee extension he lacks 5 degrees reaching full extension.  Medial lateral joint line tenderness tenderness suprapatellar pouch no palpable Baker's cyst.  ACL PCL exam is normal. Specialty Comments:  No specialty comments available.  Imaging: No results found.   PMFS History: Patient Active Problem List   Diagnosis Date Noted   Synovitis of right knee 11/20/2019   Pain in right knee 10/29/2019   Secondary hypercoagulable state (HCC) 10/24/2019   Acute hypoxemic respiratory failure due to COVID-19 [facility]) 10/06/2019   Obesity    NICM (nonischemic cardiomyopathy) (HCC)    HIT (heparin-induced thrombocytopenia) (HCC)    Cardiac arrest with ventricular fibrillation (HCC)    HTN (hypertension)    ICD (implantable cardioverter-defibrillator) in place    Paroxysmal atrial fibrillation (HCC)    Acute systolic CHF (congestive heart failure) (HCC) 11/25/2015   Hypokalemia 11/25/2015   NSVT (nonsustained ventricular tachycardia) (HCC) 11/25/2015   Atrial fibrillation with rapid ventricular response (HCC) 11/20/2015   Cardiogenic shock (HCC)    Acute respiratory failure with hypoxemia (HCC) 11/16/2015   Anoxic encephalopathy (HCC) 10/21/2015   Past Medical History:  Diagnosis Date   Anoxic encephalopathy (HCC) 10/2015   a. 10/2015 in setting of cardiac arrest - recovered prior to DC.   Cardiac arrest with ventricular fibrillation (HCC)    a. 11/2015: Unclear etiology. Nl cors on cath 8/27. S/P cooling. On amiodarone and s/p ICD for secondary prevention on 12/01/15. EF down to 25-30% but normalized to 68 by subsequent cardiac MR   COVID-19 10/06/2019   HIT (heparin-induced thrombocytopenia) (HCC)    a. 11/2015 during admission for VF arrest. HIT panel positive. Placed on coumadin for 2 months   HTN (hypertension)    Hypokalemia    a. 11/2015: resolved on K  supplementation and spironolactone   ICD (implantable cardioverter-defibrillator) in place    a. Medtronic Visia AF MRI (serial  Number [id number]) ICD.   Myocardial stunning    a. 11/2015: EF down to 20-25% after VF arrest but improved to ~68 by subsequent cardiac MR.   NICM (nonischemic cardiomyopathy) (HCC)    Obesity    PAF (paroxysmal atrial fibrillation) (HCC)    a. 11/2015 brief run of afib with RVR while intubated during a complicated admission for VF arrest and VDRF. No recurrence on amio. CHADSVASC 2 (CHF, HTN) and on coumadin for HIT with  positive thrombotic markers. If no long term recurrence, he may be able to come off [facility]   Ventricular tachycardia (HCC) 10/2015    Family History  Problem Relation Age of Onset   Heart attack Mother    Alzheimer's disease Father     Past Surgical History:  Procedure Laterality Date   CARDIAC CATHETERIZATION N/A 11/16/2015   Procedure: Left Heart Cath and Coronary Angiography;  Surgeon: Peter M Swaziland, MD;  Location: [facility] INVASIVE CV LAB;  Service: Cardiovascular;  Laterality: N/A;   EP IMPLANTABLE DEVICE N/A 12/01/2015   Procedure: ICD Implant;  Surgeon: Will [name], MD;  Location: [facility] INVASIVE CV LAB;  Service: Cardiovascular;  Laterality: N/A;   Social History   Occupational History   Occupation: Engineer, site  Tobacco Use   Smoking status: Former Smoker    Types: Cigarettes    Quit date: 1995    Years since quitting: 26.6   Smokeless tobacco: Never Used  Building services engineer Use: Never used  Substance and Sexual Activity   Alcohol use: No    Comment: quit at age 77   Drug use: No   Sexual activity: Not on file

## 2019-11-22 ENCOUNTER — Other Ambulatory Visit: Payer: Self-pay | Admitting: Cardiovascular Disease

## 2019-11-29 ENCOUNTER — Ambulatory Visit (INDEPENDENT_AMBULATORY_CARE_PROVIDER_SITE_OTHER): Payer: No Typology Code available for payment source | Admitting: *Deleted

## 2019-11-29 ENCOUNTER — Other Ambulatory Visit: Payer: Self-pay

## 2019-11-29 DIAGNOSIS — D75829 Heparin-induced thrombocytopenia, unspecified: Secondary | ICD-10-CM

## 2019-11-29 DIAGNOSIS — I4891 Unspecified atrial fibrillation: Secondary | ICD-10-CM | POA: Diagnosis not present

## 2019-11-29 DIAGNOSIS — Z5181 Encounter for therapeutic drug level monitoring: Secondary | ICD-10-CM | POA: Diagnosis not present

## 2019-11-29 DIAGNOSIS — D7582 Heparin induced thrombocytopenia (HIT): Secondary | ICD-10-CM

## 2019-11-29 LAB — POCT INR: INR: 1.7 — AB (ref 2.0–3.0)

## 2019-11-29 NOTE — Patient Instructions (Signed)
Description   Today take 7.5mg  (1.5 tablets) then continue alternating 7.5mg  and 5mg  of Warfarin every other day. Recheck INR in 3 weeks.  Call coumadin clinic [phone]

## 2019-12-20 ENCOUNTER — Other Ambulatory Visit: Payer: Self-pay

## 2019-12-20 ENCOUNTER — Ambulatory Visit (INDEPENDENT_AMBULATORY_CARE_PROVIDER_SITE_OTHER): Payer: No Typology Code available for payment source

## 2019-12-20 DIAGNOSIS — D7582 Heparin induced thrombocytopenia (HIT): Secondary | ICD-10-CM | POA: Diagnosis not present

## 2019-12-20 DIAGNOSIS — D75829 Heparin-induced thrombocytopenia, unspecified: Secondary | ICD-10-CM

## 2019-12-20 DIAGNOSIS — I4891 Unspecified atrial fibrillation: Secondary | ICD-10-CM | POA: Diagnosis not present

## 2019-12-20 LAB — POCT INR: INR: 1.5 — AB (ref 2.0–3.0)

## 2019-12-20 NOTE — Patient Instructions (Signed)
Description   Today take 7.5mg  then start taking 7.5mg  daily except 5mg  on Mondays, Wednesdays and Fridays.  Recheck INR in 2 weeks.  Call coumadin clinic [phone]

## 2019-12-23 ENCOUNTER — Other Ambulatory Visit: Payer: Self-pay | Admitting: Cardiovascular Disease

## 2019-12-25 ENCOUNTER — Ambulatory Visit (INDEPENDENT_AMBULATORY_CARE_PROVIDER_SITE_OTHER): Payer: No Typology Code available for payment source | Admitting: Orthopaedic Surgery

## 2019-12-25 ENCOUNTER — Encounter: Payer: Self-pay | Admitting: Orthopaedic Surgery

## 2019-12-25 DIAGNOSIS — S83241S Other tear of medial meniscus, current injury, right knee, sequela: Secondary | ICD-10-CM | POA: Insufficient documentation

## 2019-12-25 MED ORDER — POTASSIUM CHLORIDE CRYS ER 20 MEQ PO TBCR: EXTENDED_RELEASE_TABLET | ORAL | 3 refills | Status: DC

## 2019-12-25 NOTE — Progress Notes (Signed)
Office Visit Note   Patient: Arthur Chase           Date of Birth: 06/16/1955           MRN: [id number] Visit Date: 12/25/2019              Requested by: Shon Hale, MD [address] [place],  [place] [address] PCP: Shon Hale, MD   Assessment & Plan: Visit Diagnoses:  1. Acute meniscal tear, medial, right, sequela     Plan: Patient had previous knee aspiration clear yellow fluid.  Intra-articular injection not effective.  Is not able to take anti-inflammatories due to his chronic Coumadin usage for his pacemaker defibrillator.  He is having mechanical catching in his knee with pain.  Not able get an MRI due to his pacemaker defibrillator.  We discussed options with him.  Option would be outpatient knee arthroscopy for partial meniscectomy for his repetitive locking and catching that occurs with ambulation now present x2 years.  He has been through exercise program, intermittent ice, topical creams, knee aspiration and intra-articular cortisone injection without relief.  Patient states that he is concerned he is going to fall and break something would like to proceed with outpatient arthroscopy.  Procedure discussed.  He would need to stop his Coumadin 5 days before then restarted after the procedure. Follow-Up Instructions: No follow-ups on file.   Orders:  No orders of the defined types were placed in this encounter.  No orders of the defined types were placed in this encounter.     Procedures: No procedures performed   Clinical Data: No additional findings.   Subjective: Chief Complaint  Patient presents with   Right Knee - Pain   HPI:  64 year old male with persistent problems with his right knee now for 2 years.  Injection last month gave him relief for 1 to 2 weeks with pain relief but he still has had catching in his knee and pain along the medial joint line with recurrent effusion.  He states when he is walking at times his knee will  grab catch she has to stop wiggle his knee and is not able to walk until he gets better.  He has trouble sleeping at night and when he turns over his knee wakes him up and sometimes it is stuck and is not able to extend it.  When this occurs he states he has pain along the medial joint line.  He has to go up and down stairs grabbing the rail and goes down the right leg first keeping it locked and straight states if he tries to bend it sometimes it will catch and he is concerned he will fall and has had to grab and hang onto the rail to prevent this.  Patient has a pacemaker defibrillator and is on Coumadin.  He stopped it 5 days from before his procedure and then resumes it after the procedure in the past.  Ejection fraction in the past prior to treatment was below 30%.  Last echo 10/08/2019 showed ejection fraction 55 to 60%.  Positive for hypertension atrial fib.  History of cardiac arrest 2017.  Positive for Covid 19.  Heparin-induced thrombocytopenia.  Hypertension.   Review of Systems all other systems are negative as pertains HPI.   Objective: Vital Signs: BP 117/82    Pulse 67   Physical Exam Constitutional:      Appearance: He is well-developed.  HENT:     Head: Normocephalic and atraumatic.  Eyes:     Pupils: Pupils are equal, round, and reactive to light.  Neck:     Thyroid: No thyromegaly.     Trachea: No tracheal deviation.  Cardiovascular:     Rate and Rhythm: Normal rate.  Pulmonary:     Effort: Pulmonary effort is normal.     Breath sounds: No wheezing.  Abdominal:     General: Bowel sounds are normal.     Palpations: Abdomen is soft.  Skin:    General: Skin is warm and dry.     Capillary Refill: Capillary refill takes less than 2 seconds.  Neurological:     Mental Status: He is alert and oriented to person, place, and time.  Psychiatric:        Behavior: Behavior normal.        Thought Content: Thought content normal.        Judgment: Judgment normal.     Ortho  Exam patient has mild knee effusion.  Medial joint line tenderness pain with hyperextension on the medial joint line.  Some discomfort with patellofemoral loading quadriceps contracture negative patella subluxation.  ACL PCL collateral ligament exam is normal.  Has palpable distal pulse.  No knee effusion left knee.  Specialty Comments:  No specialty comments available.  Imaging: No results found.   PMFS History: Patient Active Problem List   Diagnosis Date Noted   Acute meniscal tear, medial, right, sequela 12/25/2019   Synovitis of right knee 11/20/2019   Pain in right knee 10/29/2019   Secondary hypercoagulable state (HCC) 10/24/2019   Acute hypoxemic respiratory failure due to COVID-19 [facility]) 10/06/2019   Obesity    NICM (nonischemic cardiomyopathy) (HCC)    HIT (heparin-induced thrombocytopenia) (HCC)    Cardiac arrest with ventricular fibrillation (HCC)    HTN (hypertension)    ICD (implantable cardioverter-defibrillator) in place    Paroxysmal atrial fibrillation (HCC)    Acute systolic CHF (congestive heart failure) (HCC) 11/25/2015   Hypokalemia 11/25/2015   NSVT (nonsustained ventricular tachycardia) (HCC) 11/25/2015   Atrial fibrillation with rapid ventricular response (HCC) 11/20/2015   Cardiogenic shock (HCC)    Acute respiratory failure with hypoxemia (HCC) 11/16/2015   Anoxic encephalopathy (HCC) 10/21/2015   Past Medical History:  Diagnosis Date   Anoxic encephalopathy (HCC) 10/2015   a. 10/2015 in setting of cardiac arrest - recovered prior to DC.   Cardiac arrest with ventricular fibrillation (HCC)    a. 11/2015: Unclear etiology. Nl cors on cath 8/27. S/P cooling. On amiodarone and s/p ICD for secondary prevention on 12/01/15. EF down to 25-30% but normalized to 68 by subsequent cardiac MR   COVID-19 10/06/2019   HIT (heparin-induced thrombocytopenia) (HCC)    a. 11/2015 during admission for VF arrest. HIT panel positive. Placed on coumadin  for 2 months   HTN (hypertension)    Hypokalemia    a. 11/2015: resolved on K supplementation and spironolactone   ICD (implantable cardioverter-defibrillator) in place    a. Medtronic Visia AF MRI (serial  Number [id number]) ICD.   Myocardial stunning    a. 11/2015: EF down to 20-25% after VF arrest but improved to ~68 by subsequent cardiac MR.   NICM (nonischemic cardiomyopathy) (HCC)    Obesity    PAF (paroxysmal atrial fibrillation) (HCC)    a. 11/2015 brief run of afib with RVR while intubated during a complicated admission for VF arrest and VDRF. No recurrence on amio. CHADSVASC 2 (CHF, HTN) and on coumadin for HIT with positive thrombotic markers.  If no long term recurrence, he may be able to come off [facility]   Ventricular tachycardia (HCC) 10/2015    Family History  Problem Relation Age of Onset   Heart attack Mother    Alzheimer's disease Father     Past Surgical History:  Procedure Laterality Date   CARDIAC CATHETERIZATION N/A 11/16/2015   Procedure: Left Heart Cath and Coronary Angiography;  Surgeon: Peter M Swaziland, MD;  Location: [facility] INVASIVE CV LAB;  Service: Cardiovascular;  Laterality: N/A;   EP IMPLANTABLE DEVICE N/A 12/01/2015   Procedure: ICD Implant;  Surgeon: Will [name], MD;  Location: [facility] INVASIVE CV LAB;  Service: Cardiovascular;  Laterality: N/A;   Social History   Occupational History   Occupation: Engineer, site  Tobacco Use   Smoking status: Former Smoker    Types: Cigarettes    Quit date: 1995    Years since quitting: 26.7   Smokeless tobacco: Never Used  Building services engineer Use: Never used  Substance and Sexual Activity   Alcohol use: No    Comment: quit at age 28   Drug use: No   Sexual activity: Not on file

## 2020-01-03 ENCOUNTER — Other Ambulatory Visit: Payer: Self-pay

## 2020-01-03 ENCOUNTER — Ambulatory Visit (INDEPENDENT_AMBULATORY_CARE_PROVIDER_SITE_OTHER): Payer: No Typology Code available for payment source | Admitting: *Deleted

## 2020-01-03 DIAGNOSIS — D7582 Heparin induced thrombocytopenia (HIT): Secondary | ICD-10-CM

## 2020-01-03 DIAGNOSIS — I4891 Unspecified atrial fibrillation: Secondary | ICD-10-CM | POA: Diagnosis not present

## 2020-01-03 DIAGNOSIS — Z5181 Encounter for therapeutic drug level monitoring: Secondary | ICD-10-CM | POA: Diagnosis not present

## 2020-01-03 DIAGNOSIS — D75829 Heparin-induced thrombocytopenia, unspecified: Secondary | ICD-10-CM

## 2020-01-03 LAB — POCT INR: INR: 2 (ref 2.0–3.0)

## 2020-01-03 NOTE — Patient Instructions (Signed)
Description   Today take 10mg  then continue taking 7.5mg  daily except 5mg  on Mondays, Wednesdays and Fridays. Recheck INR in 3 weeks. Call coumadin clinic [phone]

## 2020-01-07 ENCOUNTER — Telehealth: Payer: Self-pay | Admitting: *Deleted

## 2020-01-07 NOTE — Telephone Encounter (Signed)
° °  [facility] Pre-operative Risk Assessment    HEARTCARE STAFF: - Please ensure there is not already an duplicate clearance open for this procedure. - Under Visit Info/Reason for Call, type in Other and utilize the format Clearance MM/DD/YY or Clearance TBD. Do not use dashes or single digits. - If request is for dental extraction, please clarify the # of teeth to be extracted.  Request for surgical clearance:  1. What type of surgery is being performed? RIGHT KNEE ARTHROPLASTY    2. When is this surgery scheduled? TBD   3. What type of clearance is required (medical clearance vs. Pharmacy clearance to hold med vs. Both)? BOTH  4. Are there any medications that need to be held prior to surgery and how long? WARFARIN x 5 DAYS PRIOR TO SURGERY   5. Practice name and name of physician performing surgery? [facility]; DR. Sikeston   6. What is the office phone number? [phone]   7.   What is the office fax number? [phone]  8.   Anesthesia type (None, local, MAC, general) ? NOT LISTED   Julaine Hua 01/07/2020, 4:35 PM  _________________________________________________________________   (provider comments below)

## 2020-01-08 NOTE — Telephone Encounter (Signed)
Coumadin for HIT, can you please give recommendations for holding for TKA?

## 2020-01-08 NOTE — Telephone Encounter (Signed)
   Primary Cardiologist: Tonny Bollman, MD  Chart reviewed as part of pre-operative protocol coverage. Patient was contacted 01/08/2020 in reference to pre-operative risk assessment for pending surgery as outlined below.  Arthur Chase was last seen on 11/14/19 by Dr. Excell Seltzer.  Since that day, Arthur Chase has done well. He can complete more 4.0 METS without angina.   Per our clinical pharmacist: Patient with diagnosis of A Fib on warfarin for anticoagulation.    Procedure: RIGHT KNEE ARTHROPLASTY Date of procedure: TBD 60746} CHA2DS2-VASc Score = 2  This indicates a 2.2% annual risk of stroke. The patient's score is based upon: CHF History: 1 HTN History: 1 Diabetes History: 0 Stroke History: 0 Vascular Disease History: 0 Age Score: 0 Gender Score: 0    CrCl .min using adjusted body weight Platelet count 322K  Per office protocol, patient can hold warfarin for 5 days prior to procedure.    Patient will not need bridging with Lovenox (enoxaparin) around procedure.  If not bridging, patient should restart warfarin on the evening of procedure or day after, at discretion of procedure MD  Therefore, based on ACC/AHA guidelines, the patient would be at acceptable risk for the planned procedure without further cardiovascular testing.   The patient was advised that if he develops new symptoms prior to surgery to contact our office to arrange for a follow-up visit, and he verbalized understanding.  I will route this recommendation to the requesting party via Epic fax function and remove from pre-op pool. Please call with questions.  Roe Rutherford Kourtlynn Trevor, PA 01/08/2020, 1:46 PM

## 2020-01-08 NOTE — Telephone Encounter (Signed)
Patient with diagnosis of A Fib on warfarin for anticoagulation.    Procedure:  RIGHT KNEE ARTHROPLASTY   Date of procedure: TBD 60746} CHA2DS2-VASc Score = 2  This indicates a 2.2% annual risk of stroke. The patient's score is based upon: CHF History: 1 HTN History: 1 Diabetes History: 0 Stroke History: 0 Vascular Disease History: 0 Age Score: 0 Gender Score: 0    CrCl .min using adjusted body weight Platelet count 322K  Per office protocol, patient can hold warfarin for 5 days prior to procedure.    Patient will not need bridging with Lovenox (enoxaparin) around procedure.  If not bridging, patient should restart warfarin on the evening of procedure or day after, at discretion of procedure MD

## 2020-01-09 ENCOUNTER — Other Ambulatory Visit: Payer: Self-pay

## 2020-01-09 ENCOUNTER — Telehealth: Payer: Self-pay | Admitting: Orthopaedic Surgery

## 2020-01-09 NOTE — Telephone Encounter (Signed)
Patient's left knee arthroscopy, partial medial meniscectomy on 01-14-20 with Dr. Ophelia Charter has been moved from [facility] to [facility].  Patient is cleared from a cardiac standpoint.  Please refer to Cardiology notes on 01/08/20.  Patient is aware of location, date & time of surgery.  Patient said he received a call from [facility] yesterday and the nurse provided him with his blood thinner instructions.

## 2020-01-09 NOTE — Telephone Encounter (Signed)
Thanks got it

## 2020-01-09 NOTE — Telephone Encounter (Signed)
FYI

## 2020-01-10 ENCOUNTER — Other Ambulatory Visit (HOSPITAL_COMMUNITY)
Admission: RE | Admit: 2020-01-10 | Discharge: 2020-01-10 | Disposition: A | Discharge status: Home / Self Care | Payer: No Typology Code available for payment source | Source: Ambulatory Visit | Attending: Orthopaedic Surgery | Admitting: Orthopaedic Surgery

## 2020-01-10 DIAGNOSIS — Z20822 Contact with and (suspected) exposure to covid-19: Secondary | ICD-10-CM | POA: Insufficient documentation

## 2020-01-10 DIAGNOSIS — Z01812 Encounter for preprocedural laboratory examination: Secondary | ICD-10-CM | POA: Diagnosis present

## 2020-01-10 LAB — SARS CORONAVIRUS 2 (TAT 6-24 HRS): SARS Coronavirus 2: NEGATIVE

## 2020-01-11 ENCOUNTER — Encounter: Payer: Self-pay | Admitting: Cardiology

## 2020-01-11 ENCOUNTER — Other Ambulatory Visit: Payer: Self-pay | Admitting: Cardiology

## 2020-01-11 ENCOUNTER — Encounter (HOSPITAL_COMMUNITY): Payer: Self-pay | Admitting: Orthopaedic Surgery

## 2020-01-11 NOTE — Progress Notes (Signed)
Preop call completed with patient.  Patient is not diabetic.  On Coumadin - PT/INR DOS.  [employer] rep emailed and device orders sent.  Covid test negative.  Patient denies chest pain, shortness of breath or any cardiac symptoms. Chart sent to anesthesia for review.

## 2020-01-11 NOTE — Progress Notes (Signed)
Your procedure is scheduled on Monday, October 25th.  Report to [facility] Main Entrance "A" at 8:30 A.M., and check in at the Admitting office.  Call this number if you have problems the morning of surgery:  [phone]  Call [phone] if you have any questions prior to your surgery date Monday-Friday 8am-4pm   Remember:  Do not eat or drink after midnight the night before your surgery   Take these medicines the morning of surgery with A SIP OF WATER  fluticasone (FLONASE) mexiletine (MEXITIL)  pantoprazole (PROTONIX)  If needed:  albuterol (VENTOLIN HFA)/inhaler -bring with you the day of surgery  Follow your surgeon's instructions on when to stop Aspirin warfarin (COUMADIN).  If no instructions were given by your surgeon then you will need to call the office to get those instructions.    As of today, STOP taking any Aspirin (unless otherwise instructed by your surgeon) Aleve, Naproxen, Ibuprofen, Motrin, Advil, Goody's, BC's, all herbal medications, fish oil, and all vitamins.                     Do not wear jewelry.            Do not wear lotions, powders, colognes, or deodorant.            Men may shave face and neck.            Do not bring valuables to the hospital.            [facility] is not responsible for any belongings or valuables.  Do NOT Smoke (Tobacco/Vaping) or drink Alcohol 24 hours prior to your procedure If you use a CPAP at night, you may bring all equipment for your overnight stay.   Contacts, glasses, dentures or bridgework may not be worn into surgery.      For patients admitted to the hospital, discharge time will be determined by your treatment team.   Patients discharged the day of surgery will not be allowed to drive home, and someone needs to stay with them for 24 hours.  Day of Surgery: Shower Wear Clean/Comfortable clothing the morning of surgery Do not apply any deodorants/lotions.   Remember to brush your teeth WITH YOUR REGULAR  TOOTHPASTE.   Please read over the following fact sheets that you were given.

## 2020-01-11 NOTE — Anesthesia Preprocedure Evaluation (Addendum)
Anesthesia Evaluation  Patient identified by MRN, date of birth, ID band Patient awake    Reviewed: Allergy & Precautions, NPO status , Patient's Chart, lab work & pertinent test results  Airway Mallampati: II  TM Distance: >3 FB Neck ROM: Full    Dental no notable dental hx.    Pulmonary former smoker,  H/o COVID in 09/2019   Pulmonary exam normal breath sounds clear to auscultation       Cardiovascular Exercise Tolerance: Good METS: 3 - Mets hypertension, +CHF  Normal cardiovascular exam+ Cardiac Defibrillator (on warfarin (holding for 5 days per cards rec))  Rhythm:Regular Rate:Normal  EKG: 10/24/19: Normal sinus rhythm Left axis deviation Abnormal ECG Since last tracing Atrial fibrillation has resolved Confirmed by Armanda Magic ([id number]) on 10/24/2019 8:32:27 PM   CV: Echo 10/08/19: IMPRESSIONS  1. Left ventricular ejection fraction, by estimation, is 55 to 60%. The  left ventricle has normal function. The left ventricle has no regional  wall motion abnormalities. Left ventricular diastolic function could not  be evaluated.  2. Right ventricular systolic function is normal. The right ventricular  size is normal. There is normal pulmonary artery systolic pressure.  3. The mitral valve is abnormal. No evidence of mitral stenosis.  4. The aortic valve is tricuspid. Aortic valve regurgitation is not  visualized. No aortic stenosis is present.  5. The inferior vena cava is normal in size with greater than 50%  respiratory variability, suggesting right atrial pressure of 3 mmHg.  - Comparison(s): No significant change from prior study.  - Conclusion(s)/Recommendation(s): Normal biventricular function without  evidence of hemodynamically significant valvular heart disease.     Neuro/Psych Anoxic encephalopathy from out of hospital arrest in 2017    GI/Hepatic negative GI ROS, Neg liver ROS,   Endo/Other   Hypothyroidism   Renal/GU negative Renal ROS     Musculoskeletal   Abdominal   Peds  Hematology   Anesthesia Other Findings   Reproductive/Obstetrics                           Anesthesia Physical Anesthesia Plan  ASA: III  Anesthesia Plan: General   Post-op Pain Management:    Induction: Intravenous  PONV Risk Score and Plan: 2 and Ondansetron, Dexamethasone and Treatment may vary due to age or medical condition  Airway Management Planned: LMA  Additional Equipment:   Intra-op Plan:   Post-operative Plan: Extubation in OR  Informed Consent: I have reviewed the patients History and Physical, chart, labs and discussed the procedure including the risks, benefits and alternatives for the proposed anesthesia with the patient or authorized representative who has indicated his/her understanding and acceptance.       Plan Discussed with: CRNA and Anesthesiologist  Anesthesia Plan Comments: (Magnet for AICD. GA/LMA. Patient not interested in a preop block. I discussed the risks/benefits/options. He prefers to "just see how I do." I did review it just in case he decides he would like it in the PACU. Tanna Furry, MD  )     Anesthesia Quick Evaluation

## 2020-01-11 NOTE — Progress Notes (Signed)
PERIOPERATIVE PRESCRIPTION FOR IMPLANTED CARDIAC DEVICE PROGRAMMING  Patient Information: Name:  Arthur Chase  DOB:  1955-04-19  MRN:  [id number]    [name], RN  [facility] Device Cc: [name], RN Planned Procedure: right knee arthroscopy partial medial meniscectomy  Surgeon: Dr. Annell Greening  Date of Procedure: 01/14/2020  Cautery will be used.  Position during surgery: Supine   Please send documentation back to:  [facility] (Fax # [phone])   [name] , RN  01/11/2020 12:39 PM   Device Information:  Clinic EP Physician:  Loman Brooklyn, MD   Device Type:  Defibrillator Manufacturer and Phone #:  Medtronic: [id number] Pacemaker Dependent?:  No. Date of Last Device Check:  11/08/19 Normal Device Function?:  Yes.    Electrophysiologist's Recommendations:   Have magnet available.  Provide continuous ECG monitoring when magnet is used or reprogramming is to be performed.   Procedure should not interfere with device function.  No device programming or magnet placement needed.  Per Device Clinic Standing Orders, Linton Ham, RN  4:25 PM 01/11/2020

## 2020-01-11 NOTE — Progress Notes (Signed)
Anesthesia Chart Review: SAME DAY WORK-UP   Case: [id number] Date/Time: 01/14/20 1045   Procedure: right knee arthroscopy partial medial meniscectomy (Right Knee)   Anesthesia type: Choice   Pre-op diagnosis: right knee medial meniscal tear   [facility]OM [facility] / [facility]   Surgeons: Eldred Manges, MD      DISCUSSION: Patient is a 64 year old male scheduled for the above procedure.  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  Preoperative cardiology input outlined on 01/08/20 by Micah Flesher, PA: "Arthur Chase was last seen on 11/14/19 by Dr. Excell Seltzer.  Since that day, Arthur Chase has done well. He can complete more 4.0 METS without angina...  Per office protocol, patient can holdwarfarinfor 5days prior to procedure.   Patient will not need bridging with Lovenox (enoxaparin) around procedure.  If not bridging, patient should restartwarfarinon the evening of procedure or day after, at discretion of procedure MD  Therefore, based on ACC/AHA guidelines, the patient would be at acceptable risk for the planned procedure without further cardiovascular testing.   The patient was advised that if he develops new symptoms prior to surgery to contact our office to arrange for a follow-up visit, and he verbalized understanding."  Preoperative COVID-19 test negative on 01/10/2020.  He is a same-day work-up, so he will need labs and anesthesia team to evaluate on the day of surgery.   VS:   Wt Readings from Last 3 Encounters:  11/20/19 104.8 kg  11/14/19 104.9 kg  10/24/19 100.9 kg   BP Readings from Last 3 Encounters:  12/25/19 117/82  11/20/19 117/79   11/14/19 102/60   Pulse Readings from Last 3 Encounters:  12/25/19 67  11/20/19 70  11/14/19 71    PROVIDERS: Shon Hale, MD is PCP  Tonny Bollman, MD is cardiologist. Last visit 11/14/19.  Loman Brooklyn, MD is EP cardiologist, last visit 05/21/19 with [facility] visit 10/24/19 with Alphonzo Severance, PA.   LABS: For day of surgery. As of 10/10/19, Cr 0.73, H/H 13.7/41.5, PLT 322, glucose 137.     IMAGES: 1V PCXR 10/06/19: FINDINGS: Stable position of the left cardiac ICD. Heart size is within normal limits. Surgical plate in the lower cervical spine. Small new densities at the left lung base. Otherwise, the lungs are clear. Negative for a pneumothorax. IMPRESSION: Small densities at the left lung base are nonspecific. Findings could represent atelectasis versus small focus of infection.   EKG: 10/24/19: Normal sinus rhythm Left axis deviation Abnormal ECG Since last tracing Atrial fibrillation has resolved Confirmed by Armanda Magic [id number]) on 10/24/2019 8:32:27 PM   CV: Echo 10/08/19: IMPRESSIONS  1. Left ventricular ejection fraction, by estimation, is 55 to 60%. The  left ventricle has normal function. The left ventricle has no regional  wall motion abnormalities. Left ventricular diastolic function could not  be evaluated.  2. Right ventricular systolic function is normal. The right ventricular  size is normal. There is normal pulmonary artery systolic pressure.  3. The mitral valve is abnormal. No evidence of mitral stenosis.  4. The aortic valve is tricuspid. Aortic valve regurgitation is not  visualized. No aortic stenosis is present.  5. The inferior vena cava is normal in size with greater than 50%  respiratory variability, suggesting right atrial pressure of 3 mmHg.  - Comparison(s): No significant change from prior study.  - Conclusion(s)/Recommendation(s): Normal biventricular function without  evidence of hemodynamically significant valvular  heart disease.    MRI Cardiac 11/26/15: IMPRESSION: 1) Moderate LVH EF 68% no RWMA;s 2) No delayed gadolinium uptake No scar/infiltration of LV myocardium 3) Normal RV size and function   Cardiac cath 11/16/15:  The left ventricular systolic function is normal.  LV end diastolic pressure is normal.  The left ventricular ejection fraction is 55-65% by visual estimate. 1. Normal coronary anatomy 2. Normal LV function - By echo LVEF 20% 11/17/15, 25-30% 11/20/15, and 55-60% 03/23/16   Past Medical History:  Diagnosis Date  . Anoxic encephalopathy (HCC) 10/2015   a. 10/2015 in setting of cardiac arrest - recovered prior to DC.  . Cardiac arrest with ventricular fibrillation (HCC)    a. 11/2015: Unclear etiology. Nl cors on cath 8/27. S/P cooling. On amiodarone and s/p ICD for secondary prevention on 12/01/15. EF down to 25-30% but normalized to 68 by subsequent cardiac MR  . COVID-19 10/06/2019  . HIT (heparin-induced thrombocytopenia) (HCC)    a. 11/2015 during admission for VF arrest. HIT panel positive. Placed on coumadin for 2 months  . HTN (hypertension)   . Hypokalemia    a. 11/2015: resolved on K supplementation and spironolactone  . ICD (implantable cardioverter-defibrillator) in place    a. Medtronic Visia AF MRI (serial  Number [id number]) ICD.  Marland Kitchen Myocardial stunning    a. 11/2015: EF down to 20-25% after VF arrest but improved to ~68 by subsequent cardiac MR.  Marland Kitchen NICM (nonischemic cardiomyopathy) (HCC)   . Obesity   . PAF (paroxysmal atrial fibrillation) (HCC)    a. 11/2015 brief run of afib with RVR while intubated during a complicated admission for VF arrest and VDRF. No recurrence on amio. CHADSVASC 2 (CHF, HTN) and on coumadin for HIT with positive thrombotic markers. If no long term recurrence, he may be able to come off OAC  . Ventricular tachycardia (HCC) 10/2015    Past Surgical History:  Procedure Laterality Date  . CARDIAC CATHETERIZATION N/A 11/16/2015   Procedure:  Left Heart Cath and Coronary Angiography;  Surgeon: Peter M Swaziland, MD;  Location: [facility] INVASIVE CV LAB;  Service: Cardiovascular;  Laterality: N/A;  . EP IMPLANTABLE DEVICE N/A 12/01/2015   Procedure: ICD Implant;  Surgeon: Will [name], MD;  Location: [facility] INVASIVE CV LAB;  Service: Cardiovascular;  Laterality: N/A;    MEDICATIONS: No current facility-administered medications for this encounter.   Marland Kitchen albuterol (VENTOLIN HFA) 108 (90 Base) MCG/ACT inhaler  . fluticasone (FLONASE) 50 MCG/ACT nasal spray  . levothyroxine (SYNTHROID) 75 MCG tablet  . losartan (COZAAR) 25 MG tablet  . metoprolol succinate (TOPROL-XL) 100 MG 24 hr tablet  . mexiletine (MEXITIL) 200 MG capsule  . Multiple Vitamin (MULTIVITAMIN WITH MINERALS) TABS tablet  . pantoprazole (PROTONIX) 40 MG tablet  . potassium chloride SA (KLOR-CON M20) 20 MEQ tablet  . warfarin (COUMADIN) 5 MG tablet  . spironolactone (ALDACTONE) 25 MG tablet    Shonna Chock, PA-C Surgical Short Stay/Anesthesiology [facility] Phone [phone] [facility] Phone [phone] 01/11/2020 3:20 PM

## 2020-01-14 ENCOUNTER — Encounter (HOSPITAL_COMMUNITY): Payer: Self-pay | Admitting: Orthopaedic Surgery

## 2020-01-14 ENCOUNTER — Encounter (HOSPITAL_COMMUNITY)
Admission: RE | Disposition: A | Discharge status: Home / Self Care | Payer: Self-pay | Source: Home / Self Care | Attending: Orthopaedic Surgery

## 2020-01-14 ENCOUNTER — Ambulatory Visit (HOSPITAL_COMMUNITY): Payer: No Typology Code available for payment source | Admitting: Vascular Surgery

## 2020-01-14 ENCOUNTER — Other Ambulatory Visit: Payer: Self-pay

## 2020-01-14 ENCOUNTER — Ambulatory Visit (HOSPITAL_COMMUNITY)
Admission: RE | Admit: 2020-01-14 | Discharge: 2020-01-14 | Disposition: A | Discharge status: Home / Self Care | Payer: No Typology Code available for payment source | Attending: Orthopaedic Surgery | Admitting: Orthopaedic Surgery

## 2020-01-14 DIAGNOSIS — M1711 Unilateral primary osteoarthritis, right knee: Secondary | ICD-10-CM | POA: Diagnosis not present

## 2020-01-14 DIAGNOSIS — S83241S Other tear of medial meniscus, current injury, right knee, sequela: Secondary | ICD-10-CM

## 2020-01-14 DIAGNOSIS — Z6832 Body mass index (BMI) 32.0-32.9, adult: Secondary | ICD-10-CM | POA: Diagnosis not present

## 2020-01-14 DIAGNOSIS — D6869 Other thrombophilia: Secondary | ICD-10-CM | POA: Diagnosis not present

## 2020-01-14 DIAGNOSIS — M23331 Other meniscus derangements, other medial meniscus, right knee: Secondary | ICD-10-CM | POA: Diagnosis not present

## 2020-01-14 DIAGNOSIS — Z8674 Personal history of sudden cardiac arrest: Secondary | ICD-10-CM | POA: Diagnosis not present

## 2020-01-14 DIAGNOSIS — X58XXXA Exposure to other specified factors, initial encounter: Secondary | ICD-10-CM | POA: Insufficient documentation

## 2020-01-14 DIAGNOSIS — Z79890 Hormone replacement therapy (postmenopausal): Secondary | ICD-10-CM | POA: Diagnosis not present

## 2020-01-14 DIAGNOSIS — E669 Obesity, unspecified: Secondary | ICD-10-CM | POA: Insufficient documentation

## 2020-01-14 DIAGNOSIS — M94261 Chondromalacia, right knee: Secondary | ICD-10-CM | POA: Diagnosis not present

## 2020-01-14 DIAGNOSIS — I110 Hypertensive heart disease with heart failure: Secondary | ICD-10-CM | POA: Insufficient documentation

## 2020-01-14 DIAGNOSIS — Z950 Presence of cardiac pacemaker: Secondary | ICD-10-CM | POA: Diagnosis not present

## 2020-01-14 DIAGNOSIS — S83241A Other tear of medial meniscus, current injury, right knee, initial encounter: Secondary | ICD-10-CM | POA: Insufficient documentation

## 2020-01-14 DIAGNOSIS — I472 Ventricular tachycardia: Secondary | ICD-10-CM | POA: Insufficient documentation

## 2020-01-14 DIAGNOSIS — Z87891 Personal history of nicotine dependence: Secondary | ICD-10-CM | POA: Diagnosis not present

## 2020-01-14 DIAGNOSIS — Z79899 Other long term (current) drug therapy: Secondary | ICD-10-CM | POA: Diagnosis not present

## 2020-01-14 DIAGNOSIS — I480 Paroxysmal atrial fibrillation: Secondary | ICD-10-CM | POA: Diagnosis not present

## 2020-01-14 DIAGNOSIS — M6751 Plica syndrome, right knee: Secondary | ICD-10-CM | POA: Diagnosis not present

## 2020-01-14 DIAGNOSIS — I509 Heart failure, unspecified: Secondary | ICD-10-CM | POA: Insufficient documentation

## 2020-01-14 DIAGNOSIS — Z8616 Personal history of COVID-19: Secondary | ICD-10-CM | POA: Diagnosis not present

## 2020-01-14 DIAGNOSIS — Z7901 Long term (current) use of anticoagulants: Secondary | ICD-10-CM | POA: Diagnosis not present

## 2020-01-14 DIAGNOSIS — I428 Other cardiomyopathies: Secondary | ICD-10-CM | POA: Diagnosis not present

## 2020-01-14 DIAGNOSIS — M2341 Loose body in knee, right knee: Secondary | ICD-10-CM | POA: Insufficient documentation

## 2020-01-14 HISTORY — DX: Presence of automatic (implantable) cardiac defibrillator: Z95.810

## 2020-01-14 HISTORY — DX: Hypothyroidism, unspecified: E03.9

## 2020-01-14 HISTORY — PX: KNEE ARTHROSCOPY: SHX127

## 2020-01-14 LAB — CBC
HCT: 43.8 % (ref 39.0–52.0)
Hemoglobin: 14.7 g/dL (ref 13.0–17.0)
MCH: 31.4 pg (ref 26.0–34.0)
MCHC: 33.6 g/dL (ref 30.0–36.0)
MCV: 93.6 fL (ref 80.0–100.0)
Platelets: 193 10*3/uL (ref 150–400)
RBC: 4.68 MIL/uL (ref 4.22–5.81)
RDW: 12.6 % (ref 11.5–15.5)
WBC: 6.7 10*3/uL (ref 4.0–10.5)
nRBC: 0 % (ref 0.0–0.2)

## 2020-01-14 LAB — BASIC METABOLIC PANEL
Anion gap: 8 (ref 5–15)
BUN: 14 mg/dL (ref 8–23)
CO2: 24 mmol/L (ref 22–32)
Calcium: 9.7 mg/dL (ref 8.9–10.3)
Chloride: 107 mmol/L (ref 98–111)
Creatinine, Ser: 0.7 mg/dL (ref 0.61–1.24)
GFR, Estimated: >60 mL/min (ref >60)
Glucose, Bld: 99 mg/dL (ref 70–99)
Potassium: 3.8 mmol/L (ref 3.5–5.1)
Sodium: 139 mmol/L (ref 135–145)

## 2020-01-14 LAB — PROTIME-INR
INR: 1.1 (ref 0.8–1.2)
Prothrombin Time: 13.6 seconds (ref 11.4–15.2)

## 2020-01-14 LAB — SURGICAL PCR SCREEN
MRSA, PCR: NEGATIVE
Staphylococcus aureus: NEGATIVE

## 2020-01-14 SURGERY — ARTHROSCOPY, KNEE
Anesthesia: General | Site: Knee | Laterality: Right

## 2020-01-14 MED ORDER — DEXAMETHASONE SODIUM PHOSPHATE 4 MG/ML IJ SOLN
INTRAMUSCULAR | Status: DC | PRN
  Administered 2020-01-14: 10 mg via INTRAVENOUS

## 2020-01-14 MED ORDER — SUCCINYLCHOLINE CHLORIDE 200 MG/10ML IV SOSY
PREFILLED_SYRINGE | INTRAVENOUS | Status: AC
  Filled 2020-01-14: qty 20

## 2020-01-14 MED ORDER — BUPIVACAINE HCL (PF) 0.25 % IJ SOLN
INTRAMUSCULAR | Status: AC
  Filled 2020-01-14: qty 30

## 2020-01-14 MED ORDER — OXYCODONE HCL 5 MG/5ML PO SOLN: 5.0000 mg | Freq: Once | ORAL | Status: AC | PRN

## 2020-01-14 MED ORDER — PROPOFOL 10 MG/ML IV BOLUS
INTRAVENOUS | Status: AC
  Filled 2020-01-14: qty 20

## 2020-01-14 MED ORDER — HYDROMORPHONE HCL 1 MG/ML IJ SOLN
0.2500 mg | INTRAMUSCULAR | Status: DC | PRN
  Administered 2020-01-14: 0.5 mg via INTRAVENOUS

## 2020-01-14 MED ORDER — ACETAMINOPHEN 500 MG PO TABS
1000.0000 mg | ORAL_TABLET | Freq: Once | ORAL | Status: AC
  Administered 2020-01-14: 1000 mg via ORAL
  Filled 2020-01-14: qty 2

## 2020-01-14 MED ORDER — LIDOCAINE 2% (20 MG/ML) 5 ML SYRINGE
INTRAMUSCULAR | Status: AC
  Filled 2020-01-14: qty 20

## 2020-01-14 MED ORDER — CHLORHEXIDINE GLUCONATE 4 % EX LIQD: 60.0000 mL | Freq: Once | CUTANEOUS | Status: DC

## 2020-01-14 MED ORDER — DEXMEDETOMIDINE (PRECEDEX) IN NS 20 MCG/5ML (4 MCG/ML) IV SYRINGE
PREFILLED_SYRINGE | INTRAVENOUS | Status: AC
  Filled 2020-01-14: qty 5

## 2020-01-14 MED ORDER — ONDANSETRON HCL 4 MG/2ML IJ SOLN
INTRAMUSCULAR | Status: DC | PRN
  Administered 2020-01-14: 4 mg via INTRAVENOUS

## 2020-01-14 MED ORDER — LACTATED RINGERS IV SOLN: INTRAVENOUS | Status: DC | PRN

## 2020-01-14 MED ORDER — HYDROCODONE-ACETAMINOPHEN 7.5-325 MG PO TABS: 1.0000 | ORAL_TABLET | Freq: Four times a day (QID) | ORAL | 0 refills | Status: DC | PRN

## 2020-01-14 MED ORDER — HYDROMORPHONE HCL 1 MG/ML IJ SOLN
INTRAMUSCULAR | Status: AC
  Filled 2020-01-14: qty 1

## 2020-01-14 MED ORDER — MIDAZOLAM HCL 2 MG/2ML IJ SOLN
INTRAMUSCULAR | Status: AC
  Filled 2020-01-14: qty 2

## 2020-01-14 MED ORDER — PROPOFOL 10 MG/ML IV BOLUS
INTRAVENOUS | Status: DC | PRN
  Administered 2020-01-14: 200 mg via INTRAVENOUS
  Administered 2020-01-14 (×2): 50 mg via INTRAVENOUS

## 2020-01-14 MED ORDER — ONDANSETRON HCL 4 MG/2ML IJ SOLN
INTRAMUSCULAR | Status: AC
  Filled 2020-01-14: qty 8

## 2020-01-14 MED ORDER — LIDOCAINE HCL (CARDIAC) PF 100 MG/5ML IV SOSY
PREFILLED_SYRINGE | INTRAVENOUS | Status: DC | PRN
  Administered 2020-01-14: 80 mg via INTRAVENOUS

## 2020-01-14 MED ORDER — CEFAZOLIN SODIUM 1 G IJ SOLR
INTRAMUSCULAR | Status: AC
  Filled 2020-01-14: qty 10

## 2020-01-14 MED ORDER — FENTANYL CITRATE (PF) 100 MCG/2ML IJ SOLN
INTRAMUSCULAR | Status: DC | PRN
  Administered 2020-01-14 (×2): 50 ug via INTRAVENOUS

## 2020-01-14 MED ORDER — MIDAZOLAM HCL 5 MG/5ML IJ SOLN
INTRAMUSCULAR | Status: DC | PRN
  Administered 2020-01-14: 2 mg via INTRAVENOUS

## 2020-01-14 MED ORDER — FENTANYL CITRATE (PF) 250 MCG/5ML IJ SOLN
INTRAMUSCULAR | Status: AC
  Filled 2020-01-14: qty 5

## 2020-01-14 MED ORDER — WARFARIN SODIUM 5 MG PO TABS
ORAL_TABLET | ORAL | 0 refills | Status: DC
Start: 2020-01-14 — End: 2020-01-28

## 2020-01-14 MED ORDER — PROMETHAZINE HCL 25 MG/ML IJ SOLN: 6.2500 mg | INTRAMUSCULAR | Status: DC | PRN

## 2020-01-14 MED ORDER — OXYCODONE HCL 5 MG PO TABS
5.0000 mg | ORAL_TABLET | Freq: Once | ORAL | Status: AC | PRN
  Administered 2020-01-14: 5 mg via ORAL

## 2020-01-14 MED ORDER — CHLORHEXIDINE GLUCONATE 0.12 % MT SOLN
OROMUCOSAL | Status: AC
  Filled 2020-01-14: qty 15

## 2020-01-14 MED ORDER — OXYCODONE HCL 5 MG PO TABS
ORAL_TABLET | ORAL | Status: AC
  Filled 2020-01-14: qty 1

## 2020-01-14 MED ORDER — SODIUM CHLORIDE 0.9 % IR SOLN
Status: DC | PRN
  Administered 2020-01-14 (×2): 3000 mL

## 2020-01-14 MED ORDER — ALBUTEROL SULFATE HFA 108 (90 BASE) MCG/ACT IN AERS
INHALATION_SPRAY | RESPIRATORY_TRACT | Status: AC
  Filled 2020-01-14: qty 6.7

## 2020-01-14 MED ORDER — DEXAMETHASONE SODIUM PHOSPHATE 10 MG/ML IJ SOLN
INTRAMUSCULAR | Status: AC
  Filled 2020-01-14: qty 3

## 2020-01-14 MED ORDER — SUGAMMADEX SODIUM 500 MG/5ML IV SOLN
INTRAVENOUS | Status: AC
  Filled 2020-01-14: qty 5

## 2020-01-14 MED ORDER — BUPIVACAINE HCL (PF) 0.25 % IJ SOLN
INTRAMUSCULAR | Status: DC | PRN
  Administered 2020-01-14: 10 mL

## 2020-01-14 MED ORDER — POVIDONE-IODINE 10 % EX SWAB
2.0000 "application " | Freq: Once | CUTANEOUS | Status: AC
  Administered 2020-01-14: 2 via TOPICAL

## 2020-01-14 MED ORDER — ROCURONIUM BROMIDE 10 MG/ML (PF) SYRINGE
PREFILLED_SYRINGE | INTRAVENOUS | Status: AC
  Filled 2020-01-14: qty 30

## 2020-01-14 SURGICAL SUPPLY — 39 items
APL SKNCLS STERI-STRIP NONHPOA (GAUZE/BANDAGES/DRESSINGS) ×1
BENZOIN TINCTURE PRP APPL 2/3 (GAUZE/BANDAGES/DRESSINGS) ×2 IMPLANT
BLADE CUDA 5.5 (BLADE) IMPLANT
BLADE EXCALIBUR 4.0X13 (MISCELLANEOUS) ×2 IMPLANT
BNDG ELASTIC 4X5.8 VLCR STR LF (GAUZE/BANDAGES/DRESSINGS) ×1 IMPLANT
BNDG ELASTIC 6X5.8 VLCR STR LF (GAUZE/BANDAGES/DRESSINGS) ×2 IMPLANT
BOOTCOVER CLEANROOM LRG (PROTECTIVE WEAR) ×4 IMPLANT
BUR OVAL 6.0 (BURR) IMPLANT
COVER SURGICAL LIGHT HANDLE (MISCELLANEOUS) ×2 IMPLANT
COVER WAND RF STERILE (DRAPES) ×2 IMPLANT
CUFF TOURN SGL QUICK 34 (TOURNIQUET CUFF)
CUFF TOURN SGL QUICK 42 (TOURNIQUET CUFF) IMPLANT
CUFF TRNQT CYL 34X4.125X (TOURNIQUET CUFF) IMPLANT
DRAPE ARTHROSCOPY W/POUCH 114 (DRAPES) ×2 IMPLANT
DRSG PAD ABDOMINAL 8X10 ST (GAUZE/BANDAGES/DRESSINGS) ×2 IMPLANT
DURAPREP 26ML APPLICATOR (WOUND CARE) ×2 IMPLANT
GAUZE SPONGE 4X4 12PLY STRL (GAUZE/BANDAGES/DRESSINGS) ×2 IMPLANT
GAUZE SPONGE 4X4 12PLY STRL LF (GAUZE/BANDAGES/DRESSINGS) ×1 IMPLANT
GAUZE XEROFORM 1X8 LF (GAUZE/BANDAGES/DRESSINGS) ×2 IMPLANT
GLOVE BIOGEL PI IND STRL 8 (GLOVE) ×1 IMPLANT
GLOVE BIOGEL PI INDICATOR 8 (GLOVE) ×1
GLOVE ORTHO TXT STRL SZ7.5 (GLOVE) ×2 IMPLANT
GOWN STRL REUS W/ TWL LRG LVL3 (GOWN DISPOSABLE) ×2 IMPLANT
GOWN STRL REUS W/TWL 2XL LVL3 (GOWN DISPOSABLE) ×1 IMPLANT
GOWN STRL REUS W/TWL LRG LVL3 (GOWN DISPOSABLE) ×4
KIT TURNOVER KIT B (KITS) ×2 IMPLANT
NDL HYPO 25GX1X1/2 BEV (NEEDLE) IMPLANT
NEEDLE HYPO 25GX1X1/2 BEV (NEEDLE) ×2 IMPLANT
PACK ARTHROSCOPY DSU (CUSTOM PROCEDURE TRAY) ×2 IMPLANT
PAD ABD 8X10 STRL (GAUZE/BANDAGES/DRESSINGS) ×1 IMPLANT
PAD ARMBOARD 7.5X6 YLW CONV (MISCELLANEOUS) ×4 IMPLANT
PAD CAST 4YDX4 CTTN HI CHSV (CAST SUPPLIES) ×1 IMPLANT
PADDING CAST COTTON 4X4 STRL (CAST SUPPLIES) ×2
PADDING CAST COTTON 6X4 STRL (CAST SUPPLIES) ×2 IMPLANT
SPONGE LAP 4X18 RFD (DISPOSABLE) ×2 IMPLANT
STRIP CLOSURE SKIN 1/2X4 (GAUZE/BANDAGES/DRESSINGS) ×2 IMPLANT
SUT ETHILON 3 0 PS 1 (SUTURE) ×1 IMPLANT
TOWEL GREEN STERILE FF (TOWEL DISPOSABLE) ×4 IMPLANT
TUBING ARTHROSCOPY IRRIG 16FT (MISCELLANEOUS) ×2 IMPLANT

## 2020-01-14 NOTE — Discharge Instructions (Signed)
Remove dressing 48 hours postop, shower and apply bandaids to incisions.  Begin range of motion exercises, but nothing aggressive.  Weight bear as tolerated and wean off crutches as comfort allows.  Elevate foot above heart level as much as possible.    No driving.

## 2020-01-14 NOTE — Transfer of Care (Signed)
Immediate Anesthesia Transfer of Care Note  Patient: Arthur Chase  Procedure(s) Performed: right knee arthroscopy partial medial meniscectomy, removal of loose body (Right Knee)  Patient Location: PACU  Anesthesia Type:General  Level of Consciousness: awake, alert  and oriented  Airway & Oxygen Therapy: Patient Spontanous Breathing and Patient connected to face mask oxygen  Post-op Assessment: Report given to RN and Post -op Vital signs reviewed and stable  Post vital signs: Reviewed and stable  Last Vitals:  Vitals Value Taken Time  BP 136/79 01/14/20 1317  Temp    Pulse 59 01/14/20 1319  Resp 18 01/14/20 1319  SpO2 99 % 01/14/20 1319  Vitals shown include unvalidated device data.  Last Pain:  Vitals:   01/14/20 0917  TempSrc:   PainSc: 2       Patients Stated Pain Goal: 2 (01/14/20 0917)  Complications: No complications documented.

## 2020-01-14 NOTE — Anesthesia Procedure Notes (Signed)
Procedure Name: LMA Insertion Date/Time: 01/14/2020 12:18 PM Performed by: Macie Burows, CRNA Pre-anesthesia Checklist: Patient identified, Emergency Drugs available, Suction available and Patient being monitored Patient Re-evaluated:Patient Re-evaluated prior to induction Oxygen Delivery Method: Circle system utilized Preoxygenation: Pre-oxygenation with 100% oxygen Induction Type: IV induction Ventilation: Mask ventilation without difficulty LMA: LMA inserted LMA Size: 5.0 Number of attempts: 1 Placement Confirmation: positive ETCO2 and breath sounds checked- equal and bilateral Tube secured with: Tape Dental Injury: Teeth and Oropharynx as per pre-operative assessment

## 2020-01-14 NOTE — Anesthesia Postprocedure Evaluation (Signed)
Anesthesia Post Note  Patient: Arthur Chase  Procedure(s) Performed: right knee arthroscopy partial medial meniscectomy, removal of loose body (Right Knee)     Patient location during evaluation: PACU Anesthesia Type: General Level of consciousness: sedated Pain management: pain level controlled Vital Signs Assessment: post-procedure vital signs reviewed and stable Respiratory status: spontaneous breathing and respiratory function stable Cardiovascular status: stable Postop Assessment: no apparent nausea or vomiting Anesthetic complications: no   No complications documented.  Last Vitals:  Vitals:   01/14/20 1402 01/14/20 1417  BP: (!) 132/96 (!) 130/96  Pulse: (!) 53 (!) 54  Resp: 16 16  Temp: (!) 36.1 C   SpO2: 94% 96%    Last Pain:  Vitals:   01/14/20 1402  TempSrc:   PainSc: 3                  Candra R Esma Kilts

## 2020-01-14 NOTE — Interval H&P Note (Signed)
History and Physical Interval Note:  01/14/2020 10:57 AM  Arthur Chase  has presented today for surgery, with the diagnosis of right knee medial meniscal tear.  The various methods of treatment have been discussed with the patient and family. After consideration of risks, benefits and other options for treatment, the patient has consented to  Procedure(s): right knee arthroscopy partial medial meniscectomy (Right) as a surgical intervention.  The patient's history has been reviewed, patient examined, no change in status, stable for surgery.  I have reviewed the patient's chart and labs.  Questions were answered to the patient's satisfaction.     Eldred Manges

## 2020-01-14 NOTE — Progress Notes (Signed)
Orthopedic Tech Progress Note Patient Details:  Arthur Chase 1955/07/03 [id number] I showed patient how to used them but I asked PACU RN to call me once patient is ready to go so I can see him used them  Ortho Devices Type of Ortho Device: Crutches Ortho Device/Splint Interventions: Adjustment, Application   Post Interventions Patient Tolerated: Well, Other (comment) Instructions Provided: Care of device   Donald Pore 01/14/2020, 2:20 PM

## 2020-01-14 NOTE — Op Note (Signed)
Preop diagnosis: Right knee repetitive locking with a suspected posterior medial meniscal tear.  Postop diagnosis: Same  Procedure: Right knee arthroscopy, removal of 5 mm round loose body, partial medial meniscectomy for complex posterior medial meniscal tear.  Surgeon: Annell Greening, MD  Assistant: Zonia Kief, PA-C  Anesthesia: LMA +20 cc Marcaine local  Brief history 64 year old male with pacemaker cannot get an MRI has had repetitive locking catching in his knee with instability.  X-rays demonstrated some arthritis.  He had pain primarily posterior medial and locking occurred at various times not predictable.  Procedure after induction of LMA airway anesthesia proximal leg holder well leg holder the opposite leg leg was prepped with DuraPrep down to the toes usual arthroscopic sheets drapes impervious stockinette Coban was applied timeout procedure was completed, no antibiotics are indicated.  Infant was placed for scope pump at 70.  Joint was sequentially image.  Suprapatellar pouch showed trochlear groove grade IV chondromalacia midportion of the groove.  There are some marginal osteophytes medial lateral.  No loose bodies in the gutters.  Medial thickened plica which was trimmed medially.  Anterior to the ACL was loose body 5 mm in size floating it was grasped with the pituitary with twisting motion finally was able be popped out of the joint.  Lateral compartment showed no significant wear and intact lateral meniscus.  Interpore's medial meniscus was intact medial compartment showed grade 3 with some areas of grade 4 wear on both sides of the joint and complex posterior meniscal tear with a subluxing fragment which was trimmed a small straight flat basket and the less aggressive shaver.  Remaining rim was probed was stable.  Knee was thoroughly lavaged.  Nylon in the portals after suction and a dry 3-0 nylon.  Postop soft dressing was applied and patient was transferred to care room in stable  condition.

## 2020-01-14 NOTE — H&P (Signed)
Patient: Arthur Chase                                          Date of Birth: [birth year]                                                      MRN: [id number] Visit Date: 12/25/2019                                                                     Requested by: Shon Hale, MD [address] [place],  [place] [address] PCP: Shon Hale, MD   Assessment & Plan: Visit Diagnoses:  1. Acute meniscal tear, medial, right, sequela     Plan: Patient had previous knee aspiration clear yellow fluid.  Intra-articular injection not effective.  Is not able to take anti-inflammatories due to his chronic Coumadin usage for his pacemaker defibrillator.  He is having mechanical catching in his knee with pain.  Not able get an MRI due to his pacemaker defibrillator.  We discussed options with him.  Option would be outpatient knee arthroscopy for partial meniscectomy for his repetitive locking and catching that occurs with ambulation now present x2 years.  He has been through exercise program, intermittent ice, topical creams, knee aspiration and intra-articular cortisone injection without relief.  Patient states that he is concerned he is going to fall and break something would like to proceed with outpatient arthroscopy.  Procedure discussed.  He would need to stop his Coumadin 5 days before then restarted after the procedure. Follow-Up Instructions: No follow-ups on file.   Orders:  No orders of the defined types were placed in this encounter.  No orders of the defined types were placed in this encounter.     Procedures: No procedures performed   Clinical Data: No additional findings.   Subjective:    Chief Complaint  Patient presents with  . Right Knee - Pain   HPI:  64 year old male with persistent problems with his right knee now for 2 years.  Injection last month gave him relief for 1 to 2 weeks with pain relief but he still has had catching in his  knee and pain along the medial joint line with recurrent effusion.  He states when he is walking at times his knee will grab catch she has to stop wiggle his knee and is not able to walk until he gets better.  He has trouble sleeping at night and when he turns over his knee wakes him up and sometimes it is stuck and is not able to extend it.  When this occurs he states he has pain along the medial joint line.  He has to go up and down stairs grabbing the rail and goes down the right leg first keeping it locked and straight states if he tries to bend it sometimes it will catch and he is concerned he will fall and has had to grab and hang onto the rail  to prevent this.  Patient has a pacemaker defibrillator and is on Coumadin.  He stopped it 5 days from before his procedure and then resumes it after the procedure in the past.  Ejection fraction in the past prior to treatment was below 30%.  Last echo 10/08/2019 showed ejection fraction 55 to 60%.  Positive for hypertension atrial fib.  History of cardiac arrest 2017.  Positive for Covid 19.  Heparin-induced thrombocytopenia.  Hypertension.   Review of Systems all other systems are negative as pertains HPI.   Objective: Vital Signs: BP 117/82   Pulse 67   Physical Exam Constitutional:      Appearance: He is well-developed.  HENT:     Head: Normocephalic and atraumatic.  Eyes:     Pupils: Pupils are equal, round, and reactive to light.  Neck:     Thyroid: No thyromegaly.     Trachea: No tracheal deviation.  Cardiovascular:     Rate and Rhythm: Normal rate.  Pulmonary:     Effort: Pulmonary effort is normal.     Breath sounds: No wheezing.  Abdominal:     General: Bowel sounds are normal.     Palpations: Abdomen is soft.  Skin:    General: Skin is warm and dry.     Capillary Refill: Capillary refill takes less than 2 seconds.  Neurological:     Mental Status: He is alert and oriented to person, place, and time.  Psychiatric:         Behavior: Behavior normal.        Thought Content: Thought content normal.        Judgment: Judgment normal.     Ortho Exam patient has mild knee effusion.  Medial joint line tenderness pain with hyperextension on the medial joint line.  Some discomfort with patellofemoral loading quadriceps contracture negative patella subluxation.  ACL PCL collateral ligament exam is normal.  Has palpable distal pulse.  No knee effusion left knee.  Specialty Comments:  No specialty comments available.  Imaging: No results found.   PMFS History:     Patient Active Problem List   Diagnosis Date Noted  . Acute meniscal tear, medial, right, sequela 12/25/2019  . Synovitis of right knee 11/20/2019  . Pain in right knee 10/29/2019  . Secondary hypercoagulable state (HCC) 10/24/2019  . Acute hypoxemic respiratory failure due to COVID-19 (HCC) 10/06/2019  . Obesity   . NICM (nonischemic cardiomyopathy) (HCC)   . HIT (heparin-induced thrombocytopenia) (HCC)   . Cardiac arrest with ventricular fibrillation (HCC)   . HTN (hypertension)   . ICD (implantable cardioverter-defibrillator) in place   . Paroxysmal atrial fibrillation (HCC)   . Acute systolic CHF (congestive heart failure) (HCC) 11/25/2015  . Hypokalemia 11/25/2015  . NSVT (nonsustained ventricular tachycardia) (HCC) 11/25/2015  . Atrial fibrillation with rapid ventricular response (HCC) 11/20/2015  . Cardiogenic shock (HCC)   . Acute respiratory failure with hypoxemia (HCC) 11/16/2015  . Anoxic encephalopathy (HCC) 10/21/2015       Past Medical History:  Diagnosis Date  . Anoxic encephalopathy (HCC) 10/2015   a. 10/2015 in setting of cardiac arrest - recovered prior to DC.  . Cardiac arrest with ventricular fibrillation (HCC)    a. 11/2015: Unclear etiology. Nl cors on cath 8/27. S/P cooling. On amiodarone and s/p ICD for secondary prevention on 12/01/15. EF down to 25-30% but normalized to 68 by subsequent cardiac MR   . COVID-19 10/06/2019  . HIT (heparin-induced thrombocytopenia) (HCC)    a. 11/2015  during admission for VF arrest. HIT panel positive. Placed on coumadin for 2 months  . HTN (hypertension)   . Hypokalemia    a. 11/2015: resolved on K supplementation and spironolactone  . ICD (implantable cardioverter-defibrillator) in place    a. Medtronic Visia AF MRI (serial  Number [id number]) ICD.  Marland Kitchen Myocardial stunning    a. 11/2015: EF down to 20-25% after VF arrest but improved to ~68 by subsequent cardiac MR.  Marland Kitchen NICM (nonischemic cardiomyopathy) (HCC)   . Obesity   . PAF (paroxysmal atrial fibrillation) (HCC)    a. 11/2015 brief run of afib with RVR while intubated during a complicated admission for VF arrest and VDRF. No recurrence on amio. CHADSVASC 2 (CHF, HTN) and on coumadin for HIT with positive thrombotic markers. If no long term recurrence, he may be able to come off OAC  . Ventricular tachycardia (HCC) 10/2015         Family History  Problem Relation Age of Onset  . Heart attack Mother   . Alzheimer's disease Father          Past Surgical History:  Procedure Laterality Date  . CARDIAC CATHETERIZATION N/A 11/16/2015   Procedure: Left Heart Cath and Coronary Angiography;  Surgeon: Peter M Swaziland, MD;  Location: [facility] INVASIVE CV LAB;  Service: Cardiovascular;  Laterality: N/A;  . EP IMPLANTABLE DEVICE N/A 12/01/2015   Procedure: ICD Implant;  Surgeon: Will [name], MD;  Location: [facility] INVASIVE CV LAB;  Service: Cardiovascular;  Laterality: N/A;   Social History        Occupational History  . Occupation: Engineer, site  Tobacco Use  . Smoking status: Former Smoker    Types: Cigarettes    Quit date: 1995    Years since quitting: 26.7  . Smokeless tobacco: Never Used  Vaping Use  . Vaping Use: Never used  Substance and Sexual Activity  . Alcohol use: No    Comment: quit at age 71  . Drug use: No  . Sexual activity: Not on file

## 2020-01-15 ENCOUNTER — Encounter (HOSPITAL_COMMUNITY): Payer: Self-pay | Admitting: Orthopaedic Surgery

## 2020-01-17 ENCOUNTER — Telehealth: Payer: Self-pay | Admitting: Orthopaedic Surgery

## 2020-01-17 NOTE — Telephone Encounter (Signed)
FMLA form received from [employer]. Sent to [employer].

## 2020-01-21 ENCOUNTER — Other Ambulatory Visit: Payer: Self-pay | Admitting: Cardiovascular Disease

## 2020-01-22 ENCOUNTER — Ambulatory Visit (INDEPENDENT_AMBULATORY_CARE_PROVIDER_SITE_OTHER): Payer: No Typology Code available for payment source | Admitting: Orthopaedic Surgery

## 2020-01-22 ENCOUNTER — Other Ambulatory Visit: Payer: Self-pay

## 2020-01-22 ENCOUNTER — Encounter: Payer: Self-pay | Admitting: Orthopaedic Surgery

## 2020-01-22 VITALS — BP 124/83 | HR 61 | Ht 71.0 in | Wt 234.0 lb

## 2020-01-22 DIAGNOSIS — M659 Synovitis and tenosynovitis, unspecified: Secondary | ICD-10-CM

## 2020-01-22 NOTE — Telephone Encounter (Signed)
Pt's pharmacy is requesting a refill on pantoprazole. Would Dr. Cooper like to refill this medication? Please address 

## 2020-01-22 NOTE — Progress Notes (Signed)
Post-Op Visit Note   Patient: Arthur Chase           Date of Birth: [birth year]           MRN: [id number] Visit Date: 01/22/2020 PCP: Shon Hale, MD   Assessment & Plan: 64 year old male returns post knee arthroscopy with removal loose bodies.  He did have some grade 4 changes and torn meniscus which was trimmed.  Suture removed today swelling is down he is on chronic blood thinner.  Work slip given no work until 01/28/2020 with a restriction of no climbing no prolonged standing x2 weeks.  I plan to recheck him in 5 weeks.  Patient is happy with the progress he is made from surgery.  Chief Complaint:  Chief Complaint  Patient presents with  . Right Knee - Routine Post Op    01/14/2020 right knee arthroscopy, PMM, removal of loose bodies   Visit Diagnoses:  1. Synovitis of right knee       Postop knee arthroscopy with removal loose body and chondroplasty as well as debridement posterior medial meniscus.  Plan: As above.  Follow-Up Instructions: Return in about 5 weeks (around 02/26/2020).   Orders:  No orders of the defined types were placed in this encounter.  No orders of the defined types were placed in this encounter.   Imaging: No results found.  PMFS History: Patient Active Problem List   Diagnosis Date Noted  . Acute meniscal tear, medial, right, sequela 12/25/2019  . Synovitis of right knee 11/20/2019  . Pain in right knee 10/29/2019  . Secondary hypercoagulable state (HCC) 10/24/2019  . Acute hypoxemic respiratory failure due to COVID-19 (HCC) 10/06/2019  . Obesity   . NICM (nonischemic cardiomyopathy) (HCC)   . HIT (heparin-induced thrombocytopenia) (HCC)   . Cardiac arrest with ventricular fibrillation (HCC)   . HTN (hypertension)   . ICD (implantable cardioverter-defibrillator) in place   . Paroxysmal atrial fibrillation (HCC)   . Acute systolic CHF (congestive heart failure) (HCC) 11/25/2015  . Hypokalemia 11/25/2015  . NSVT (nonsustained  ventricular tachycardia) (HCC) 11/25/2015  . Atrial fibrillation with rapid ventricular response (HCC) 11/20/2015  . Cardiogenic shock (HCC)   . Acute respiratory failure with hypoxemia (HCC) 11/16/2015  . Anoxic encephalopathy (HCC) 10/21/2015   Past Medical History:  Diagnosis Date  . AICD (automatic cardioverter/defibrillator) present   . Anoxic encephalopathy (HCC) 10/2015   a. 10/2015 in setting of cardiac arrest - recovered prior to DC.  . Cardiac arrest with ventricular fibrillation (HCC)    a. 11/2015: Unclear etiology. Nl cors on cath 8/27. S/P cooling. On amiodarone and s/p ICD for secondary prevention on 12/01/15. EF down to 25-30% but normalized to 68 by subsequent cardiac MR  . COVID-19 10/06/2019  . HIT (heparin-induced thrombocytopenia) (HCC)    a. 11/2015 during admission for VF arrest. HIT panel positive. Placed on coumadin for 2 months  . HTN (hypertension)   . Hypokalemia    a. 11/2015: resolved on K supplementation and spironolactone  . Hypothyroidism   . ICD (implantable cardioverter-defibrillator) in place    a. Medtronic Visia AF MRI (serial  Number [id number]) ICD.  Marland Kitchen Myocardial stunning    a. 11/2015: EF down to 20-25% after VF arrest but improved to ~68 by subsequent cardiac MR.  Marland Kitchen NICM (nonischemic cardiomyopathy) (HCC)   . Obesity   . PAF (paroxysmal atrial fibrillation) (HCC)    a. 11/2015 brief run of afib with RVR while intubated during a complicated admission for  VF arrest and VDRF. No recurrence on amio. CHADSVASC 2 (CHF, HTN) and on coumadin for HIT with positive thrombotic markers. If no long term recurrence, he may be able to come off OAC  . Ventricular tachycardia (HCC) 10/2015    Family History  Problem Relation Age of Onset  . Heart attack Mother   . Alzheimer's disease Father     Past Surgical History:  Procedure Laterality Date  . CARDIAC CATHETERIZATION N/A 11/16/2015   Procedure: Left Heart Cath and Coronary Angiography;  Surgeon: Peter M  Swaziland, MD;  Location: [facility] INVASIVE CV LAB;  Service: Cardiovascular;  Laterality: N/A;  . EP IMPLANTABLE DEVICE N/A 12/01/2015   Procedure: ICD Implant;  Surgeon: Will [name], MD;  Location: [facility] INVASIVE CV LAB;  Service: Cardiovascular;  Laterality: N/A;  . KNEE ARTHROSCOPY Right 01/14/2020   Procedure: right knee arthroscopy partial medial meniscectomy, removal of loose body;  Surgeon: Eldred Manges, MD;  Location: [facility] OR;  Service: Orthopedics;  Laterality: Right;   Social History   Occupational History  . Occupation: Engineer, site  Tobacco Use  . Smoking status: Former Smoker    Types: Cigarettes    Quit date: 1995    Years since quitting: 26.8  . Smokeless tobacco: Never Used  Vaping Use  . Vaping Use: Never used  Substance and Sexual Activity  . Alcohol use: No    Comment: quit at age 70  . Drug use: No  . Sexual activity: Not on file

## 2020-01-24 ENCOUNTER — Other Ambulatory Visit: Payer: Self-pay

## 2020-01-24 ENCOUNTER — Ambulatory Visit (INDEPENDENT_AMBULATORY_CARE_PROVIDER_SITE_OTHER): Payer: No Typology Code available for payment source | Admitting: *Deleted

## 2020-01-24 DIAGNOSIS — D75829 Heparin-induced thrombocytopenia, unspecified: Secondary | ICD-10-CM

## 2020-01-24 DIAGNOSIS — D7582 Heparin induced thrombocytopenia (HIT): Secondary | ICD-10-CM

## 2020-01-24 DIAGNOSIS — I4891 Unspecified atrial fibrillation: Secondary | ICD-10-CM

## 2020-01-24 LAB — POCT INR: INR: 1.6 — AB (ref 2.0–3.0)

## 2020-01-24 NOTE — Patient Instructions (Signed)
Description   Take 2 tablets today and 1.5 tablets tomorrow, then continue taking 7.5mg  daily except 5mg  on Mondays, Wednesdays and Fridays. Recheck INR in 1.5 weeks. Call coumadin clinic [phone]

## 2020-01-27 ENCOUNTER — Other Ambulatory Visit: Payer: Self-pay | Admitting: Cardiology

## 2020-01-29 ENCOUNTER — Other Ambulatory Visit: Payer: Self-pay | Admitting: Cardiology

## 2020-02-04 ENCOUNTER — Ambulatory Visit (INDEPENDENT_AMBULATORY_CARE_PROVIDER_SITE_OTHER): Payer: No Typology Code available for payment source | Admitting: *Deleted

## 2020-02-04 ENCOUNTER — Other Ambulatory Visit: Payer: Self-pay

## 2020-02-04 DIAGNOSIS — I4891 Unspecified atrial fibrillation: Secondary | ICD-10-CM

## 2020-02-04 DIAGNOSIS — D7582 Heparin induced thrombocytopenia (HIT): Secondary | ICD-10-CM

## 2020-02-04 DIAGNOSIS — Z5181 Encounter for therapeutic drug level monitoring: Secondary | ICD-10-CM | POA: Diagnosis not present

## 2020-02-04 DIAGNOSIS — D75829 Heparin-induced thrombocytopenia, unspecified: Secondary | ICD-10-CM

## 2020-02-04 LAB — POCT INR: INR: 1.9 — AB (ref 2.0–3.0)

## 2020-02-04 NOTE — Patient Instructions (Signed)
Description   Take 7.5mg s today, then start taking 7.5mg  daily except 5mg  on Mondays and Fridays. Recheck INR in 2 weeks. Call coumadin clinic [phone]

## 2020-02-06 ENCOUNTER — Ambulatory Visit (INDEPENDENT_AMBULATORY_CARE_PROVIDER_SITE_OTHER): Payer: No Typology Code available for payment source

## 2020-02-06 DIAGNOSIS — I4901 Ventricular fibrillation: Secondary | ICD-10-CM | POA: Diagnosis not present

## 2020-02-06 LAB — CUP PACEART REMOTE DEVICE CHECK
Battery Remaining Longevity: 90 mo
Battery Voltage: 3 V
Brady Statistic RV Percent Paced: 0.15 %
Date Time Interrogation Session: 20211117043623
HighPow Impedance: 78 Ohm
Implantable Lead Implant Date: 20170911
Implantable Lead Location: 753860
Implantable Pulse Generator Implant Date: 20170911
Lead Channel Impedance Value: 342 Ohm
Lead Channel Impedance Value: 437 Ohm
Lead Channel Pacing Threshold Amplitude: 0.625 V
Lead Channel Pacing Threshold Pulse Width: 0.4 ms
Lead Channel Sensing Intrinsic Amplitude: 9.875 mV
Lead Channel Sensing Intrinsic Amplitude: 9.875 mV
Lead Channel Setting Pacing Amplitude: 2.5 V
Lead Channel Setting Pacing Pulse Width: 0.4 ms
Lead Channel Setting Sensing Sensitivity: 0.3 mV

## 2020-02-07 NOTE — Progress Notes (Signed)
Remote ICD transmission.   

## 2020-02-22 ENCOUNTER — Ambulatory Visit (INDEPENDENT_AMBULATORY_CARE_PROVIDER_SITE_OTHER): Payer: No Typology Code available for payment source | Admitting: *Deleted

## 2020-02-22 ENCOUNTER — Other Ambulatory Visit: Payer: Self-pay

## 2020-02-22 DIAGNOSIS — D7582 Heparin induced thrombocytopenia (HIT): Secondary | ICD-10-CM | POA: Diagnosis not present

## 2020-02-22 DIAGNOSIS — I4891 Unspecified atrial fibrillation: Secondary | ICD-10-CM

## 2020-02-22 DIAGNOSIS — D75829 Heparin-induced thrombocytopenia, unspecified: Secondary | ICD-10-CM

## 2020-02-22 DIAGNOSIS — Z5181 Encounter for therapeutic drug level monitoring: Secondary | ICD-10-CM

## 2020-02-22 LAB — POCT INR: INR: 2.2 (ref 2.0–3.0)

## 2020-02-22 NOTE — Patient Instructions (Signed)
Description   Continue taking Warfarin 7.5mg  daily except 5mg  on Mondays and Fridays. Recheck INR in 3 weeks. Call coumadin clinic [phone]

## 2020-02-26 ENCOUNTER — Ambulatory Visit (INDEPENDENT_AMBULATORY_CARE_PROVIDER_SITE_OTHER): Payer: No Typology Code available for payment source | Admitting: Orthopaedic Surgery

## 2020-02-26 ENCOUNTER — Encounter: Payer: Self-pay | Admitting: Orthopaedic Surgery

## 2020-02-26 VITALS — BP 134/88 | HR 61 | Ht 71.0 in | Wt 234.0 lb

## 2020-02-26 DIAGNOSIS — S83241S Other tear of medial meniscus, current injury, right knee, sequela: Secondary | ICD-10-CM

## 2020-02-26 NOTE — Progress Notes (Signed)
Post-Op Visit Note   Patient: Arthur Chase           Date of Birth: [birth year]           MRN: [id number] Visit Date: 02/26/2020 PCP: Shon Hale, MD   Assessment & Plan: Follow-up knee arthroscopy with removal loose bodies.  He did have some grade 4 change as well as torn meniscus which was trimmed.  Swelling is down he has been on chemical prophylaxis for history of cardiac arrhythmias.  He is happy the surgical result he hangs onto the rail when he goes down the stairs but able to go up stairs rapidly he is walking without a limp and is gotten good relief of preop symptoms for the most part.  Chief Complaint:  Chief Complaint  Patient presents with  . Right Knee - Follow-up    01/14/2020 right knee arthroscopy, PMM, removal of loose bodies   Visit Diagnoses:  1. Acute meniscal tear, medial, right, sequela     Plan: Follow-up as needed.  Follow-Up Instructions: No follow-ups on file.   Orders:  No orders of the defined types were placed in this encounter.  No orders of the defined types were placed in this encounter.   Imaging: No results found.  PMFS History: Patient Active Problem List   Diagnosis Date Noted  . Acute meniscal tear, medial, right, sequela 12/25/2019  . Synovitis of right knee 11/20/2019  . Pain in right knee 10/29/2019  . Secondary hypercoagulable state (HCC) 10/24/2019  . Acute hypoxemic respiratory failure due to COVID-19 (HCC) 10/06/2019  . Obesity   . NICM (nonischemic cardiomyopathy) (HCC)   . HIT (heparin-induced thrombocytopenia) (HCC)   . Cardiac arrest with ventricular fibrillation (HCC)   . HTN (hypertension)   . ICD (implantable cardioverter-defibrillator) in place   . Paroxysmal atrial fibrillation (HCC)   . Acute systolic CHF (congestive heart failure) (HCC) 11/25/2015  . Hypokalemia 11/25/2015  . NSVT (nonsustained ventricular tachycardia) (HCC) 11/25/2015  . Atrial fibrillation with rapid ventricular response (HCC)  11/20/2015  . Cardiogenic shock (HCC)   . Acute respiratory failure with hypoxemia (HCC) 11/16/2015  . Anoxic encephalopathy (HCC) 10/21/2015   Past Medical History:  Diagnosis Date  . AICD (automatic cardioverter/defibrillator) present   . Anoxic encephalopathy (HCC) 10/2015   a. 10/2015 in setting of cardiac arrest - recovered prior to DC.  . Cardiac arrest with ventricular fibrillation (HCC)    a. 11/2015: Unclear etiology. Nl cors on cath 8/27. S/P cooling. On amiodarone and s/p ICD for secondary prevention on 12/01/15. EF down to 25-30% but normalized to 68 by subsequent cardiac MR  . COVID-19 10/06/2019  . HIT (heparin-induced thrombocytopenia) (HCC)    a. 11/2015 during admission for VF arrest. HIT panel positive. Placed on coumadin for 2 months  . HTN (hypertension)   . Hypokalemia    a. 11/2015: resolved on K supplementation and spironolactone  . Hypothyroidism   . ICD (implantable cardioverter-defibrillator) in place    a. Medtronic Visia AF MRI (serial  Number [id number]) ICD.  Marland Kitchen Myocardial stunning    a. 11/2015: EF down to 20-25% after VF arrest but improved to ~68 by subsequent cardiac MR.  Marland Kitchen NICM (nonischemic cardiomyopathy) (HCC)   . Obesity   . PAF (paroxysmal atrial fibrillation) (HCC)    a. 11/2015 brief run of afib with RVR while intubated during a complicated admission for VF arrest and VDRF. No recurrence on amio. CHADSVASC 2 (CHF, HTN) and on coumadin for HIT  with positive thrombotic markers. If no long term recurrence, he may be able to come off OAC  . Ventricular tachycardia (HCC) 10/2015    Family History  Problem Relation Age of Onset  . Heart attack Mother   . Alzheimer's disease Father     Past Surgical History:  Procedure Laterality Date  . CARDIAC CATHETERIZATION N/A 11/16/2015   Procedure: Left Heart Cath and Coronary Angiography;  Surgeon: Peter M Swaziland, MD;  Location: [facility] INVASIVE CV LAB;  Service: Cardiovascular;  Laterality: N/A;  . EP IMPLANTABLE  DEVICE N/A 12/01/2015   Procedure: ICD Implant;  Surgeon: Will [name], MD;  Location: [facility] INVASIVE CV LAB;  Service: Cardiovascular;  Laterality: N/A;  . KNEE ARTHROSCOPY Right 01/14/2020   Procedure: right knee arthroscopy partial medial meniscectomy, removal of loose body;  Surgeon: Eldred Manges, MD;  Location: [facility] OR;  Service: Orthopedics;  Laterality: Right;   Social History   Occupational History  . Occupation: Engineer, site  Tobacco Use  . Smoking status: Former Smoker    Types: Cigarettes    Quit date: 1995    Years since quitting: 26.9  . Smokeless tobacco: Never Used  Vaping Use  . Vaping Use: Never used  Substance and Sexual Activity  . Alcohol use: No    Comment: quit at age 22  . Drug use: No  . Sexual activity: Not on file

## 2020-03-13 ENCOUNTER — Ambulatory Visit (INDEPENDENT_AMBULATORY_CARE_PROVIDER_SITE_OTHER): Payer: No Typology Code available for payment source | Admitting: *Deleted

## 2020-03-13 ENCOUNTER — Other Ambulatory Visit: Payer: Self-pay

## 2020-03-13 DIAGNOSIS — D7582 Heparin induced thrombocytopenia (HIT): Secondary | ICD-10-CM

## 2020-03-13 DIAGNOSIS — Z5181 Encounter for therapeutic drug level monitoring: Secondary | ICD-10-CM | POA: Diagnosis not present

## 2020-03-13 DIAGNOSIS — D75829 Heparin-induced thrombocytopenia, unspecified: Secondary | ICD-10-CM

## 2020-03-13 DIAGNOSIS — I4891 Unspecified atrial fibrillation: Secondary | ICD-10-CM

## 2020-03-13 LAB — POCT INR: INR: 2 (ref 2.0–3.0)

## 2020-03-13 NOTE — Patient Instructions (Signed)
Description   Continue taking Warfarin 7.5mg  daily except 5mg  on Mondays and Fridays. Recheck INR in 4 weeks. Call coumadin clinic [phone]

## 2020-04-10 ENCOUNTER — Ambulatory Visit (INDEPENDENT_AMBULATORY_CARE_PROVIDER_SITE_OTHER): Payer: No Typology Code available for payment source | Admitting: *Deleted

## 2020-04-10 ENCOUNTER — Other Ambulatory Visit: Payer: Self-pay

## 2020-04-10 DIAGNOSIS — I4891 Unspecified atrial fibrillation: Secondary | ICD-10-CM | POA: Diagnosis not present

## 2020-04-10 DIAGNOSIS — D7582 Heparin induced thrombocytopenia (HIT): Secondary | ICD-10-CM

## 2020-04-10 DIAGNOSIS — D75829 Heparin-induced thrombocytopenia, unspecified: Secondary | ICD-10-CM

## 2020-04-10 DIAGNOSIS — Z5181 Encounter for therapeutic drug level monitoring: Secondary | ICD-10-CM | POA: Diagnosis not present

## 2020-04-10 LAB — POCT INR: INR: 2.1 (ref 2.0–3.0)

## 2020-04-10 NOTE — Patient Instructions (Signed)
Description   Continue taking Warfarin 7.5mg  daily except 5mg  on Mondays and Fridays. Recheck INR in 5 weeks. Call coumadin clinic [phone]

## 2020-04-21 ENCOUNTER — Other Ambulatory Visit: Payer: Self-pay | Admitting: Cardiology

## 2020-05-07 ENCOUNTER — Ambulatory Visit (INDEPENDENT_AMBULATORY_CARE_PROVIDER_SITE_OTHER): Payer: No Typology Code available for payment source

## 2020-05-07 DIAGNOSIS — I428 Other cardiomyopathies: Secondary | ICD-10-CM

## 2020-05-07 LAB — CUP PACEART REMOTE DEVICE CHECK
Battery Remaining Longevity: 95 mo
Battery Voltage: 3 V
Brady Statistic RV Percent Paced: 0.06 %
Date Time Interrogation Session: 20220216033523
HighPow Impedance: 79 Ohm
Implantable Lead Implant Date: 20170911
Implantable Lead Location: 753860
Implantable Pulse Generator Implant Date: 20170911
Lead Channel Impedance Value: 342 Ohm
Lead Channel Impedance Value: 437 Ohm
Lead Channel Pacing Threshold Amplitude: 0.625 V
Lead Channel Pacing Threshold Pulse Width: 0.4 ms
Lead Channel Sensing Intrinsic Amplitude: 10.5 mV
Lead Channel Sensing Intrinsic Amplitude: 10.5 mV
Lead Channel Setting Pacing Amplitude: 2.5 V
Lead Channel Setting Pacing Pulse Width: 0.4 ms
Lead Channel Setting Sensing Sensitivity: 0.3 mV

## 2020-05-14 NOTE — Progress Notes (Signed)
Remote ICD transmission.   

## 2020-05-15 ENCOUNTER — Other Ambulatory Visit: Payer: Self-pay

## 2020-05-15 ENCOUNTER — Ambulatory Visit (INDEPENDENT_AMBULATORY_CARE_PROVIDER_SITE_OTHER): Payer: No Typology Code available for payment source | Admitting: *Deleted

## 2020-05-15 DIAGNOSIS — I4891 Unspecified atrial fibrillation: Secondary | ICD-10-CM

## 2020-05-15 DIAGNOSIS — D75829 Heparin-induced thrombocytopenia, unspecified: Secondary | ICD-10-CM

## 2020-05-15 DIAGNOSIS — Z5181 Encounter for therapeutic drug level monitoring: Secondary | ICD-10-CM

## 2020-05-15 DIAGNOSIS — D7582 Heparin induced thrombocytopenia (HIT): Secondary | ICD-10-CM

## 2020-05-15 LAB — POCT INR: INR: 2.1 (ref 2.0–3.0)

## 2020-05-15 NOTE — Patient Instructions (Signed)
Description   Continue taking Warfarin 7.5mg daily except 5mg on Mondays and Fridays. Recheck INR in 6 weeks. Call coumadin clinic [phone]     

## 2020-05-30 ENCOUNTER — Other Ambulatory Visit: Payer: Self-pay

## 2020-05-30 ENCOUNTER — Ambulatory Visit (INDEPENDENT_AMBULATORY_CARE_PROVIDER_SITE_OTHER): Payer: No Typology Code available for payment source | Admitting: Cardiology

## 2020-05-30 ENCOUNTER — Encounter: Payer: Self-pay | Admitting: Cardiology

## 2020-05-30 VITALS — BP 110/70 | HR 61 | Ht 71.0 in | Wt 238.0 lb

## 2020-05-30 DIAGNOSIS — I472 Ventricular tachycardia, unspecified: Secondary | ICD-10-CM

## 2020-05-30 NOTE — Progress Notes (Signed)
Electrophysiology Office Note   Date:  05/30/2020   ID:  KYIAN OBST, DOB [birth year], MRN [id number]  PCP:  Shon Hale, MD  Cardiologist:  Excell Seltzer Primary Electrophysiologist:  Skylur Fuston [name], MD    No chief complaint on file.    History of Present Illness: Arthur Chase is a 65 y.o. male who presents today for electrophysiology evaluation.     He has a history of obesity, hypertension, cardiac arrest October 2017 complicated by anoxic encephalopathy, cardiogenic shock, acute systolic heart failure, VT/VF, heparin-induced thrombocytopenia, hypokalemia, atrial fibrillation.  He is status post Medtronic ICD 12/01/2015.  He was admitted 11/16/2015 with cardiac arrest.  EMS strips demonstrated VT/VF with a cycle length around 280 ms.  He was successfully resuscitated and underwent a prolonged hospital admission with cooling.  Today, denies symptoms of palpitations, chest pain, shortness of breath, orthopnea, PND, lower extremity edema, claudication, dizziness, presyncope, syncope, bleeding, or neurologic sequela. The patient is tolerating medications without difficulties.  Currently feeling well.  He has no chest pain or shortness of breath.  Is able do all of his daily activities.  He has noted no further issues.  He is currently feeling well.  He is excited as he just retired.  Past Medical History:  Diagnosis Date  . AICD (automatic cardioverter/defibrillator) present   . Anoxic encephalopathy (HCC) 10/2015   a. 10/2015 in setting of cardiac arrest - recovered prior to DC.  . Cardiac arrest with ventricular fibrillation (HCC)    a. 11/2015: Unclear etiology. Nl cors on cath 8/27. S/P cooling. On amiodarone and s/p ICD for secondary prevention on 12/01/15. EF down to 25-30% but normalized to 68 by subsequent cardiac MR  . COVID-19 10/06/2019  . HIT (heparin-induced thrombocytopenia) (HCC)    a. 11/2015 during admission for VF arrest. HIT panel positive. Placed on  coumadin for 2 months  . HTN (hypertension)   . Hypokalemia    a. 11/2015: resolved on K supplementation and spironolactone  . Hypothyroidism   . ICD (implantable cardioverter-defibrillator) in place    a. Medtronic Visia AF MRI (serial  Number [id number]) ICD.  Marland Kitchen Myocardial stunning    a. 11/2015: EF down to 20-25% after VF arrest but improved to ~68 by subsequent cardiac MR.  Marland Kitchen NICM (nonischemic cardiomyopathy) (HCC)   . Obesity   . PAF (paroxysmal atrial fibrillation) (HCC)    a. 11/2015 brief run of afib with RVR while intubated during a complicated admission for VF arrest and VDRF. No recurrence on amio. CHADSVASC 2 (CHF, HTN) and on coumadin for HIT with positive thrombotic markers. If no long term recurrence, he may be able to come off OAC  . Ventricular tachycardia (HCC) 10/2015   Past Surgical History:  Procedure Laterality Date  . CARDIAC CATHETERIZATION N/A 11/16/2015   Procedure: Left Heart Cath and Coronary Angiography;  Surgeon: Peter M Swaziland, MD;  Location: [facility] INVASIVE CV LAB;  Service: Cardiovascular;  Laterality: N/A;  . EP IMPLANTABLE DEVICE N/A 12/01/2015   Procedure: ICD Implant;  Surgeon: Massiel Stipp [name], MD;  Location: [facility] INVASIVE CV LAB;  Service: Cardiovascular;  Laterality: N/A;  . KNEE ARTHROSCOPY Right 01/14/2020   Procedure: right knee arthroscopy partial medial meniscectomy, removal of loose body;  Surgeon: Eldred Manges, MD;  Location: [facility] OR;  Service: Orthopedics;  Laterality: Right;     Current Outpatient Medications  Medication Sig Dispense Refill  . albuterol (VENTOLIN HFA) 108 (90 Base) MCG/ACT inhaler Inhale 2 puffs into the lungs  every 6 (six) hours as needed for wheezing or shortness of breath. 6.7 g 0  . atorvastatin (LIPITOR) 10 MG tablet Take 10 mg by mouth daily.    . fluticasone (FLONASE) 50 MCG/ACT nasal spray Place 1 spray into both nostrils daily.    Marland Kitchen HYDROcodone-acetaminophen (NORCO) 7.5-325 MG tablet Take 1 tablet by mouth every 6 (six)  hours as needed for moderate pain. 40 tablet 0  . levothyroxine (SYNTHROID) 75 MCG tablet Take 75 mcg by mouth daily before breakfast.     . losartan (COZAAR) 25 MG tablet Take 0.5 tablets (12.5 mg total) by mouth daily. 45 tablet 3  . metoprolol succinate (TOPROL-XL) 100 MG 24 hr tablet TAKE 1 TABLET BY MOUTH EVERY DAY 90 tablet 1  . mexiletine (MEXITIL) 200 MG capsule TAKE 1 CAPSULE BY MOUTH TWICE A DAY 180 capsule 2  . Multiple Vitamin (MULTIVITAMIN WITH MINERALS) TABS tablet Take 1 tablet by mouth daily. Centrum Silver    . pantoprazole (PROTONIX) 40 MG tablet Take 1 tablet (40 mg total) by mouth daily. 90 tablet 3  . potassium chloride SA (KLOR-CON M20) 20 MEQ tablet TAKE 1 TABLET BY MOUTH TWICE A DAY 180 tablet 3  . spironolactone (ALDACTONE) 25 MG tablet TAKE 1 TABLET BY MOUTH EVERY DAY 90 tablet 2  . warfarin (COUMADIN) 5 MG tablet TAKE 1 TO 1 AND 1/2 TABLETS BY MOUTH EVERY DAY AS DIRECTED BY COUMADIN CLINIC 135 tablet 1   No current facility-administered medications for this visit.    Allergies:   Heparin   Social History:  The patient  reports that he quit smoking about 27 years ago. His smoking use included cigarettes. He has never used smokeless tobacco. He reports that he does not drink alcohol and does not use drugs.   Family History:  The patient's family history includes Alzheimer's disease in his father; Heart attack in his mother.   ROS:  Please see the history of present illness.   Otherwise, review of systems is positive for none.   All other systems are reviewed and negative.   PHYSICAL EXAM: VS:  BP 110/70   Pulse 61   Ht 5\' 11"  (1.803 m)   Wt 238 lb (108 kg)   SpO2 93%   BMI 33.19 kg/m  , BMI Body mass index is 33.19 kg/m. GEN: Well nourished, well developed, in no acute distress  HEENT: normal  Neck: no JVD, carotid bruits, or masses Cardiac: RRR; no murmurs, rubs, or gallops,no edema  Respiratory:  clear to auscultation bilaterally, normal work of  breathing GI: soft, nontender, nondistended, + BS MS: no deformity or atrophy  Skin: warm and dry, device site well healed Neuro:  Strength and sensation are intact Psych: euthymic mood, full affect  EKG:  EKG is ordered today. Personal review of the ekg ordered shows sinus rhythm, rate 61  Personal review of the device interrogation today. Results in [facility]   Recent Labs: 10/07/2019: TSH 1.851 10/10/2019: ALT 24; B Natriuretic Peptide 140.1; Magnesium 2.3 01/14/2020: BUN 14; Creatinine, Ser 0.70; Hemoglobin 14.7; Platelets 193; Potassium 3.8; Sodium 139    Lipid Panel     Component Value Date/Time   TRIG 136 10/06/2019 1340     Wt Readings from Last 3 Encounters:  05/30/20 238 lb (108 kg)  02/26/20 234 lb (106.1 kg)  01/22/20 234 lb (106.1 kg)      Other studies Reviewed: Additional studies/ records that were reviewed today include: TTE 11/20/15, Cath 11/16/15  Review of  the above records today demonstrates:  - Left ventricle: The cavity size was moderately dilated. There was   mild concentric hypertrophy. Systolic function was severely   reduced. The estimated ejection fraction was in the range of 25%   to 30%. Diffuse hypokinesis. The study is not technically   sufficient to allow evaluation of LV diastolic function. - Aortic valve: Trileaflet; normal thickness leaflets. There was no   regurgitation. - Aortic root: The aortic root was normal in size. - Mitral valve: Structurally normal valve. There was no   regurgitation. - Left atrium: The atrium was moderately dilated. - Right ventricle: The cavity size was mildly dilated. Wall   thickness was normal. Systolic function was moderately reduced. - Right atrium: Pacer wire or catheter noted in right atrium. - Tricuspid valve: There was mild regurgitation. - Pulmonary arteries: Systolic pressure was within the normal   range. PA peak pressure: 33 mm Hg (S). - Inferior vena cava: The vessel was normal in size. -  Pericardium, extracardiac: There was no pericardial effusion.   The left ventricular systolic function is normal.  LV end diastolic pressure is normal.  The left ventricular ejection fraction is 55-65% by visual estimate.   1. Normal coronary anatomy 2. Normal LV function  TTE 03/23/16 - Left ventricle: The cavity size was normal. There was mild   concentric hypertrophy. Systolic function was normal. The   estimated ejection fraction was in the range of 55% to 60%. Wall   motion was normal; there were no regional wall motion   abnormalities. Doppler parameters are consistent with abnormal   left ventricular relaxation (grade 1 diastolic dysfunction). - Mitral valve: Mild, late systolicprolapse, involving the anterior   leaflet. - Left atrium: The atrium was mildly dilated.  ASSESSMENT AND PLAN:  1.  Cardiac arrest with ventricular fibrillation: Status post Medtronic ICD.  Currently on mexiletine.  No further ventricular arrhythmias.  No changes.    2.  Nonischemic cardiomyopathy: Ejection fraction has since improved.  No obvious volume overload.  3.  Paroxysmal atrial fibrillation: Currently on Coumadin.  CHA2DS2-VASc of 2.  Minimal episodes on device interrogation.  4.  Hypertension: Currently well controlled  Current medicines are reviewed at length with the patient today.   The patient does not have concerns regarding his medicines.  The following changes were made today: none  Labs/ tests ordered today include:  Orders Placed This Encounter  Procedures  . EKG 12-Lead     Disposition:   FU with Tehila Sokolow 12 months  Signed, Conswella Bruney [name], MD  05/30/2020 4:17 PM     [facility] [address] [place] [address] [phone] (office) [phone] (fax)

## 2020-06-21 ENCOUNTER — Other Ambulatory Visit: Payer: Self-pay | Admitting: Cardiology

## 2020-06-26 ENCOUNTER — Other Ambulatory Visit: Payer: Self-pay

## 2020-06-26 ENCOUNTER — Ambulatory Visit (INDEPENDENT_AMBULATORY_CARE_PROVIDER_SITE_OTHER): Payer: No Typology Code available for payment source | Admitting: *Deleted

## 2020-06-26 DIAGNOSIS — D7582 Heparin induced thrombocytopenia (HIT): Secondary | ICD-10-CM | POA: Diagnosis not present

## 2020-06-26 DIAGNOSIS — Z5181 Encounter for therapeutic drug level monitoring: Secondary | ICD-10-CM

## 2020-06-26 DIAGNOSIS — I4891 Unspecified atrial fibrillation: Secondary | ICD-10-CM | POA: Diagnosis not present

## 2020-06-26 DIAGNOSIS — D75829 Heparin-induced thrombocytopenia, unspecified: Secondary | ICD-10-CM

## 2020-06-26 LAB — POCT INR: INR: 2 (ref 2.0–3.0)

## 2020-06-26 NOTE — Patient Instructions (Signed)
Description   Take 10mg  today, then continue taking Warfarin 7.5mg  daily except 5mg  on Mondays and Fridays. Recheck INR in 6 weeks. Call coumadin clinic [phone]

## 2020-07-19 ENCOUNTER — Other Ambulatory Visit: Payer: Self-pay | Admitting: Cardiology

## 2020-08-06 ENCOUNTER — Ambulatory Visit (INDEPENDENT_AMBULATORY_CARE_PROVIDER_SITE_OTHER): Payer: No Typology Code available for payment source

## 2020-08-06 DIAGNOSIS — I428 Other cardiomyopathies: Secondary | ICD-10-CM

## 2020-08-07 ENCOUNTER — Other Ambulatory Visit: Payer: Self-pay

## 2020-08-07 ENCOUNTER — Ambulatory Visit (INDEPENDENT_AMBULATORY_CARE_PROVIDER_SITE_OTHER): Payer: Medicare Other

## 2020-08-07 DIAGNOSIS — I4891 Unspecified atrial fibrillation: Secondary | ICD-10-CM

## 2020-08-07 DIAGNOSIS — D7582 Heparin induced thrombocytopenia (HIT): Secondary | ICD-10-CM | POA: Diagnosis not present

## 2020-08-07 DIAGNOSIS — D75829 Heparin-induced thrombocytopenia, unspecified: Secondary | ICD-10-CM

## 2020-08-07 LAB — POCT INR: INR: 3.1 — AB (ref 2.0–3.0)

## 2020-08-07 NOTE — Patient Instructions (Signed)
Description   Take 5mg  today, then resume same dosage of Warfarin 7.5mg  daily except 5mg  on Mondays and Fridays. Recheck INR in 6 weeks. Call coumadin clinic [phone]

## 2020-08-13 LAB — CUP PACEART REMOTE DEVICE CHECK
Battery Remaining Longevity: 87 mo
Battery Voltage: 2.98 V
Brady Statistic RV Percent Paced: 0.05 %
Date Time Interrogation Session: 20220520012207
HighPow Impedance: 89 Ohm
Implantable Lead Implant Date: 20170911
Implantable Lead Location: 753860
Implantable Pulse Generator Implant Date: 20170911
Lead Channel Impedance Value: 380 Ohm
Lead Channel Impedance Value: 456 Ohm
Lead Channel Pacing Threshold Amplitude: 0.625 V
Lead Channel Pacing Threshold Pulse Width: 0.4 ms
Lead Channel Sensing Intrinsic Amplitude: 9.25 mV
Lead Channel Sensing Intrinsic Amplitude: 9.25 mV
Lead Channel Setting Pacing Amplitude: 2.5 V
Lead Channel Setting Pacing Pulse Width: 0.4 ms
Lead Channel Setting Sensing Sensitivity: 0.3 mV

## 2020-08-29 NOTE — Progress Notes (Signed)
Remote ICD transmission.   

## 2020-09-18 ENCOUNTER — Other Ambulatory Visit: Payer: Self-pay

## 2020-09-18 ENCOUNTER — Ambulatory Visit (INDEPENDENT_AMBULATORY_CARE_PROVIDER_SITE_OTHER): Payer: No Typology Code available for payment source | Admitting: *Deleted

## 2020-09-18 DIAGNOSIS — I4891 Unspecified atrial fibrillation: Secondary | ICD-10-CM | POA: Diagnosis not present

## 2020-09-18 DIAGNOSIS — D7582 Heparin induced thrombocytopenia (HIT): Secondary | ICD-10-CM | POA: Diagnosis not present

## 2020-09-18 DIAGNOSIS — D75829 Heparin-induced thrombocytopenia, unspecified: Secondary | ICD-10-CM

## 2020-09-18 DIAGNOSIS — Z5181 Encounter for therapeutic drug level monitoring: Secondary | ICD-10-CM

## 2020-09-18 LAB — POCT INR: INR: 2.1 (ref 2.0–3.0)

## 2020-09-18 NOTE — Patient Instructions (Signed)
Description   Continue taking Warfarin 7.5mg  daily except 5mg  on Mondays and Fridays. Recheck INR in 6 weeks. Call coumadin clinic [phone]

## 2020-09-30 ENCOUNTER — Other Ambulatory Visit: Payer: Self-pay | Admitting: Cardiovascular Disease

## 2020-10-03 IMAGING — DX DG CHEST 1V PORT
1 series · 1 of 1 positions shown · non-contrast
Comparison: 12/02/2015

CLINICAL DATA: Shortness of breath.  Cough.

EXAM:
PORTABLE CHEST 1 VIEW

[chest ap]
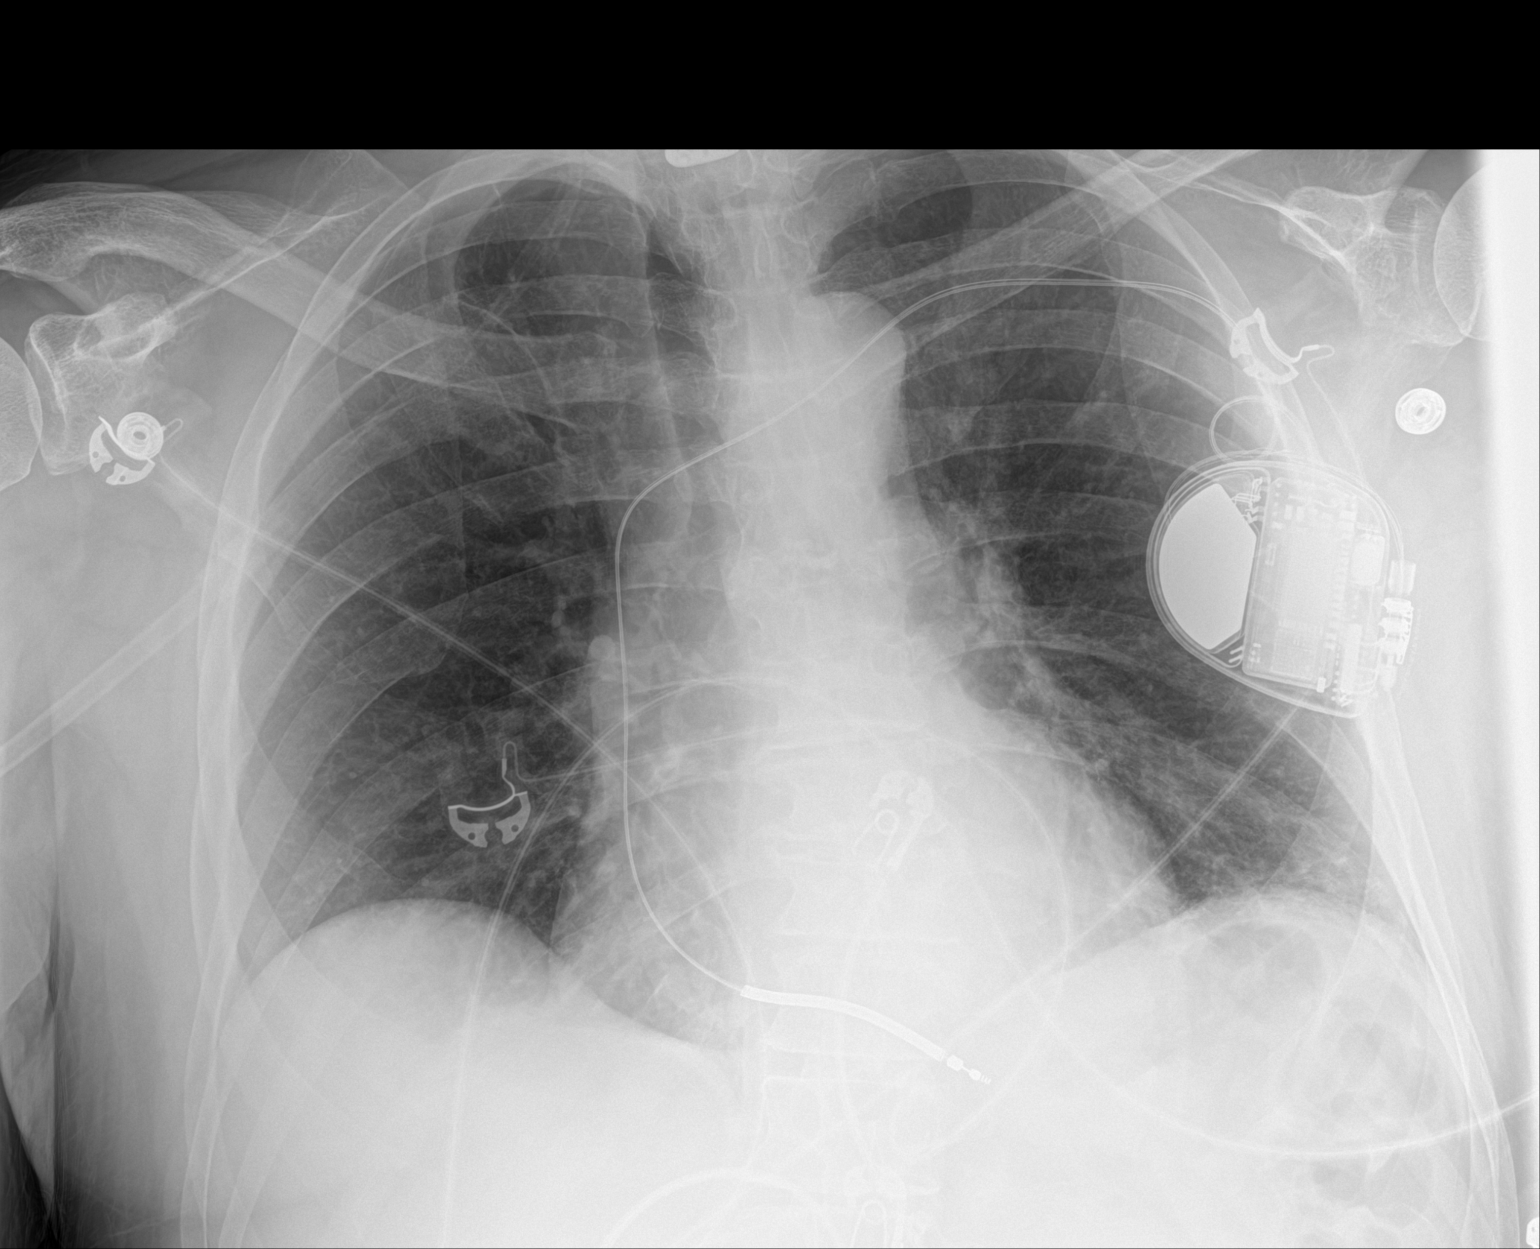

[1 of 1 positions shown; findings below may reference images not displayed]

FINDINGS: Stable position of the left cardiac ICD. Heart size is within normal
limits. Surgical plate in the lower cervical spine. Small new
densities at the left lung base. Otherwise, the lungs are clear.
Negative for a pneumothorax.
IMPRESSION: Small densities at the left lung base are nonspecific. Findings
could represent atelectasis versus small focus of infection.

## 2020-10-14 ENCOUNTER — Other Ambulatory Visit: Payer: Self-pay | Admitting: Cardiology

## 2020-10-30 ENCOUNTER — Other Ambulatory Visit: Payer: Self-pay

## 2020-10-30 ENCOUNTER — Ambulatory Visit (INDEPENDENT_AMBULATORY_CARE_PROVIDER_SITE_OTHER): Payer: No Typology Code available for payment source | Admitting: *Deleted

## 2020-10-30 DIAGNOSIS — I4891 Unspecified atrial fibrillation: Secondary | ICD-10-CM

## 2020-10-30 DIAGNOSIS — D7582 Heparin induced thrombocytopenia (HIT): Secondary | ICD-10-CM | POA: Diagnosis not present

## 2020-10-30 DIAGNOSIS — Z5181 Encounter for therapeutic drug level monitoring: Secondary | ICD-10-CM | POA: Diagnosis not present

## 2020-10-30 DIAGNOSIS — D75829 Heparin-induced thrombocytopenia, unspecified: Secondary | ICD-10-CM

## 2020-10-30 LAB — POCT INR: INR: 2.1 (ref 2.0–3.0)

## 2020-10-30 NOTE — Patient Instructions (Signed)
Description   Continue taking Warfarin 7.5mg daily except 5mg on Mondays and Fridays. Recheck INR in 6 weeks. Call coumadin clinic [phone]     

## 2020-11-05 ENCOUNTER — Ambulatory Visit (INDEPENDENT_AMBULATORY_CARE_PROVIDER_SITE_OTHER): Payer: Medicare Other

## 2020-11-05 DIAGNOSIS — I428 Other cardiomyopathies: Secondary | ICD-10-CM | POA: Diagnosis not present

## 2020-11-05 LAB — CUP PACEART REMOTE DEVICE CHECK
Battery Remaining Longevity: 86 mo
Battery Voltage: 3 V
Brady Statistic RV Percent Paced: 0.07 %
Date Time Interrogation Session: 20220817022603
HighPow Impedance: 81 Ohm
Implantable Lead Implant Date: 20170911
Implantable Lead Location: 753860
Implantable Pulse Generator Implant Date: 20170911
Lead Channel Impedance Value: 342 Ohm
Lead Channel Impedance Value: 437 Ohm
Lead Channel Pacing Threshold Amplitude: 0.625 V
Lead Channel Pacing Threshold Pulse Width: 0.4 ms
Lead Channel Sensing Intrinsic Amplitude: 9.125 mV
Lead Channel Sensing Intrinsic Amplitude: 9.125 mV
Lead Channel Setting Pacing Amplitude: 2.5 V
Lead Channel Setting Pacing Pulse Width: 0.4 ms
Lead Channel Setting Sensing Sensitivity: 0.3 mV

## 2020-11-07 ENCOUNTER — Other Ambulatory Visit: Payer: Self-pay | Admitting: Cardiovascular Disease

## 2020-11-25 NOTE — Progress Notes (Signed)
Remote ICD transmission.   

## 2020-12-08 NOTE — Progress Notes (Signed)
Cardiology Office Note:    Date:  12/09/2020   ID:  ABHI MOCCIA, DOB 1956-01-27, MRN [id number]  PCP:  Shon Hale, MD  [facility] Providers Cardiologist:  Tonny Bollman, MD Electrophysiologist:  Regan Lemming, MD     Referring MD: Shon Hale, *   Chief Complaint:  F/u on CHF, AFib    Patient Profile:   Arthur Chase is a 65 y.o. male with:  HFimpEF (heart failure with improved ejection fraction)  Non-ischemic cardiomyopathy  Echocardiogram 8/17: EF < 20 CMR 9/17: EF 68 Echocardiogram 7/21: EF 55-60 Hx of OOH cardiac arrest S/p ICD Ventricular tachycardia Mexiletine Rx  Paroxysmal atrial fibrillation  Warfarin Rx Hx of HIT Hypertension  Hx of COVID-19 pneumonia C/b AF w RVR  Prior CV studies: Echocardiogram 10/08/19 EF 55-60, no RWMA, normal RVSF  Cardiac MRI 11/26/15 IMPRESSION: 1) Moderate LVH EF 68% no RWMA;s 2) No delayed gadolinium uptake No scar/infiltration of LV myocardium 3) Normal RV size and function  LEFT HEART CATH AND CORONARY ANGIOGRAPHY 11/16/2015 Narrative  The left ventricular systolic function is normal.  LV end diastolic pressure is normal.  The left ventricular ejection fraction is 55-65% by visual estimate. 1. Normal coronary anatomy 2. Normal LV function  Echocardiogram 11/16/15 EF <20, global HK, moderate LVH, mild TR, RVSP 31, trivial posterior pericardial effusion  History of Present Illness: Mr. Lavell was last seen by Dr. Excell Seltzer in 8/21.  He returns for f/u.  He is here alone.  He has been doing well.  He gets short of breath with more extreme activities.  This is overall stable.  He has not had chest pain, syncope, orthopnea or significant leg edema.  He has significant varicose veins in both legs and has had vein stripping/ablation in the past.        Past Medical History:  Diagnosis Date   AICD (automatic cardioverter/defibrillator) present    Anoxic encephalopathy (HCC) 10/2015   a.  10/2015 in setting of cardiac arrest - recovered prior to DC.   Cardiac arrest with ventricular fibrillation (HCC)    a. 11/2015: Unclear etiology. Nl cors on cath 8/27. S/P cooling. On amiodarone and s/p ICD for secondary prevention on 12/01/15. EF down to 25-30% but normalized to 68 by subsequent cardiac MR   COVID-19 10/06/2019   HIT (heparin-induced thrombocytopenia) (HCC)    a. 11/2015 during admission for VF arrest. HIT panel positive. Placed on coumadin for 2 months   HTN (hypertension)    Hypokalemia    a. 11/2015: resolved on K supplementation and spironolactone   Hypothyroidism    ICD (implantable cardioverter-defibrillator) in place    a. Medtronic Visia AF MRI (serial  Number [id number]) ICD.   Myocardial stunning    a. 11/2015: EF down to 20-25% after VF arrest but improved to ~68 by subsequent cardiac MR.   NICM (nonischemic cardiomyopathy) (HCC)    Obesity    PAF (paroxysmal atrial fibrillation) (HCC)    a. 11/2015 brief run of afib with RVR while intubated during a complicated admission for VF arrest and VDRF. No recurrence on amio. CHADSVASC 2 (CHF, HTN) and on coumadin for HIT with positive thrombotic markers. If no long term recurrence, he may be able to come off [facility] -    Ventricular tachycardia (HCC) 10/2015   Current Medications: Current Meds  Medication Sig   albuterol (VENTOLIN HFA) 108 (90 Base) MCG/ACT inhaler Inhale 2 puffs into the lungs every 6 (six) hours as needed for wheezing or  shortness of breath.   atorvastatin (LIPITOR) 10 MG tablet Take 10 mg by mouth daily.   fluticasone (FLONASE) 50 MCG/ACT nasal spray Place 1 spray into both nostrils daily.   HYDROcodone-acetaminophen (NORCO) 7.5-325 MG tablet Take 1 tablet by mouth every 6 (six) hours as needed for moderate pain.   levothyroxine (SYNTHROID) 75 MCG tablet Take 75 mcg by mouth daily before breakfast.    losartan (COZAAR) 25 MG tablet TAKE 1/2 TABLET BY MOUTH EVERY DAY   metoprolol succinate (TOPROL-XL) 100 MG  24 hr tablet TAKE 1 TABLET BY MOUTH EVERY DAY   mexiletine (MEXITIL) 200 MG capsule TAKE 1 CAPSULE BY MOUTH TWICE A DAY   Multiple Vitamin (MULTIVITAMIN WITH MINERALS) TABS tablet Take 1 tablet by mouth daily. Centrum Silver   pantoprazole (PROTONIX) 40 MG tablet Take 1 tablet (40 mg total) by mouth daily.   potassium chloride SA (KLOR-CON M20) 20 MEQ tablet TAKE 1 TABLET BY MOUTH TWICE A DAY   spironolactone (ALDACTONE) 25 MG tablet Take 1 tablet (25 mg total) by mouth daily. Please keep 12/09/2020 appointment for further refills   warfarin (COUMADIN) 5 MG tablet TAKE 1 TO 1 AND 1/2 TABLETS BY MOUTH EVERY DAY AS DIRECTED BY COUMADIN CLINIC    Allergies:   Heparin   Social History   Tobacco Use   Smoking status: Former    Types: Cigarettes    Quit date: 1995    Years since quitting: 27.7   Smokeless tobacco: Never  Vaping Use   Vaping Use: Never used  Substance Use Topics   Alcohol use: No    Comment: quit at age 73   Drug use: No    Family Hx: The patient's family history includes Alzheimer's disease in his father; Heart attack in his mother.  Review of Systems  Gastrointestinal:  Negative for hematochezia and melena.  Genitourinary:  Negative for hematuria.    EKGs/Labs/Other Test Reviewed:    EKG:  EKG is not ordered today.  The ekg ordered today demonstrates n/a  Recent Labs: 01/14/2020: BUN 14; Creatinine, Ser 0.70; Hemoglobin 14.7; Platelets 193; Potassium 3.8; Sodium 139   Recent Lipid Panel Lab Results  Component Value Date/Time   TRIG 136 10/06/2019 01:40 PM   Labs obtained through [facility] Tool - personally reviewed and interpreted: 02/28/2020: Total cholesterol 182, HDL 33, LDL 112, triglycerides 207 09/11/2020: Creatinine 0.89, K+ on 4.1, ALT 18, TSH 2.14   Risk Assessment/Calculations:    CHA2DS2-VASc Score = 3   This indicates a 3.2% annual risk of stroke. The patient's score is based upon: CHF History: 1 HTN History: 1 Diabetes History: 0 Stroke  History: 0 Vascular Disease History: 0 Age Score: 1 Gender Score: 0         Physical Exam:    VS:  BP 104/74   Pulse 66   Ht 5\' 10"  (1.778 m)   Wt 234 lb (106.1 kg)   SpO2 97%   BMI 33.58 kg/m     Wt Readings from Last 3 Encounters:  12/09/20 234 lb (106.1 kg)  05/30/20 238 lb (108 kg)  02/26/20 234 lb (106.1 kg)    Constitutional:      Appearance: Healthy appearance. Not in distress.  Neck:     Vascular: No carotid bruit. JVD normal.  Pulmonary:     Effort: Pulmonary effort is normal.     Breath sounds: No wheezing. No rales.  Cardiovascular:     Normal rate. Regular rhythm. Normal S1. Normal S2.  Murmurs: There is no murmur.  Edema:    Peripheral edema present.    Pretibial: bilateral trace edema of the pretibial area.    Comments: Multiple varicosities with chronic stasis changes Abdominal:     Palpations: Abdomen is soft. There is no hepatomegaly.  Skin:    General: Skin is warm and dry.  Neurological:     General: No focal deficit present.     Mental Status: Alert and oriented to person, place and time.     Cranial Nerves: Cranial nerves are intact.       ASSESSMENT & PLAN:   1. NICM (nonischemic cardiomyopathy) (HCC) EF was as low as 20% in the past.  This has recovered to normal.  Echocardiogram in 2021 demonstrated EF 55-60.  He is NYHA II.  Volume status is stable.  Continue losartan 12.5 mg daily, metoprolol succinate 100 mg daily, spironolactone 25 mg daily.  Follow-up in 1 year.  2. Paroxysmal atrial fibrillation (HCC) CHA2DS2-VASc Score = 3 [CHF History: 1, HTN History: 1, Diabetes History: 0, Stroke History: 0, Vascular Disease History: 0, Age Score: 1, Gender Score: 0].  Therefore, the patient's annual risk of stroke is 3.2 %.  He is tolerating warfarin therapy which is managed in our Coumadin clinic.  Most recent INR 2.1.  3. VT (ventricular tachycardia) (HCC) 4. ICD (implantable cardioverter-defibrillator) in place He remains on mexiletine.   Follow-up with the EP as planned.  5. Essential hypertension Blood pressure is well controlled.  Continue losartan 12.5 mg daily, metoprolol succinate 100 mg daily, spironolactone 25 mg daily.  6. Mixed hyperlipidemia He remains on atorvastatin.  This is managed by primary care.      Dispo:  Return in about 1 year (around 12/09/2021) for Routine Follow Up, w/ Dr. Excell Seltzer, or Tereso Newcomer, PA-C.   Medication Adjustments/Labs and Tests Ordered: Current medicines are reviewed at length with the patient today.  Concerns regarding medicines are outlined above.  Tests Ordered: No orders of the defined types were placed in this encounter.  Medication Changes: No orders of the defined types were placed in this encounter.  Signed, Tereso Newcomer, PA-C  12/09/2020 9:46 AM    [facility] [address] [place], [place], [place]  [address] Phone: [phone]; Fax: [phone]

## 2020-12-09 ENCOUNTER — Ambulatory Visit (INDEPENDENT_AMBULATORY_CARE_PROVIDER_SITE_OTHER): Payer: Medicare Other | Admitting: Physician Assistant

## 2020-12-09 ENCOUNTER — Encounter: Payer: Self-pay | Admitting: Physician Assistant

## 2020-12-09 ENCOUNTER — Other Ambulatory Visit: Payer: Self-pay

## 2020-12-09 ENCOUNTER — Ambulatory Visit (INDEPENDENT_AMBULATORY_CARE_PROVIDER_SITE_OTHER): Payer: Medicare Other

## 2020-12-09 VITALS — BP 104/74 | HR 66 | Ht 70.0 in | Wt 234.0 lb

## 2020-12-09 DIAGNOSIS — I4891 Unspecified atrial fibrillation: Secondary | ICD-10-CM

## 2020-12-09 DIAGNOSIS — D7582 Heparin induced thrombocytopenia (HIT): Secondary | ICD-10-CM | POA: Diagnosis not present

## 2020-12-09 DIAGNOSIS — I428 Other cardiomyopathies: Secondary | ICD-10-CM

## 2020-12-09 DIAGNOSIS — Z95810 Presence of automatic (implantable) cardiac defibrillator: Secondary | ICD-10-CM

## 2020-12-09 DIAGNOSIS — I10 Essential (primary) hypertension: Secondary | ICD-10-CM

## 2020-12-09 DIAGNOSIS — I472 Ventricular tachycardia, unspecified: Secondary | ICD-10-CM

## 2020-12-09 DIAGNOSIS — I480 Paroxysmal atrial fibrillation: Secondary | ICD-10-CM

## 2020-12-09 DIAGNOSIS — D75829 Heparin-induced thrombocytopenia, unspecified: Secondary | ICD-10-CM

## 2020-12-09 DIAGNOSIS — E782 Mixed hyperlipidemia: Secondary | ICD-10-CM

## 2020-12-09 LAB — POCT INR: INR: 2.2 (ref 2.0–3.0)

## 2020-12-09 NOTE — Patient Instructions (Signed)
Description   Continue taking Warfarin 7.5mg daily except 5mg on Mondays and Fridays. Recheck INR in 6 weeks. Call coumadin clinic [phone]     

## 2020-12-09 NOTE — Patient Instructions (Signed)

## 2020-12-17 ENCOUNTER — Other Ambulatory Visit: Payer: Self-pay | Admitting: Cardiovascular Disease

## 2020-12-17 MED ORDER — POTASSIUM CHLORIDE CRYS ER 20 MEQ PO TBCR
EXTENDED_RELEASE_TABLET | ORAL | 3 refills | Status: DC
Start: 2020-12-17 — End: 2021-11-02

## 2020-12-23 ENCOUNTER — Other Ambulatory Visit: Payer: Self-pay | Admitting: Cardiovascular Disease

## 2021-01-07 ENCOUNTER — Other Ambulatory Visit: Payer: Self-pay | Admitting: Cardiovascular Disease

## 2021-01-20 ENCOUNTER — Other Ambulatory Visit: Payer: Self-pay

## 2021-01-20 ENCOUNTER — Ambulatory Visit (INDEPENDENT_AMBULATORY_CARE_PROVIDER_SITE_OTHER): Payer: Medicare Other | Admitting: *Deleted

## 2021-01-20 DIAGNOSIS — D75829 Heparin-induced thrombocytopenia, unspecified: Secondary | ICD-10-CM

## 2021-01-20 DIAGNOSIS — I4891 Unspecified atrial fibrillation: Secondary | ICD-10-CM

## 2021-01-20 DIAGNOSIS — Z5181 Encounter for therapeutic drug level monitoring: Secondary | ICD-10-CM

## 2021-01-20 LAB — POCT INR: INR: 2.1 (ref 2.0–3.0)

## 2021-01-20 NOTE — Patient Instructions (Signed)
Description   Continue taking Warfarin 7.5mg  daily except 5mg  on Mondays and Fridays. Recheck INR in 6 weeks. Call coumadin clinic [phone]

## 2021-02-04 ENCOUNTER — Ambulatory Visit (INDEPENDENT_AMBULATORY_CARE_PROVIDER_SITE_OTHER): Payer: No Typology Code available for payment source

## 2021-02-04 DIAGNOSIS — I428 Other cardiomyopathies: Secondary | ICD-10-CM

## 2021-02-04 LAB — CUP PACEART REMOTE DEVICE CHECK
Battery Remaining Longevity: 85 mo
Battery Voltage: 2.99 V
Brady Statistic RV Percent Paced: 0.07 %
Date Time Interrogation Session: 20221116043824
HighPow Impedance: 76 Ohm
Implantable Lead Implant Date: 20170911
Implantable Lead Location: 753860
Implantable Pulse Generator Implant Date: 20170911
Lead Channel Impedance Value: 323 Ohm
Lead Channel Impedance Value: 437 Ohm
Lead Channel Pacing Threshold Amplitude: 0.5 V
Lead Channel Pacing Threshold Pulse Width: 0.4 ms
Lead Channel Sensing Intrinsic Amplitude: 10 mV
Lead Channel Sensing Intrinsic Amplitude: 10 mV
Lead Channel Setting Pacing Amplitude: 2.5 V
Lead Channel Setting Pacing Pulse Width: 0.4 ms
Lead Channel Setting Sensing Sensitivity: 0.3 mV

## 2021-02-11 NOTE — Progress Notes (Signed)
Remote ICD transmission.   

## 2021-03-03 ENCOUNTER — Ambulatory Visit (INDEPENDENT_AMBULATORY_CARE_PROVIDER_SITE_OTHER): Payer: Medicare Other | Admitting: *Deleted

## 2021-03-03 ENCOUNTER — Other Ambulatory Visit: Payer: Self-pay

## 2021-03-03 DIAGNOSIS — D75829 Heparin-induced thrombocytopenia, unspecified: Secondary | ICD-10-CM | POA: Diagnosis not present

## 2021-03-03 DIAGNOSIS — Z5181 Encounter for therapeutic drug level monitoring: Secondary | ICD-10-CM | POA: Diagnosis not present

## 2021-03-03 DIAGNOSIS — I4891 Unspecified atrial fibrillation: Secondary | ICD-10-CM | POA: Diagnosis not present

## 2021-03-03 LAB — POCT INR: INR: 2.4 (ref 2.0–3.0)

## 2021-03-03 NOTE — Patient Instructions (Signed)
Description   Continue taking Warfarin 7.5mg daily except 5mg on Mondays and Fridays. Recheck INR in 6 weeks. Call coumadin clinic [phone]     

## 2021-04-12 ENCOUNTER — Other Ambulatory Visit: Payer: Self-pay | Admitting: Cardiology

## 2021-04-13 NOTE — Telephone Encounter (Signed)
Prescription refill request received for warfarin Lov: 12/09/2020 [place] Next INR check: 1/24 Warfarin tablet strength: 5mg 

## 2021-04-14 ENCOUNTER — Ambulatory Visit (INDEPENDENT_AMBULATORY_CARE_PROVIDER_SITE_OTHER): Payer: Medicare Other

## 2021-04-14 ENCOUNTER — Other Ambulatory Visit: Payer: Self-pay

## 2021-04-14 DIAGNOSIS — Z5181 Encounter for therapeutic drug level monitoring: Secondary | ICD-10-CM

## 2021-04-14 DIAGNOSIS — I4891 Unspecified atrial fibrillation: Secondary | ICD-10-CM

## 2021-04-14 DIAGNOSIS — D75829 Heparin-induced thrombocytopenia, unspecified: Secondary | ICD-10-CM | POA: Diagnosis not present

## 2021-04-14 LAB — POCT INR: INR: 2.1 (ref 2.0–3.0)

## 2021-04-14 NOTE — Patient Instructions (Signed)
Continue taking Warfarin 7.5mg  daily except 5mg  on Mondays and Fridays. Recheck INR in 6 weeks. Call coumadin clinic [phone]

## 2021-05-06 ENCOUNTER — Ambulatory Visit (INDEPENDENT_AMBULATORY_CARE_PROVIDER_SITE_OTHER): Payer: No Typology Code available for payment source

## 2021-05-06 DIAGNOSIS — I428 Other cardiomyopathies: Secondary | ICD-10-CM

## 2021-05-06 LAB — CUP PACEART REMOTE DEVICE CHECK
Battery Remaining Longevity: 81 mo
Battery Voltage: 2.99 V
Brady Statistic RV Percent Paced: 0.13 %
Date Time Interrogation Session: 20230215043624
HighPow Impedance: 77 Ohm
Implantable Lead Implant Date: 20170911
Implantable Lead Location: 753860
Implantable Pulse Generator Implant Date: 20170911
Lead Channel Impedance Value: 342 Ohm
Lead Channel Impedance Value: 437 Ohm
Lead Channel Pacing Threshold Amplitude: 0.625 V
Lead Channel Pacing Threshold Pulse Width: 0.4 ms
Lead Channel Sensing Intrinsic Amplitude: 9.625 mV
Lead Channel Sensing Intrinsic Amplitude: 9.625 mV
Lead Channel Setting Pacing Amplitude: 2.5 V
Lead Channel Setting Pacing Pulse Width: 0.4 ms
Lead Channel Setting Sensing Sensitivity: 0.3 mV

## 2021-05-12 NOTE — Progress Notes (Signed)
Remote ICD transmission.   

## 2021-05-13 ENCOUNTER — Telehealth: Payer: Self-pay | Admitting: *Deleted

## 2021-05-13 NOTE — Telephone Encounter (Signed)
° °  Pre-operative Risk Assessment    Patient Name: Arthur Chase  DOB: May 06, 1955 MRN: [id number]      Request for Surgical Clearance    Procedure:   COLONOSCOPY : RECTAL BLEEDING; H/O COLON POLYPS  Date of Surgery:  Clearance 06/22/21                                 Surgeon:  DR. Levora Angel Surgeon's Group or Practice Name:  [facility] Phone number:  [phone] Fax number:  [phone]   Type of Clearance Requested:   - Medical  - Pharmacy:  Hold Warfarin (Coumadin) x 5 DAYS PRIOR TO PROCEDURE   Type of Anesthesia:   PROPOFOL   Additional requests/questions:    Elpidio Anis   05/13/2021, 1:32 PM

## 2021-05-13 NOTE — Telephone Encounter (Signed)
Patient with diagnosis of afib on warfarin for anticoagulation.    Procedure: COLONOSCOPY : RECTAL BLEEDING; H/O COLON POLYPS Date of procedure: 06/22/21   CHA2DS2-VASc Score = 3   This indicates a 3.2% annual risk of stroke. The patient's score is based upon: CHF History: 1 HTN History: 1 Diabetes History: 0 Stroke History: 0 Vascular Disease History: 0 Age Score: 1 Gender Score: 0      Per office protocol, patient can hold warfarin for 5 days prior to procedure.    Patient will NOT need bridging with Lovenox (enoxaparin) around procedure.

## 2021-05-15 NOTE — Telephone Encounter (Signed)
° °  Primary Cardiologist: Tonny Bollman, MD  Chart reviewed as part of pre-operative protocol coverage. Given past medical history and time since last visit, based on ACC/AHA guidelines, [name] would be at acceptable risk for the planned procedure without further cardiovascular testing.   Patient with diagnosis of afib on warfarin for anticoagulation.     Procedure: COLONOSCOPY : RECTAL BLEEDING; H/O COLON POLYPS Date of procedure: 06/22/21     CHA2DS2-VASc Score = 3   This indicates a 3.2% annual risk of stroke. The patient's score is based upon: CHF History: 1 HTN History: 1 Diabetes History: 0 Stroke History: 0 Vascular Disease History: 0 Age Score: 1 Gender Score: 0       Per office protocol, patient can hold warfarin for 5 days prior to procedure.     Patient will NOT need bridging with Lovenox (enoxaparin) around procedure.  Patient was advised that if he develops new symptoms prior to surgery to contact our office to arrange a follow-up appointment.  He verbalized understanding.  I will route this recommendation to the requesting party via Epic fax function and remove from pre-op pool.  Please call with questions.  [name]. [name] NP-C    05/15/2021, 9:41 AM [facility] [address] Office [phone] Fax [phone]

## 2021-05-26 ENCOUNTER — Ambulatory Visit (INDEPENDENT_AMBULATORY_CARE_PROVIDER_SITE_OTHER): Payer: Medicare Other | Admitting: *Deleted

## 2021-05-26 ENCOUNTER — Other Ambulatory Visit: Payer: Self-pay

## 2021-05-26 DIAGNOSIS — I4891 Unspecified atrial fibrillation: Secondary | ICD-10-CM | POA: Diagnosis not present

## 2021-05-26 DIAGNOSIS — D75829 Heparin-induced thrombocytopenia, unspecified: Secondary | ICD-10-CM

## 2021-05-26 LAB — POCT INR: INR: 2.5 (ref 2.0–3.0)

## 2021-05-26 NOTE — Patient Instructions (Addendum)
Description   ?Continue taking Warfarin 7.5mg  daily except 5mg  on Mondays and Fridays. Recheck INR in 5 weeks-which is 1 week after procedure. Call coumadin clinic [phone] ?  ?  ?When you resume your warfarin take an extra 1/2 tablet for two days then resume normal warfarin dose.  ? ?

## 2021-06-03 ENCOUNTER — Other Ambulatory Visit: Payer: Self-pay | Admitting: Cardiology

## 2021-06-29 ENCOUNTER — Ambulatory Visit (INDEPENDENT_AMBULATORY_CARE_PROVIDER_SITE_OTHER): Payer: Medicare Other

## 2021-06-29 DIAGNOSIS — Z5181 Encounter for therapeutic drug level monitoring: Secondary | ICD-10-CM

## 2021-06-29 DIAGNOSIS — D75829 Heparin-induced thrombocytopenia, unspecified: Secondary | ICD-10-CM | POA: Diagnosis not present

## 2021-06-29 DIAGNOSIS — I4891 Unspecified atrial fibrillation: Secondary | ICD-10-CM

## 2021-06-29 LAB — POCT INR: INR: 1.7 — AB (ref 2.0–3.0)

## 2021-06-29 NOTE — Patient Instructions (Signed)
-   take 2 tablets tonight, then ?- continue taking Warfarin 7.5mg  daily except 5mg  on Mondays and Fridays.  ?- Recheck INR in 6 weeks  ?Call coumadin clinic [phone] ?

## 2021-07-09 ENCOUNTER — Other Ambulatory Visit: Payer: Self-pay | Admitting: Cardiology

## 2021-07-09 DIAGNOSIS — I4891 Unspecified atrial fibrillation: Secondary | ICD-10-CM

## 2021-07-09 NOTE — Telephone Encounter (Signed)
Prescription refill request received for warfarin ?Lov: 12/09/20 Arthur Chase) ?Next INR check: 08/10/21 ?Warfarin tablet strength: 5mg  ? ?Appropriate dose and refill sent to requested pharmacy.  ?

## 2021-08-05 ENCOUNTER — Ambulatory Visit (INDEPENDENT_AMBULATORY_CARE_PROVIDER_SITE_OTHER): Payer: No Typology Code available for payment source

## 2021-08-05 DIAGNOSIS — I472 Ventricular tachycardia, unspecified: Secondary | ICD-10-CM

## 2021-08-06 LAB — CUP PACEART REMOTE DEVICE CHECK
Battery Remaining Longevity: 74 mo
Battery Voltage: 2.99 V
Brady Statistic RV Percent Paced: 0.33 %
Date Time Interrogation Session: 20230517012404
HighPow Impedance: 85 Ohm
Implantable Lead Implant Date: 20170911
Implantable Lead Location: 753860
Implantable Pulse Generator Implant Date: 20170911
Lead Channel Impedance Value: 342 Ohm
Lead Channel Impedance Value: 437 Ohm
Lead Channel Pacing Threshold Amplitude: 0.5 V
Lead Channel Pacing Threshold Pulse Width: 0.4 ms
Lead Channel Sensing Intrinsic Amplitude: 7.875 mV
Lead Channel Sensing Intrinsic Amplitude: 7.875 mV
Lead Channel Setting Pacing Amplitude: 2.5 V
Lead Channel Setting Pacing Pulse Width: 0.4 ms
Lead Channel Setting Sensing Sensitivity: 0.3 mV

## 2021-08-10 ENCOUNTER — Ambulatory Visit (INDEPENDENT_AMBULATORY_CARE_PROVIDER_SITE_OTHER): Payer: Medicare Other | Admitting: Pharmacist

## 2021-08-10 DIAGNOSIS — D75829 Heparin-induced thrombocytopenia, unspecified: Secondary | ICD-10-CM

## 2021-08-10 DIAGNOSIS — I4891 Unspecified atrial fibrillation: Secondary | ICD-10-CM | POA: Diagnosis not present

## 2021-08-10 LAB — POCT INR: INR: 2.3 (ref 2.0–3.0)

## 2021-08-10 NOTE — Patient Instructions (Signed)
Description   Continue taking Warfarin 1.5 tablets daily except 1 tablet on Mondays and Fridays.  - Recheck INR in 6 weeks at [facility] office ([address]) Call coumadin clinic [phone]

## 2021-08-18 ENCOUNTER — Ambulatory Visit (INDEPENDENT_AMBULATORY_CARE_PROVIDER_SITE_OTHER): Payer: Medicare Other | Admitting: Cardiology

## 2021-08-18 ENCOUNTER — Encounter: Payer: Self-pay | Admitting: Cardiology

## 2021-08-18 VITALS — BP 108/76 | HR 56 | Ht 71.75 in | Wt 236.4 lb

## 2021-08-18 DIAGNOSIS — D6869 Other thrombophilia: Secondary | ICD-10-CM

## 2021-08-18 DIAGNOSIS — I480 Paroxysmal atrial fibrillation: Secondary | ICD-10-CM

## 2021-08-18 DIAGNOSIS — I472 Ventricular tachycardia, unspecified: Secondary | ICD-10-CM | POA: Diagnosis not present

## 2021-08-18 NOTE — Progress Notes (Signed)
Remote ICD transmission.   

## 2021-08-18 NOTE — Patient Instructions (Signed)
Medication Instructions:  Your physician recommends that you continue on your current medications as directed. Please refer to the Current Medication list given to you today.  *If you need a refill on your cardiac medications before your next appointment, please call your pharmacy*   Lab Work: None ordered   Testing/Procedures: None ordered   Follow-Up: At [facility], you and your health needs are our priority.  As part of our continuing mission to provide you with exceptional heart care, we have created designated Provider Care Teams.  These Care Teams include your primary Cardiologist (physician) and Advanced Practice Providers (APPs -  Physician Assistants and Nurse Practitioners) who all work together to provide you with the care you need, when you need it.   Remote monitoring is used to monitor your Pacemaker or ICD from home. This monitoring reduces the number of office visits required to check your device to one time per year. It allows [place] to keep an eye on the functioning of your device to ensure it is working properly. You are scheduled for a device check from home on 11/04/21. You may send your transmission at any time that day. If you have a wireless device, the transmission will be sent automatically. After your physician reviews your transmission, you will receive a postcard with your next transmission date.  Your next appointment:   1 year(s)  The format for your next appointment:   In Person  Provider:   You will see one of the following Advanced Practice Providers on your designated Care Team:   Francis Dowse, [place] Casimiro Needle "Mardelle Matte" Lanna Poche, [place]  Thank you for choosing [facility]!!   Dory Horn, RN [phone]    Other Instructions   Important Information About Sugar

## 2021-08-18 NOTE — Progress Notes (Signed)
Electrophysiology Office Note   Date:  08/18/2021   ID:  Arthur Chase, DOB [birth year], MRN [id number]  PCP:  Glenis Smoker, MD  Cardiologist:  Burt Knack Primary Electrophysiologist:  Alizandra Loh Meredith Leeds, MD    No chief complaint on file.    History of Present Illness: Arthur Chase is a 66 y.o. male who presents today for electrophysiology evaluation.     He has a history significant obesity, hypertension, cardiac arrest in 0000000 complicated by anoxic encephalopathy, cardiogenic shock, acute systolic heart failure, VT/VF, heparin-induced thrombocytopenia, hypokalemia, atrial fibrillation.  He is status post Medtronic ICD implanted 12/01/2015.    He was admitted to the hospital 11/16/2015 with cardiac arrest.  EMS strips demonstrated VT/VF with a cycle length of 280 ms.  He was successfully resuscitated as above and underwent prolonged hospital admission with cooling.  Today, denies symptoms of palpitations, chest pain, shortness of breath, orthopnea, PND, lower extremity edema, claudication, dizziness, presyncope, syncope, bleeding, or neurologic sequela. The patient is tolerating medications without difficulties.  Since being seen he has done well.  He has had no chest pain or shortness of breath.  He is able to do all of his daily activities.  He has no concerns at this time.   Past Medical History:  Diagnosis Date   AICD (automatic cardioverter/defibrillator) present    Anoxic encephalopathy ([place]) 10/2015   a. 10/2015 in setting of cardiac arrest - recovered prior to DC.   Cardiac arrest with ventricular fibrillation ([place])    a. 11/2015: Unclear etiology. Nl cors on cath 8/27. S/P cooling. On amiodarone and s/p ICD for secondary prevention on 12/01/15. EF down to 25-30% but normalized to 68 by subsequent cardiac MR   COVID-19 10/06/2019   HIT (heparin-induced thrombocytopenia) ([place])    a. 11/2015 during admission for VF arrest. HIT panel positive. Placed on coumadin for 2  months   HTN (hypertension)    Hypokalemia    a. 11/2015: resolved on K supplementation and spironolactone   Hypothyroidism    ICD (implantable cardioverter-defibrillator) in place    a. Medtronic Visia AF MRI (serial  Number [id number]) ICD.   Myocardial stunning    a. 11/2015: EF down to 20-25% after VF arrest but improved to ~68 by subsequent cardiac MR.   NICM (nonischemic cardiomyopathy) ([place])    Obesity    PAF (paroxysmal atrial fibrillation) ([place])    a. 11/2015 brief run of afib with RVR while intubated during a complicated admission for VF arrest and VDRF. No recurrence on amio. CHADSVASC 2 (CHF, HTN) and on coumadin for HIT with positive thrombotic markers. If no long term recurrence, he may be able to come off [facility]   Ventricular tachycardia ([place]) 10/2015   Past Surgical History:  Procedure Laterality Date   CARDIAC CATHETERIZATION N/A 11/16/2015   Procedure: Left Heart Cath and Coronary Angiography;  Surgeon: Peter M Martinique, MD;  Location: [facility];  Service: Cardiovascular;  Laterality: N/A;   EP IMPLANTABLE DEVICE N/A 12/01/2015   Procedure: ICD Implant;  Surgeon: Javanni Maring Meredith Leeds, MD;  Location: [place] [facility];  Service: Cardiovascular;  Laterality: N/A;   KNEE ARTHROSCOPY Right 01/14/2020   Procedure: right knee arthroscopy partial medial meniscectomy, removal of loose body;  Surgeon: Marybelle Killings, MD;  Location: [facility];  Service: Orthopedics;  Laterality: Right;     Current Outpatient Medications  Medication Sig Dispense Refill   albuterol (VENTOLIN HFA) 108 (90 Base) MCG/ACT inhaler Inhale 2 puffs into  the lungs every 6 (six) hours as needed for wheezing or shortness of breath. 6.7 g 0   atorvastatin (LIPITOR) 10 MG tablet Take 10 mg by mouth daily.     fluticasone (FLONASE) 50 MCG/ACT nasal spray Place 1 spray into both nostrils daily.     levothyroxine (SYNTHROID) 75 MCG tablet Take 75 mcg by mouth daily before breakfast.      losartan (COZAAR) 25 MG  tablet TAKE 1/2 TABLET BY MOUTH EVERY DAY 45 tablet 3   metoprolol succinate (TOPROL-XL) 100 MG 24 hr tablet TAKE 1 TABLET BY MOUTH EVERY DAY 90 tablet 0   mexiletine (MEXITIL) 200 MG capsule TAKE 1 CAPSULE BY MOUTH TWICE A DAY 60 capsule 2   Multiple Vitamin (MULTIVITAMIN WITH MINERALS) TABS tablet Take 1 tablet by mouth daily. Centrum Silver     pantoprazole (PROTONIX) 40 MG tablet TAKE 1 TABLET BY MOUTH EVERY DAY 90 tablet 3   potassium chloride SA (KLOR-CON M20) 20 MEQ tablet TAKE 1 TABLET BY MOUTH TWICE A DAY 180 tablet 3   spironolactone (ALDACTONE) 25 MG tablet TAKE 1 TABLET (25 MG TOTAL) BY MOUTH DAILY. PLEASE KEEP 12/09/2020 APPOINTMENT FOR FURTHER REFILLS 90 tablet 3   warfarin (COUMADIN) 5 MG tablet TAKE 1 TO 1 AND 1/2 TABLETS BY MOUTH EVERY DAY AS DIRECTED BY COUMADIN CLINIC 135 tablet 0   No current facility-administered medications for this visit.    Allergies:   Amoxicillin, Heparin, and Heparin sodium (porcine)   Social History:  The patient  reports that he quit smoking about 28 years ago. His smoking use included cigarettes. He has never used smokeless tobacco. He reports that he does not drink alcohol and does not use drugs.   Family History:  The patient's family history includes Alzheimer's disease in his father; Heart attack in his mother.   ROS:  Please see the history of present illness.   Otherwise, review of systems is positive for none.   All other systems are reviewed and negative.   PHYSICAL EXAM: VS:  BP 108/76   Pulse (!) 56   Ht 5' 11.75" (1.822 m)   Wt 236 lb 6.4 oz (107.2 kg)   SpO2 95%   BMI 32.29 kg/m  , BMI Body mass index is 32.29 kg/m. GEN: Well nourished, well developed, in no acute distress  HEENT: normal  Neck: no JVD, carotid bruits, or masses Cardiac: RRR; no murmurs, rubs, or gallops,no edema  Respiratory:  clear to auscultation bilaterally, normal work of breathing GI: soft, nontender, nondistended, + BS MS: no deformity or atrophy   Skin: warm and dry, device site well healed Neuro:  Strength and sensation are intact Psych: euthymic mood, full affect  EKG:  EKG is ordered today. Personal review of the ekg ordered shows sinus rhythm  Personal review of the device interrogation today. Results in [place]: No results found for requested labs within last 8760 hours.    Lipid Panel     Component Value Date/Time   TRIG 136 10/06/2019 1340     Wt Readings from Last 3 Encounters:  08/18/21 236 lb 6.4 oz (107.2 kg)  12/09/20 234 lb (106.1 kg)  05/30/20 238 lb (108 kg)      Other studies Reviewed: Additional studies/ records that were reviewed today include: TTE 11/20/15, Cath 11/16/15  Review of the above records today demonstrates:  - Left ventricle: The cavity size was moderately dilated. There was   mild concentric hypertrophy. Systolic function was  severely   reduced. The estimated ejection fraction was in the range of 25%   to 30%. Diffuse hypokinesis. The study is not technically   sufficient to allow evaluation of LV diastolic function. - Aortic valve: Trileaflet; normal thickness leaflets. There was no   regurgitation. - Aortic root: The aortic root was normal in size. - Mitral valve: Structurally normal valve. There was no   regurgitation. - Left atrium: The atrium was moderately dilated. - Right ventricle: The cavity size was mildly dilated. Wall   thickness was normal. Systolic function was moderately reduced. - Right atrium: Pacer wire or catheter noted in right atrium. - Tricuspid valve: There was mild regurgitation. - Pulmonary arteries: Systolic pressure was within the normal   range. PA peak pressure: 33 mm Hg (S). - Inferior vena cava: The vessel was normal in size. - Pericardium, extracardiac: There was no pericardial effusion.  The left ventricular systolic function is normal. LV end diastolic pressure is normal. The left ventricular ejection fraction is 55-65% by visual  estimate.   1. Normal coronary anatomy 2. Normal LV function  TTE 03/23/16 - Left ventricle: The cavity size was normal. There was mild   concentric hypertrophy. Systolic function was normal. The   estimated ejection fraction was in the range of 55% to 60%. Wall   motion was normal; there were no regional wall motion   abnormalities. Doppler parameters are consistent with abnormal   left ventricular relaxation (grade 1 diastolic dysfunction). - Mitral valve: Mild, late systolicprolapse, involving the anterior   leaflet. - Left atrium: The atrium was mildly dilated.  ASSESSMENT AND PLAN:  1.  Cardiac arrest with ventricular fibrillation: Status post Medtronic ICD.  Currently on mexiletine 200 mg twice daily.  High risk medication monitoring for mexiletine.  No further arrhythmias.  No changes.  2.  Nonischemic cardiomyopathy: Ejection fraction has since improved.  No obvious volume overload.  3.  Paroxysmal atrial fibrillation: Currently on Coumadin.  CHA2DS2-VASc of 2.  Minimal noted on device interrogation.  4.  Secondary hypercoagulable state: Currently on warfarin for atrial fibrillation as above.  5.  Hypertension: well controlled   Current medicines are reviewed at length with the patient today.   The patient does not have concerns regarding his medicines.  The following changes were made today: none  Labs/ tests ordered today include:  Orders Placed This Encounter  Procedures   EKG 12-Lead     Disposition:   FU 12 months  Signed, Selisa Tensley Meredith Leeds, MD  08/18/2021 3:28 PM     [facility] [address] [place] [address] [phone] (office) [phone] (fax)

## 2021-08-24 ENCOUNTER — Other Ambulatory Visit: Payer: Self-pay | Admitting: Cardiology

## 2021-09-23 ENCOUNTER — Ambulatory Visit (INDEPENDENT_AMBULATORY_CARE_PROVIDER_SITE_OTHER): Payer: Medicare Other | Admitting: *Deleted

## 2021-09-23 DIAGNOSIS — I4891 Unspecified atrial fibrillation: Secondary | ICD-10-CM

## 2021-09-23 DIAGNOSIS — D75829 Heparin-induced thrombocytopenia, unspecified: Secondary | ICD-10-CM

## 2021-09-23 LAB — POCT INR: INR: 2.2 (ref 2.0–3.0)

## 2021-09-23 NOTE — Patient Instructions (Addendum)
Description   Continue taking Warfarin 1.5 tablets daily except 1 tablet on Mondays and Fridays. Recheck INR in 6 weeks at [facility] ([address]) Call Anticoagulation Clinic [phone]     

## 2021-10-01 ENCOUNTER — Other Ambulatory Visit: Payer: Self-pay | Admitting: Cardiology

## 2021-10-01 DIAGNOSIS — I4891 Unspecified atrial fibrillation: Secondary | ICD-10-CM

## 2021-10-01 NOTE — Telephone Encounter (Signed)
Prescription refill request received for warfarin Lov: [place], 08/18/2021 Next INR check: 8/16 Warfarin tablet strength: 5mg 

## 2021-10-05 ENCOUNTER — Other Ambulatory Visit: Payer: Self-pay | Admitting: Cardiology

## 2021-10-26 ENCOUNTER — Other Ambulatory Visit: Payer: Self-pay | Admitting: Cardiovascular Disease

## 2021-10-31 ENCOUNTER — Other Ambulatory Visit: Payer: Self-pay | Admitting: Cardiovascular Disease

## 2021-11-04 ENCOUNTER — Ambulatory Visit (INDEPENDENT_AMBULATORY_CARE_PROVIDER_SITE_OTHER): Payer: Medicare Other | Admitting: *Deleted

## 2021-11-04 ENCOUNTER — Ambulatory Visit (INDEPENDENT_AMBULATORY_CARE_PROVIDER_SITE_OTHER): Payer: Self-pay

## 2021-11-04 DIAGNOSIS — I4891 Unspecified atrial fibrillation: Secondary | ICD-10-CM | POA: Diagnosis not present

## 2021-11-04 DIAGNOSIS — D75829 Heparin-induced thrombocytopenia, unspecified: Secondary | ICD-10-CM

## 2021-11-04 DIAGNOSIS — I428 Other cardiomyopathies: Secondary | ICD-10-CM

## 2021-11-04 LAB — CUP PACEART REMOTE DEVICE CHECK
Battery Remaining Longevity: 68 mo
Battery Voltage: 2.98 V
Brady Statistic RV Percent Paced: 0.18 %
Date Time Interrogation Session: 20230816001804
HighPow Impedance: 78 Ohm
Implantable Lead Implant Date: 20170911
Implantable Lead Location: 753860
Implantable Pulse Generator Implant Date: 20170911
Lead Channel Impedance Value: 342 Ohm
Lead Channel Impedance Value: 456 Ohm
Lead Channel Pacing Threshold Amplitude: 0.625 V
Lead Channel Pacing Threshold Pulse Width: 0.4 ms
Lead Channel Sensing Intrinsic Amplitude: 9.5 mV
Lead Channel Sensing Intrinsic Amplitude: 9.5 mV
Lead Channel Setting Pacing Amplitude: 2.5 V
Lead Channel Setting Pacing Pulse Width: 0.4 ms
Lead Channel Setting Sensing Sensitivity: 0.3 mV

## 2021-11-04 LAB — POCT INR: INR: 2.3 (ref 2.0–3.0)

## 2021-11-04 NOTE — Patient Instructions (Signed)
Description   Continue taking Warfarin 1.5 tablets daily except 1 tablet on Mondays and Fridays. Recheck INR in 6 weeks at [facility] office ([address]) Call Anticoagulation Clinic [phone]     

## 2021-11-19 ENCOUNTER — Telehealth: Payer: Self-pay

## 2021-11-19 NOTE — Telephone Encounter (Signed)
Left message requesting call back to assess for afib symptoms based on last remote transmission.

## 2021-11-19 NOTE — Telephone Encounter (Signed)
Patient called back returning your call.

## 2021-11-19 NOTE — Telephone Encounter (Addendum)
Patient reports of doing well overall. States he has had some shortness of breath although has had increased activity working on his camper. Patient reports the heat has been difficult as well with his breathing.  Denies any other symptoms or complaints. Advised patient I will forward to Dr. Elberta Fortis and we will call back if any changes are warranted. Patient voiced understanding.   Appears patient is back in normal rhythm.

## 2021-11-25 NOTE — Telephone Encounter (Signed)
Noted patient is on coumadin. Called patient and confirmed he is on OAC.

## 2021-12-03 NOTE — Progress Notes (Signed)
Remote ICD transmission.   

## 2021-12-16 ENCOUNTER — Ambulatory Visit: Payer: Medicare Other | Attending: Cardiology | Admitting: *Deleted

## 2021-12-16 DIAGNOSIS — I4891 Unspecified atrial fibrillation: Secondary | ICD-10-CM | POA: Insufficient documentation

## 2021-12-16 DIAGNOSIS — D75829 Heparin-induced thrombocytopenia, unspecified: Secondary | ICD-10-CM | POA: Insufficient documentation

## 2021-12-16 LAB — POCT INR: INR: 2.2 (ref 2.0–3.0)

## 2021-12-16 NOTE — Patient Instructions (Signed)
Description   Continue taking Warfarin 1.5 tablets daily except 1 tablet on Mondays and Fridays. Recheck INR in 6 weeks at [facility] office ([place]) Call Anticoagulation Clinic [phone]

## 2021-12-22 ENCOUNTER — Encounter: Payer: Self-pay | Admitting: Cardiovascular Disease

## 2021-12-22 ENCOUNTER — Ambulatory Visit: Payer: Medicare Other | Attending: Cardiovascular Disease | Admitting: Cardiovascular Disease

## 2021-12-22 VITALS — BP 110/80 | HR 61 | Ht 71.75 in | Wt 234.0 lb

## 2021-12-22 DIAGNOSIS — I428 Other cardiomyopathies: Secondary | ICD-10-CM | POA: Diagnosis present

## 2021-12-22 DIAGNOSIS — E782 Mixed hyperlipidemia: Secondary | ICD-10-CM

## 2021-12-22 DIAGNOSIS — I480 Paroxysmal atrial fibrillation: Secondary | ICD-10-CM

## 2021-12-22 DIAGNOSIS — I10 Essential (primary) hypertension: Secondary | ICD-10-CM

## 2021-12-22 DIAGNOSIS — I472 Ventricular tachycardia, unspecified: Secondary | ICD-10-CM | POA: Diagnosis present

## 2021-12-22 NOTE — Progress Notes (Signed)
Cardiology Office Note:    Date:  12/22/2021   ID:  Arthur Chase, DOB 1955-09-16, MRN [id number]  PCP:  Shon Hale, MD   [facility] Providers Cardiologist:  Tonny Bollman, MD Electrophysiologist:  Regan Lemming, MD     Referring MD: Shon Hale, *   Chief Complaint  Patient presents with   Annual Exam   Atrial Fibrillation    History of Present Illness:    Arthur Chase is a 66 y.o. male with a hx of: HFimpEF (heart failure with improved ejection fraction)  Non-ischemic cardiomyopathy  Echocardiogram 8/17: EF < 20 CMR 9/17: EF 68 Echocardiogram 7/21: EF 55-60 Hx of OOH cardiac arrest S/p ICD Ventricular tachycardia Mexiletine Rx  Paroxysmal atrial fibrillation  Warfarin Rx Hx of HIT Hypertension  Hx of COVID-19 pneumonia C/b AF w RVR  The patient is here alone today.  Has been doing fairly well.  He gets out and walks with his son most mornings, walking about a half a mile in his neighborhood.  He has mild exertional dyspnea which is unchanged over time.  He denies chest pain or pressure.  No edema, orthopnea, or PND.  He continues to follow with Dr. Elberta Fortis after undergoing ICD implantation at the time of his out of hospital cardiac arrest in 2017.  He complains of some limitations related to neck and shoulder problems.  No other cardiac-related complaints at this time.  No recent palpitations, lightheadedness, or syncope.  Past Medical History:  Diagnosis Date   AICD (automatic cardioverter/defibrillator) present    Anoxic encephalopathy (HCC) 10/2015   a. 10/2015 in setting of cardiac arrest - recovered prior to DC.   Cardiac arrest with ventricular fibrillation (HCC)    a. 11/2015: Unclear etiology. Nl cors on cath 8/27. S/P cooling. On amiodarone and s/p ICD for secondary prevention on 12/01/15. EF down to 25-30% but normalized to 68 by subsequent cardiac MR   COVID-19 10/06/2019   HIT (heparin-induced thrombocytopenia)  (HCC)    a. 11/2015 during admission for VF arrest. HIT panel positive. Placed on coumadin for 2 months   HTN (hypertension)    Hypokalemia    a. 11/2015: resolved on K supplementation and spironolactone   Hypothyroidism    ICD (implantable cardioverter-defibrillator) in place    a. Medtronic Visia AF MRI (serial  Number [id number]) ICD.   Myocardial stunning    a. 11/2015: EF down to 20-25% after VF arrest but improved to ~68 by subsequent cardiac MR.   NICM (nonischemic cardiomyopathy) (HCC)    Obesity    PAF (paroxysmal atrial fibrillation) (HCC)    a. 11/2015 brief run of afib with RVR while intubated during a complicated admission for VF arrest and VDRF. No recurrence on amio. CHADSVASC 2 (CHF, HTN) and on coumadin for HIT with positive thrombotic markers. If no long term recurrence, he may be able to come off [facility]   Ventricular tachycardia (HCC) 10/2015    Past Surgical History:  Procedure Laterality Date   CARDIAC CATHETERIZATION N/A 11/16/2015   Procedure: Left Heart Cath and Coronary Angiography;  Surgeon: Peter M Swaziland, MD;  Location: [facility] INVASIVE CV LAB;  Service: Cardiovascular;  Laterality: N/A;   EP IMPLANTABLE DEVICE N/A 12/01/2015   Procedure: ICD Implant;  Surgeon: Will [name], MD;  Location: [facility] INVASIVE CV LAB;  Service: Cardiovascular;  Laterality: N/A;   KNEE ARTHROSCOPY Right 01/14/2020   Procedure: right knee arthroscopy partial medial meniscectomy, removal of loose body;  Surgeon:  Eldred Manges, MD;  Location: [facility] OR;  Service: Orthopedics;  Laterality: Right;    Current Medications: Current Meds  Medication Sig   albuterol (VENTOLIN HFA) 108 (90 Base) MCG/ACT inhaler Inhale 2 puffs into the lungs every 6 (six) hours as needed for wheezing or shortness of breath.   atorvastatin (LIPITOR) 10 MG tablet Take 10 mg by mouth daily.   fluticasone (FLONASE) 50 MCG/ACT nasal spray Place 1 spray into both nostrils daily.   levothyroxine (SYNTHROID) 75 MCG tablet Take 75  mcg by mouth daily before breakfast.    losartan (COZAAR) 25 MG tablet TAKE 1/2 TABLET BY MOUTH EVERY DAY   metoprolol succinate (TOPROL-XL) 100 MG 24 hr tablet TAKE 1 TABLET BY MOUTH EVERY DAY   mexiletine (MEXITIL) 200 MG capsule TAKE 1 CAPSULE BY MOUTH TWICE A DAY   Multiple Vitamin (MULTIVITAMIN WITH MINERALS) TABS tablet Take 1 tablet by mouth daily. Centrum Silver   pantoprazole (PROTONIX) 40 MG tablet TAKE 1 TABLET BY MOUTH EVERY DAY   potassium chloride SA (KLOR-CON M20) 20 MEQ tablet TAKE 1 TABLET BY MOUTH TWICE A DAY   spironolactone (ALDACTONE) 25 MG tablet TAKE 1 TABLET (25 MG TOTAL) BY MOUTH DAILY. PLEASE KEEP 12/09/2020 APPOINTMENT FOR FURTHER REFILLS   warfarin (COUMADIN) 5 MG tablet TAKE 1 TO 1 AND 1/2 TABLETS BY MOUTH EVERY DAY AS DIRECTED BY COUMADIN CLINIC     Allergies:   Amoxicillin, Heparin, and Heparin sodium (porcine)   Social History   Socioeconomic History   Marital status: Divorced    Spouse name: Not on file   Number of children: Not on file   Years of education: Not on file   Highest education level: Not on file  Occupational History   Occupation: Engineer, site  Tobacco Use   Smoking status: Former    Types: Cigarettes    Quit date: 1995    Years since quitting: 28.7   Smokeless tobacco: Never  Vaping Use   Vaping Use: Never used  Substance and Sexual Activity   Alcohol use: No    Comment: quit at age 56   Drug use: No   Sexual activity: Not on file  Other Topics Concern   Not on file  Social History Narrative   Not on file   Social Determinants of Health   Financial Resource Strain: Not on file  Food Insecurity: Not on file  Transportation Needs: Not on file  Physical Activity: Not on file  Stress: Not on file  Social Connections: Not on file     Family History: The patient's family history includes Alzheimer's disease in his father; Heart attack in his mother.  ROS:   Please see the history of present illness.    All other  systems reviewed and are negative.  EKGs/Labs/Other Studies Reviewed:    The following studies were reviewed today: Echo 10/08/2019: 1. Left ventricular ejection fraction, by estimation, is 55 to 60%. The  left ventricle has normal function. The left ventricle has no regional  wall motion abnormalities. Left ventricular diastolic function could not  be evaluated.   2. Right ventricular systolic function is normal. The right ventricular  size is normal. There is normal pulmonary artery systolic pressure.   3. The mitral valve is abnormal. No evidence of mitral stenosis.   4. The aortic valve is tricuspid. Aortic valve regurgitation is not  visualized. No aortic stenosis is present.   5. The inferior vena cava is normal in size with greater than  50%  respiratory variability, suggesting right atrial pressure of 3 mmHg.   Comparison(s): No significant change from prior study.   Conclusion(s)/Recommendation(s): Normal biventricular function without  evidence of hemodynamically significant valvular heart disease.   Cardiac Cath 11/16/2015: The left ventricular systolic function is normal. LV end diastolic pressure is normal. The left ventricular ejection fraction is 55-65% by visual estimate.   1. Normal coronary anatomy 2. Normal LV function   Plan: monitor in ICU. Further work up and management per CCM.  EKG:  EKG is ordered today.  The ekg ordered today demonstrates normal sinus rhythm 61 bpm, first-degree AV block, left axis deviation.  Nonspecific IVCD.  Recent Labs: No results found for requested labs within last 365 days.  Recent Lipid Panel    Component Value Date/Time   TRIG 136 10/06/2019 1340     Risk Assessment/Calculations:    CHA2DS2-VASc Score = 3   This indicates a 3.2% annual risk of stroke. The patient's score is based upon: CHF History: 1 HTN History: 1 Diabetes History: 0 Stroke History: 0 Vascular Disease History: 0 Age Score: 1 Gender Score: 0                Physical Exam:    VS:  BP 110/80 (BP Location: Left Arm, Patient Position: Sitting, Cuff Size: Normal)   Pulse 61   Ht 5' 11.75" (1.822 m)   Wt 234 lb (106.1 kg)   SpO2 95%   BMI 31.96 kg/m     Wt Readings from Last 3 Encounters:  12/22/21 234 lb (106.1 kg)  08/18/21 236 lb 6.4 oz (107.2 kg)  12/09/20 234 lb (106.1 kg)     GEN:  Well nourished, well developed in no acute distress HEENT: Normal NECK: No JVD; No carotid bruits LYMPHATICS: No lymphadenopathy CARDIAC: RRR, no murmurs, rubs, gallops RESPIRATORY:  Clear to auscultation without rales, wheezing or rhonchi  ABDOMEN: Soft, non-tender, non-distended MUSCULOSKELETAL:  No edema; No deformity.  Prominent varicosities in the left leg SKIN: Warm and dry NEUROLOGIC:  Alert and oriented x 3 PSYCHIATRIC:  Normal affect   ASSESSMENT:    1. Paroxysmal atrial fibrillation (HCC)   2. NICM (nonischemic cardiomyopathy) ([place])   3. Essential hypertension   4. Mixed hyperlipidemia   5. VT (ventricular tachycardia) (HCC)    PLAN:    In order of problems listed above:  This has been picked up on device monitoring.  The patient is anticoagulated with warfarin.  He is asymptomatic. LVEF has recovered.  His last echocardiogram from July 2021 is reviewed and demonstrates normal LV and RV function.  He remains on metoprolol and losartan. Blood pressure is well controlled on losartan and metoprolol as well as spironolactone. Patient is on atorvastatin.  Last lipids with a cholesterol of 138, HDL 31, LDL 76.  Continue current therapy. Followed by Dr. Curt Bears.  Treated with mexiletine.     Medication Adjustments/Labs and Tests Ordered: Current medicines are reviewed at length with the patient today.  Concerns regarding medicines are outlined above.  Orders Placed This Encounter  Procedures   EKG 12-Lead   No orders of the defined types were placed in this encounter.   Patient Instructions  Medication  Instructions:  Your physician recommends that you continue on your current medications as directed. Please refer to the Current Medication list given to you today.  *If you need a refill on your cardiac medications before your next appointment, please call your pharmacy*   Lab Work: NONE If you  have labs (blood work) drawn today and your tests are completely normal, you will receive your results only by: MyChart Message (if you have MyChart) OR A paper copy in the mail If you have any lab test that is abnormal or we need to change your treatment, we will call you to review the results.   Testing/Procedures: NONE   Follow-Up: At [facility], you and your health needs are our priority.  As part of our continuing mission to provide you with exceptional heart care, we have created designated Provider Care Teams.  These Care Teams include your primary Cardiologist (physician) and Advanced Practice Providers (APPs -  Physician Assistants and Nurse Practitioners) who all work together to provide you with the care you need, when you need it.  Your next appointment:   1 year(s)  The format for your next appointment:   In Person  Provider:   Tonny Bollman, MD   or APP    Important Information About Sugar         Signed, Tonny Bollman, MD  12/22/2021 1:43 PM    [facility]

## 2021-12-22 NOTE — Patient Instructions (Signed)
Medication Instructions:  Your physician recommends that you continue on your current medications as directed. Please refer to the Current Medication list given to you today.  *If you need a refill on your cardiac medications before your next appointment, please call your pharmacy*   Lab Work: NONE If you have labs (blood work) drawn today and your tests are completely normal, you will receive your results only by: MyChart Message (if you have MyChart) OR A paper copy in the mail If you have any lab test that is abnormal or we need to change your treatment, we will call you to review the results.   Testing/Procedures: NONE   Follow-Up: At [facility], you and your health needs are our priority.  As part of our continuing mission to provide you with exceptional heart care, we have created designated Provider Care Teams.  These Care Teams include your primary Cardiologist (physician) and Advanced Practice Providers (APPs -  Physician Assistants and Nurse Practitioners) who all work together to provide you with the care you need, when you need it.  Your next appointment:   1 year(s)  The format for your next appointment:   In Person  Provider:   Michael Cooper, MD  or APP     Important Information About Sugar       

## 2021-12-23 ENCOUNTER — Other Ambulatory Visit: Payer: Self-pay | Admitting: Cardiology

## 2021-12-23 DIAGNOSIS — I4891 Unspecified atrial fibrillation: Secondary | ICD-10-CM

## 2022-01-21 ENCOUNTER — Encounter: Payer: Self-pay | Admitting: Cardiology

## 2022-01-27 ENCOUNTER — Ambulatory Visit: Payer: Medicare Other | Attending: Cardiology | Admitting: *Deleted

## 2022-01-27 DIAGNOSIS — D75829 Heparin-induced thrombocytopenia, unspecified: Secondary | ICD-10-CM | POA: Diagnosis not present

## 2022-01-27 DIAGNOSIS — I4891 Unspecified atrial fibrillation: Secondary | ICD-10-CM

## 2022-01-27 LAB — POCT INR: INR: 2.1 (ref 2.0–3.0)

## 2022-01-27 NOTE — Patient Instructions (Signed)
Description   Continue taking Warfarin 1.5 tablets daily except 1 tablet on Mondays and Fridays. Recheck INR in 6 weeks at [facility] office ([address]) Call Anticoagulation Clinic [phone]

## 2022-02-03 ENCOUNTER — Ambulatory Visit (INDEPENDENT_AMBULATORY_CARE_PROVIDER_SITE_OTHER): Payer: Medicare Other

## 2022-02-03 DIAGNOSIS — Z95810 Presence of automatic (implantable) cardiac defibrillator: Secondary | ICD-10-CM | POA: Diagnosis not present

## 2022-02-03 DIAGNOSIS — I428 Other cardiomyopathies: Secondary | ICD-10-CM

## 2022-02-03 LAB — CUP PACEART REMOTE DEVICE CHECK
Battery Remaining Longevity: 65 mo
Battery Voltage: 2.98 V
Brady Statistic RV Percent Paced: 0.09 %
Date Time Interrogation Session: 20231115012206
HighPow Impedance: 86 Ohm
Implantable Lead Connection Status: 753985
Implantable Lead Implant Date: 20170911
Implantable Lead Location: 753860
Implantable Pulse Generator Implant Date: 20170911
Lead Channel Impedance Value: 342 Ohm
Lead Channel Impedance Value: 437 Ohm
Lead Channel Pacing Threshold Amplitude: 0.5 V
Lead Channel Pacing Threshold Pulse Width: 0.4 ms
Lead Channel Sensing Intrinsic Amplitude: 8.875 mV
Lead Channel Sensing Intrinsic Amplitude: 8.875 mV
Lead Channel Setting Pacing Amplitude: 2.5 V
Lead Channel Setting Pacing Pulse Width: 0.4 ms
Lead Channel Setting Sensing Sensitivity: 0.3 mV
Zone Setting Status: 755011
Zone Setting Status: 755011

## 2022-02-26 NOTE — Progress Notes (Signed)
Remote ICD transmission.   

## 2022-03-10 ENCOUNTER — Ambulatory Visit: Payer: Medicare Other | Attending: Cardiology

## 2022-03-10 DIAGNOSIS — D75829 Heparin-induced thrombocytopenia, unspecified: Secondary | ICD-10-CM

## 2022-03-10 DIAGNOSIS — I4891 Unspecified atrial fibrillation: Secondary | ICD-10-CM

## 2022-03-10 LAB — POCT INR: INR: 2.5 (ref 2.0–3.0)

## 2022-03-10 NOTE — Patient Instructions (Signed)
Description   Continue taking Warfarin 1.5 tablets daily except 1 tablet on Mondays and Fridays.  Recheck INR in 7 weeks at [facility] office ([address]) Call Anticoagulation Clinic [phone]     

## 2022-03-29 DIAGNOSIS — M65331 Trigger finger, right middle finger: Secondary | ICD-10-CM | POA: Diagnosis not present

## 2022-04-28 ENCOUNTER — Ambulatory Visit: Payer: Medicare (Managed Care) | Attending: Cardiovascular Disease

## 2022-04-28 DIAGNOSIS — D75829 Heparin-induced thrombocytopenia, unspecified: Secondary | ICD-10-CM

## 2022-04-28 DIAGNOSIS — I4891 Unspecified atrial fibrillation: Secondary | ICD-10-CM | POA: Diagnosis not present

## 2022-04-28 LAB — POCT INR: INR: 2.2 (ref 2.0–3.0)

## 2022-04-28 NOTE — Patient Instructions (Signed)
Description   Continue taking Warfarin 1.5 tablets daily except 1 tablet on Mondays and Fridays.  Recheck INR in 7 weeks at [facility] office ([address]) Call Anticoagulation Clinic [phone]     

## 2022-05-05 ENCOUNTER — Ambulatory Visit: Payer: Medicare (Managed Care)

## 2022-05-05 DIAGNOSIS — I428 Other cardiomyopathies: Secondary | ICD-10-CM

## 2022-05-05 LAB — CUP PACEART REMOTE DEVICE CHECK
Battery Remaining Longevity: 59 mo
Battery Voltage: 2.98 V
Brady Statistic RV Percent Paced: 0.07 %
Date Time Interrogation Session: 20240214022823
HighPow Impedance: 83 Ohm
Implantable Lead Connection Status: 753985
Implantable Lead Implant Date: 20170911
Implantable Lead Location: 753860
Implantable Pulse Generator Implant Date: 20170911
Lead Channel Impedance Value: 342 Ohm
Lead Channel Impedance Value: 456 Ohm
Lead Channel Pacing Threshold Amplitude: 0.625 V
Lead Channel Pacing Threshold Pulse Width: 0.4 ms
Lead Channel Sensing Intrinsic Amplitude: 9.125 mV
Lead Channel Sensing Intrinsic Amplitude: 9.125 mV
Lead Channel Setting Pacing Amplitude: 2.5 V
Lead Channel Setting Pacing Pulse Width: 0.4 ms
Lead Channel Setting Sensing Sensitivity: 0.3 mV
Zone Setting Status: 755011
Zone Setting Status: 755011

## 2022-05-27 DIAGNOSIS — M65331 Trigger finger, right middle finger: Secondary | ICD-10-CM | POA: Diagnosis not present

## 2022-06-04 NOTE — Progress Notes (Signed)
Remote ICD transmission.   

## 2022-06-16 ENCOUNTER — Ambulatory Visit: Payer: Medicare (Managed Care) | Attending: Cardiology

## 2022-06-16 DIAGNOSIS — D75829 Heparin-induced thrombocytopenia, unspecified: Secondary | ICD-10-CM | POA: Diagnosis not present

## 2022-06-16 DIAGNOSIS — I4891 Unspecified atrial fibrillation: Secondary | ICD-10-CM | POA: Diagnosis not present

## 2022-06-16 LAB — POCT INR: INR: 2.5 (ref 2.0–3.0)

## 2022-06-16 NOTE — Patient Instructions (Signed)
Description   Continue taking Warfarin 1.5 tablets daily except 1 tablet on Mondays and Fridays.  Recheck INR in 7 weeks at [employer] [facility] office ([place]) Call Anticoagulation Clinic [phone]

## 2022-06-17 ENCOUNTER — Other Ambulatory Visit: Payer: Self-pay | Admitting: Cardiology

## 2022-06-17 DIAGNOSIS — I4891 Unspecified atrial fibrillation: Secondary | ICD-10-CM

## 2022-06-17 NOTE — Telephone Encounter (Signed)
Prescription refill request received for warfarin Lov: 12/22/21 Burt Knack)  Next INR check: 08/04/22 Warfarin tablet strength: 5mg   Appropriate dose. Refill sent.

## 2022-06-27 ENCOUNTER — Other Ambulatory Visit: Payer: Self-pay | Admitting: Cardiology

## 2022-08-04 ENCOUNTER — Ambulatory Visit: Payer: Medicare (Managed Care) | Attending: Cardiology

## 2022-08-04 ENCOUNTER — Ambulatory Visit (INDEPENDENT_AMBULATORY_CARE_PROVIDER_SITE_OTHER): Payer: Medicare (Managed Care)

## 2022-08-04 DIAGNOSIS — I4891 Unspecified atrial fibrillation: Secondary | ICD-10-CM

## 2022-08-04 DIAGNOSIS — D75829 Heparin-induced thrombocytopenia, unspecified: Secondary | ICD-10-CM

## 2022-08-04 DIAGNOSIS — I428 Other cardiomyopathies: Secondary | ICD-10-CM

## 2022-08-04 LAB — CUP PACEART REMOTE DEVICE CHECK
Battery Remaining Longevity: 53 mo
Battery Voltage: 2.97 V
Brady Statistic RV Percent Paced: 0.1 %
Date Time Interrogation Session: 20240515063527
HighPow Impedance: 84 Ohm
Implantable Lead Connection Status: 753985
Implantable Lead Implant Date: 20170911
Implantable Lead Location: 753860
Implantable Pulse Generator Implant Date: 20170911
Lead Channel Impedance Value: 342 Ohm
Lead Channel Impedance Value: 437 Ohm
Lead Channel Pacing Threshold Amplitude: 0.625 V
Lead Channel Pacing Threshold Pulse Width: 0.4 ms
Lead Channel Sensing Intrinsic Amplitude: 7.375 mV
Lead Channel Sensing Intrinsic Amplitude: 7.375 mV
Lead Channel Setting Pacing Amplitude: 2.5 V
Lead Channel Setting Pacing Pulse Width: 0.4 ms
Lead Channel Setting Sensing Sensitivity: 0.3 mV
Zone Setting Status: 755011
Zone Setting Status: 755011

## 2022-08-04 LAB — POCT INR: INR: 2.4 (ref 2.0–3.0)

## 2022-08-04 NOTE — Patient Instructions (Signed)
Description   Continue taking Warfarin 1.5 tablets daily except 1 tablet on Mondays and Fridays.  Recheck INR in 7 weeks at [facility] office ([address]) Call Anticoagulation Clinic [phone]      

## 2022-08-06 DIAGNOSIS — L03114 Cellulitis of left upper limb: Secondary | ICD-10-CM | POA: Diagnosis not present

## 2022-08-10 DIAGNOSIS — I480 Paroxysmal atrial fibrillation: Secondary | ICD-10-CM | POA: Diagnosis not present

## 2022-08-10 DIAGNOSIS — M7989 Other specified soft tissue disorders: Secondary | ICD-10-CM | POA: Diagnosis not present

## 2022-08-10 DIAGNOSIS — D6869 Other thrombophilia: Secondary | ICD-10-CM | POA: Diagnosis not present

## 2022-08-11 DIAGNOSIS — L03114 Cellulitis of left upper limb: Secondary | ICD-10-CM | POA: Diagnosis not present

## 2022-08-17 ENCOUNTER — Telehealth: Payer: Self-pay | Admitting: *Deleted

## 2022-08-17 DIAGNOSIS — I4891 Unspecified atrial fibrillation: Secondary | ICD-10-CM | POA: Diagnosis not present

## 2022-08-17 DIAGNOSIS — Z7901 Long term (current) use of anticoagulants: Secondary | ICD-10-CM | POA: Diagnosis not present

## 2022-08-17 DIAGNOSIS — Z01818 Encounter for other preprocedural examination: Secondary | ICD-10-CM

## 2022-08-17 DIAGNOSIS — K219 Gastro-esophageal reflux disease without esophagitis: Secondary | ICD-10-CM | POA: Diagnosis not present

## 2022-08-17 DIAGNOSIS — I480 Paroxysmal atrial fibrillation: Secondary | ICD-10-CM

## 2022-08-17 DIAGNOSIS — Z86010 Personal history of colonic polyps: Secondary | ICD-10-CM | POA: Diagnosis not present

## 2022-08-17 DIAGNOSIS — Z5181 Encounter for therapeutic drug level monitoring: Secondary | ICD-10-CM

## 2022-08-17 NOTE — Telephone Encounter (Signed)
Pt agreeable to come in tomorrow for labs for pre op, bmet/cbc. Orders have been placed. I asked the pt if he was aware that he was also scheduled to see Dr. Elberta Fortis same day as his colonoscopy. I informed the pt that I will let Dr. Elberta Fortis scheduler know to call him and change Dr. Elberta Fortis appt. Pt said thank you for the help.

## 2022-08-17 NOTE — Telephone Encounter (Signed)
   Pre-operative Risk Assessment    Patient Name: Arthur Chase  DOB: [birth year] MRN: [id number]      Request for Surgical Clearance    Procedure:   COLONOSCOPY  Date of Surgery:  Clearance 09/01/22                                 Surgeon:  DR. Levora Angel Surgeon's Group or Practice Name:  [facility] Phone number:  [phone] Fax number:  [phone]   Type of Clearance Requested:   - Medical  - Pharmacy:  Hold Warfarin (Coumadin)     Type of Anesthesia:  Not Indicated (PROPOFOL?)   Additional requests/questions:    Elpidio Anis   08/17/2022, 4:11 PM

## 2022-08-17 NOTE — Telephone Encounter (Signed)
Callback team please schedule patient for updated CBC and BMET prior to clearance being provided for holding warfarin.  Thank you

## 2022-08-17 NOTE — Addendum Note (Signed)
Addended by: Tarri Fuller on: 08/17/2022 05:09 PM   Modules accepted: Orders

## 2022-08-18 ENCOUNTER — Ambulatory Visit: Payer: Medicare (Managed Care) | Attending: Cardiology

## 2022-08-18 DIAGNOSIS — Z01818 Encounter for other preprocedural examination: Secondary | ICD-10-CM

## 2022-08-18 DIAGNOSIS — I480 Paroxysmal atrial fibrillation: Secondary | ICD-10-CM

## 2022-08-18 DIAGNOSIS — Z5181 Encounter for therapeutic drug level monitoring: Secondary | ICD-10-CM

## 2022-08-19 LAB — BASIC METABOLIC PANEL
BUN/Creatinine Ratio: 13 (ref 10–24)
BUN: 12 mg/dL (ref 8–27)
CO2: 19 mmol/L — ABNORMAL LOW (ref 20–29)
Calcium: 9.7 mg/dL (ref 8.6–10.2)
Chloride: 104 mmol/L (ref 96–106)
Creatinine, Ser: 0.9 mg/dL (ref 0.76–1.27)
Glucose: 99 mg/dL (ref 70–99)
Potassium: 4.2 mmol/L (ref 3.5–5.2)
Sodium: 138 mmol/L (ref 134–144)
eGFR: 94 mL/min/{1.73_m2} (ref >59)

## 2022-08-19 LAB — CBC
Hematocrit: 43.7 % (ref 37.5–51.0)
Hemoglobin: 14.9 g/dL (ref 13.0–17.7)
MCH: 31 pg (ref 26.6–33.0)
MCHC: 34.1 g/dL (ref 31.5–35.7)
MCV: 91 fL (ref 79–97)
Platelets: 238 10*3/uL (ref 150–450)
RBC: 4.8 x10E6/uL (ref 4.14–5.80)
RDW: 12.3 % (ref 11.6–15.4)
WBC: 9.6 10*3/uL (ref 3.4–10.8)

## 2022-08-19 NOTE — Progress Notes (Signed)
Remote ICD transmission.   

## 2022-08-20 ENCOUNTER — Telehealth: Payer: Self-pay | Admitting: *Deleted

## 2022-08-20 NOTE — Telephone Encounter (Signed)
Pt has been scheduled for tele pre op appt 08/25/22 @ 10:40. Med rec and consent are done. Pt is added on due to date of procedure and med hold.

## 2022-08-20 NOTE — Telephone Encounter (Signed)
Pt has been scheduled for tele pre op appt 08/25/22 @ 10:40. Med rec and consent are done. Pt is added on due to date of procedure and med hold.      Patient Consent for Virtual Visit         Arthur Chase has provided verbal consent on 08/20/2022 for a virtual visit (video or telephone).   CONSENT FOR VIRTUAL VISIT FOR:  Arthur Chase  By participating in this virtual visit I agree to the following:  I hereby voluntarily request, consent and authorize [facility] and its employed or contracted physicians, physician assistants, nurse practitioners or other licensed health care professionals (the Practitioner), to provide me with telemedicine health care services (the "Services") as deemed necessary by the treating Practitioner. I acknowledge and consent to receive the Services by the Practitioner via telemedicine. I understand that the telemedicine visit will involve communicating with the Practitioner through live audiovisual communication technology and the disclosure of certain medical information by electronic transmission. I acknowledge that I have been given the opportunity to request an in-person assessment or other available alternative prior to the telemedicine visit and am voluntarily participating in the telemedicine visit.  I understand that I have the right to withhold or withdraw my consent to the use of telemedicine in the course of my care at any time, without affecting my right to future care or treatment, and that the Practitioner or I may terminate the telemedicine visit at any time. I understand that I have the right to inspect all information obtained and/or recorded in the course of the telemedicine visit and may receive copies of available information for a reasonable fee.  I understand that some of the potential risks of receiving the Services via telemedicine include:  Delay or interruption in medical evaluation due to technological equipment failure or  disruption; Information transmitted may not be sufficient (e.g. poor resolution of images) to allow for appropriate medical decision making by the Practitioner; and/or  In rare instances, security protocols could fail, causing a breach of personal health information.  Furthermore, I acknowledge that it is my responsibility to provide information about my medical history, conditions and care that is complete and accurate to the best of my ability. I acknowledge that Practitioner's advice, recommendations, and/or decision may be based on factors not within their control, such as incomplete or inaccurate data provided by me or distortions of diagnostic images or specimens that may result from electronic transmissions. I understand that the practice of medicine is not an exact science and that Practitioner makes no warranties or guarantees regarding treatment outcomes. I acknowledge that a copy of this consent can be made available to me via my patient portal [facility] MyChart), or I can request a printed copy by calling the office of [facility].    I understand that my insurance will be billed for this visit.   I have read or had this consent read to me. I understand the contents of this consent, which adequately explains the benefits and risks of the Services being provided via telemedicine.  I have been provided ample opportunity to ask questions regarding this consent and the Services and have had my questions answered to my satisfaction. I give my informed consent for the services to be provided through the use of telemedicine in my medical care

## 2022-08-20 NOTE — Telephone Encounter (Signed)
   Name: Arthur Chase  DOB: [birth year]  MRN: [id number]  Primary Cardiologist: Tonny Bollman, MD   Preoperative team, please contact this patient and set up a phone call appointment for further preoperative risk assessment. Please obtain consent and complete medication review. Thank you for your help.  I confirm that guidance regarding antiplatelet and oral anticoagulation therapy has been completed and, if necessary, noted below.  Per office protocol, patient can hold warfarin for 5 days prior to procedure.   Patient will not need bridging with Lovenox (enoxaparin) around procedure.     Ronney Asters, NP 08/20/2022, 9:45 AM [facility]

## 2022-08-20 NOTE — Telephone Encounter (Signed)
Patient with diagnosis of atrial fibrillation on warfarin for anticoagulation.    Procedure: colonoscopoy Date of procedure: 09/01/22   CHA2DS2-VASc Score = 3   This indicates a 3.2% annual risk of stroke. The patient's score is based upon: CHF History: 1 HTN History: 1 Diabetes History: 0 Stroke History: 0 Vascular Disease History: 0 Age Score: 1 Gender Score: 0    CrCl 120 Platelet count 238  Per office protocol, patient can hold warfarin for 5 days prior to procedure.   Patient will not need bridging with Lovenox (enoxaparin) around procedure.  **This guidance is not considered finalized until pre-operative APP has relayed final recommendations.**

## 2022-08-25 ENCOUNTER — Ambulatory Visit: Payer: Medicare (Managed Care) | Attending: Cardiology | Admitting: Student

## 2022-08-25 DIAGNOSIS — Z01810 Encounter for preprocedural cardiovascular examination: Secondary | ICD-10-CM | POA: Diagnosis not present

## 2022-08-25 NOTE — Progress Notes (Signed)
Virtual Visit via Telephone Note   Because of Arthur Chase's co-morbid illnesses, he is at least at moderate risk for complications without adequate follow up.  This format is felt to be most appropriate for this patient at this time.  The patient did not have access to video technology/had technical difficulties with video requiring transitioning to audio format only (telephone).  All issues noted in this document were discussed and addressed.  No physical exam could be performed with this format.  Please refer to the patient's chart for his consent to telehealth for [facility].  Evaluation Performed:  Preoperative cardiovascular risk assessment _____________   Date:  08/25/2022   Patient ID:  Arthur Chase, DOB 01/30/1956, MRN [id number] Patient Location:  Home Provider location:   Office  Primary Care Provider:  Shon Hale, MD Primary Cardiologist:  Tonny Bollman, MD  Chief Complaint / Patient Profile   67 y.o. y/o male with a h/o cardiac arrest with V-fib September 2017, PAF on anticoagulation, chronic systolic heart failure s/p ICD, HIT who is pending colonoscopy by Dr. Levora Angel and presents today for telephonic preoperative cardiovascular risk assessment.  History of Present Illness    Arthur Chase is a 67 y.o. male who presents via audio/video conferencing for a telehealth visit today.  Pt was last seen in cardiology clinic on 12/22/2021 by Dr. Excell Seltzer.  At that time Arthur Chase was doing well.  The patient is now pending procedure as outlined above. Since his last visit, he is doing well from a cardiac perspective. Patient denies shortness of breath or dyspnea on exertion. No chest pain, pressure, or tightness. Denies lower extremity edema, orthopnea, or PND. No palpitations. He stays active walking his grandson's dog several times a day.  Past Medical History    Past Medical History:  Diagnosis Date   AICD (automatic  cardioverter/defibrillator) present    Anoxic encephalopathy (HCC) 10/2015   a. 10/2015 in setting of cardiac arrest - recovered prior to DC.   Cardiac arrest with ventricular fibrillation (HCC)    a. 11/2015: Unclear etiology. Nl cors on cath 8/27. S/P cooling. On amiodarone and s/p ICD for secondary prevention on 12/01/15. EF down to 25-30% but normalized to 68 by subsequent cardiac MR   COVID-19 10/06/2019   HIT (heparin-induced thrombocytopenia) (HCC)    a. 11/2015 during admission for VF arrest. HIT panel positive. Placed on coumadin for 2 months   HTN (hypertension)    Hypokalemia    a. 11/2015: resolved on K supplementation and spironolactone   Hypothyroidism    ICD (implantable cardioverter-defibrillator) in place    a. Medtronic Visia AF MRI (serial  Number [id number]) ICD.   Myocardial stunning    a. 11/2015: EF down to 20-25% after VF arrest but improved to ~68 by subsequent cardiac MR.   NICM (nonischemic cardiomyopathy) (HCC)    Obesity    PAF (paroxysmal atrial fibrillation) (HCC)    a. 11/2015 brief run of afib with RVR while intubated during a complicated admission for VF arrest and VDRF. No recurrence on amio. CHADSVASC 2 (CHF, HTN) and on coumadin for HIT with positive thrombotic markers. If no long term recurrence, he may be able to come off [facility]   Ventricular tachycardia (HCC) 10/2015   Past Surgical History:  Procedure Laterality Date   CARDIAC CATHETERIZATION N/A 11/16/2015   Procedure: Left Heart Cath and Coronary Angiography;  Surgeon: Peter M Swaziland, MD;  [facility] INVASIVE CV LAB;  Service: Cardiovascular;  Laterality: N/A;   EP IMPLANTABLE DEVICE N/A 12/01/2015   Procedure: ICD Implant;  Surgeon: Will [name], MD;  Location: [facility] INVASIVE CV LAB;  Service: Cardiovascular;  Laterality: N/A;   KNEE ARTHROSCOPY Right 01/14/2020   Procedure: right knee arthroscopy partial medial meniscectomy, removal of loose body;  Surgeon: Eldred Manges, MD;  Location: [facility] OR;   Service: Orthopedics;  Laterality: Right;    Allergies  Allergies  Allergen Reactions   Amoxicillin     Other reaction(s): headaches   Heparin Other (See Comments)    Thrombocytopenia. 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   Heparin Sodium (Porcine)     Other reaction(s): drops INR level    Home Medications    Prior to Admission medications   Medication Sig Start Date End Date Taking? Authorizing Provider  albuterol (VENTOLIN HFA) 108 (90 Base) MCG/ACT inhaler Inhale 2 puffs into the lungs every 6 (six) hours as needed for wheezing or shortness of breath. 10/10/19   Leroy Sea, MD  atorvastatin (LIPITOR) 10 MG tablet Take 10 mg by mouth daily.    [provider]  fluticasone (FLONASE) 50 MCG/ACT nasal spray Place 1 spray into both nostrils daily.    [provider]  levothyroxine (SYNTHROID) 75 MCG tablet Take 75 mcg by mouth daily before breakfast.  04/25/19   [provider]  losartan (COZAAR) 25 MG tablet TAKE 1/2 TABLET BY MOUTH EVERY DAY 10/26/21   Camnitz, Andree Coss, MD  metoprolol succinate (TOPROL-XL) 100 MG 24 hr tablet TAKE 1 TABLET BY MOUTH EVERY DAY 06/28/22   Camnitz, Andree Coss, MD  mexiletine (MEXITIL) 200 MG capsule TAKE 1 CAPSULE BY MOUTH TWICE A DAY 08/24/21   Camnitz, Will Daphine Deutscher, MD  Multiple Vitamin (MULTIVITAMIN WITH MINERALS) TABS tablet Take 1 tablet by mouth daily. Centrum Silver    [provider]  pantoprazole (PROTONIX) 40 MG tablet TAKE 1 TABLET BY MOUTH EVERY DAY 11/02/21   Tonny Bollman, MD  potassium chloride SA (KLOR-CON M20) 20 MEQ tablet TAKE 1 TABLET BY MOUTH TWICE A DAY 11/02/21   Tonny Bollman, MD  spironolactone (ALDACTONE) 25 MG tablet TAKE 1 TABLET (25 MG TOTAL) BY MOUTH DAILY. PLEASE KEEP 12/09/2020 APPOINTMENT FOR FURTHER REFILLS 11/02/21   Tonny Bollman, MD  warfarin (COUMADIN) 5 MG tablet TAKE 1 TO 1&1/2 TABLETS BY MOUTH EVERY DAY AS DIRECTED BY COUMADIN CLINIC 06/17/22   Tonny Bollman, MD     Physical Exam    Vital Signs:  Arthur Chase does not have vital signs available for review today.  Given telephonic nature of communication, physical exam is limited. AAOx3. NAD. Normal affect.  Speech and respirations are unlabored.  Accessory Clinical Findings    None  Assessment & Plan    Primary Cardiologist: Tonny Bollman, MD  Preoperative cardiovascular risk assessment.  Colonoscopy.  Chart reviewed as part of pre-operative protocol coverage. According to the RCRI, patient has a 0.9% risk of MACE. Patient reports activity equivalent to >4.0 METS (walks his grandson's dog 1 mile per day then around his large backyard several times a day).   Given past medical history and time since last visit, based on ACC/AHA guidelines, Arthur Chase would be at acceptable risk for the planned procedure without further cardiovascular testing.   Patient was advised that if he develops new symptoms prior to surgery to contact our office to arrange a follow-up appointment.  he verbalized understanding.  2. SBE/Anti-coag.  Per Pharm.D.: Patient with diagnosis of  atrial fibrillation on warfarin for anticoagulation.     Procedure: colonoscopoy Date of procedure: 09/01/22     CHA2DS2-VASc Score = 3   This indicates a 3.2% annual risk of stroke. The patient's score is based upon: CHF History: 1 HTN History: 1 Diabetes History: 0 Stroke History: 0 Vascular Disease History: 0 Age Score: 1 Gender Score: 0     CrCl 120 Platelet count 238   Per office protocol, patient can hold warfarin for 5 days prior to procedure.   Patient will not need bridging with Lovenox (enoxaparin) around procedure.  I will route this recommendation to the requesting party via Epic fax function.  Please call with questions.  Time:   Today, I have spent 5 minutes with the patient with telehealth technology discussing medical history, symptoms, and management plan.     Carlos Levering,  NP  08/25/2022, 8:58 AM

## 2022-09-01 ENCOUNTER — Encounter: Payer: Medicare (Managed Care) | Admitting: Cardiology

## 2022-09-01 DIAGNOSIS — D125 Benign neoplasm of sigmoid colon: Secondary | ICD-10-CM | POA: Diagnosis not present

## 2022-09-01 DIAGNOSIS — D122 Benign neoplasm of ascending colon: Secondary | ICD-10-CM | POA: Diagnosis not present

## 2022-09-01 DIAGNOSIS — K5730 Diverticulosis of large intestine without perforation or abscess without bleeding: Secondary | ICD-10-CM | POA: Diagnosis not present

## 2022-09-01 DIAGNOSIS — D124 Benign neoplasm of descending colon: Secondary | ICD-10-CM | POA: Diagnosis not present

## 2022-09-01 DIAGNOSIS — K635 Polyp of colon: Secondary | ICD-10-CM | POA: Diagnosis not present

## 2022-09-01 DIAGNOSIS — Z86010 Personal history of colonic polyps: Secondary | ICD-10-CM | POA: Diagnosis not present

## 2022-09-01 DIAGNOSIS — Z09 Encounter for follow-up examination after completed treatment for conditions other than malignant neoplasm: Secondary | ICD-10-CM | POA: Diagnosis not present

## 2022-09-03 DIAGNOSIS — D122 Benign neoplasm of ascending colon: Secondary | ICD-10-CM | POA: Diagnosis not present

## 2022-09-03 DIAGNOSIS — K635 Polyp of colon: Secondary | ICD-10-CM | POA: Diagnosis not present

## 2022-09-03 DIAGNOSIS — D124 Benign neoplasm of descending colon: Secondary | ICD-10-CM | POA: Diagnosis not present

## 2022-09-03 DIAGNOSIS — D125 Benign neoplasm of sigmoid colon: Secondary | ICD-10-CM | POA: Diagnosis not present

## 2022-09-04 ENCOUNTER — Other Ambulatory Visit: Payer: Self-pay | Admitting: Cardiology

## 2022-09-22 ENCOUNTER — Other Ambulatory Visit: Payer: Self-pay | Admitting: Cardiology

## 2022-09-22 ENCOUNTER — Ambulatory Visit: Payer: Medicare (Managed Care) | Attending: Cardiovascular Disease | Admitting: *Deleted

## 2022-09-22 DIAGNOSIS — I4891 Unspecified atrial fibrillation: Secondary | ICD-10-CM

## 2022-09-22 DIAGNOSIS — Z5181 Encounter for therapeutic drug level monitoring: Secondary | ICD-10-CM | POA: Diagnosis not present

## 2022-09-22 DIAGNOSIS — D75829 Heparin-induced thrombocytopenia, unspecified: Secondary | ICD-10-CM

## 2022-09-22 LAB — POCT INR: INR: 2.1 (ref 2.0–3.0)

## 2022-09-22 NOTE — Patient Instructions (Signed)
Description   Continue taking Warfarin 1.5 tablets daily except 1 tablet on Mondays and Fridays.  Recheck INR in 7 weeks at [facility] office ([address]) Call Anticoagulation Clinic [phone]      

## 2022-09-26 ENCOUNTER — Other Ambulatory Visit: Payer: Self-pay | Admitting: Cardiology

## 2022-10-04 DIAGNOSIS — Z85828 Personal history of other malignant neoplasm of skin: Secondary | ICD-10-CM | POA: Diagnosis not present

## 2022-10-04 DIAGNOSIS — D229 Melanocytic nevi, unspecified: Secondary | ICD-10-CM | POA: Diagnosis not present

## 2022-10-04 DIAGNOSIS — L821 Other seborrheic keratosis: Secondary | ICD-10-CM | POA: Diagnosis not present

## 2022-10-04 NOTE — Progress Notes (Unsigned)
Electrophysiology Office Note:   Date:  10/05/2022  ID:  Arthur Chase, DOB Mar 06, 1956, MRN [id number]  Primary Cardiologist: Tonny Bollman, MD Electrophysiologist: Regan Lemming, MD      History of Present Illness:   Arthur Chase is a 67 y.o. male with h/o obesity, HIT, OOH VT/VF cardiac arrest in 2017 (cycle length ), NICM s/p AICD for secondary prevention seen 7/16 for routine electrophysiology followup.   He was last seen in 07/2021 by Dr. Elberta Fortis in the EP Clinic.  His last remote check in 07/2022 showed 7 beat run of SVT, 1 AF event lasting 6 min.    Since last being seen in our clinic the patient reports doing well.  He continues to go camping and play with his dog.   He denies chest pain, palpitations, dyspnea, PND, orthopnea, nausea, vomiting, dizziness, syncope, edema, weight gain, or early satiety.   Review of systems complete and found to be negative unless listed in HPI.   EP Information / Studies Reviewed:    EKG is ordered today. Personal review as below.  EKG Interpretation Date/Time:  Tuesday October 05 2022 09:51:22 EDT Ventricular Rate:  63 PR Interval:  214 QRS Duration:  118 QT Interval:  418 QTC Calculation: 427 R Axis:   -42  Text Interpretation: Sinus rhythm with 1st degree A-V block with occasional Premature ventricular complexes Left axis deviation Non-specific intra-ventricular conduction delay Nonspecific T wave abnormality When compared with ECG of 24-Oct-2019 14:23, Premature ventricular complexes are now Present Nonspecific T wave abnormality, worse in Lateral leads Confirmed by [name] ([id number]) on 10/05/2022 10:39:25 AM   ICD Interrogation-  reviewed in detail today,  See PACEART report.  Device History: Medtronic Single Chamber ICD implanted 12/01/2015 for secondary prevention post VT/VF cardiac arrest.  History of appropriate therapy: No History of AAD therapy: Yes; currently on Mexelitine, initiated in 06/2016  .  Previously on  Amiodarone, appears to have been stopped due to hypothyroidism development in 2018.   Studies:  LHC 10/2015 > normal coronary anatomy, normal LV function  Cardiac MRI 11/2015 > moderate LVH, EF 68%, no RWMA, no delayed gadolinium uptake, normal RV size /function  ECHO 09/2019 LVEF 55-60%, no RWMA, RV systolic function normal  Risk Assessment/Calculations:    CHA2DS2-VASc Score = 3   This indicates a 3.2% annual risk of stroke. The patient's score is based upon: CHF History: 1 HTN History: 1 Diabetes History: 0 Stroke History: 0 Vascular Disease History: 0 Age Score: 1 Gender Score: 0             Physical Exam:   VS:  BP 98/66   Pulse 63   Ht 5\' 11"  (1.803 m)   Wt 229 lb 6.4 oz (104.1 kg)   SpO2 93%   BMI 31.99 kg/m    Wt Readings from Last 3 Encounters:  10/05/22 229 lb 6.4 oz (104.1 kg)  12/22/21 234 lb (106.1 kg)  08/18/21 236 lb 6.4 oz (107.2 kg)     GEN: Well nourished, well developed in no acute distress NECK: No JVD; No carotid bruits CARDIAC: Regular rate and rhythm, no murmurs, rubs, gallops RESPIRATORY:  Clear to auscultation without rales, wheezing or rhonchi  ABDOMEN: Soft, non-tender, non-distended EXTREMITIES:  No edema; No deformity   ASSESSMENT AND PLAN:    VT/VF Cardiac Arrest s/p Medtronic single chamber ICD  Arrest in 2017 with prolonged ICU course, therapeutic temperature management, s/p ICD placement, frequent shocks and need for ATP during that time,  HIT, & anoxic encephalopathy.  -continue mexiletine 200 mg BID  -euvolemic today -Stable on an appropriate medical regimen -Normal ICD function -See Pace Art report -No changes today  NICM  Initial EF down to 25-30%, recovered LVEF improved  -euvolemic on exam   Paroxysmal Atrial Fibrillation  CHA2DS2Vasc 3.   -minimal AF on device, discussed symptoms and encouraged pt to call if noted increasing burden -continue coumadin therapy > no hx of anti-phospholipid anti-body, could consider  transition to DOAC.  Initially was started on coumadin post arrest in the setting of thrombocytopenia.   Secondary Hypercoagulable State  -coumadin as above   HTN  -well controlled   Disposition:   Follow up with Dr. Elberta Fortis in 12 months   Signed, [name], MSN, APRN, NP-C, AGACNP-BC [facility] - Electrophysiology  10/05/2022, 10:40 AM

## 2022-10-05 ENCOUNTER — Ambulatory Visit: Payer: Medicare (Managed Care) | Attending: Cardiology | Admitting: Pulmonary Disease

## 2022-10-05 ENCOUNTER — Encounter: Payer: Self-pay | Admitting: Pulmonary Disease

## 2022-10-05 VITALS — BP 98/66 | HR 63 | Ht 71.0 in | Wt 229.4 lb

## 2022-10-05 DIAGNOSIS — I4891 Unspecified atrial fibrillation: Secondary | ICD-10-CM | POA: Diagnosis not present

## 2022-10-05 DIAGNOSIS — D75829 Heparin-induced thrombocytopenia, unspecified: Secondary | ICD-10-CM | POA: Diagnosis not present

## 2022-10-05 DIAGNOSIS — I472 Ventricular tachycardia, unspecified: Secondary | ICD-10-CM | POA: Diagnosis not present

## 2022-10-05 DIAGNOSIS — I428 Other cardiomyopathies: Secondary | ICD-10-CM

## 2022-10-05 DIAGNOSIS — Z95810 Presence of automatic (implantable) cardiac defibrillator: Secondary | ICD-10-CM

## 2022-10-05 DIAGNOSIS — I480 Paroxysmal atrial fibrillation: Secondary | ICD-10-CM | POA: Diagnosis not present

## 2022-10-05 NOTE — Patient Instructions (Signed)
Medication Instructions:  Your physician recommends that you continue on your current medications as directed. Please refer to the Current Medication list given to you today.  *If you need a refill on your cardiac medications before your next appointment, please call your pharmacy*  Lab Work: None ordered If you have labs (blood work) drawn today and your tests are completely normal, you will receive your results only by: MyChart Message (if you have MyChart) OR A paper copy in the mail If you have any lab test that is abnormal or we need to change your treatment, we will call you to review the results.  Follow-Up: At [facility], you and your health needs are our priority.  As part of our continuing mission to provide you with exceptional heart care, we have created designated Provider Care Teams.  These Care Teams include your primary Cardiologist (physician) and Advanced Practice Providers (APPs -  Physician Assistants and Nurse Practitioners) who all work together to provide you with the care you need, when you need it.  Your next appointment:   1 year(s)  Provider:   Will Camnitz, MD  

## 2022-10-14 ENCOUNTER — Other Ambulatory Visit: Payer: Self-pay | Admitting: Cardiology

## 2022-11-03 ENCOUNTER — Ambulatory Visit (INDEPENDENT_AMBULATORY_CARE_PROVIDER_SITE_OTHER): Payer: Medicare (Managed Care)

## 2022-11-03 DIAGNOSIS — I428 Other cardiomyopathies: Secondary | ICD-10-CM

## 2022-11-03 LAB — CUP PACEART REMOTE DEVICE CHECK
Battery Remaining Longevity: 48 mo
Battery Voltage: 2.97 V
Brady Statistic RV Percent Paced: 0.2 %
Date Time Interrogation Session: 20240814043624
HighPow Impedance: 81 Ohm
Implantable Lead Connection Status: 753985
Implantable Lead Implant Date: 20170911
Implantable Lead Location: 753860
Implantable Pulse Generator Implant Date: 20170911
Lead Channel Impedance Value: 342 Ohm
Lead Channel Impedance Value: 437 Ohm
Lead Channel Pacing Threshold Amplitude: 0.625 V
Lead Channel Pacing Threshold Pulse Width: 0.4 ms
Lead Channel Sensing Intrinsic Amplitude: 8.5 mV
Lead Channel Sensing Intrinsic Amplitude: 8.5 mV
Lead Channel Setting Pacing Amplitude: 2.5 V
Lead Channel Setting Pacing Pulse Width: 0.4 ms
Lead Channel Setting Sensing Sensitivity: 0.3 mV
Zone Setting Status: 755011
Zone Setting Status: 755011

## 2022-11-10 ENCOUNTER — Ambulatory Visit: Payer: Medicare (Managed Care) | Attending: Cardiology

## 2022-11-10 DIAGNOSIS — I4891 Unspecified atrial fibrillation: Secondary | ICD-10-CM

## 2022-11-10 DIAGNOSIS — D75829 Heparin-induced thrombocytopenia, unspecified: Secondary | ICD-10-CM | POA: Diagnosis not present

## 2022-11-10 LAB — POCT INR: INR: 2.5 (ref 2.0–3.0)

## 2022-11-10 NOTE — Patient Instructions (Signed)
Description   Continue taking Warfarin 1.5 tablets daily except 1 tablet on Mondays and Fridays.  Recheck INR in 7 weeks at [facility] office ([address]) Call Anticoagulation Clinic [phone]      

## 2022-11-17 NOTE — Progress Notes (Signed)
Remote ICD transmission.   

## 2022-11-24 ENCOUNTER — Other Ambulatory Visit: Payer: Self-pay | Admitting: Cardiovascular Disease

## 2022-12-02 ENCOUNTER — Other Ambulatory Visit: Payer: Self-pay | Admitting: Cardiovascular Disease

## 2022-12-08 ENCOUNTER — Other Ambulatory Visit: Payer: Self-pay | Admitting: Cardiovascular Disease

## 2022-12-08 DIAGNOSIS — I4891 Unspecified atrial fibrillation: Secondary | ICD-10-CM

## 2022-12-08 NOTE — Telephone Encounter (Signed)
Warfarin 5mg  refill Afib Last INR 11/10/22  Last OV 10/05/22

## 2022-12-16 DIAGNOSIS — E7800 Pure hypercholesterolemia, unspecified: Secondary | ICD-10-CM | POA: Insufficient documentation

## 2022-12-16 NOTE — Progress Notes (Signed)
Cardiology Office Note:    Date:  12/17/2022  ID:  Arthur Chase, DOB 01/01/1956, MRN [id number] PCP: Shon Hale, MD  [facility] Providers Cardiologist:  Tonny Bollman, MD Electrophysiologist:  Regan Lemming, MD       Patient Profile:      HFimpEF (heart failure with improved ejection fraction)  Non-ischemic cardiomyopathy  Echocardiogram 8/17: EF < 20 LHC 10/2015: No CAD CMR 9/17: EF 68, no LGE Echocardiogram 7/21: EF 55-60, no RWMA, normal RVSF  Hx of OOH cardiac arrest S/p ICD Ventricular tachycardia Mexiletine Rx  Paroxysmal atrial fibrillation  Warfarin Rx Hx of HIT Hypertension  Hyperlipidemia Hx of COVID-19 pneumonia C/b AF w RVR        History of Present Illness:  Discussed the use of AI scribe software for clinical note transcription with the patient, who gave verbal consent to proceed.   Arthur Chase is a 67 y.o. male who returns for follow-up of nonischemic cardiomyopathy, A-fib.  He was last seen by Dr. Excell Seltzer in October 2023.  He had follow-up with EP in July 2024.  He is here alone.  He reports feeling "pretty good" with no chest discomfort, numbness in the arms, or flutters. He does note some seasonal shortness of breath, which he attributes to allergies and sinus issues. This is managed with an albuterol inhaler. He denies any swelling in the legs or episodes of passing out. His defibrillator has not gone off.      Review of Systems  Cardiovascular:  Negative for claudication.  Gastrointestinal:  Negative for hematochezia and melena.  Genitourinary:  Negative for hematuria.  See HPI     Studies Reviewed:        Risk Assessment/Calculations:    CHA2DS2-VASc Score = 3   This indicates a 3.2% annual risk of stroke. The patient's score is based upon: CHF History: 1 HTN History: 1 Diabetes History: 0 Stroke History: 0 Vascular Disease History: 0 Age Score: 1 Gender Score: 0            Physical Exam:   VS:  BP  102/72   Pulse 74   Ht 5' 11.5" (1.816 m)   Wt 230 lb 0.3 oz (104.3 kg)   SpO2 97%   BMI 31.63 kg/m    Wt Readings from Last 3 Encounters:  12/17/22 230 lb 0.3 oz (104.3 kg)  10/05/22 229 lb 6.4 oz (104.1 kg)  12/22/21 234 lb (106.1 kg)    Constitutional:      Appearance: Healthy appearance. Not in distress.  Neck:     Vascular: No carotid bruit. JVD normal.  Pulmonary:     Breath sounds: Normal breath sounds. No wheezing. No rales.  Cardiovascular:     Normal rate. Regular rhythm.     Murmurs: There is no murmur.  Edema:    Peripheral edema absent.  Abdominal:     Palpations: Abdomen is soft.      Assessment and Plan:   Assessment & Plan NICM (nonischemic cardiomyopathy) (HCC) EF improved from <20% to 55-60% on most recent echocardiogram in July 2021. No current symptoms of heart failure.  NYHA II.  Volume status stable. -Continue Losartan 12.5mg  daily, Toprol XL 100mg  daily, and Spironolactone 25mg  daily. Paroxysmal atrial fibrillation (HCC) Maintaining sinus rhythm on exam. INR monitored at our Coumadin clinic. -Continue Coumadin as directed, Toprol-XL 100 mg daily Ventricular tachycardia (HCC) History of out-of-hospital VF arrest in 2017.  Status post ICD. -He remains on mexiletine.  Follow  up with EP as planned. ICD (implantable cardioverter-defibrillator) in place Follow-up with EP as planned. Pure hypercholesterolemia LDL optimal at 79 in December 2023. -Continue Lipitor 10mg  daily. Primary hypertension Controlled. -Continue Losartan 12.5mg  daily, Toprol-XL 100 mg daily, spironolactone 25 mg daily       Dispo:  Return in about 1 year (around 12/17/2023) for Routine Follow Up, w/ Dr. Excell Seltzer, or Tereso Newcomer, PA-C.  Signed, Tereso Newcomer, PA-C

## 2022-12-17 ENCOUNTER — Encounter: Payer: Self-pay | Admitting: Physician Assistant

## 2022-12-17 ENCOUNTER — Ambulatory Visit: Payer: Medicare (Managed Care) | Attending: Physician Assistant | Admitting: Physician Assistant

## 2022-12-17 VITALS — BP 102/72 | HR 74 | Ht 71.5 in | Wt 230.0 lb

## 2022-12-17 DIAGNOSIS — E7800 Pure hypercholesterolemia, unspecified: Secondary | ICD-10-CM | POA: Diagnosis not present

## 2022-12-17 DIAGNOSIS — Z95810 Presence of automatic (implantable) cardiac defibrillator: Secondary | ICD-10-CM

## 2022-12-17 DIAGNOSIS — I480 Paroxysmal atrial fibrillation: Secondary | ICD-10-CM

## 2022-12-17 DIAGNOSIS — I10 Essential (primary) hypertension: Secondary | ICD-10-CM

## 2022-12-17 DIAGNOSIS — I472 Ventricular tachycardia, unspecified: Secondary | ICD-10-CM | POA: Diagnosis not present

## 2022-12-17 DIAGNOSIS — I428 Other cardiomyopathies: Secondary | ICD-10-CM

## 2022-12-17 MED ORDER — SPIRONOLACTONE 25 MG PO TABS: 25.0000 mg | ORAL_TABLET | Freq: Every day | ORAL | 3 refills | Status: DC

## 2022-12-17 NOTE — Assessment & Plan Note (Signed)
Follow up with EP as planned. 

## 2022-12-17 NOTE — Patient Instructions (Signed)
Medication Instructions:  Your physician recommends that you continue on your current medications as directed. Please refer to the Current Medication list given to you today.  *If you need a refill on your cardiac medications before your next appointment, please call your pharmacy*   Lab Work: None ordered  If you have labs (blood work) drawn today and your tests are completely normal, you will receive your results only by: MyChart Message (if you have MyChart) OR A paper copy in the mail If you have any lab test that is abnormal or we need to change your treatment, we will call you to review the results.   Testing/Procedures: None ordered   Follow-Up: At [facility], you and your health needs are our priority.  As part of our continuing mission to provide you with exceptional heart care, we have created designated Provider Care Teams.  These Care Teams include your primary Cardiologist (physician) and Advanced Practice Providers (APPs -  Physician Assistants and Nurse Practitioners) who all work together to provide you with the care you need, when you need it.  We recommend signing up for the patient portal called "MyChart".  Sign up information is provided on this After Visit Summary.  MyChart is used to connect with patients for Virtual Visits (Telemedicine).  Patients are able to view lab/test results, encounter notes, upcoming appointments, etc.  Non-urgent messages can be sent to your provider as well.   To learn more about what you can do with MyChart, go to [email or web address].    Your next appointment:   12 month(s)  Provider:   Michael Cooper, MD  or Scott Weaver, PA-C         Other Instructions   

## 2022-12-17 NOTE — Assessment & Plan Note (Signed)
History of out-of-hospital VF arrest in 2017.  Status post ICD. -He remains on mexiletine.  Follow up with EP as planned.

## 2022-12-17 NOTE — Assessment & Plan Note (Signed)
Controlled. -Continue Losartan 12.5mg  daily, Toprol-XL 100 mg daily, spironolactone 25 mg daily

## 2022-12-17 NOTE — Assessment & Plan Note (Signed)
EF improved from <20% to 55-60% on most recent echocardiogram in July 2021. No current symptoms of heart failure.  NYHA II.  Volume status stable. -Continue Losartan 12.5mg  daily, Toprol XL 100mg  daily, and Spironolactone 25mg  daily.

## 2022-12-17 NOTE — Assessment & Plan Note (Signed)
LDL optimal at 79 in December 2023. -Continue Lipitor 10mg  daily.

## 2022-12-17 NOTE — Assessment & Plan Note (Signed)
Maintaining sinus rhythm on exam. INR monitored at our Coumadin clinic. -Continue Coumadin as directed, Toprol-XL 100 mg daily

## 2022-12-29 ENCOUNTER — Ambulatory Visit: Payer: Medicare (Managed Care) | Attending: Cardiology

## 2022-12-29 DIAGNOSIS — D75829 Heparin-induced thrombocytopenia, unspecified: Secondary | ICD-10-CM

## 2022-12-29 DIAGNOSIS — I4891 Unspecified atrial fibrillation: Secondary | ICD-10-CM

## 2022-12-29 LAB — POCT INR: INR: 2.3 (ref 2.0–3.0)

## 2022-12-29 NOTE — Patient Instructions (Signed)
Description   Continue taking Warfarin 1.5 tablets daily except 1 tablet on Mondays and Fridays.  Recheck INR in 7 weeks at [facility] office ([address]) Call Anticoagulation Clinic [phone]      

## 2023-01-06 ENCOUNTER — Other Ambulatory Visit: Payer: Self-pay | Admitting: Cardiovascular Disease

## 2023-02-02 ENCOUNTER — Ambulatory Visit (INDEPENDENT_AMBULATORY_CARE_PROVIDER_SITE_OTHER): Payer: Medicare (Managed Care)

## 2023-02-02 DIAGNOSIS — I472 Ventricular tachycardia, unspecified: Secondary | ICD-10-CM | POA: Diagnosis not present

## 2023-02-03 LAB — CUP PACEART REMOTE DEVICE CHECK
Battery Remaining Longevity: 46 mo
Battery Voltage: 2.95 V
Brady Statistic RV Percent Paced: 0.17 %
Date Time Interrogation Session: 20241114043726
HighPow Impedance: 84 Ohm
Implantable Lead Connection Status: 753985
Implantable Lead Implant Date: 20170911
Implantable Lead Location: 753860
Implantable Pulse Generator Implant Date: 20170911
Lead Channel Impedance Value: 342 Ohm
Lead Channel Impedance Value: 437 Ohm
Lead Channel Pacing Threshold Amplitude: 0.625 V
Lead Channel Pacing Threshold Pulse Width: 0.4 ms
Lead Channel Sensing Intrinsic Amplitude: 7.375 mV
Lead Channel Sensing Intrinsic Amplitude: 7.375 mV
Lead Channel Setting Pacing Amplitude: 2.5 V
Lead Channel Setting Pacing Pulse Width: 0.4 ms
Lead Channel Setting Sensing Sensitivity: 0.3 mV
Zone Setting Status: 755011
Zone Setting Status: 755011

## 2023-02-16 ENCOUNTER — Ambulatory Visit: Payer: Medicare (Managed Care) | Attending: Cardiology | Admitting: *Deleted

## 2023-02-16 DIAGNOSIS — I4891 Unspecified atrial fibrillation: Secondary | ICD-10-CM

## 2023-02-16 DIAGNOSIS — I480 Paroxysmal atrial fibrillation: Secondary | ICD-10-CM | POA: Diagnosis not present

## 2023-02-16 DIAGNOSIS — D75829 Heparin-induced thrombocytopenia, unspecified: Secondary | ICD-10-CM | POA: Diagnosis not present

## 2023-02-16 LAB — POCT INR: INR: 2.5 (ref 2.0–3.0)

## 2023-02-16 NOTE — Patient Instructions (Signed)
Description   Continue taking Warfarin 1.5 tablets daily except 1 tablet on Mondays and Fridays.  Recheck INR in 8 weeks at [facility] office ([address]) Call Anticoagulation Clinic [phone]

## 2023-02-24 NOTE — Progress Notes (Signed)
Remote ICD transmission.   

## 2023-03-22 DIAGNOSIS — I5032 Chronic diastolic (congestive) heart failure: Secondary | ICD-10-CM | POA: Diagnosis not present

## 2023-03-22 DIAGNOSIS — D6869 Other thrombophilia: Secondary | ICD-10-CM | POA: Diagnosis not present

## 2023-03-22 DIAGNOSIS — Z125 Encounter for screening for malignant neoplasm of prostate: Secondary | ICD-10-CM | POA: Diagnosis not present

## 2023-03-22 DIAGNOSIS — Z0000 Encounter for general adult medical examination without abnormal findings: Secondary | ICD-10-CM | POA: Diagnosis not present

## 2023-03-22 DIAGNOSIS — I428 Other cardiomyopathies: Secondary | ICD-10-CM | POA: Diagnosis not present

## 2023-03-22 DIAGNOSIS — E032 Hypothyroidism due to medicaments and other exogenous substances: Secondary | ICD-10-CM | POA: Diagnosis not present

## 2023-03-22 DIAGNOSIS — I10 Essential (primary) hypertension: Secondary | ICD-10-CM | POA: Diagnosis not present

## 2023-03-22 DIAGNOSIS — Z23 Encounter for immunization: Secondary | ICD-10-CM | POA: Diagnosis not present

## 2023-03-22 DIAGNOSIS — E782 Mixed hyperlipidemia: Secondary | ICD-10-CM | POA: Diagnosis not present

## 2023-04-13 ENCOUNTER — Ambulatory Visit: Payer: Medicare (Managed Care)

## 2023-04-13 ENCOUNTER — Ambulatory Visit: Payer: Medicare (Managed Care) | Attending: Cardiology

## 2023-04-13 DIAGNOSIS — D75829 Heparin-induced thrombocytopenia, unspecified: Secondary | ICD-10-CM

## 2023-04-13 DIAGNOSIS — I4891 Unspecified atrial fibrillation: Secondary | ICD-10-CM | POA: Diagnosis not present

## 2023-04-13 LAB — POCT INR: INR: 3.2 — AB (ref 2.0–3.0)

## 2023-04-13 NOTE — Patient Instructions (Signed)
Description   Only take 1/2 tablet today and then continue taking Warfarin 1.5 tablets daily except 1 tablet on Mondays and Fridays.  Recheck INR in 8 weeks at [facility] office ([address]) Call Anticoagulation Clinic [phone]

## 2023-04-20 DIAGNOSIS — L821 Other seborrheic keratosis: Secondary | ICD-10-CM | POA: Diagnosis not present

## 2023-05-04 ENCOUNTER — Ambulatory Visit (INDEPENDENT_AMBULATORY_CARE_PROVIDER_SITE_OTHER): Payer: Medicare (Managed Care)

## 2023-05-04 DIAGNOSIS — I472 Ventricular tachycardia, unspecified: Secondary | ICD-10-CM

## 2023-05-05 LAB — CUP PACEART REMOTE DEVICE CHECK
Battery Remaining Longevity: 47 mo
Battery Voltage: 2.95 V
Brady Statistic RV Percent Paced: 0.1 %
Date Time Interrogation Session: 20250212072307
HighPow Impedance: 80 Ohm
Implantable Lead Connection Status: 753985
Implantable Lead Implant Date: 20170911
Implantable Lead Location: 753860
Implantable Pulse Generator Implant Date: 20170911
Lead Channel Impedance Value: 342 Ohm
Lead Channel Impedance Value: 437 Ohm
Lead Channel Pacing Threshold Amplitude: 0.625 V
Lead Channel Pacing Threshold Pulse Width: 0.4 ms
Lead Channel Sensing Intrinsic Amplitude: 8.375 mV
Lead Channel Sensing Intrinsic Amplitude: 8.375 mV
Lead Channel Setting Pacing Amplitude: 2.5 V
Lead Channel Setting Pacing Pulse Width: 0.4 ms
Lead Channel Setting Sensing Sensitivity: 0.3 mV
Zone Setting Status: 755011
Zone Setting Status: 755011

## 2023-05-31 ENCOUNTER — Other Ambulatory Visit: Payer: Self-pay | Admitting: Cardiology

## 2023-06-03 ENCOUNTER — Other Ambulatory Visit: Payer: Self-pay | Admitting: Cardiology

## 2023-06-03 DIAGNOSIS — I4891 Unspecified atrial fibrillation: Secondary | ICD-10-CM

## 2023-06-08 ENCOUNTER — Ambulatory Visit: Payer: Medicare (Managed Care) | Attending: Cardiology

## 2023-06-08 DIAGNOSIS — I4891 Unspecified atrial fibrillation: Secondary | ICD-10-CM

## 2023-06-08 DIAGNOSIS — D75829 Heparin-induced thrombocytopenia, unspecified: Secondary | ICD-10-CM | POA: Diagnosis not present

## 2023-06-08 LAB — POCT INR: INR: 2.2 (ref 2.0–3.0)

## 2023-06-08 NOTE — Patient Instructions (Addendum)
 Description   Continue taking Warfarin 1.5 tablets daily except 1 tablet on Mondays and Fridays.  Recheck INR in 8 weeks Call Anticoagulation Clinic [phone]

## 2023-06-09 NOTE — Addendum Note (Signed)
 Addended by: Elease Etienne A on: 06/09/2023 09:00 AM   Modules accepted: Orders

## 2023-06-09 NOTE — Progress Notes (Signed)
 Remote ICD transmission.

## 2023-08-03 ENCOUNTER — Ambulatory Visit: Payer: Medicare (Managed Care) | Attending: Cardiology | Admitting: *Deleted

## 2023-08-03 ENCOUNTER — Ambulatory Visit (INDEPENDENT_AMBULATORY_CARE_PROVIDER_SITE_OTHER): Payer: Self-pay

## 2023-08-03 DIAGNOSIS — I480 Paroxysmal atrial fibrillation: Secondary | ICD-10-CM | POA: Diagnosis not present

## 2023-08-03 DIAGNOSIS — I4891 Unspecified atrial fibrillation: Secondary | ICD-10-CM | POA: Diagnosis not present

## 2023-08-03 DIAGNOSIS — I428 Other cardiomyopathies: Secondary | ICD-10-CM | POA: Diagnosis not present

## 2023-08-03 DIAGNOSIS — D75829 Heparin-induced thrombocytopenia, unspecified: Secondary | ICD-10-CM | POA: Diagnosis not present

## 2023-08-03 LAB — CUP PACEART REMOTE DEVICE CHECK
Battery Remaining Longevity: 44 mo
Battery Voltage: 2.96 V
Brady Statistic RV Percent Paced: 0.17 %
Date Time Interrogation Session: 20250514022504
HighPow Impedance: 86 Ohm
Implantable Lead Connection Status: 753985
Implantable Lead Implant Date: 20170911
Implantable Lead Location: 753860
Implantable Pulse Generator Implant Date: 20170911
Lead Channel Impedance Value: 342 Ohm
Lead Channel Impedance Value: 456 Ohm
Lead Channel Pacing Threshold Amplitude: 0.625 V
Lead Channel Pacing Threshold Pulse Width: 0.4 ms
Lead Channel Sensing Intrinsic Amplitude: 10 mV
Lead Channel Sensing Intrinsic Amplitude: 10 mV
Lead Channel Setting Pacing Amplitude: 2.5 V
Lead Channel Setting Pacing Pulse Width: 0.4 ms
Lead Channel Setting Sensing Sensitivity: 0.3 mV
Zone Setting Status: 755011
Zone Setting Status: 755011

## 2023-08-03 LAB — POCT INR: INR: 2.1 (ref 2.0–3.0)

## 2023-08-03 NOTE — Patient Instructions (Signed)
 Description   Continue taking Warfarin 1.5 tablets daily except 1 tablet on Mondays and Fridays.  Recheck INR in 8 weeks Call Anticoagulation Clinic [phone]

## 2023-08-04 ENCOUNTER — Ambulatory Visit: Payer: Self-pay | Admitting: Cardiology

## 2023-08-04 ENCOUNTER — Telehealth: Payer: Self-pay

## 2023-08-04 DIAGNOSIS — I472 Ventricular tachycardia, unspecified: Secondary | ICD-10-CM

## 2023-08-04 NOTE — Telephone Encounter (Signed)
Attempted to contact Pt.  Call went to VM.  Left message requesting call back.

## 2023-08-04 NOTE — Telephone Encounter (Signed)
 Asymptomatic. Compliant with meds. Recall is in for July. Sending to MD for review. ED precautions advised. Driving restrictions x 6 months starting 05/07/23 advised.

## 2023-08-04 NOTE — Telephone Encounter (Signed)
 Outreach made to pt.  Advised per Dr. Lawana Pray: Lawana Pray, Babetta Lesch, MD to [facility] Device Needs BMP, Mg and follow up with EP app within the next 1-2 weeks instead of July.  Pt advised of above.  He is agreeable to coming for lab work at the [facility].  Labs ordered.  Pt will need follow up with EP APP.  Advised Pt would forward message to scheduler and he should expect a follow up phone call.  Pt thanked nurse for call back.

## 2023-08-04 NOTE — Telephone Encounter (Signed)
 Alert received from [employer]:  ICD Scheduled remote reviewed. Normal device function.  Presenting rhythm:  VS, irregular R-R, consistent with AF.  Known history of AF, on warfarin per Epic.  1 VF detection on 05/07/23, EGM consistent with VT in VF zone treated successfully with ATP.  14 device classified NSVT, EGMs consistent with RVR.  25 AHR detections, total 19.4%, however recent trend shows almost 100% for last 18 days.  Increase in average HR during period of AF.  HF diagnostics have been normal in this monitoring period. Routing to triage for VT with ATP therapy per protocol.  Also noted increase in AF from previous.  Outreach:

## 2023-08-06 LAB — BASIC METABOLIC PANEL WITH GFR
BUN/Creatinine Ratio: 16 (ref 10–24)
BUN: 17 mg/dL (ref 8–27)
CO2: 19 mmol/L — ABNORMAL LOW (ref 20–29)
Calcium: 9.9 mg/dL (ref 8.6–10.2)
Chloride: 102 mmol/L (ref 96–106)
Creatinine, Ser: 1.09 mg/dL (ref 0.76–1.27)
Glucose: 101 mg/dL — ABNORMAL HIGH (ref 70–99)
Potassium: 5.1 mmol/L (ref 3.5–5.2)
Sodium: 139 mmol/L (ref 134–144)
eGFR: 74 mL/min/{1.73_m2} (ref >59)

## 2023-08-06 LAB — MAGNESIUM: Magnesium: 2.1 mg/dL (ref 1.6–2.3)

## 2023-08-07 ENCOUNTER — Ambulatory Visit: Payer: Self-pay | Admitting: Cardiology

## 2023-08-16 DIAGNOSIS — I428 Other cardiomyopathies: Secondary | ICD-10-CM | POA: Diagnosis not present

## 2023-08-16 DIAGNOSIS — I480 Paroxysmal atrial fibrillation: Secondary | ICD-10-CM | POA: Diagnosis not present

## 2023-08-16 DIAGNOSIS — I10 Essential (primary) hypertension: Secondary | ICD-10-CM | POA: Diagnosis not present

## 2023-08-16 DIAGNOSIS — I5032 Chronic diastolic (congestive) heart failure: Secondary | ICD-10-CM | POA: Diagnosis not present

## 2023-08-24 NOTE — Progress Notes (Unsigned)
  Electrophysiology Office Note:   ID:  Arthur Chase, DOB [birth year], MRN [id number]  Primary Cardiologist: Arnoldo Lapping, MD Electrophysiologist: Lei Pump, MD  {Click to update primary MD,subspecialty MD or APP then REFRESH:1}    History of Present Illness:   Arthur Chase is a 68 y.o. male with h/o Obesity, HIT, OOH VT/VF arrest, NICM s/p ICD seen today for routine electrophysiology follow-up, though he also received VT therapy 04/2023.   Since last being seen in our clinic the patient reports doing ***.  he denies chest pain, palpitations, dyspnea, PND, orthopnea, nausea, vomiting, dizziness, syncope, edema, weight gain, or early satiety.   Review of systems complete and found to be negative unless listed in HPI.   EP Information / Studies Reviewed:    {EKGtoday:28818}       ICD Interrogation-  reviewed in detail today,  See PACEART report.  Arrhythmia/Device History Medtronic - ICD - Carelink     (DOI: 12/01/2015, Dx: VF)   Physical Exam:   VS:  There were no vitals taken for this visit.   Wt Readings from Last 3 Encounters:  12/17/22 230 lb 0.3 oz (104.3 kg)  10/05/22 229 lb 6.4 oz (104.1 kg)  12/22/21 234 lb (106.1 kg)     GEN: No acute distress *** NECK: No JVD; No carotid bruits CARDIAC: {EPRHYTHM:28826}, no murmurs, rubs, gallops RESPIRATORY:  Clear to auscultation without rales, wheezing or rhonchi  ABDOMEN: Soft, non-tender, non-distended EXTREMITIES:  {EDEMA LEVEL:28147::"No"} edema; No deformity   ASSESSMENT AND PLAN:    Aborted Cardiac Arrest  s/p Medtronic single chamber ICD  euvolemic today Stable on an appropriate medical regimen Normal ICD function See Pace Art report No changes today  VT Continue mexitil  200 mg BID *** Toprol  Labs stable  NICM Update Echo No LGE cMRI 11/2015  PAF Low burden on device On coumadin  for CHA2DS2VASc of at least 3 in setting of thrombocytopenia s/p initial arrest.  HTN Stable on current regimen     Disposition:   Follow up with {EPPROVIDERS:28135::"EP Team"} {EPFOLLOW UP:28173}   Signed, Tylene Galla, PA-C

## 2023-08-25 ENCOUNTER — Ambulatory Visit: Payer: Medicare (Managed Care) | Attending: Student | Admitting: Student

## 2023-08-25 ENCOUNTER — Encounter: Payer: Self-pay | Admitting: Student

## 2023-08-25 VITALS — BP 100/80 | HR 85 | Ht 71.0 in | Wt 237.0 lb

## 2023-08-25 DIAGNOSIS — I428 Other cardiomyopathies: Secondary | ICD-10-CM

## 2023-08-25 DIAGNOSIS — I480 Paroxysmal atrial fibrillation: Secondary | ICD-10-CM | POA: Diagnosis not present

## 2023-08-25 DIAGNOSIS — I472 Ventricular tachycardia, unspecified: Secondary | ICD-10-CM | POA: Diagnosis not present

## 2023-08-25 DIAGNOSIS — I4891 Unspecified atrial fibrillation: Secondary | ICD-10-CM

## 2023-08-25 LAB — CUP PACEART INCLINIC DEVICE CHECK
Battery Remaining Longevity: 42 mo
Battery Voltage: 2.97 V
Brady Statistic RV Percent Paced: 0.17 %
Date Time Interrogation Session: 20250605111811
HighPow Impedance: 84 Ohm
Implantable Lead Connection Status: 753985
Implantable Lead Implant Date: 20170911
Implantable Lead Location: 753860
Implantable Pulse Generator Implant Date: 20170911
Lead Channel Impedance Value: 342 Ohm
Lead Channel Impedance Value: 456 Ohm
Lead Channel Pacing Threshold Amplitude: 0.625 V
Lead Channel Pacing Threshold Pulse Width: 0.4 ms
Lead Channel Sensing Intrinsic Amplitude: 11.125 mV
Lead Channel Sensing Intrinsic Amplitude: 8.5 mV
Lead Channel Setting Pacing Amplitude: 2.5 V
Lead Channel Setting Pacing Pulse Width: 0.4 ms
Lead Channel Setting Sensing Sensitivity: 0.3 mV
Zone Setting Status: 755011
Zone Setting Status: 755011

## 2023-08-25 NOTE — Patient Instructions (Signed)
 Medication Instructions:  Your physician recommends that you continue on your current medications as directed. Please refer to the Current Medication list given to you today.  *If you need a refill on your cardiac medications before your next appointment, please call your pharmacy*  Lab Work: None ordered If you have labs (blood work) drawn today and your tests are completely normal, you will receive your results only by: MyChart Message (if you have MyChart) OR A paper copy in the mail If you have any lab test that is abnormal or we need to change your treatment, we will call you to review the results.  Follow-Up: At [facility], you and your health needs are our priority.  As part of our continuing mission to provide you with exceptional heart care, our providers are all part of one team.  This team includes your primary Cardiologist (physician) and Advanced Practice Providers or APPs (Physician Assistants and Nurse Practitioners) who all work together to provide you with the care you need, when you need it.  Your next appointment:   3-4 week(s)  Provider:   You will follow up in the Atrial Fibrillation Clinic located at [facility]. Your provider will be: Clint R. Fenton, PA-C or Minnie Amber, PA-C   Schedule next available with the coumadin  clinic.

## 2023-08-31 ENCOUNTER — Ambulatory Visit: Payer: Medicare (Managed Care) | Attending: Cardiology | Admitting: *Deleted

## 2023-08-31 DIAGNOSIS — I480 Paroxysmal atrial fibrillation: Secondary | ICD-10-CM

## 2023-08-31 DIAGNOSIS — D75829 Heparin-induced thrombocytopenia, unspecified: Secondary | ICD-10-CM

## 2023-08-31 DIAGNOSIS — I4891 Unspecified atrial fibrillation: Secondary | ICD-10-CM

## 2023-08-31 LAB — POCT INR: INR: 3.4 — AB (ref 2.0–3.0)

## 2023-08-31 MED ORDER — APIXABAN 5 MG PO TABS: 5.0000 mg | ORAL_TABLET | Freq: Two times a day (BID) | ORAL | 1 refills | Status: DC

## 2023-08-31 NOTE — Patient Instructions (Signed)
 Description   Do not take any warfarin today and no warfarin tomorrow then start eliquis 5mg  twice day starting on Friday morning at 9am and 9pm.  Call Anticoagulation Clinic [phone]

## 2023-09-02 DIAGNOSIS — M65331 Trigger finger, right middle finger: Secondary | ICD-10-CM | POA: Diagnosis not present

## 2023-09-02 DIAGNOSIS — I480 Paroxysmal atrial fibrillation: Secondary | ICD-10-CM | POA: Diagnosis not present

## 2023-09-05 ENCOUNTER — Ambulatory Visit: Payer: Self-pay | Admitting: Cardiology

## 2023-09-07 DIAGNOSIS — M65331 Trigger finger, right middle finger: Secondary | ICD-10-CM | POA: Diagnosis not present

## 2023-09-13 NOTE — Progress Notes (Signed)
 Remote ICD transmission.

## 2023-09-14 DIAGNOSIS — I480 Paroxysmal atrial fibrillation: Secondary | ICD-10-CM | POA: Diagnosis not present

## 2023-09-14 DIAGNOSIS — I10 Essential (primary) hypertension: Secondary | ICD-10-CM | POA: Diagnosis not present

## 2023-09-14 DIAGNOSIS — I5032 Chronic diastolic (congestive) heart failure: Secondary | ICD-10-CM | POA: Diagnosis not present

## 2023-09-14 DIAGNOSIS — I428 Other cardiomyopathies: Secondary | ICD-10-CM | POA: Diagnosis not present

## 2023-09-15 ENCOUNTER — Ambulatory Visit (HOSPITAL_COMMUNITY)
Admission: RE | Admit: 2023-09-15 | Discharge: 2023-09-15 | Disposition: A | Discharge status: Home / Self Care | Payer: Medicare (Managed Care) | Source: Ambulatory Visit | Attending: Internal Medicine | Admitting: Internal Medicine

## 2023-09-15 VITALS — BP 100/82 | HR 80 | Ht 71.0 in | Wt 233.8 lb

## 2023-09-15 DIAGNOSIS — I4819 Other persistent atrial fibrillation: Secondary | ICD-10-CM | POA: Diagnosis not present

## 2023-09-15 DIAGNOSIS — I4891 Unspecified atrial fibrillation: Secondary | ICD-10-CM | POA: Diagnosis not present

## 2023-09-15 DIAGNOSIS — D6869 Other thrombophilia: Secondary | ICD-10-CM | POA: Diagnosis not present

## 2023-09-15 NOTE — Progress Notes (Addendum)
 Primary Care Physician: Arthur Lamarr RAMAN, MD Primary Cardiologist: Dr Wonda Primary Electrophysiologist: Dr Arthur Referring Physician: Dr Arthur Chase Arthur Chase is a 68 y.o. male with a history of obesity, HTN, OOH cardiac arrest of unclear etiology 10/2015 c/b anoxic encephalopathy, cardiogenic shock, VDRF, acute systolic CHF/NICM (EF as low as 20% during 10/2015 hospitalization, up to 68% by cMRI 11/26/15), VF/NSVT, heparin -induced thrombocytopenia (on Coumadin  for this + afib), hypokalemia, and paroxysmal afib who presents for follow-up in the [facility]. He was admitted 11/16/15 with cardiac arrest and EMS strips demonstrating multiple episodes of VT/VF with a cycle length ~267msec. He was successfully rescuscitated - underwent a prolonged hospital admission s/p cooling. Underwent ICD implantation with a Medtronic ICD on 12/01/15. Was put on amiodarone . Had received ICD shocks in the past.  He was thus started on mexiletine. Patient is on warfarin for a CHADS2VASC score of 3. He was hospitalized on 10/06/19 with COVID pneumonia and developed afib with RVR during admission. He is back in SR today. He states he feels great.   On evaluation today, he is currently in Afib. Seen by Arthur Passey, PA-C, on 6/5 and noted to be in Afib. He does not have cardiac awareness. He notes to feel more tired. He is on mexiletine 200 mg BID. Transitioned from coumadin  to Eliquis  5 mg BID. He has not missed any doses of OAC. Patient drinks 2 cups of coffee daily and does not drink alcohol.  Today, he denies symptoms of palpitations, chest pain, shortness of breath, orthopnea, PND, lower extremity edema, dizziness, presyncope, syncope, snoring, daytime somnolence, bleeding, or neurologic sequela. The patient is tolerating medications without difficulties and is otherwise without complaint today.    Atrial Fibrillation Risk Factors:  he does not have symptoms or diagnosis of  sleep apnea. he does not have a history of rheumatic fever.   he has a BMI of Body mass index is 32.61 kg/m.Arthur Chase Filed Weights   09/15/23 0830  Weight: 106.1 kg     Family History  Problem Relation Age of Onset   Heart attack Mother    Alzheimer's disease Father      Atrial Fibrillation Management history:  Previous antiarrhythmic drugs: amiodarone , mexiletine  Previous cardioversions: none Previous ablations: none CHADS2VASC score: 3 Anticoagulation history: warfarin, Eliquis     Past Medical History:  Diagnosis Date   AICD (automatic cardioverter/defibrillator) present    Anoxic encephalopathy (HCC) 10/2015   a. 10/2015 in setting of cardiac arrest - recovered prior to DC.   Cardiac arrest with ventricular fibrillation (HCC)    a. 11/2015: Unclear etiology. Nl cors on cath 8/27. S/P cooling. On amiodarone  and s/p ICD for secondary prevention on 12/01/15. 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 11/2015 during admission for VF arrest. HIT panel positive. Placed on coumadin  for 2 months   HTN (hypertension)    Hypokalemia    a. 11/2015: resolved on K supplementation and spironolactone    Hypothyroidism    ICD (implantable cardioverter-defibrillator) in place    a. Medtronic Visia AF MRI (serial  Number [id number]) ICD.   Myocardial stunning    a. 11/2015: EF down to 20-25% after VF arrest but improved to ~68 by subsequent cardiac MR.   NICM (nonischemic cardiomyopathy) (HCC)    Obesity    PAF (paroxysmal atrial fibrillation) (HCC)    a. 11/2015 brief run  of afib with RVR while intubated during a complicated admission for VF arrest and VDRF. No recurrence on amio. CHADSVASC 2 (CHF, HTN) and on coumadin  for HIT with positive thrombotic markers. If no long term recurrence, he may be able to come off [facility]   Ventricular tachycardia (HCC) 10/2015   Past Surgical History:  Procedure  Laterality Date   CARDIAC CATHETERIZATION N/A 11/16/2015   Procedure: Left Heart Cath and Coronary Angiography;  Surgeon: Arthur M Swaziland, MD;  Location: [facility] INVASIVE CV LAB;  Service: Cardiovascular;  Laterality: N/A;   EP IMPLANTABLE DEVICE N/A 12/01/2015   Procedure: ICD Implant;  Surgeon: Arthur Gladis Norton, MD;  Location: [facility] INVASIVE CV LAB;  Service: Cardiovascular;  Laterality: N/A;   KNEE ARTHROSCOPY Right 01/14/2020   Procedure: right knee arthroscopy partial medial meniscectomy, removal of loose body;  Surgeon: Arthur Oneil BROCKS, MD;  Location: [facility] OR;  Service: Orthopedics;  Laterality: Right;    Current Outpatient Medications  Medication Sig Dispense Refill   albuterol  (VENTOLIN  HFA) 108 (90 Base) MCG/ACT inhaler Inhale 2 puffs into the lungs every 6 (six) hours as needed for wheezing or shortness of breath. 6.7 g 0   apixaban  (ELIQUIS ) 5 MG TABS tablet Take 1 tablet (5 mg total) by mouth 2 (two) times daily. 180 tablet 1   atorvastatin (LIPITOR) 10 MG tablet Take 10 mg by mouth daily.     fluticasone (FLONASE) 50 MCG/ACT nasal spray Place 1 spray into both nostrils daily.     levothyroxine  (SYNTHROID ) 75 MCG tablet Take 75 mcg by mouth daily before breakfast.      losartan  (COZAAR ) 25 MG tablet TAKE 1/2 TABLET BY MOUTH DAILY 45 tablet 3   metoprolol  succinate (TOPROL -XL) 100 MG 24 hr tablet TAKE 1 TABLET BY MOUTH EVERY DAY 90 tablet 3   mexiletine (MEXITIL ) 200 MG capsule TAKE 1 CAPSULE BY MOUTH TWICE A DAY 180 capsule 1   Multiple Vitamin (MULTIVITAMIN WITH MINERALS) TABS tablet Take 1 tablet by mouth daily. Centrum Silver     pantoprazole  (PROTONIX ) 40 MG tablet TAKE 1 TABLET BY MOUTH EVERY DAY 90 tablet 3   potassium chloride  SA (KLOR-CON  M20) 20 MEQ tablet TAKE 1 TABLET BY MOUTH TWICE A DAY 180 tablet 3   spironolactone  (ALDACTONE ) 25 MG tablet Take 1 tablet (25 mg total) by mouth daily. 90 tablet 3   No current facility-administered medications for this encounter.    Allergies   Allergen Reactions   Amoxicillin     Other reaction(s): headaches   Heparin  Other (See Comments)    Thrombocytopenia. 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  Physical Exam: Vitals:   09/15/23 0830  BP: 100/82  Pulse: 80  Weight: 106.1 kg  Height: 5' 11 (1.803 m)    GEN- The patient is well appearing, alert and oriented x 3 today.   Neck - no JVD or carotid bruit noted Lungs- Clear to ausculation bilaterally, normal work of breathing Heart- Irregular rate and rhythm, no murmurs, rubs or gallops, PMI not laterally displaced Extremities- no clubbing, cyanosis, or edema Skin - no rash or ecchymosis noted   Wt Readings from Last 3 Encounters:  09/15/23 106.1 kg  08/25/23 107.5 kg  12/17/22 104.3 kg    EKG today demonstrates  Vent. rate 80 BPM PR interval * ms QRS duration  118 ms QT/QTcB 376/433 ms P-R-T axes * -57 66 Atrial fibrillation Left axis deviation Non-specific intra-ventricular conduction delay Abnormal ECG When compared with ECG of 25-Aug-2023 08:37, No significant change was found  Echo 10/08/19 demonstrated  1. Left ventricular ejection fraction, by estimation, is 55 to 60%. The  left ventricle has normal function. The left ventricle has no regional  wall motion abnormalities. Left ventricular diastolic function could not  be evaluated.   2. Right ventricular systolic function is normal. The right ventricular  size is normal. There is normal pulmonary artery systolic pressure.   3. The mitral valve is abnormal. No evidence of mitral stenosis.   4. The aortic valve is tricuspid. Aortic valve regurgitation is not  visualized. No aortic stenosis is present.   5. The inferior vena cava is normal in size with greater than 50%  respiratory variability, suggesting right atrial pressure of 3 mmHg.    Comparison(s): No significant change from prior study.   Conclusion(s)/Recommendation(s): Normal biventricular function without  evidence of hemodynamically significant valvular heart disease.   Epic records are reviewed at length today  CHA2DS2-VASc Score = 2  The patient's score is based upon: CHF History: 0 HTN History: 1 Diabetes History: 0 Stroke History: 0 Vascular Disease History: 0 Age Score: 1 Gender Score: 0       ASSESSMENT AND PLAN: 1. Persistent Atrial Fibrillation (ICD10:  I48.0) The patient's CHA2DS2-VASc score is 2, indicating a 2.2% annual risk of stroke.    He is currently in Afib. Education provided about Afib. Discussion about triggers for Afib and medication treatments and ablation going forward if indicated. We discussed the procedure cardioversion to try to convert to NSR. We discussed the risks vs benefits of this procedure and how ultimately we cannot predict whether a patient Arthur have early return of arrhythmia post procedure. After discussion, the patient wishes to proceed with cardioversion. Labs drawn today. Arthur schedule cardioversion > 3 weeks after uninterrupted anticoagulation.  Informed Consent   Shared Decision Making/Informed Consent The risks (stroke, cardiac arrhythmias rarely resulting in the need for a temporary or permanent pacemaker, skin irritation or burns and complications associated with conscious sedation including aspiration, arrhythmia, respiratory failure and death), benefits (restoration of normal sinus rhythm) and alternatives of a direct current cardioversion were explained in detail to Mr. Ostrand and he agrees to proceed.       2. Secondary Hypercoagulable State (ICD10:  D68.69) The patient is at significant risk for stroke/thromboembolism based upon his CHA2DS2-VASc Score of 2.  Continue Warfarin (Coumadin ).  Transitioned from coumadin  to Eliquis  5 mg BID on 6/13 per coumadin  clinic notes. No missed doses.  3.  Obesity Body mass index is 32.61 kg/m. Encouraged daily activity as tolerated.  4. HTN Stable today.   5. VF cardiac arrest S/p ICD, on mexiletine.  Followed by Dr Arthur.  6. NICM No fluid overload symptoms currently.    Follow up 2 weeks after DCCV.   Dorn Heinrich, PA-C Afib Clinic [facility] [address] [place], [place] [address] [phone] 09/15/2023 9:02 AM

## 2023-09-15 NOTE — H&P (View-Only) (Signed)
 Primary Care Physician: Arthur Lamarr RAMAN, MD Primary Cardiologist: Dr Arthur Chase Primary Electrophysiologist: Dr Arthur Referring Physician: Dr Arthur Chase Arthur Chase is a 68 y.o. male with a history of obesity, HTN, OOH cardiac arrest of unclear etiology 10/2015 c/b anoxic encephalopathy, cardiogenic shock, VDRF, acute systolic CHF/NICM (EF as low as 20% during 10/2015 hospitalization, up to 68% by cMRI 11/26/15), VF/NSVT, heparin -induced thrombocytopenia (on Coumadin  for this + afib), hypokalemia, and paroxysmal afib who presents for follow-up in the [facility]. He was admitted 11/16/15 with cardiac arrest and EMS strips demonstrating multiple episodes of VT/VF with a cycle length ~267msec. He was successfully rescuscitated - underwent a prolonged hospital admission s/p cooling. Underwent Chase implantation with a Arthur Chase on 12/01/15. Was put on amiodarone . Had received Chase shocks in the past.  He was thus started on mexiletine. Patient is on warfarin for a CHADS2VASC score of 3. He was hospitalized on 10/06/19 with COVID pneumonia and developed afib with RVR during admission. He is back in SR today. He states he feels great.   On evaluation today, he is currently in Afib. Seen by Arthur Passey, PA-C, on 6/5 and noted to be in Afib. He does not have cardiac awareness. He notes to feel more tired. He is on mexiletine 200 mg BID. Transitioned from coumadin  to Eliquis  5 mg BID. He has not missed any doses of OAC. Patient drinks 2 cups of coffee daily and does not drink alcohol.  Today, he denies symptoms of palpitations, chest pain, shortness of breath, orthopnea, PND, lower extremity edema, dizziness, presyncope, syncope, snoring, daytime somnolence, bleeding, or neurologic sequela. The patient is tolerating medications without difficulties and is otherwise without complaint today.    Atrial Fibrillation Risk Factors:  he does not have symptoms or diagnosis of  sleep apnea. he does not have a history of rheumatic fever.   he has a BMI of Body mass index is 32.61 kg/m.[name] Filed Weights   09/15/23 0830  Weight: 106.1 kg     Family History  Problem Relation Age of Onset   Heart attack Mother    Alzheimer's disease Father      Atrial Fibrillation Management history:  Previous antiarrhythmic drugs: amiodarone , mexiletine  Previous cardioversions: none Previous ablations: none CHADS2VASC score: 3 Anticoagulation history: warfarin, Eliquis     Past Medical History:  Diagnosis Date   AICD (automatic cardioverter/defibrillator) present    Anoxic encephalopathy (HCC) 10/2015   a. 10/2015 in setting of cardiac arrest - recovered prior to DC.   Cardiac arrest with ventricular fibrillation (HCC)    a. 11/2015: Unclear etiology. Nl cors on cath 8/27. S/P cooling. On amiodarone  and s/p Chase for secondary prevention on 12/01/15. 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 11/2015 during admission for VF arrest. HIT panel positive. Placed on coumadin  for 2 months   HTN (hypertension)    Hypokalemia    a. 11/2015: resolved on K supplementation and spironolactone    Hypothyroidism    Chase (implantable cardioverter-defibrillator) in place    a. Arthur Visia AF MRI (serial  Number [id number]) Chase.   Myocardial stunning    a. 11/2015: EF down to 20-25% after VF arrest but improved to ~68 by subsequent cardiac MR.   NICM (nonischemic cardiomyopathy) (HCC)    Obesity    PAF (paroxysmal atrial fibrillation) (HCC)    a. 11/2015 brief run  of afib with RVR while intubated during a complicated admission for VF arrest and VDRF. No recurrence on amio. CHADSVASC 2 (CHF, HTN) and on coumadin  for HIT with positive thrombotic markers. If no long term recurrence, he may be able to come off [facility]   Ventricular tachycardia (HCC) 10/2015   Past Surgical History:  Procedure  Laterality Date   CARDIAC CATHETERIZATION N/A 11/16/2015   Procedure: Left Heart Cath and Coronary Angiography;  Surgeon: Arthur M Swaziland, MD;  Location: [facility] INVASIVE CV LAB;  Service: Cardiovascular;  Laterality: N/A;   EP IMPLANTABLE DEVICE N/A 12/01/2015   Procedure: Chase Implant;  Surgeon: Arthur Gladis Norton, MD;  Location: [facility] INVASIVE CV LAB;  Service: Cardiovascular;  Laterality: N/A;   KNEE ARTHROSCOPY Right 01/14/2020   Procedure: right knee arthroscopy partial medial meniscectomy, removal of loose body;  Surgeon: Arthur Oneil BROCKS, MD;  Location: [facility] OR;  Service: Orthopedics;  Laterality: Right;    Current Outpatient Medications  Medication Sig Dispense Refill   albuterol  (VENTOLIN  HFA) 108 (90 Base) MCG/ACT inhaler Inhale 2 puffs into the lungs every 6 (six) hours as needed for wheezing or shortness of breath. 6.7 g 0   apixaban  (ELIQUIS ) 5 MG TABS tablet Take 1 tablet (5 mg total) by mouth 2 (two) times daily. 180 tablet 1   atorvastatin (LIPITOR) 10 MG tablet Take 10 mg by mouth daily.     fluticasone (FLONASE) 50 MCG/ACT nasal spray Place 1 spray into both nostrils daily.     levothyroxine  (SYNTHROID ) 75 MCG tablet Take 75 mcg by mouth daily before breakfast.      losartan  (COZAAR ) 25 MG tablet TAKE 1/2 TABLET BY MOUTH DAILY 45 tablet 3   metoprolol  succinate (TOPROL -XL) 100 MG 24 hr tablet TAKE 1 TABLET BY MOUTH EVERY DAY 90 tablet 3   mexiletine (MEXITIL ) 200 MG capsule TAKE 1 CAPSULE BY MOUTH TWICE A DAY 180 capsule 1   Multiple Vitamin (MULTIVITAMIN WITH MINERALS) TABS tablet Take 1 tablet by mouth daily. Centrum Silver     pantoprazole  (PROTONIX ) 40 MG tablet TAKE 1 TABLET BY MOUTH EVERY DAY 90 tablet 3   potassium chloride  SA (KLOR-CON  M20) 20 MEQ tablet TAKE 1 TABLET BY MOUTH TWICE A DAY 180 tablet 3   spironolactone  (ALDACTONE ) 25 MG tablet Take 1 tablet (25 mg total) by mouth daily. 90 tablet 3   No current facility-administered medications for this encounter.    Allergies   Allergen Reactions   Amoxicillin     Other reaction(s): headaches   Heparin  Other (See Comments)    Thrombocytopenia. 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  Physical Exam: Vitals:   09/15/23 0830  BP: 100/82  Pulse: 80  Weight: 106.1 kg  Height: 5' 11 (1.803 m)    GEN- The patient is well appearing, alert and oriented x 3 today.   Neck - no JVD or carotid bruit noted Lungs- Clear to ausculation bilaterally, normal work of breathing Heart- Irregular rate and rhythm, no murmurs, rubs or gallops, PMI not laterally displaced Extremities- no clubbing, cyanosis, or edema Skin - no rash or ecchymosis noted   Wt Readings from Last 3 Encounters:  09/15/23 106.1 kg  08/25/23 107.5 kg  12/17/22 104.3 kg    EKG today demonstrates  Vent. rate 80 BPM PR interval * ms QRS duration  118 ms QT/QTcB 376/433 ms P-R-T axes * -57 66 Atrial fibrillation Left axis deviation Non-specific intra-ventricular conduction delay Abnormal ECG When compared with ECG of 25-Aug-2023 08:37, No significant change was found  Echo 10/08/19 demonstrated  1. Left ventricular ejection fraction, by estimation, is 55 to 60%. The  left ventricle has normal function. The left ventricle has no regional  wall motion abnormalities. Left ventricular diastolic function could not  be evaluated.   2. Right ventricular systolic function is normal. The right ventricular  size is normal. There is normal pulmonary artery systolic pressure.   3. The mitral valve is abnormal. No evidence of mitral stenosis.   4. The aortic valve is tricuspid. Aortic valve regurgitation is not  visualized. No aortic stenosis is present.   5. The inferior vena cava is normal in size with greater than 50%  respiratory variability, suggesting right atrial pressure of 3 mmHg.    Comparison(s): No significant change from prior study.   Conclusion(s)/Recommendation(s): Normal biventricular function without  evidence of hemodynamically significant valvular heart disease.   Epic records are reviewed at length today  CHA2DS2-VASc Score = 2  The patient's score is based upon: CHF History: 0 HTN History: 1 Diabetes History: 0 Stroke History: 0 Vascular Disease History: 0 Age Score: 1 Gender Score: 0       ASSESSMENT AND PLAN: 1. Persistent Atrial Fibrillation (ICD10:  I48.0) The patient's CHA2DS2-VASc score is 2, indicating a 2.2% annual risk of stroke.    He is currently in Afib. Education provided about Afib. Discussion about triggers for Afib and medication treatments and ablation going forward if indicated. We discussed the procedure cardioversion to try to convert to NSR. We discussed the risks vs benefits of this procedure and how ultimately we cannot predict whether a patient Arthur have early return of arrhythmia post procedure. After discussion, the patient wishes to proceed with cardioversion. Labs drawn today. Arthur schedule cardioversion > 3 weeks after uninterrupted anticoagulation.  Informed Consent   Shared Decision Making/Informed Consent The risks (stroke, cardiac arrhythmias rarely resulting in the need for a temporary or permanent pacemaker, skin irritation or burns and complications associated with conscious sedation including aspiration, arrhythmia, respiratory failure and death), benefits (restoration of normal sinus rhythm) and alternatives of a direct current cardioversion were explained in detail to Mr. Ostrand and he agrees to proceed.       2. Secondary Hypercoagulable State (ICD10:  D68.69) The patient is at significant risk for stroke/thromboembolism based upon his CHA2DS2-VASc Score of 2.  Continue Warfarin (Coumadin ).  Transitioned from coumadin  to Eliquis  5 mg BID on 6/13 per coumadin  clinic notes. No missed doses.  3.  Obesity Body mass index is 32.61 kg/m. Encouraged daily activity as tolerated.  4. HTN Stable today.   5. VF cardiac arrest S/p Chase, on mexiletine.  Followed by Dr Arthur.  6. NICM No fluid overload symptoms currently.    Follow up 2 weeks after DCCV.   Dorn Heinrich, PA-C Afib Clinic [facility] [address] [place], [place] [address] [phone] 09/15/2023 9:02 AM

## 2023-09-15 NOTE — Patient Instructions (Addendum)
 Cardioversion scheduled for: Tuesday July 8    - Arrive at the [employer] [facility] ([address])  and check in with ADMITTING at 6:30   - Do not eat or drink anything after midnight the night prior to your procedure.   - Take all your morning medication (except diabetic medications) with a sip of water  prior to arrival.  - Do NOT miss any doses of your blood thinner - if you should miss a dose or take a dose more than 4 hours late -- please notify our office immediately.  - You will not be able to drive home after your procedure. Please ensure you have a responsible adult to drive you home. You will need someone with you for 24 hours post procedure.     - Expect to be in the procedural area approximately 2 hours.   - If you feel as if you go back into normal rhythm prior to scheduled cardioversion, please notify our office immediately.   If your procedure is canceled in the cardioversion suite you will be charged a cancellation fee.

## 2023-09-16 ENCOUNTER — Ambulatory Visit (HOSPITAL_COMMUNITY): Payer: Self-pay | Admitting: Internal Medicine

## 2023-09-16 LAB — BASIC METABOLIC PANEL WITH GFR
BUN/Creatinine Ratio: 16 (ref 10–24)
BUN: 15 mg/dL (ref 8–27)
CO2: 18 mmol/L — ABNORMAL LOW (ref 20–29)
Calcium: 9.9 mg/dL (ref 8.6–10.2)
Chloride: 105 mmol/L (ref 96–106)
Creatinine, Ser: 0.92 mg/dL (ref 0.76–1.27)
Glucose: 91 mg/dL (ref 70–99)
Potassium: 4.5 mmol/L (ref 3.5–5.2)
Sodium: 139 mmol/L (ref 134–144)
eGFR: 91 mL/min/{1.73_m2} (ref >59)

## 2023-09-16 LAB — CBC
Hematocrit: 45.6 % (ref 37.5–51.0)
Hemoglobin: 15.5 g/dL (ref 13.0–17.7)
MCH: 31.8 pg (ref 26.6–33.0)
MCHC: 34 g/dL (ref 31.5–35.7)
MCV: 93 fL (ref 79–97)
Platelets: 229 10*3/uL (ref 150–450)
RBC: 4.88 x10E6/uL (ref 4.14–5.80)
RDW: 12.3 % (ref 11.6–15.4)
WBC: 8.7 10*3/uL (ref 3.4–10.8)

## 2023-09-20 ENCOUNTER — Other Ambulatory Visit: Payer: Self-pay | Admitting: Cardiology

## 2023-09-26 NOTE — Anesthesia Preprocedure Evaluation (Signed)
 Anesthesia Evaluation  Patient identified by MRN, date of birth, ID band Patient awake    Reviewed: Allergy & Precautions, NPO status , Patient's Chart, lab work & pertinent test results  Airway Mallampati: II  TM Distance: >3 FB Neck ROM: Limited    Dental no notable dental hx. (+) Teeth Intact   Pulmonary former smoker H/o COVID in 09/2019   Pulmonary exam normal breath sounds clear to auscultation       Cardiovascular hypertension, Pt. on home beta blockers and Pt. on medications +CHF  + Cardiac Defibrillator (on warfarin (holding for 5 days per cards rec))  Rhythm:Irregular Rate:Normal  EKG: 10/24/19: Normal sinus rhythm Left axis deviation Abnormal ECG Since last tracing Atrial fibrillation has resolved Confirmed by Shlomo Corning [phone]) on 10/24/2019 8:32:27 PM    CV: Echo 10/08/19: 1. Left ventricular ejection fraction, by estimation, is 55 to 60%. The left ventricle has normal function. The left ventricle has no regional wall motion abnormalities. Left ventricular diastolic function could not be evaluated.  2. Right ventricular systolic function is normal. The right ventricular size is normal. There is normal pulmonary artery systolic pressure.  3. The mitral valve is abnormal. No evidence of mitral stenosis.  4. The aortic valve is tricuspid. Aortic valve regurgitation is not visualized. No aortic stenosis is present.  5. The inferior vena cava is normal in size with greater than 50% respiratory variability, suggesting right atrial pressure of 3 mmHg.   - Comparison(s): No significant change from prior study.  - Conclusion(s)/Recommendation(s): Normal biventricular function without evidence of hemodynamically significant valvular heart disease.     Neuro/Psych Anoxic encephalopathy from out of hospital arrest in 2017    GI/Hepatic Neg liver ROS,GERD  Medicated,,  Endo/Other  Hypothyroidism     Renal/GU negative Renal ROS     Musculoskeletal  (+) Arthritis ,    Abdominal  (+) + obese  Peds  Hematology   Anesthesia Other Findings   Reproductive/Obstetrics                              Anesthesia Physical Anesthesia Plan  ASA: 3  Anesthesia Plan: General   Post-op Pain Management: Minimal or no pain anticipated   Induction: Intravenous  PONV Risk Score and Plan: 2 and Ondansetron , Dexamethasone  and Treatment may vary due to age or medical condition  Airway Management Planned:   Additional Equipment:   Intra-op Plan:   Post-operative Plan:   Informed Consent: I have reviewed the patients History and Physical, chart, labs and discussed the procedure including the risks, benefits and alternatives for the proposed anesthesia with the patient or authorized representative who has indicated his/her understanding and acceptance.     Dental advisory given  Plan Discussed with: CRNA  Anesthesia Plan Comments: ( )         Anesthesia Quick Evaluation

## 2023-09-26 NOTE — Progress Notes (Signed)
 Spoke to patient and instructed them to come at 0630  and to be NPO after 0000.  Medications reviewed and instructions given.    Confirmed that patient will have a ride home and someone to stay with them for 24 hours after the procedure.

## 2023-09-27 ENCOUNTER — Other Ambulatory Visit: Payer: Self-pay

## 2023-09-27 ENCOUNTER — Ambulatory Visit (HOSPITAL_COMMUNITY): Payer: Medicare (Managed Care) | Admitting: Anesthesiology

## 2023-09-27 ENCOUNTER — Encounter (HOSPITAL_COMMUNITY)
Admission: RE | Disposition: A | Discharge status: Home / Self Care | Payer: Self-pay | Source: Home / Self Care | Attending: Cardiology

## 2023-09-27 ENCOUNTER — Encounter (HOSPITAL_COMMUNITY): Payer: Self-pay | Admitting: Cardiology

## 2023-09-27 ENCOUNTER — Ambulatory Visit (HOSPITAL_COMMUNITY)
Admission: RE | Admit: 2023-09-27 | Discharge: 2023-09-27 | Disposition: A | Discharge status: Home / Self Care | Payer: Medicare (Managed Care) | Attending: Cardiology | Admitting: Cardiology

## 2023-09-27 ENCOUNTER — Encounter (HOSPITAL_COMMUNITY): Payer: Self-pay | Admitting: Anesthesiology

## 2023-09-27 DIAGNOSIS — E039 Hypothyroidism, unspecified: Secondary | ICD-10-CM | POA: Insufficient documentation

## 2023-09-27 DIAGNOSIS — I4891 Unspecified atrial fibrillation: Secondary | ICD-10-CM

## 2023-09-27 DIAGNOSIS — Z8674 Personal history of sudden cardiac arrest: Secondary | ICD-10-CM | POA: Diagnosis not present

## 2023-09-27 DIAGNOSIS — I4819 Other persistent atrial fibrillation: Secondary | ICD-10-CM | POA: Diagnosis not present

## 2023-09-27 DIAGNOSIS — K219 Gastro-esophageal reflux disease without esophagitis: Secondary | ICD-10-CM | POA: Insufficient documentation

## 2023-09-27 DIAGNOSIS — Z95810 Presence of automatic (implantable) cardiac defibrillator: Secondary | ICD-10-CM | POA: Diagnosis not present

## 2023-09-27 DIAGNOSIS — I428 Other cardiomyopathies: Secondary | ICD-10-CM | POA: Diagnosis not present

## 2023-09-27 DIAGNOSIS — E669 Obesity, unspecified: Secondary | ICD-10-CM | POA: Insufficient documentation

## 2023-09-27 DIAGNOSIS — D6869 Other thrombophilia: Secondary | ICD-10-CM | POA: Diagnosis not present

## 2023-09-27 DIAGNOSIS — Z79899 Other long term (current) drug therapy: Secondary | ICD-10-CM | POA: Diagnosis not present

## 2023-09-27 DIAGNOSIS — I5020 Unspecified systolic (congestive) heart failure: Secondary | ICD-10-CM

## 2023-09-27 DIAGNOSIS — Z7901 Long term (current) use of anticoagulants: Secondary | ICD-10-CM | POA: Insufficient documentation

## 2023-09-27 DIAGNOSIS — I509 Heart failure, unspecified: Secondary | ICD-10-CM | POA: Insufficient documentation

## 2023-09-27 DIAGNOSIS — Z6832 Body mass index (BMI) 32.0-32.9, adult: Secondary | ICD-10-CM | POA: Diagnosis not present

## 2023-09-27 DIAGNOSIS — I110 Hypertensive heart disease with heart failure: Secondary | ICD-10-CM

## 2023-09-27 DIAGNOSIS — Z87891 Personal history of nicotine dependence: Secondary | ICD-10-CM

## 2023-09-27 HISTORY — PX: CARDIOVERSION: EP1203

## 2023-09-27 SURGERY — CARDIOVERSION (CATH LAB)
Anesthesia: General

## 2023-09-27 MED ORDER — SODIUM CHLORIDE 0.9% FLUSH: 3.0000 mL | Freq: Two times a day (BID) | INTRAVENOUS | Status: DC

## 2023-09-27 MED ORDER — SODIUM CHLORIDE 0.9% FLUSH: 3.0000 mL | INTRAVENOUS | Status: DC | PRN

## 2023-09-27 MED ORDER — LIDOCAINE HCL (CARDIAC) PF 100 MG/5ML IV SOSY
PREFILLED_SYRINGE | INTRAVENOUS | Status: DC | PRN
Start: 2023-09-27 — End: 2023-09-27
  Administered 2023-09-27: 80 mg via INTRAVENOUS

## 2023-09-27 MED ORDER — PROPOFOL 10 MG/ML IV BOLUS
INTRAVENOUS | Status: DC | PRN
  Administered 2023-09-27: 20 mg via INTRAVENOUS
  Administered 2023-09-27: 60 mg via INTRAVENOUS
  Administered 2023-09-27: 20 mg via INTRAVENOUS

## 2023-09-27 SURGICAL SUPPLY — 1 items: PAD DEFIB RADIO PHYSIO CONN (PAD) ×1 IMPLANT

## 2023-09-27 NOTE — Discharge Instructions (Signed)

## 2023-09-27 NOTE — Anesthesia Postprocedure Evaluation (Signed)
 Anesthesia Post Note  Patient: AYREN ZUMBRO  Procedure(s) Performed: CARDIOVERSION     Patient location during evaluation: Cath Lab Anesthesia Type: General Level of consciousness: sedated and patient cooperative Pain management: pain level controlled Vital Signs Assessment: post-procedure vital signs reviewed and stable Respiratory status: spontaneous breathing Cardiovascular status: stable Anesthetic complications: no   No notable events documented.  Last Vitals:  Vitals:   09/27/23 0825 09/27/23 0830  BP: (!) 112/94 (!) 121/91  Pulse: 70 84  Resp: 15 16  Temp:  36.7 C  SpO2: 93% 95%    Last Pain:  Vitals:   09/27/23 0830  TempSrc: Temporal  PainSc:                  Norleen Pope

## 2023-09-27 NOTE — Interval H&P Note (Signed)
 History and Physical Interval Note:  09/27/2023 7:34 AM  Arthur Chase  has presented today for surgery, with the diagnosis of AFIB.  The various methods of treatment have been discussed with the patient and family. After consideration of risks, benefits and other options for treatment, the patient has consented to  Procedure(s): CARDIOVERSION (N/A) as a surgical intervention.  The patient's history has been reviewed, patient examined, no change in status, stable for surgery.  I have reviewed the patient's chart and labs.  Questions were answered to the patient's satisfaction.     Wilbert Bihari

## 2023-09-27 NOTE — CV Procedure (Signed)
    Electrical Cardioversion Procedure Note Arthur Chase [id number] [birth year]  Procedure: Electrical Cardioversion Indications:  Atrial Fibrillation  Time Out: Verified patient identification, verified procedure,medications/allergies/relevent history reviewed, required imaging and test results available.  Performed  Procedure Details  During this procedure the patient is administered a total of Propofol  100 mg and Lidocaine  80 mg to achieve and maintain moderate conscious sedation.  The patient's heart rate, blood pressure, and oxygen saturation are monitored continuously during the procedure. The period of conscious sedation is 5 minutes, of which I was present face-to-face 100% of this time. Arthur Barefoot, CRNA is an independent, trained observer who assisted in the monitoring of the patient's level of consciousness.     Cardioversion was done with synchronized biphasic defibrillation with AP pads with 200watts.  The patient failed to convert to NSR.  The patient converted to normal sinus rhythm. Cardioversion was done with synchronized biphasic defibrillation with AP pads with 300watts.  THe patient failed to convert to NSR.  ardioversion was done with synchronized biphasic defibrillation with AP pads with 360 watts.  The patient converted to NSR. The patient tolerated the procedure well   IMPRESSION:  Successful cardioversion of atrial fibrillation    Arthur Chase 09/27/2023, 7:36 AM

## 2023-09-27 NOTE — Transfer of Care (Signed)
 Immediate Anesthesia Transfer of Care Note  Patient: Arthur Chase  Procedure(s) Performed: CARDIOVERSION  Patient Location: PACU and Cath Lab  Anesthesia Type:General  Level of Consciousness: awake and alert   Airway & Oxygen Therapy: Patient Spontanous Breathing and Patient connected to nasal cannula oxygen  Post-op Assessment: Report given to RN and Post -op Vital signs reviewed and stable  Post vital signs: stable  Last Vitals:  Vitals Value Taken Time  BP    Temp    Pulse 70 09/27/23 07:44  Resp 14 09/27/23 07:44  SpO2 98 % 09/27/23 07:44  Vitals shown include unfiled device data.  Last Pain:  Vitals:   09/27/23 0705  TempSrc: Temporal  PainSc: 0-No pain         Complications: No notable events documented.

## 2023-10-14 DIAGNOSIS — I480 Paroxysmal atrial fibrillation: Secondary | ICD-10-CM | POA: Diagnosis not present

## 2023-10-14 DIAGNOSIS — I10 Essential (primary) hypertension: Secondary | ICD-10-CM | POA: Diagnosis not present

## 2023-10-14 DIAGNOSIS — I428 Other cardiomyopathies: Secondary | ICD-10-CM | POA: Diagnosis not present

## 2023-10-14 DIAGNOSIS — I5032 Chronic diastolic (congestive) heart failure: Secondary | ICD-10-CM | POA: Diagnosis not present

## 2023-10-18 ENCOUNTER — Ambulatory Visit (HOSPITAL_COMMUNITY)
Admission: RE | Admit: 2023-10-18 | Discharge: 2023-10-18 | Disposition: A | Discharge status: Home / Self Care | Payer: Medicare (Managed Care) | Source: Ambulatory Visit | Attending: Internal Medicine | Admitting: Internal Medicine

## 2023-10-18 VITALS — BP 106/78 | HR 62 | Ht 71.0 in | Wt 235.6 lb

## 2023-10-18 DIAGNOSIS — D6869 Other thrombophilia: Secondary | ICD-10-CM | POA: Diagnosis not present

## 2023-10-18 DIAGNOSIS — I4819 Other persistent atrial fibrillation: Secondary | ICD-10-CM

## 2023-10-18 DIAGNOSIS — I4891 Unspecified atrial fibrillation: Secondary | ICD-10-CM | POA: Diagnosis not present

## 2023-10-18 DIAGNOSIS — L814 Other melanin hyperpigmentation: Secondary | ICD-10-CM | POA: Diagnosis not present

## 2023-10-18 DIAGNOSIS — D225 Melanocytic nevi of trunk: Secondary | ICD-10-CM | POA: Diagnosis not present

## 2023-10-18 DIAGNOSIS — L570 Actinic keratosis: Secondary | ICD-10-CM | POA: Diagnosis not present

## 2023-10-18 DIAGNOSIS — Z5181 Encounter for therapeutic drug level monitoring: Secondary | ICD-10-CM

## 2023-10-18 DIAGNOSIS — Z08 Encounter for follow-up examination after completed treatment for malignant neoplasm: Secondary | ICD-10-CM | POA: Diagnosis not present

## 2023-10-18 DIAGNOSIS — L821 Other seborrheic keratosis: Secondary | ICD-10-CM | POA: Diagnosis not present

## 2023-10-18 DIAGNOSIS — Z85828 Personal history of other malignant neoplasm of skin: Secondary | ICD-10-CM | POA: Diagnosis not present

## 2023-10-18 DIAGNOSIS — L578 Other skin changes due to chronic exposure to nonionizing radiation: Secondary | ICD-10-CM | POA: Diagnosis not present

## 2023-10-18 NOTE — Progress Notes (Signed)
 Primary Care Physician: Chrystal Lamarr RAMAN, MD Primary Cardiologist: Dr Wonda Primary Electrophysiologist: Dr Inocencio Referring Physician: Dr Inocencio Arthur Chase Joeann is a 68 y.o. 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 He was admitted 11/16/15 with cardiac arrest and EMS strips demonstrating multiple episodes of VT/VF with a cycle length ~263msec. He was successfully rescuscitated - underwent a prolonged hospital admission s/p cooling. Underwent ICD implantation with a Medtronic ICD on 12/01/15. Was put on amiodarone . Had received ICD shocks in the past.  He was thus started on mexiletine. Patient is on warfarin for a CHADS2VASC score of 3. He was hospitalized on 10/06/19 with COVID pneumonia and developed afib with RVR during admission. He is back in SR today. He states he feels great.   On evaluation today, he is currently in Afib. Seen by Jodie Passey, PA-C, on 6/5 and noted to be in Afib. He does not have cardiac awareness. He notes to feel more tired. He is on mexiletine 200 mg BID. Transitioned from coumadin  to Eliquis  5 mg BID. He has not missed any doses of OAC. Patient drinks 2 cups of coffee daily and does not drink alcohol.  On follow up 10/18/23, he is currently in NSR. S/p DCCV on 09/27/23 x 3 shocks (converted with 3rd shock at 360J). He is on mexiletine 200 mg BID. He feels better. No missed doses of Eliquis  5 mg BID.   Today, he denies symptoms of palpitations, chest pain, shortness of breath, orthopnea, PND, lower extremity edema, dizziness, presyncope, syncope, snoring, daytime somnolence, bleeding, or  neurologic sequela. The patient is tolerating medications without difficulties and is otherwise without complaint today.    Atrial Fibrillation Risk Factors:  he does not have symptoms or diagnosis of sleep apnea. he does not have a history of rheumatic fever.   he has a BMI of Body mass index is 32.86 kg/m.[name] Filed Weights   10/18/23 1115  Weight: 106.9 kg     Family History  Problem Relation Age of Onset   Heart attack Mother    Alzheimer's disease Father     Atrial Fibrillation Management history:  Previous antiarrhythmic drugs: amiodarone , mexiletine  Previous cardioversions: 09/27/23 Previous ablations: none CHADS2VASC score: 3 Anticoagulation history: warfarin, Eliquis     Past Medical History:  Diagnosis Date   AICD (automatic cardioverter/defibrillator) present    Anoxic encephalopathy (HCC) 10/2015   a. 10/2015 in setting of cardiac arrest - recovered prior to DC.   Cardiac arrest with ventricular fibrillation (HCC)    a. 11/2015: Unclear etiology. Nl cors on cath 8/27. S/P cooling. On amiodarone  and s/p ICD for secondary prevention on 12/01/15. 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 11/2015 during admission for VF arrest. HIT panel positive. Placed on coumadin  for 2 months   HTN (hypertension)    Hypokalemia    a. 11/2015: resolved on K supplementation and spironolactone    Hypothyroidism    ICD (implantable cardioverter-defibrillator) in place    a. Medtronic Visia AF MRI (serial  Number [id number]) ICD.   Myocardial stunning    a.  11/2015: EF down to 20-25% after VF arrest but improved to ~68 by subsequent cardiac MR.   NICM (nonischemic cardiomyopathy) (HCC)    Obesity    PAF (paroxysmal atrial fibrillation) (HCC)    a. 11/2015 brief run of afib with RVR while intubated during a complicated admission for VF arrest and VDRF. No recurrence on amio. CHADSVASC 2 (CHF,  HTN) and on coumadin  for HIT with positive thrombotic markers. If no long term recurrence, he may be able to come off [facility]   Ventricular tachycardia (HCC) 10/2015   Past Surgical History:  Procedure Laterality Date   CARDIAC CATHETERIZATION N/A 11/16/2015   Procedure: Left Heart Cath and Coronary Angiography;  Surgeon: Peter M Swaziland, MD;  Location: [facility] INVASIVE CV LAB;  Service: Cardiovascular;  Laterality: N/A;   CARDIOVERSION N/A 09/27/2023   Procedure: CARDIOVERSION;  Surgeon: Shlomo Wilbert SAUNDERS, MD;  Location: [facility] INVASIVE CV LAB;  Service: Cardiovascular;  Laterality: N/A;   EP IMPLANTABLE DEVICE N/A 12/01/2015   Procedure: ICD Implant;  Surgeon: Will Gladis Norton, MD;  Location: [facility] INVASIVE CV LAB;  Service: Cardiovascular;  Laterality: N/A;   KNEE ARTHROSCOPY Right 01/14/2020   Procedure: right knee arthroscopy partial medial meniscectomy, removal of loose body;  Surgeon: Barbarann Oneil BROCKS, MD;  Location: [facility] OR;  Service: Orthopedics;  Laterality: Right;    Current Outpatient Medications  Medication Sig Dispense Refill   albuterol  (VENTOLIN  HFA) 108 (90 Base) MCG/ACT inhaler Inhale 2 puffs into the lungs every 6 (six) hours as needed for wheezing or shortness of breath. 6.7 g 0   apixaban  (ELIQUIS ) 5 MG TABS tablet Take 1 tablet (5 mg total) by mouth 2 (two) times daily. 180 tablet 1   atorvastatin (LIPITOR) 10 MG tablet Take 10 mg by mouth daily.     fluticasone (FLONASE) 50 MCG/ACT nasal spray Place 1 spray into both nostrils daily.     levothyroxine  (SYNTHROID ) 75 MCG tablet Take 75 mcg by mouth daily before breakfast.      losartan  (COZAAR ) 25 MG tablet TAKE 1/2 TABLET BY MOUTH DAILY 45 tablet 3   metoprolol  succinate (TOPROL -XL) 100 MG 24 hr tablet TAKE 1 TABLET BY MOUTH EVERY DAY 90 tablet 1   mexiletine (MEXITIL ) 200 MG capsule TAKE 1 CAPSULE BY MOUTH TWICE A DAY 180 capsule 1   Multiple Vitamin (MULTIVITAMIN WITH MINERALS) TABS tablet Take 1 tablet by mouth daily. Centrum Silver      pantoprazole  (PROTONIX ) 40 MG tablet TAKE 1 TABLET BY MOUTH EVERY DAY 90 tablet 3   potassium chloride  SA (KLOR-CON  M20) 20 MEQ tablet TAKE 1 TABLET BY MOUTH TWICE A DAY 180 tablet 3   spironolactone  (ALDACTONE ) 25 MG tablet Take 1 tablet (25 mg total) by mouth daily. 90 tablet 3   No current facility-administered medications for this encounter.    Allergies  Allergen Reactions   Amoxicillin     headaches   Heparin  Other (See Comments)    Thrombocytopenia. 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  Physical Exam: Vitals:   10/18/23 1115  BP: 106/78  Pulse: 62  Weight: 106.9 kg  Height: 5' 11 (1.803 m)    GEN- The patient is well appearing, alert and oriented x 3 today.   Neck - no JVD or carotid bruit noted Lungs- Clear to ausculation bilaterally, normal work  of breathing Heart- Regular rate and rhythm, no murmurs, rubs or gallops, PMI not laterally displaced Extremities- no clubbing, cyanosis, or edema Skin - no rash or ecchymosis noted  Wt Readings from Last 3 Encounters:  10/18/23 106.9 kg  09/15/23 106.1 kg  08/25/23 107.5 kg    EKG today demonstrates  Vent. rate 62 BPM PR interval 210 ms QRS duration 116 ms QT/QTcB 400/406 ms P-R-T axes 62 -53 55 Sinus rhythm with 1st degree A-V block Left axis deviation Abnormal ECG When compared with ECG of 27-Sep-2023 08:06, No significant change was found  Echo 10/08/19 demonstrated  1. Left ventricular ejection fraction, by estimation, is 55 to 60%. The  left ventricle has normal function. The left ventricle has no regional  wall motion abnormalities. Left ventricular diastolic function could not  be evaluated.   2. Right ventricular systolic function is normal. The right ventricular  size is normal. There is normal pulmonary artery systolic pressure.   3. The mitral valve  is abnormal. No evidence of mitral stenosis.   4. The aortic valve is tricuspid. Aortic valve regurgitation is not  visualized. No aortic stenosis is present.   5. The inferior vena cava is normal in size with greater than 50%  respiratory variability, suggesting right atrial pressure of 3 mmHg.   Comparison(s): No significant change from prior study.   Conclusion(s)/Recommendation(s): Normal biventricular function without  evidence of hemodynamically significant valvular heart disease.   Epic records are reviewed at length today  CHA2DS2-VASc Score = 3  The patient's score is based upon: CHF History: 1 HTN History: 1 Diabetes History: 0 Stroke History: 0 Vascular Disease History: 0 Age Score: 1 Gender Score: 0       ASSESSMENT AND PLAN: 1. Persistent Atrial Fibrillation (ICD10:  I48.0) The patient's CHA2DS2-VASc score is 3, indicating a 3.2% annual risk of stroke.   S/p DCCV on 09/27/23 x 3 shocks.  He is currently in NSR. Continue Toprol  100 mg daily.   2. Secondary Hypercoagulable State (ICD10:  D68.69) The patient is at significant risk for stroke/thromboembolism based upon his CHA2DS2-VASc Score of 3.  Continue Warfarin (Coumadin ).  Transitioned from coumadin  to Eliquis  5 mg BID on 6/13 per coumadin  clinic notes.   No missed doses of Eliquis  5 mg BID.   3. Obesity Body mass index is 32.86 kg/m. Exercise as tolerated.  4. HTN Stable today.   5. VF cardiac arrest S/p ICD. Continue mexiletine 200 mg BID.  Followed by Dr Inocencio.  6. NICM Appears euvolemic today.    Follow up 6 months with Dr. Inocencio.    Dorn Heinrich, PA-C Afib Clinic [facility] [address] [place], [place] [address] [phone] 10/18/2023 1:35 PM

## 2023-10-20 DIAGNOSIS — I428 Other cardiomyopathies: Secondary | ICD-10-CM | POA: Diagnosis not present

## 2023-10-20 DIAGNOSIS — I10 Essential (primary) hypertension: Secondary | ICD-10-CM | POA: Diagnosis not present

## 2023-10-20 DIAGNOSIS — I480 Paroxysmal atrial fibrillation: Secondary | ICD-10-CM | POA: Diagnosis not present

## 2023-10-20 DIAGNOSIS — I5032 Chronic diastolic (congestive) heart failure: Secondary | ICD-10-CM | POA: Diagnosis not present

## 2023-11-02 ENCOUNTER — Ambulatory Visit (INDEPENDENT_AMBULATORY_CARE_PROVIDER_SITE_OTHER): Payer: Medicare (Managed Care)

## 2023-11-02 DIAGNOSIS — I428 Other cardiomyopathies: Secondary | ICD-10-CM

## 2023-11-03 LAB — CUP PACEART REMOTE DEVICE CHECK
Battery Remaining Longevity: 41 mo
Battery Voltage: 2.96 V
Brady Statistic RV Percent Paced: 0.32 %
Date Time Interrogation Session: 20250813001804
HighPow Impedance: 88 Ohm
Implantable Lead Connection Status: 753985
Implantable Lead Implant Date: 20170911
Implantable Lead Location: 753860
Implantable Pulse Generator Implant Date: 20170911
Lead Channel Impedance Value: 342 Ohm
Lead Channel Impedance Value: 456 Ohm
Lead Channel Pacing Threshold Amplitude: 0.625 V
Lead Channel Pacing Threshold Pulse Width: 0.4 ms
Lead Channel Sensing Intrinsic Amplitude: 10.5 mV
Lead Channel Sensing Intrinsic Amplitude: 10.5 mV
Lead Channel Setting Pacing Amplitude: 2.5 V
Lead Channel Setting Pacing Pulse Width: 0.4 ms
Lead Channel Setting Sensing Sensitivity: 0.3 mV
Zone Setting Status: 755011
Zone Setting Status: 755011

## 2023-11-05 ENCOUNTER — Ambulatory Visit: Payer: Self-pay | Admitting: Cardiology

## 2023-11-13 DIAGNOSIS — I5032 Chronic diastolic (congestive) heart failure: Secondary | ICD-10-CM | POA: Diagnosis not present

## 2023-11-13 DIAGNOSIS — I480 Paroxysmal atrial fibrillation: Secondary | ICD-10-CM | POA: Diagnosis not present

## 2023-11-13 DIAGNOSIS — I428 Other cardiomyopathies: Secondary | ICD-10-CM | POA: Diagnosis not present

## 2023-11-13 DIAGNOSIS — I10 Essential (primary) hypertension: Secondary | ICD-10-CM | POA: Diagnosis not present

## 2023-11-17 ENCOUNTER — Other Ambulatory Visit: Payer: Self-pay | Admitting: Cardiology

## 2023-11-20 DIAGNOSIS — I10 Essential (primary) hypertension: Secondary | ICD-10-CM | POA: Diagnosis not present

## 2023-11-20 DIAGNOSIS — I428 Other cardiomyopathies: Secondary | ICD-10-CM | POA: Diagnosis not present

## 2023-11-20 DIAGNOSIS — I480 Paroxysmal atrial fibrillation: Secondary | ICD-10-CM | POA: Diagnosis not present

## 2023-11-20 DIAGNOSIS — I5032 Chronic diastolic (congestive) heart failure: Secondary | ICD-10-CM | POA: Diagnosis not present

## 2023-11-27 ENCOUNTER — Other Ambulatory Visit: Payer: Self-pay | Admitting: Physician Assistant

## 2023-11-27 ENCOUNTER — Other Ambulatory Visit: Payer: Self-pay | Admitting: Cardiovascular Disease

## 2023-11-29 DIAGNOSIS — L570 Actinic keratosis: Secondary | ICD-10-CM | POA: Diagnosis not present

## 2023-11-29 DIAGNOSIS — L578 Other skin changes due to chronic exposure to nonionizing radiation: Secondary | ICD-10-CM | POA: Diagnosis not present

## 2023-11-29 DIAGNOSIS — L821 Other seborrheic keratosis: Secondary | ICD-10-CM | POA: Diagnosis not present

## 2023-12-08 ENCOUNTER — Other Ambulatory Visit: Payer: Self-pay | Admitting: Cardiology

## 2023-12-13 DIAGNOSIS — I10 Essential (primary) hypertension: Secondary | ICD-10-CM | POA: Diagnosis not present

## 2023-12-13 DIAGNOSIS — I5032 Chronic diastolic (congestive) heart failure: Secondary | ICD-10-CM | POA: Diagnosis not present

## 2023-12-13 DIAGNOSIS — I428 Other cardiomyopathies: Secondary | ICD-10-CM | POA: Diagnosis not present

## 2023-12-13 DIAGNOSIS — I480 Paroxysmal atrial fibrillation: Secondary | ICD-10-CM | POA: Diagnosis not present

## 2023-12-15 NOTE — Progress Notes (Signed)
Remote ICD Transmission.

## 2023-12-20 DIAGNOSIS — I480 Paroxysmal atrial fibrillation: Secondary | ICD-10-CM | POA: Diagnosis not present

## 2023-12-20 DIAGNOSIS — I428 Other cardiomyopathies: Secondary | ICD-10-CM | POA: Diagnosis not present

## 2023-12-20 DIAGNOSIS — I10 Essential (primary) hypertension: Secondary | ICD-10-CM | POA: Diagnosis not present

## 2023-12-20 DIAGNOSIS — I5032 Chronic diastolic (congestive) heart failure: Secondary | ICD-10-CM | POA: Diagnosis not present

## 2023-12-22 ENCOUNTER — Other Ambulatory Visit: Payer: Self-pay | Admitting: Cardiovascular Disease

## 2024-01-02 ENCOUNTER — Other Ambulatory Visit: Payer: Self-pay | Admitting: Cardiovascular Disease

## 2024-01-12 DIAGNOSIS — I5032 Chronic diastolic (congestive) heart failure: Secondary | ICD-10-CM | POA: Diagnosis not present

## 2024-01-12 DIAGNOSIS — I10 Essential (primary) hypertension: Secondary | ICD-10-CM | POA: Diagnosis not present

## 2024-01-12 DIAGNOSIS — I428 Other cardiomyopathies: Secondary | ICD-10-CM | POA: Diagnosis not present

## 2024-01-12 DIAGNOSIS — I480 Paroxysmal atrial fibrillation: Secondary | ICD-10-CM | POA: Diagnosis not present

## 2024-01-18 ENCOUNTER — Encounter: Payer: Self-pay | Admitting: Cardiovascular Disease

## 2024-01-18 ENCOUNTER — Ambulatory Visit: Payer: Medicare (Managed Care) | Attending: Cardiovascular Disease | Admitting: Cardiovascular Disease

## 2024-01-18 VITALS — BP 120/87 | HR 75 | Ht 71.0 in | Wt 243.0 lb

## 2024-01-18 DIAGNOSIS — I428 Other cardiomyopathies: Secondary | ICD-10-CM

## 2024-01-18 DIAGNOSIS — I10 Essential (primary) hypertension: Secondary | ICD-10-CM

## 2024-01-18 DIAGNOSIS — E782 Mixed hyperlipidemia: Secondary | ICD-10-CM | POA: Diagnosis not present

## 2024-01-18 DIAGNOSIS — I472 Ventricular tachycardia, unspecified: Secondary | ICD-10-CM | POA: Diagnosis not present

## 2024-01-18 DIAGNOSIS — I480 Paroxysmal atrial fibrillation: Secondary | ICD-10-CM

## 2024-01-18 NOTE — Assessment & Plan Note (Signed)
 Managed with mexiletine.

## 2024-01-18 NOTE — Assessment & Plan Note (Signed)
 Status post cardioversion earlier this year.  Tolerating apixaban  without bleeding problems.  Continue metoprolol  succinate.  Followed by Dr. Inocencio as well as the A-fib clinic.

## 2024-01-18 NOTE — Progress Notes (Signed)
 Cardiology Office Note:    Date:  01/18/2024   ID:  Arthur Chase, DOB 12/23/1955, MRN [id number]  PCP:  Arthur Lamarr RAMAN, MD   [facility] Providers Cardiologist:  Arthur Fell, MD Electrophysiologist:  Arthur Gladis Norton, MD     Referring MD: Arthur Chase, *   Chief Complaint  Patient presents with   Atrial Fibrillation    History of Present Illness:    Arthur Chase is a 68 y.o. male with a hx of:  HFimpEF (heart failure with improved ejection fraction)  Non-ischemic cardiomyopathy  Echocardiogram 8/17: EF < 20 LHC 10/2015: No CAD CMR 9/17: EF 68, no LGE Echocardiogram 7/21: EF 55-60, no RWMA, normal RVSF  Hx of OOH cardiac arrest S/p ICD Ventricular tachycardia Mexiletine Rx  Paroxysmal atrial fibrillation  Warfarin Rx--->transitioned to apixaban  2025 Elective DCCV 2025 Hx of HIT Hypertension  Hyperlipidemia Hx of COVID-19 pneumonia C/b AF w RVR  The patient is here alone today.  Earlier this year he was found to be in atrial fibrillation.  He was transition from warfarin to apixaban  and then electively set up for cardioversion.  He required 3 shocks but has reportedly maintained sinus rhythm since then.  He had no clear awareness of his atrial fibrillation but may have had some increased fatigue.  He denies heart palpitations, chest pain, or shortness of breath.  The patient is compliant with his medical therapy.  No other complaints reported at this time.  Current Medications: Current Meds  Medication Sig   albuterol  (VENTOLIN  HFA) 108 (90 Base) MCG/ACT inhaler Inhale 2 puffs into the lungs every 6 (six) hours as needed for wheezing or shortness of breath.   apixaban  (ELIQUIS ) 5 MG TABS tablet Take 1 tablet (5 mg total) by mouth 2 (two) times daily.   atorvastatin (LIPITOR) 10 MG tablet Take 10 mg by mouth daily.   fluticasone (FLONASE) 50 MCG/ACT nasal spray Place 1 spray into both nostrils daily.   levothyroxine  (SYNTHROID ) 75  MCG tablet Take 75 mcg by mouth daily before breakfast.    losartan  (COZAAR ) 25 MG tablet TAKE 1/2 TABLET BY MOUTH DAILY   metoprolol  succinate (TOPROL -XL) 100 MG 24 hr tablet TAKE 1 TABLET BY MOUTH EVERY DAY   mexiletine (MEXITIL ) 200 MG capsule TAKE 1 CAPSULE BY MOUTH TWICE A DAY   Multiple Vitamin (MULTIVITAMIN WITH MINERALS) TABS tablet Take 1 tablet by mouth daily. Centrum Silver   pantoprazole  (PROTONIX ) 40 MG tablet TAKE 1 TABLET BY MOUTH EVERY DAY   potassium chloride  SA (KLOR-CON  M) 20 MEQ tablet TAKE 1 TABLET BY MOUTH TWICE A DAY   spironolactone  (ALDACTONE ) 25 MG tablet Take 1 tablet (25 mg total) by mouth daily. Please call [phone] to schedule an overdue appointment with Dr. Fell for future refills. Thank you. 2nd attempt.     Allergies:   Amoxicillin, Heparin , and Heparin  sodium (porcine)   ROS:   Please see the history of present illness.    All other systems reviewed and are negative.  EKGs/Labs/Other Studies Reviewed:    The following studies were reviewed today: Cardiac Studies & Procedures   ______________________________________________________________________________________________ CARDIAC CATHETERIZATION  CARDIAC CATHETERIZATION 11/16/2015  Conclusion  The left ventricular systolic function is normal.  LV end diastolic pressure is normal.  The left ventricular ejection fraction is 55-65% by visual estimate.  1. Normal coronary anatomy 2. Normal LV function  Plan: monitor in ICU. Further work up and management per CCM.  Findings Coronary Findings Diagnostic  Dominance:  Right  Left Main Vessel was injected. Vessel is normal in caliber. Vessel is angiographically normal.  Left Anterior Descending Vessel was injected. Vessel is large. Vessel is angiographically normal.  Left Circumflex Vessel was injected. Vessel is large. Vessel is angiographically normal.  Right Coronary Artery Vessel was injected. Vessel is normal in caliber. Vessel is  angiographically normal.  Intervention  No interventions have been documented.     ECHOCARDIOGRAM  ECHOCARDIOGRAM COMPLETE 10/08/2019  Narrative ECHOCARDIOGRAM LIMITED REPORT    Patient Name:   Arthur Chase Date of Exam: 10/08/2019 Medical Rec #:  [id number]        Height:       71.0 in Accession #:    [id number]       Weight:       217.0 lb Date of Birth:  11/18/1955         BSA:          2.183 m Patient Age:    64 years         BP:           97/70 mmHg Patient Gender: M                HR:           78 bpm. Exam Location:  Inpatient  Procedure: Limited Echo, Cardiac Doppler and Color Doppler  Indications:    CHF-Acute Diastolic 428.31 / I50.31  History:        Patient has prior history of Echocardiogram examinations, most recent 11/15/2018. Defibrillator, Arrythmias:Atrial Fibrillation; Risk Factors:Hypertension. NICM. COVID-19.  Sonographer:    Rome Eans RDCS (AE) Referring Phys: [id number] LAV[address] POUR [facility]   Sonographer Comments: Suboptimal parasternal window. COVID-19. IMPRESSIONS   1. Left ventricular ejection fraction, by estimation, is 55 to 60%. The left ventricle has normal function. The left ventricle has no regional wall motion abnormalities. Left ventricular diastolic function could not be evaluated. 2. Right ventricular systolic function is normal. The right ventricular size is normal. There is normal pulmonary artery systolic pressure. 3. The mitral valve is abnormal. No evidence of mitral stenosis. 4. The aortic valve is tricuspid. Aortic valve regurgitation is not visualized. No aortic stenosis is present. 5. The inferior vena cava is normal in size with greater than 50% respiratory variability, suggesting right atrial pressure of 3 mmHg.  Comparison(s): No significant change from prior study.  Conclusion(s)/Recommendation(s): Normal biventricular function without evidence of hemodynamically significant valvular heart disease.  FINDINGS Left Ventricle:  Left ventricular ejection fraction, by estimation, is 55 to 60%. The left ventricle has normal function. The left ventricle has no regional wall motion abnormalities. The left ventricular internal cavity size was normal in size. Left ventricular diastolic function could not be evaluated due to atrial fibrillation.  Right Ventricle: The right ventricular size is normal. Right ventricular systolic function is normal. There is normal pulmonary artery systolic pressure. The tricuspid regurgitant velocity is 2.31 m/s, and with an assumed right atrial pressure of 3 mmHg, the estimated right ventricular systolic pressure is 24.3 mmHg.  Left Atrium: Left atrial size was not assessed.  Right Atrium: Right atrial size was not assessed.  Pericardium: There is no evidence of pericardial effusion. Presence of pericardial fat pad.  Mitral Valve: The mitral valve is abnormal. 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 No evidence of mitral valve stenosis.  Tricuspid Valve: The tricuspid valve is normal in structure. Tricuspid valve regurgitation is trivial. No evidence of tricuspid  stenosis.  Aortic Valve: The aortic valve is tricuspid. Aortic valve regurgitation is not visualized. No aortic stenosis is present. Aortic valve mean gradient measures 3.0 mmHg. Aortic valve peak gradient measures 4.2 mmHg. Aortic valve area, by VTI measures 2.53 cm.  Pulmonic Valve: The pulmonic valve was not well visualized.  Aorta: The aortic root is normal in size and structure.  Venous: The inferior vena cava is normal in size with greater than 50% respiratory variability, suggesting right atrial pressure of 3 mmHg.  IAS/Shunts: The atrial septum is grossly normal.  Additional Comments: A pacer wire is visualized in the right atrium and right ventricle. LEFT VENTRICLE PLAX 2D LVOT diam:     2.10 cm LV SV:         48 LV SV Index:   22 LVOT Area:     3.46 cm   IVC IVC  diam: 1.60 cm  AORTIC VALVE AV Area (Vmax):    2.81 cm AV Area (Vmean):   2.18 cm AV Area (VTI):     2.53 cm AV Vmax:           103.00 cm/s AV Vmean:          77.100 cm/s AV VTI:            0.192 m AV Peak Grad:      4.2 mmHg AV Mean Grad:      3.0 mmHg LVOT Vmax:         83.60 cm/s LVOT Vmean:        48.600 cm/s LVOT VTI:          0.140 m LVOT/AV VTI ratio: 0.73  TRICUSPID VALVE TR Peak grad:   21.3 mmHg TR Vmax:        231.00 cm/s  SHUNTS Systemic VTI:  0.14 m Systemic Diam: 2.10 cm  Shelda Bruckner MD Electronically signed by Shelda Bruckner MD Signature Date/Time: 10/08/2019/11:52:25 AM    Final        CARDIAC MRI  MR CARDIAC MORPHOLOGY W WO CONTRAST 11/26/2015  Narrative CLINICAL DATA:  Cardiac Arrest  EXAM: CARDIAC MRI  TECHNIQUE: The patient was scanned on a 1.5 Tesla GE magnet. A dedicated cardiac coil was used. Functional imaging was done using Fiesta sequences. 2,3, and 4 chamber views were done to assess for RWMA's. Modified Simpson's rule using a short axis stack was used to calculate an ejection fraction on a dedicated work [employer]. The patient received 28 cc of Multihance . After 10 minutes inversion recovery sequences were used to assess for infiltration and scar tissue.  CONTRAST:  28 cc Multihance   FINDINGS: All 4 cardiac chambers were normal in size and function. There was no ASD/VSD or pericardial effusion. The septum was 14 mm with no evidence of HOCM or SAM. The quantitative EF was 68% (EDV 130 cc ESV 41 cc SV 89 cc) Delayed enhancement showed no scar, infiltration or infarct. RV size and function were normal Dysplasia sequences not performed  IMPRESSION: 1) Moderate LVH EF 68% no RWMA;s  2) No delayed gadolinium uptake No scar/infiltration of LV myocardium  3) Normal RV size and function  Maude Emmer   Electronically Signed By: Maude Emmer M.D. On: 11/26/2015  10:13   ______________________________________________________________________________________________      EKG:        Recent Labs: 08/05/2023: Magnesium  2.1 09/15/2023: BUN 15; Creatinine, Ser 0.92; Hemoglobin 15.5; Platelets 229; Potassium 4.5; Sodium 139  Recent Lipid Panel    Component Value Date/Time  TRIG 136 10/06/2019 1340     Risk Assessment/Calculations:    CHA2DS2-VASc Score = 3   This indicates a 3.2% annual risk of stroke. The patient's score is based upon: CHF History: 1 HTN History: 1 Diabetes History: 0 Stroke History: 0 Vascular Disease History: 0 Age Score: 1 Gender Score: 0               Physical Exam:    VS:  BP 120/87   Pulse 75   Ht 5' 11 (1.803 m)   Wt 243 lb (110.2 kg)   SpO2 95%   BMI 33.89 kg/m     Wt Readings from Last 3 Encounters:  01/18/24 243 lb (110.2 kg)  10/18/23 235 lb 9.6 oz (106.9 kg)  09/15/23 233 lb 12.8 oz (106.1 kg)     GEN:  Well nourished, well developed in no acute distress HEENT: Normal NECK: No JVD; No carotid bruits LYMPHATICS: No lymphadenopathy CARDIAC: RRR, no murmurs, rubs, gallops RESPIRATORY:  Clear to auscultation without rales, wheezing or rhonchi  ABDOMEN: Soft, non-tender, non-distended MUSCULOSKELETAL:  No edema; No deformity  SKIN: Warm and dry NEUROLOGIC:  Alert and oriented x 3 PSYCHIATRIC:  Normal affect   Assessment & Plan Paroxysmal atrial fibrillation (HCC) Status post cardioversion earlier this year.  Tolerating apixaban  without bleeding problems.  Continue metoprolol  succinate.  Followed by Dr. Inocencio as well as the A-fib clinic. Ventricular tachycardia (HCC) Managed with mexiletine. Mixed hyperlipidemia Treated with atorvastatin.  LDL cholesterol is 87.  No history of atherosclerotic CAD. Essential hypertension Blood pressure well-controlled.  States that he is followed through a program with his PCP and his average blood pressure runs about 120/70s.  Continue losartan  and  metoprolol  succinate.  Also on Aldactone . NICM (nonischemic cardiomyopathy) (HCC) Recommend updated echocardiogram.  Initially with severe LV dysfunction, then normalized with last echo in 2021 showing normal LVEF.  Now with recurrent atrial fibrillation, recommend repeat echocardiogram.            Medication Adjustments/Labs and Tests Ordered: Current medicines are reviewed at length with the patient today.  Concerns regarding medicines are outlined above.  Orders Placed This Encounter  Procedures   ECHOCARDIOGRAM COMPLETE   No orders of the defined types were placed in this encounter.   Patient Instructions  Medication Instructions:  Your physician recommends that you continue on your current medications as directed. Please refer to the Current Medication list given to you today.  *If you need a refill on your cardiac medications before your next appointment, please call your pharmacy*  Testing/Procedures: Your physician has requested that you have an echocardiogram. Echocardiography is a painless test that uses sound waves to create images of your heart. It provides your doctor with information about the size and shape of your heart and how well your heart's chambers and valves are working. This procedure takes approximately one hour. There are no restrictions for this procedure. Please do NOT wear cologne, perfume, aftershave, or lotions (deodorant is allowed). Please arrive 15 minutes prior to your appointment time.  Please note: We ask at that you not bring children with you during ultrasound (echo/ vascular) testing. Due to room size and safety concerns, children are not allowed in the ultrasound rooms during exams. Our front office staff cannot provide observation of children in our lobby area while testing is being conducted. An adult accompanying a patient to their appointment will only be allowed in the ultrasound room at the discretion of the ultrasound technician under  special  circumstances. We apologize for any inconvenience.  Follow-Up: At [facility], you and your health needs are our priority.  As part of our continuing mission to provide you with exceptional heart care, our providers are all part of one team.  This team includes your primary Cardiologist (physician) and Advanced Practice Providers or APPs (Physician Assistants and Nurse Practitioners) who all work together to provide you with the care you need, when you need it.  Your next appointment:   1 year(s)  Provider:   Ozell Fell, MD  We recommend signing up for the patient portal called MyChart.  Sign up information is provided on this After Visit Summary.  MyChart is used to connect with patients for Virtual Visits (Telemedicine).  Patients are able to view lab/test results, encounter notes, upcoming appointments, etc.  Non-urgent messages can be sent to your provider as well.    To learn more about what you can do with MyChart, go to [email or web address].    Signed, Arthur Fell, MD  01/18/2024 4:51 PM    [facility]

## 2024-01-18 NOTE — Patient Instructions (Signed)
 Medication Instructions:  Your physician recommends that you continue on your current medications as directed. Please refer to the Current Medication list given to you today.  *If you need a refill on your cardiac medications before your next appointment, please call your pharmacy*  Testing/Procedures: Your physician has requested that you have an echocardiogram. Echocardiography is a painless test that uses sound waves to create images of your heart. It provides your doctor with information about the size and shape of your heart and how well your heart's chambers and valves are working. This procedure takes approximately one hour. There are no restrictions for this procedure. Please do NOT wear cologne, perfume, aftershave, or lotions (deodorant is allowed). Please arrive 15 minutes prior to your appointment time.  Please note: We ask at that you not bring children with you during ultrasound (echo/ vascular) testing. Due to room size and safety concerns, children are not allowed in the ultrasound rooms during exams. Our front office staff cannot provide observation of children in our lobby area while testing is being conducted. An adult accompanying a patient to their appointment will only be allowed in the ultrasound room at the discretion of the ultrasound technician under special circumstances. We apologize for any inconvenience.  Follow-Up: At [facility], you and your health needs are our priority.  As part of our continuing mission to provide you with exceptional heart care, our providers are all part of one team.  This team includes your primary Cardiologist (physician) and Advanced Practice Providers or APPs (Physician Assistants and Nurse Practitioners) who all work together to provide you with the care you need, when you need it.  Your next appointment:   1 year(s)  Provider:   Ozell Fell, MD  We recommend signing up for the patient portal called MyChart.  Sign up  information is provided on this After Visit Summary.  MyChart is used to connect with patients for Virtual Visits (Telemedicine).  Patients are able to view lab/test results, encounter notes, upcoming appointments, etc.  Non-urgent messages can be sent to your provider as well.    To learn more about what you can do with MyChart, go to [email or web address].

## 2024-01-18 NOTE — Assessment & Plan Note (Signed)
 Recommend updated echocardiogram.  Initially with severe LV dysfunction, then normalized with last echo in 2021 showing normal LVEF.  Now with recurrent atrial fibrillation, recommend repeat echocardiogram.

## 2024-01-20 DIAGNOSIS — I480 Paroxysmal atrial fibrillation: Secondary | ICD-10-CM | POA: Diagnosis not present

## 2024-01-20 DIAGNOSIS — I428 Other cardiomyopathies: Secondary | ICD-10-CM | POA: Diagnosis not present

## 2024-01-20 DIAGNOSIS — I10 Essential (primary) hypertension: Secondary | ICD-10-CM | POA: Diagnosis not present

## 2024-01-20 DIAGNOSIS — I5032 Chronic diastolic (congestive) heart failure: Secondary | ICD-10-CM | POA: Diagnosis not present

## 2024-01-24 ENCOUNTER — Ambulatory Visit (HOSPITAL_COMMUNITY)
Admission: RE | Admit: 2024-01-24 | Discharge: 2024-01-24 | Disposition: A | Discharge status: Home / Self Care | Payer: Medicare (Managed Care) | Source: Ambulatory Visit | Attending: Cardiovascular Disease | Admitting: Cardiovascular Disease

## 2024-01-24 DIAGNOSIS — I480 Paroxysmal atrial fibrillation: Secondary | ICD-10-CM | POA: Insufficient documentation

## 2024-01-24 LAB — ECHOCARDIOGRAM COMPLETE
Calc EF: 42 %
Est EF: 30
S' Lateral: 3.61 cm
Single Plane A2C EF: 34.9 %
Single Plane A4C EF: 42.9 %

## 2024-01-24 MED ORDER — PERFLUTREN LIPID MICROSPHERE
1.0000 mL | INTRAVENOUS | Status: AC | PRN
  Administered 2024-01-24: 2 mL via INTRAVENOUS

## 2024-01-29 ENCOUNTER — Ambulatory Visit: Payer: Self-pay | Admitting: Cardiovascular Disease

## 2024-02-01 ENCOUNTER — Ambulatory Visit: Payer: Self-pay

## 2024-02-01 DIAGNOSIS — I480 Paroxysmal atrial fibrillation: Secondary | ICD-10-CM | POA: Diagnosis not present

## 2024-02-01 LAB — CUP PACEART REMOTE DEVICE CHECK
Battery Remaining Longevity: 38 mo
Battery Voltage: 2.94 V
Brady Statistic RV Percent Paced: 0.24 %
Date Time Interrogation Session: 20251112061706
HighPow Impedance: 75 Ohm
Implantable Lead Connection Status: 753985
Implantable Lead Implant Date: 20170911
Implantable Lead Location: 753860
Implantable Pulse Generator Implant Date: 20170911
Lead Channel Impedance Value: 342 Ohm
Lead Channel Impedance Value: 456 Ohm
Lead Channel Pacing Threshold Amplitude: 0.625 V
Lead Channel Pacing Threshold Pulse Width: 0.4 ms
Lead Channel Sensing Intrinsic Amplitude: 9.125 mV
Lead Channel Sensing Intrinsic Amplitude: 9.125 mV
Lead Channel Setting Pacing Amplitude: 2.5 V
Lead Channel Setting Pacing Pulse Width: 0.4 ms
Lead Channel Setting Sensing Sensitivity: 0.3 mV
Zone Setting Status: 755011
Zone Setting Status: 755011

## 2024-02-01 MED ORDER — SACUBITRIL-VALSARTAN 24-26 MG PO TABS: 1.0000 | ORAL_TABLET | Freq: Two times a day (BID) | ORAL | 3 refills | Status: AC

## 2024-02-02 ENCOUNTER — Ambulatory Visit: Payer: Medicare (Managed Care)

## 2024-02-02 ENCOUNTER — Telehealth: Payer: Self-pay | Admitting: *Deleted

## 2024-02-02 NOTE — Telephone Encounter (Signed)
 ICD: Scheduled remote reviewed. Normal device function.  Presenting rhythm: irregular VS 139 NSVT, irregular and most likely AF with rapid ventricular response 62 AF events, longest duration >99 hrs, not always good rate control, burden 66.7%, Eliquis  per EPIC Sent to triage due to increased AF burden and AF w/RVR Next remote transmission per protocol. ML, CVRS ________________________________________________________________________________  Patient appears to have gone back into AF as of 12/26/23 with episode in progress since 01/24/24  Called patient to assess for symptoms  Patient states that he feels, lightheaded at least once per week. Usually associated with position changes per patient report  Patient denies experiencing any palpitations or fluttering sensations   Patient confirmed compliancy with Mexitil , Eliquis , and Toprol  medicaitons  Messaged AF clinic and got patient appointment 02/06/24 at 0830

## 2024-02-06 ENCOUNTER — Ambulatory Visit (HOSPITAL_COMMUNITY): Payer: Self-pay | Admitting: Physician Assistant

## 2024-02-06 ENCOUNTER — Ambulatory Visit (HOSPITAL_COMMUNITY)
Admission: RE | Admit: 2024-02-06 | Discharge: 2024-02-06 | Disposition: A | Discharge status: Home / Self Care | Payer: Medicare (Managed Care) | Source: Ambulatory Visit | Attending: Physician Assistant | Admitting: Physician Assistant

## 2024-02-06 VITALS — BP 90/62 | HR 124 | Ht 71.0 in | Wt 240.2 lb

## 2024-02-06 DIAGNOSIS — I4891 Unspecified atrial fibrillation: Secondary | ICD-10-CM | POA: Diagnosis not present

## 2024-02-06 DIAGNOSIS — D6869 Other thrombophilia: Secondary | ICD-10-CM

## 2024-02-06 DIAGNOSIS — I4819 Other persistent atrial fibrillation: Secondary | ICD-10-CM

## 2024-02-06 LAB — CBC
Hematocrit: 46.5 % (ref 37.5–51.0)
Hemoglobin: 15.7 g/dL (ref 13.0–17.7)
MCH: 31.8 pg (ref 26.6–33.0)
MCHC: 33.8 g/dL (ref 31.5–35.7)
MCV: 94 fL (ref 79–97)
Platelets: 211 x10E3/uL (ref 150–450)
RBC: 4.94 x10E6/uL (ref 4.14–5.80)
RDW: 13.2 % (ref 11.6–15.4)
WBC: 8.2 x10E3/uL (ref 3.4–10.8)

## 2024-02-06 LAB — BASIC METABOLIC PANEL WITH GFR
BUN/Creatinine Ratio: 14 (ref 10–24)
BUN: 16 mg/dL (ref 8–27)
CO2: 25 mmol/L (ref 20–29)
Calcium: 9.8 mg/dL (ref 8.6–10.2)
Chloride: 105 mmol/L (ref 96–106)
Creatinine, Ser: 1.13 mg/dL (ref 0.76–1.27)
Glucose: 94 mg/dL (ref 70–99)
Potassium: 4.4 mmol/L (ref 3.5–5.2)
Sodium: 138 mmol/L (ref 134–144)
eGFR: 71 mL/min/1.73 (ref >59)

## 2024-02-06 NOTE — Progress Notes (Signed)
 Pt called for pre procedure instructions. Arrival time 1000 NPO after midnight explained Instructed to take am meds with sip of water and confirmed blood thinner consistency. Instructed pt need for ride home tomorrow and have responsible adult with them for 24 hrs post procedure.

## 2024-02-06 NOTE — Progress Notes (Signed)
 Remote ICD Transmission

## 2024-02-06 NOTE — Patient Instructions (Signed)
 Cardioversion scheduled for: Tuesday November 18th   - Arrive at the [facility] ([address])  and check in with ADMITTING at 11 AM   - Do not eat or drink anything after midnight the night prior to your procedure.   - Take all your morning medication (except diabetic medications) with a sip of water  prior to arrival.  - Do NOT miss any doses of your blood thinner - if you should miss a dose or take a dose more than 4 hours late -- please notify our office immediately.  - You will not be able to drive home after your procedure. Please ensure you have a responsible adult to drive you home. You will need someone with you for 24 hours post procedure.     - Expect to be in the procedural area approximately 2 hours.   - If you feel as if you go back into normal rhythm prior to scheduled cardioversion, please notify our office immediately.   If your procedure is canceled in the cardioversion suite you will be charged a cancellation fee.

## 2024-02-06 NOTE — Progress Notes (Signed)
 Primary Care Physician: Chrystal Lamarr RAMAN, MD Primary Cardiologist: Dr Wonda Primary Electrophysiologist: Dr Inocencio Referring Physician: Dr Inocencio [name] is a 68 y.o. male with a history of obesity, HTN, OOH cardiac arrest of unclear etiology 10/2015 c/b anoxic encephalopathy, cardiogenic shock, VDRF, acute systolic CHF/NICM (EF as low as 20% during 10/2015 hospitalization, up to 68% by cMRI 11/26/15), VF/NSVT, heparin -induced thrombocytopenia (on Coumadin  for this + afib), hypokalemia, and paroxysmal afib who presents for follow-up in the [facility]. He was admitted 11/16/15 with cardiac arrest and EMS strips demonstrating multiple episodes of VT/VF with a cycle length ~299msec. He was successfully rescuscitated - underwent a prolonged hospital admission s/p cooling. Underwent ICD implantation with a Medtronic ICD on 12/01/15. Was put on amiodarone . Had received ICD shocks in the past.  He was thus started on mexiletine. He was hospitalized on 10/06/19 with COVID pneumonia and developed afib with RVR during admission. He is s/p DCCV on 09/27/23.  Patient returns for follow up for atrial fibrillation. He was found by the device clinic to be in afib persistently since 01/24/24. He has noticed more SOB with exertion. He had an echo which showed EF 30%. There were no specific triggers that he could identify. No bleeding issues on anticoagulation.   Today, he  denies symptoms of palpitations, chest pain, orthopnea, PND, lower extremity edema, dizziness, presyncope, syncope, snoring, daytime somnolence, bleeding, or neurologic sequela. The patient is tolerating medications without difficulties and is otherwise without complaint today.    Atrial Fibrillation Risk Factors:  he does not have symptoms or diagnosis of sleep apnea. he does not have a history of rheumatic fever.   Atrial Fibrillation Management history:  Previous antiarrhythmic drugs: amiodarone ,  mexiletine  Previous cardioversions: 09/27/23 Previous ablations: none Anticoagulation history: warfarin, Eliquis     Past Medical History:  Diagnosis Date   AICD (automatic cardioverter/defibrillator) present    Anoxic encephalopathy (HCC) 10/2015   a. 10/2015 in setting of cardiac arrest - recovered prior to DC.   Cardiac arrest with ventricular fibrillation (HCC)    a. 11/2015: Unclear etiology. Nl cors on cath 8/27. S/P cooling. On amiodarone  and s/p ICD for secondary prevention on 12/01/15. 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 11/2015 during admission for VF arrest. HIT panel positive. Placed on coumadin  for 2 months   HTN (hypertension)    Hypokalemia    a. 11/2015: resolved on K supplementation and spironolactone    Hypothyroidism    ICD (implantable cardioverter-defibrillator) in place    a. Medtronic Visia AF MRI (serial  Number [id number]) ICD.   Myocardial stunning    a. 11/2015: EF down to 20-25% after VF arrest but improved to ~68 by subsequent cardiac MR.   NICM (nonischemic cardiomyopathy) (HCC)    Obesity    PAF (paroxysmal atrial fibrillation) (HCC)    a. 11/2015 brief run of afib with RVR while intubated during a complicated admission for VF arrest and VDRF. No recurrence on amio. CHADSVASC 2 (CHF, HTN) and on coumadin  for HIT with positive thrombotic markers. If no long term recurrence, he may be able to come off [facility]   Ventricular tachycardia (HCC) 10/2015    Current Outpatient Medications  Medication Sig Dispense Refill   albuterol  (VENTOLIN  HFA) 108 (90 Base) MCG/ACT inhaler Inhale 2 puffs into the lungs every 6 (six) hours as needed for wheezing or  shortness of breath. 6.7 g 0   apixaban  (ELIQUIS ) 5 MG TABS tablet Take 1 tablet (5 mg total) by mouth 2 (two) times daily. 180 tablet 1   atorvastatin (LIPITOR) 10 MG tablet Take 10 mg by mouth daily.     fluticasone (FLONASE) 50  MCG/ACT nasal spray Place 1 spray into both nostrils daily.     levothyroxine  (SYNTHROID ) 75 MCG tablet Take 75 mcg by mouth daily before breakfast.      metoprolol  succinate (TOPROL -XL) 100 MG 24 hr tablet TAKE 1 TABLET BY MOUTH EVERY DAY 90 tablet 1   mexiletine (MEXITIL ) 200 MG capsule TAKE 1 CAPSULE BY MOUTH TWICE A DAY 180 capsule 3   Multiple Vitamin (MULTIVITAMIN WITH MINERALS) TABS tablet Take 1 tablet by mouth daily. Centrum Silver     pantoprazole  (PROTONIX ) 40 MG tablet TAKE 1 TABLET BY MOUTH EVERY DAY 90 tablet 2   potassium chloride  SA (KLOR-CON  M) 20 MEQ tablet TAKE 1 TABLET BY MOUTH TWICE A DAY 180 tablet 2   sacubitril-valsartan (ENTRESTO) 24-26 MG Take 1 tablet by mouth 2 (two) times daily. 60 tablet 3   spironolactone  (ALDACTONE ) 25 MG tablet Take 1 tablet (25 mg total) by mouth daily. Please call [phone] to schedule an overdue appointment with Dr. Wonda for future refills. Thank you. 2nd attempt. 15 tablet 0   No current facility-administered medications for this encounter.    ROS- All systems are reviewed and negative except as per the HPI above.  Physical Exam: Vitals:   02/06/24 0823  Weight: 109 kg  Height: 5' 11 (1.803 m)    GEN: Well nourished, well developed in no acute distress CARDIAC: Irregularly irregular rate and rhythm, no murmurs, rubs, gallops RESPIRATORY:  Clear to auscultation without rales, wheezing or rhonchi  ABDOMEN: Soft, non-tender, non-distended EXTREMITIES:  No edema; No deformity    Wt Readings from Last 3 Encounters:  02/06/24 109 kg  01/18/24 110.2 kg  10/18/23 106.9 kg    EKG today demonstrates  Afib with RVR, LAFB Vent. rate 124 BPM PR interval * ms QRS duration 112 ms QT/QTcB 322/462 ms   Echo 01/24/24 demonstrated   1. Left ventricular ejection fraction, by estimation, is 30%. The left  ventricle has moderate to severely decreased function. The left ventricle  demonstrates global hypokinesis. There is moderate  asymmetric left  ventricular hypertrophy of the basal-septal segment. Left ventricular diastolic parameters are indeterminate. No LV thrombus.   2. Right ventricular systolic function is mildly reduced. The right  ventricular size is mildly enlarged. There is mildly elevated pulmonary  artery systolic pressure. The estimated right ventricular systolic  pressure is 36.9 mmHg.   3. Left atrial size was moderately dilated.   4. Right atrial size was moderately dilated.   5. The mitral valve is normal in structure. Trivial mitral valve  regurgitation. No evidence of mitral stenosis.   6. The aortic valve is tricuspid. There is mild calcification of the  aortic valve. Aortic valve regurgitation is not visualized. No aortic  stenosis is present.   7. Aortic dilatation noted. There is mild dilatation of the aortic root,  measuring 41 mm.   8. The inferior vena cava is dilated in size with <50% respiratory  variability, suggesting right atrial pressure of 15 mmHg.   9. The patient was in atrial fibrillation.   Epic records are reviewed at length today   CHA2DS2-VASc Score = 3  The patient's score is based upon: CHF History: 1 HTN History: 1  Diabetes History: 0 Stroke History: 0 Vascular Disease History: 0 Age Score: 1 Gender Score: 0       ASSESSMENT AND PLAN: Persistent Atrial Fibrillation (ICD10:  I48.19) The patient's CHA2DS2-VASc score is 3, indicating a 3.2% annual risk of stroke.   Patient back in persistent afib.  We discussed rhythm control options today. Given his reduced EF in afib, I think he would be a good candidate for ablation.    Discussed treatment options today for AF including antiarrhythmic drug therapy and ablation. Discussed risks, recovery and likelihood of success with each treatment strategy. Risk, benefits, and alternatives to EP study and ablation for afib were discussed. These risks include but are not limited to stroke, bleeding, vascular damage,  tamponade, perforation, damage to the esophagus, lungs, phrenic nerve and other structures, pulmonary vein stenosis, worsening renal function, coronary vasospasm and death.  Discussed potential need for repeat ablation procedures and antiarrhythmic drugs after an initial ablation. The patient understands these risk and wishes to proceed today.   Will review with Dr Inocencio.  Continue Eliquis  5 mg BID Continue Toprol  100 mg daily Short term, will plan for DCCV. Check bmet/cbc today.   Secondary Hypercoagulable State (ICD10:  D68.69) The patient is at significant risk for stroke/thromboembolism based upon his CHA2DS2-VASc Score of 3.  Continue Apixaban  (Eliquis ). No bleeding issues.   Obesity Body mass index is 33.5 kg/m.  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  GDMT per primary cardiology team Fluid status appears stable today   Follow up in the AF clinic post DCCV.    Informed Consent   Shared Decision Making/Informed Consent The risks (stroke, cardiac arrhythmias rarely resulting in the need for a temporary or permanent pacemaker, skin irritation or burns and complications associated with conscious sedation including aspiration, arrhythmia, respiratory failure and death), benefits (restoration of normal sinus rhythm) and alternatives of a direct current cardioversion were explained in detail to Mr. Weed and he agrees to proceed.      Daril Kicks PA-C Afib Clinic [facility] [address] [place], [place] [address] [phone] 02/06/2024 8:29 AM

## 2024-02-06 NOTE — CV Procedure (Signed)
    Electrical Cardioversion Procedure Note Arthur Chase [id number] 12/14/1955  Procedure: Electrical Cardioversion Indications:  Atrial Fibrillation  Time Out: Verified patient identification, verified procedure,medications/allergies/relevent history reviewed, required imaging and test results available.  {time out performed:[id number]}  Procedure Details  During this procedure the patient is administered a total of Propofol  *** mg and Lidocaine  *** mg to achieve and maintain moderate conscious sedation.  The patient's heart rate, blood pressure, and oxygen saturation are monitored continuously during the procedure. The period of conscious sedation is *** minutes, of which I was present face-to-face 100% of this time. *** is an independent, trained observer who assisted in the monitoring of the patient's level of consciousness. {TIP  Include the name and credentials for the independent observer  :1}    Cardioversion was done with synchronized biphasic defibrillation with AP pads with 200watts.  The patient converted to normal sinus rhythm. The patient tolerated the procedure well   IMPRESSION:  Successful cardioversion of atrial fibrillation    Arthur Chase 02/06/2024, 7:44 PM

## 2024-02-06 NOTE — H&P (View-Only) (Signed)
 Primary Care Physician: Chrystal Lamarr RAMAN, MD Primary Cardiologist: Dr Wonda Primary Electrophysiologist: Dr Inocencio Referring Physician: Dr Inocencio [name] is a 68 y.o. male with a history of obesity, HTN, OOH cardiac arrest of unclear etiology 10/2015 c/b anoxic encephalopathy, cardiogenic shock, VDRF, acute systolic CHF/NICM (EF as low as 20% during 10/2015 hospitalization, up to 68% by cMRI 11/26/15), VF/NSVT, heparin -induced thrombocytopenia (on Coumadin  for this + afib), hypokalemia, and paroxysmal afib who presents for follow-up in the [facility]. He was admitted 11/16/15 with cardiac arrest and EMS strips demonstrating multiple episodes of VT/VF with a cycle length ~299msec. He was successfully rescuscitated - underwent a prolonged hospital admission s/p cooling. Underwent ICD implantation with a Medtronic ICD on 12/01/15. Was put on amiodarone . Had received ICD shocks in the past.  He was thus started on mexiletine. He was hospitalized on 10/06/19 with COVID pneumonia and developed afib with RVR during admission. He is s/p DCCV on 09/27/23.  Patient returns for follow up for atrial fibrillation. He was found by the device clinic to be in afib persistently since 01/24/24. He has noticed more SOB with exertion. He had an echo which showed EF 30%. There were no specific triggers that he could identify. No bleeding issues on anticoagulation.   Today, he  denies symptoms of palpitations, chest pain, orthopnea, PND, lower extremity edema, dizziness, presyncope, syncope, snoring, daytime somnolence, bleeding, or neurologic sequela. The patient is tolerating medications without difficulties and is otherwise without complaint today.    Atrial Fibrillation Risk Factors:  he does not have symptoms or diagnosis of sleep apnea. he does not have a history of rheumatic fever.   Atrial Fibrillation Management history:  Previous antiarrhythmic drugs: amiodarone ,  mexiletine  Previous cardioversions: 09/27/23 Previous ablations: none Anticoagulation history: warfarin, Eliquis     Past Medical History:  Diagnosis Date   AICD (automatic cardioverter/defibrillator) present    Anoxic encephalopathy (HCC) 10/2015   a. 10/2015 in setting of cardiac arrest - recovered prior to DC.   Cardiac arrest with ventricular fibrillation (HCC)    a. 11/2015: Unclear etiology. Nl cors on cath 8/27. S/P cooling. On amiodarone  and s/p ICD for secondary prevention on 12/01/15. 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 11/2015 during admission for VF arrest. HIT panel positive. Placed on coumadin  for 2 months   HTN (hypertension)    Hypokalemia    a. 11/2015: resolved on K supplementation and spironolactone    Hypothyroidism    ICD (implantable cardioverter-defibrillator) in place    a. Medtronic Visia AF MRI (serial  Number [id number]) ICD.   Myocardial stunning    a. 11/2015: EF down to 20-25% after VF arrest but improved to ~68 by subsequent cardiac MR.   NICM (nonischemic cardiomyopathy) (HCC)    Obesity    PAF (paroxysmal atrial fibrillation) (HCC)    a. 11/2015 brief run of afib with RVR while intubated during a complicated admission for VF arrest and VDRF. No recurrence on amio. CHADSVASC 2 (CHF, HTN) and on coumadin  for HIT with positive thrombotic markers. If no long term recurrence, he may be able to come off [facility]   Ventricular tachycardia (HCC) 10/2015    Current Outpatient Medications  Medication Sig Dispense Refill   albuterol  (VENTOLIN  HFA) 108 (90 Base) MCG/ACT inhaler Inhale 2 puffs into the lungs every 6 (six) hours as needed for wheezing or  shortness of breath. 6.7 g 0   apixaban  (ELIQUIS ) 5 MG TABS tablet Take 1 tablet (5 mg total) by mouth 2 (two) times daily. 180 tablet 1   atorvastatin (LIPITOR) 10 MG tablet Take 10 mg by mouth daily.     fluticasone (FLONASE) 50  MCG/ACT nasal spray Place 1 spray into both nostrils daily.     levothyroxine  (SYNTHROID ) 75 MCG tablet Take 75 mcg by mouth daily before breakfast.      metoprolol  succinate (TOPROL -XL) 100 MG 24 hr tablet TAKE 1 TABLET BY MOUTH EVERY DAY 90 tablet 1   mexiletine (MEXITIL ) 200 MG capsule TAKE 1 CAPSULE BY MOUTH TWICE A DAY 180 capsule 3   Multiple Vitamin (MULTIVITAMIN WITH MINERALS) TABS tablet Take 1 tablet by mouth daily. Centrum Silver     pantoprazole  (PROTONIX ) 40 MG tablet TAKE 1 TABLET BY MOUTH EVERY DAY 90 tablet 2   potassium chloride  SA (KLOR-CON  M) 20 MEQ tablet TAKE 1 TABLET BY MOUTH TWICE A DAY 180 tablet 2   sacubitril-valsartan (ENTRESTO) 24-26 MG Take 1 tablet by mouth 2 (two) times daily. 60 tablet 3   spironolactone  (ALDACTONE ) 25 MG tablet Take 1 tablet (25 mg total) by mouth daily. Please call [phone] to schedule an overdue appointment with Dr. Wonda for future refills. Thank you. 2nd attempt. 15 tablet 0   No current facility-administered medications for this encounter.    ROS- All systems are reviewed and negative except as per the HPI above.  Physical Exam: Vitals:   02/06/24 0823  Weight: 109 kg  Height: 5' 11 (1.803 m)    GEN: Well nourished, well developed in no acute distress CARDIAC: Irregularly irregular rate and rhythm, no murmurs, rubs, gallops RESPIRATORY:  Clear to auscultation without rales, wheezing or rhonchi  ABDOMEN: Soft, non-tender, non-distended EXTREMITIES:  No edema; No deformity    Wt Readings from Last 3 Encounters:  02/06/24 109 kg  01/18/24 110.2 kg  10/18/23 106.9 kg    EKG today demonstrates  Afib with RVR, LAFB Vent. rate 124 BPM PR interval * ms QRS duration 112 ms QT/QTcB 322/462 ms   Echo 01/24/24 demonstrated   1. Left ventricular ejection fraction, by estimation, is 30%. The left  ventricle has moderate to severely decreased function. The left ventricle  demonstrates global hypokinesis. There is moderate  asymmetric left  ventricular hypertrophy of the basal-septal segment. Left ventricular diastolic parameters are indeterminate. No LV thrombus.   2. Right ventricular systolic function is mildly reduced. The right  ventricular size is mildly enlarged. There is mildly elevated pulmonary  artery systolic pressure. The estimated right ventricular systolic  pressure is 36.9 mmHg.   3. Left atrial size was moderately dilated.   4. Right atrial size was moderately dilated.   5. The mitral valve is normal in structure. Trivial mitral valve  regurgitation. No evidence of mitral stenosis.   6. The aortic valve is tricuspid. There is mild calcification of the  aortic valve. Aortic valve regurgitation is not visualized. No aortic  stenosis is present.   7. Aortic dilatation noted. There is mild dilatation of the aortic root,  measuring 41 mm.   8. The inferior vena cava is dilated in size with <50% respiratory  variability, suggesting right atrial pressure of 15 mmHg.   9. The patient was in atrial fibrillation.   Epic records are reviewed at length today   CHA2DS2-VASc Score = 3  The patient's score is based upon: CHF History: 1 HTN History: 1  Diabetes History: 0 Stroke History: 0 Vascular Disease History: 0 Age Score: 1 Gender Score: 0       ASSESSMENT AND PLAN: Persistent Atrial Fibrillation (ICD10:  I48.19) The patient's CHA2DS2-VASc score is 3, indicating a 3.2% annual risk of stroke.   Patient back in persistent afib.  We discussed rhythm control options today. Given his reduced EF in afib, I think he would be a good candidate for ablation.    Discussed treatment options today for AF including antiarrhythmic drug therapy and ablation. Discussed risks, recovery and likelihood of success with each treatment strategy. Risk, benefits, and alternatives to EP study and ablation for afib were discussed. These risks include but are not limited to stroke, bleeding, vascular damage,  tamponade, perforation, damage to the esophagus, lungs, phrenic nerve and other structures, pulmonary vein stenosis, worsening renal function, coronary vasospasm and death.  Discussed potential need for repeat ablation procedures and antiarrhythmic drugs after an initial ablation. The patient understands these risk and wishes to proceed today.   Will review with Dr Inocencio.  Continue Eliquis  5 mg BID Continue Toprol  100 mg daily Short term, will plan for DCCV. Check bmet/cbc today.   Secondary Hypercoagulable State (ICD10:  D68.69) The patient is at significant risk for stroke/thromboembolism based upon his CHA2DS2-VASc Score of 3.  Continue Apixaban  (Eliquis ). No bleeding issues.   Obesity Body mass index is 33.5 kg/m.  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  GDMT per primary cardiology team Fluid status appears stable today   Follow up in the AF clinic post DCCV.    Informed Consent   Shared Decision Making/Informed Consent The risks (stroke, cardiac arrhythmias rarely resulting in the need for a temporary or permanent pacemaker, skin irritation or burns and complications associated with conscious sedation including aspiration, arrhythmia, respiratory failure and death), benefits (restoration of normal sinus rhythm) and alternatives of a direct current cardioversion were explained in detail to Mr. Weed and he agrees to proceed.      Daril Kicks PA-C Afib Clinic [facility] [address] [place], [place] [address] [phone] 02/06/2024 8:29 AM

## 2024-02-07 ENCOUNTER — Ambulatory Visit (HOSPITAL_COMMUNITY): Payer: Medicare (Managed Care) | Admitting: Certified Registered"

## 2024-02-07 ENCOUNTER — Ambulatory Visit: Payer: Self-pay | Admitting: Cardiology

## 2024-02-07 ENCOUNTER — Other Ambulatory Visit: Payer: Self-pay

## 2024-02-07 ENCOUNTER — Observation Stay (HOSPITAL_COMMUNITY)
Admission: RE | Admit: 2024-02-07 | Discharge: 2024-02-08 | Disposition: A | Discharge status: Home / Self Care | Payer: Medicare (Managed Care)

## 2024-02-07 ENCOUNTER — Other Ambulatory Visit: Payer: Self-pay | Admitting: Cardiology

## 2024-02-07 ENCOUNTER — Encounter (HOSPITAL_COMMUNITY): Payer: Self-pay | Admitting: Cardiology

## 2024-02-07 ENCOUNTER — Encounter (HOSPITAL_COMMUNITY): Admission: RE | Disposition: A | Payer: Self-pay | Source: Home / Self Care | Attending: Cardiology

## 2024-02-07 ENCOUNTER — Inpatient Hospital Stay (HOSPITAL_COMMUNITY): Payer: Medicare (Managed Care)

## 2024-02-07 DIAGNOSIS — I472 Ventricular tachycardia, unspecified: Secondary | ICD-10-CM | POA: Diagnosis not present

## 2024-02-07 DIAGNOSIS — I5022 Chronic systolic (congestive) heart failure: Secondary | ICD-10-CM | POA: Diagnosis not present

## 2024-02-07 DIAGNOSIS — D6869 Other thrombophilia: Secondary | ICD-10-CM | POA: Insufficient documentation

## 2024-02-07 DIAGNOSIS — Z79899 Other long term (current) drug therapy: Secondary | ICD-10-CM | POA: Insufficient documentation

## 2024-02-07 DIAGNOSIS — E039 Hypothyroidism, unspecified: Secondary | ICD-10-CM | POA: Diagnosis not present

## 2024-02-07 DIAGNOSIS — I509 Heart failure, unspecified: Secondary | ICD-10-CM | POA: Diagnosis not present

## 2024-02-07 DIAGNOSIS — E785 Hyperlipidemia, unspecified: Secondary | ICD-10-CM | POA: Diagnosis not present

## 2024-02-07 DIAGNOSIS — I4819 Other persistent atrial fibrillation: Secondary | ICD-10-CM | POA: Diagnosis not present

## 2024-02-07 DIAGNOSIS — Z87891 Personal history of nicotine dependence: Secondary | ICD-10-CM | POA: Diagnosis not present

## 2024-02-07 DIAGNOSIS — Z7901 Long term (current) use of anticoagulants: Secondary | ICD-10-CM | POA: Diagnosis not present

## 2024-02-07 DIAGNOSIS — I4891 Unspecified atrial fibrillation: Principal | ICD-10-CM | POA: Diagnosis present

## 2024-02-07 DIAGNOSIS — I421 Obstructive hypertrophic cardiomyopathy: Secondary | ICD-10-CM | POA: Diagnosis not present

## 2024-02-07 DIAGNOSIS — I5020 Unspecified systolic (congestive) heart failure: Secondary | ICD-10-CM | POA: Diagnosis not present

## 2024-02-07 DIAGNOSIS — Z87820 Personal history of traumatic brain injury: Secondary | ICD-10-CM | POA: Diagnosis not present

## 2024-02-07 DIAGNOSIS — K219 Gastro-esophageal reflux disease without esophagitis: Secondary | ICD-10-CM | POA: Diagnosis not present

## 2024-02-07 DIAGNOSIS — Z8616 Personal history of COVID-19: Secondary | ICD-10-CM | POA: Diagnosis not present

## 2024-02-07 DIAGNOSIS — I110 Hypertensive heart disease with heart failure: Secondary | ICD-10-CM | POA: Insufficient documentation

## 2024-02-07 DIAGNOSIS — Z6833 Body mass index (BMI) 33.0-33.9, adult: Secondary | ICD-10-CM | POA: Diagnosis not present

## 2024-02-07 DIAGNOSIS — E669 Obesity, unspecified: Secondary | ICD-10-CM | POA: Diagnosis not present

## 2024-02-07 HISTORY — PX: CARDIOVERSION: EP1203

## 2024-02-07 LAB — CBC WITH DIFFERENTIAL/PLATELET
Abs Immature Granulocytes: 0.02 K/uL (ref 0.00–0.07)
Basophils Absolute: 0 K/uL (ref 0.0–0.1)
Basophils Relative: 1 %
Eosinophils Absolute: 0.2 K/uL (ref 0.0–0.5)
Eosinophils Relative: 3 %
HCT: 46.4 % (ref 39.0–52.0)
Hemoglobin: 16 g/dL (ref 13.0–17.0)
Immature Granulocytes: 0 %
Lymphocytes Relative: 40 %
Lymphs Abs: 2.9 K/uL (ref 0.7–4.0)
MCH: 31.9 pg (ref 26.0–34.0)
MCHC: 34.5 g/dL (ref 30.0–36.0)
MCV: 92.6 fL (ref 80.0–100.0)
Monocytes Absolute: 0.4 K/uL (ref 0.1–1.0)
Monocytes Relative: 5 %
Neutro Abs: 3.7 K/uL (ref 1.7–7.7)
Neutrophils Relative %: 51 %
Platelets: 200 K/uL (ref 150–400)
RBC: 5.01 MIL/uL (ref 4.22–5.81)
RDW: 12.7 % (ref 11.5–15.5)
WBC: 7.3 K/uL (ref 4.0–10.5)
nRBC: 0 % (ref 0.0–0.2)

## 2024-02-07 LAB — COMPREHENSIVE METABOLIC PANEL WITH GFR
ALT: 17 U/L (ref 0–44)
AST: 23 U/L (ref 15–41)
Albumin: 4 g/dL (ref 3.5–5.0)
Alkaline Phosphatase: 54 U/L (ref 38–126)
Anion gap: 12 (ref 5–15)
BUN: 12 mg/dL (ref 8–23)
CO2: 22 mmol/L (ref 22–32)
Calcium: 9.5 mg/dL (ref 8.9–10.3)
Chloride: 104 mmol/L (ref 98–111)
Creatinine, Ser: 0.9 mg/dL (ref 0.61–1.24)
GFR, Estimated: >60 mL/min (ref >60)
Glucose, Bld: 133 mg/dL — ABNORMAL HIGH (ref 70–99)
Potassium: 3.9 mmol/L (ref 3.5–5.1)
Sodium: 138 mmol/L (ref 135–145)
Total Bilirubin: 0.9 mg/dL (ref 0.0–1.2)
Total Protein: 7 g/dL (ref 6.5–8.1)

## 2024-02-07 LAB — HIV ANTIBODY (ROUTINE TESTING W REFLEX): HIV Screen 4th Generation wRfx: NONREACTIVE

## 2024-02-07 LAB — BRAIN NATRIURETIC PEPTIDE: B Natriuretic Peptide: 53.3 pg/mL (ref 0.0–100.0)

## 2024-02-07 LAB — HEMOGLOBIN A1C
Hgb A1c MFr Bld: 5.4 % (ref 4.8–5.6)
Mean Plasma Glucose: 108.28 mg/dL

## 2024-02-07 LAB — MAGNESIUM: Magnesium: 2.2 mg/dL (ref 1.7–2.4)

## 2024-02-07 SURGERY — CARDIOVERSION (CATH LAB)
Anesthesia: Monitor Anesthesia Care

## 2024-02-07 MED ORDER — SPIRONOLACTONE 25 MG PO TABS
25.0000 mg | ORAL_TABLET | Freq: Every day | ORAL | Status: DC
  Administered 2024-02-08: 25 mg via ORAL
  Filled 2024-02-07: qty 1

## 2024-02-07 MED ORDER — ALBUTEROL SULFATE (2.5 MG/3ML) 0.083% IN NEBU: 3.0000 mL | INHALATION_SOLUTION | Freq: Four times a day (QID) | RESPIRATORY_TRACT | Status: DC | PRN

## 2024-02-07 MED ORDER — MEXILETINE HCL 200 MG PO CAPS
200.0000 mg | ORAL_CAPSULE | Freq: Two times a day (BID) | ORAL | Status: DC
  Administered 2024-02-07 – 2024-02-08 (×2): 200 mg via ORAL
  Filled 2024-02-07 (×4): qty 1

## 2024-02-07 MED ORDER — ACETAMINOPHEN 325 MG PO TABS: 650.0000 mg | ORAL_TABLET | ORAL | Status: DC | PRN

## 2024-02-07 MED ORDER — SACUBITRIL-VALSARTAN 24-26 MG PO TABS
1.0000 | ORAL_TABLET | Freq: Two times a day (BID) | ORAL | Status: DC
  Administered 2024-02-07 – 2024-02-08 (×2): 1 via ORAL
  Filled 2024-02-07 (×2): qty 1

## 2024-02-07 MED ORDER — ONDANSETRON HCL 4 MG/2ML IJ SOLN: 4.0000 mg | Freq: Four times a day (QID) | INTRAMUSCULAR | Status: DC | PRN

## 2024-02-07 MED ORDER — LEVOTHYROXINE SODIUM 75 MCG PO TABS
75.0000 ug | ORAL_TABLET | Freq: Every day | ORAL | Status: DC
  Administered 2024-02-08: 75 ug via ORAL
  Filled 2024-02-07: qty 1

## 2024-02-07 MED ORDER — PANTOPRAZOLE SODIUM 40 MG PO TBEC
40.0000 mg | DELAYED_RELEASE_TABLET | Freq: Every day | ORAL | Status: DC
Start: 2024-02-08 — End: 2024-02-08
  Administered 2024-02-08: 40 mg via ORAL
  Filled 2024-02-07: qty 1

## 2024-02-07 MED ORDER — METOPROLOL TARTRATE 25 MG PO TABS: 25.0000 mg | ORAL_TABLET | Freq: Four times a day (QID) | ORAL | Status: DC

## 2024-02-07 MED ORDER — POTASSIUM CHLORIDE CRYS ER 20 MEQ PO TBCR
20.0000 meq | EXTENDED_RELEASE_TABLET | Freq: Once | ORAL | Status: AC
  Administered 2024-02-07: 20 meq via ORAL

## 2024-02-07 MED ORDER — ATORVASTATIN CALCIUM 10 MG PO TABS
10.0000 mg | ORAL_TABLET | Freq: Every day | ORAL | Status: DC
  Administered 2024-02-07 – 2024-02-08 (×2): 10 mg via ORAL
  Filled 2024-02-07 (×2): qty 1

## 2024-02-07 MED ORDER — METOPROLOL TARTRATE 50 MG PO TABS
50.0000 mg | ORAL_TABLET | Freq: Four times a day (QID) | ORAL | Status: DC
  Administered 2024-02-07 – 2024-02-08 (×2): 50 mg via ORAL
  Filled 2024-02-07 (×2): qty 1

## 2024-02-07 MED ORDER — PROPOFOL 10 MG/ML IV BOLUS
INTRAVENOUS | Status: DC | PRN
  Administered 2024-02-07: 30 mg via INTRAVENOUS
  Administered 2024-02-07: 70 mg via INTRAVENOUS

## 2024-02-07 MED ORDER — SODIUM CHLORIDE 0.9 % IV SOLN: INTRAVENOUS | Status: DC

## 2024-02-07 MED ORDER — POTASSIUM CHLORIDE CRYS ER 20 MEQ PO TBCR
EXTENDED_RELEASE_TABLET | ORAL | Status: AC
  Filled 2024-02-07: qty 1

## 2024-02-07 MED ORDER — LIDOCAINE 2% (20 MG/ML) 5 ML SYRINGE
INTRAMUSCULAR | Status: DC | PRN
  Administered 2024-02-07: 100 mg via INTRAVENOUS

## 2024-02-07 MED ORDER — APIXABAN 5 MG PO TABS
5.0000 mg | ORAL_TABLET | Freq: Two times a day (BID) | ORAL | Status: DC
  Administered 2024-02-07 – 2024-02-08 (×2): 5 mg via ORAL
  Filled 2024-02-07 (×2): qty 1

## 2024-02-07 MED ORDER — FLUTICASONE PROPIONATE 50 MCG/ACT NA SUSP
1.0000 | Freq: Every morning | NASAL | Status: DC
  Filled 2024-02-07: qty 16

## 2024-02-07 SURGICAL SUPPLY — 1 items: PAD DEFIB RADIO PHYSIO CONN (PAD) ×1 IMPLANT

## 2024-02-07 NOTE — Anesthesia Postprocedure Evaluation (Signed)
 Anesthesia Post Note  Patient: Arthur Chase  Procedure(s) Performed: CARDIOVERSION     Patient location during evaluation: PACU Anesthesia Type: General Level of consciousness: awake and alert Pain management: pain level controlled Vital Signs Assessment: post-procedure vital signs reviewed and stable Respiratory status: spontaneous breathing, nonlabored ventilation, respiratory function stable and patient connected to nasal cannula oxygen Cardiovascular status: blood pressure returned to baseline and stable Postop Assessment: no apparent nausea or vomiting Anesthetic complications: no   No notable events documented.  Last Vitals:  Vitals:   02/07/24 1054 02/07/24 1428  BP: 101/84 133/82  Pulse: 73   Resp: 15   Temp:    SpO2: 95%     Last Pain:  Vitals:   02/07/24 1428  TempSrc:   PainSc: 0-No pain                 Thom JONELLE Peoples

## 2024-02-07 NOTE — H&P (Addendum)
 Cardiology Admission History and Physical   Patient ID: RAGNAR WAAS MRN: [id number]; DOB: [birth year]   Admission date: 02/07/2024  PCP:  Chrystal Lamarr RAMAN, MD   [facility] Providers Cardiologist:  Ozell Fell, MD  Electrophysiologist:  Soyla Gladis Norton, MD    Chief Complaint:  unsuccessful cardioversion  Patient Profile: Arthur Chase is a 68 y.o. male with paroxysmal atrial fibrillation on Eliquis , hypertension, hyperlipidemia, HIT, nonischemic cardiomyopathy, HFimpEF, VT s/p ICD, Hx of OOH, and cardiac arrest who is being seen 02/07/2024 for the evaluation of atrial fibrillation and heart failure.  History of Present Illness: Mr. Gillen is a 68 year old male with prior cardiac history listed below.  On 10/2015 the patient was hospitalized following a V-fib cardiac arrest.  He received a shock via AED and received CPR.  While and route went into V-fib twice and was successfully defibrillated. Echocardiogram at that time showed a reduced LVEF of less than 20%.  Cardiac catheterization at that time showed no CAD.  Cardiac MRI on 11/2015 showed moderate LVH, a normal LVEF of 68%, no RWMA, normal RV function and size, no LV scar or infiltration, and no delayed gadolinium uptake.  Because of the VT the patient was started on amiodarone  and a Medtronic ICD was implanted a few days later.  During this hospitalization the patient was also diagnosed with atrial fibrillation.  It appears like the amiodarone  was later stopped after the patient developed hypothyroidism.   The patient did receive some shocks from his ICD.  Because of this he was started on mexiletine.  On 09/2019 the patient was hospitalized with COVID-pneumonia.  During this hospitalization he developed A-fib with RVR.  The patient was initially anticoagulated with warfarin.  In 2025 the patient was transition to Eliquis .  On 09/27/2023 the patient had a cardioversion for A-fib with RVR.  The first 2 attempts  were unsuccessful but on the third attempt the patient successfully converted to sinus rhythm.  The patient was found by the device clinic to have an increasing burden of poorly rate controlled atrial fibrillation with RVR.  The patient also reported worsening dyspnea on exertion.  An echocardiogram on 01/24/2024 showed a reduced LVEF of 30% with global hypokinesis, and asymmetric LVH of the basal septal segment.  It was suspected that the A-fib with RVR was likely the cause of the heart failure.  Because of this the patient was set up for a cardioversion today.  The patient was also recommended to get an ablation  A cardioversion was initially attempted with 200 W but was unsuccessful.  Was increased to 360 W and tried twice with both being unsuccessful to convert the rhythm to sinus.  On interview the patient reported that for the past few months he has had worsening shortness of breath and dyspnea on exertion.  The patient did report that he feels like his shortness of breath has improved somewhat over the past few days since he was started on Entresto.  Reported that he also has some palpitations at night with his A-fib.  Denies having any chest pain, orthopnea, PND, cough, nausea, vomiting, fever, chills, diaphoresis, or worsening lower extremity edema.  Denies nicotine use, alcohol use, illicit substance use.  Walks about 1 mile every day.  Is able to do more than 4 metabolic equivalents of exertion.  Had labs drawn yesterday that showed potassium of 4.4, sodium of 138, creatinine of 1.13, WBC count of 8.2, and hemoglobin of 15.7.   EKG yesterday morning showed  A-fib with RVR and a ventricular rate of 124.  Past Medical History:  Diagnosis Date   AICD (automatic cardioverter/defibrillator) present    Anoxic encephalopathy (HCC) 10/2015   a. 10/2015 in setting of cardiac arrest - recovered prior to DC.   Cardiac arrest with ventricular fibrillation (HCC)    a. 11/2015: Unclear etiology. Nl cors  on cath 8/27. S/P cooling. On amiodarone  and s/p ICD for secondary prevention on 12/01/15. 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 11/2015 during admission for VF arrest. HIT panel positive. Placed on coumadin  for 2 months   HTN (hypertension)    Hypokalemia    a. 11/2015: resolved on K supplementation and spironolactone    Hypothyroidism    ICD (implantable cardioverter-defibrillator) in place    a. Medtronic Visia AF MRI (serial  Number [id number]) ICD.   Myocardial stunning    a. 11/2015: EF down to 20-25% after VF arrest but improved to ~68 by subsequent cardiac MR.   NICM (nonischemic cardiomyopathy) (HCC)    Obesity    PAF (paroxysmal atrial fibrillation) (HCC)    a. 11/2015 brief run of afib with RVR while intubated during a complicated admission for VF arrest and VDRF. No recurrence on amio. CHADSVASC 2 (CHF, HTN) and on coumadin  for HIT with positive thrombotic markers. If no long term recurrence, he may be able to come off [facility]   Ventricular tachycardia (HCC) 10/2015   Past Surgical History:  Procedure Laterality Date   CARDIAC CATHETERIZATION N/A 11/16/2015   Procedure: Left Heart Cath and Coronary Angiography;  Surgeon: Peter M Jordan, MD;  Location: [facility] INVASIVE CV LAB;  Service: Cardiovascular;  Laterality: N/A;   CARDIOVERSION N/A 09/27/2023   Procedure: CARDIOVERSION;  Surgeon: Shlomo Wilbert SAUNDERS, MD;  Location: [facility] INVASIVE CV LAB;  Service: Cardiovascular;  Laterality: N/A;   EP IMPLANTABLE DEVICE N/A 12/01/2015   Procedure: ICD Implant;  Surgeon: Will Gladis Norton, MD;  Location: [facility] INVASIVE CV LAB;  Service: Cardiovascular;  Laterality: N/A;   KNEE ARTHROSCOPY Right 01/14/2020   Procedure: right knee arthroscopy partial medial meniscectomy, removal of loose body;  Surgeon: Barbarann Oneil BROCKS, MD;  Location: [facility] OR;  Service: Orthopedics;  Laterality: Right;     Medications Prior to  Admission: Prior to Admission medications   Medication Sig Start Date End Date Taking? Authorizing Provider  albuterol  (VENTOLIN  HFA) 108 (90 Base) MCG/ACT inhaler Inhale 2 puffs into the lungs every 6 (six) hours as needed for wheezing or shortness of breath. 10/10/19  Yes Dennise Lavada POUR, MD  apixaban  (ELIQUIS ) 5 MG TABS tablet Take 1 tablet (5 mg total) by mouth 2 (two) times daily. 08/31/23  Yes Camnitz, Will Gladis, MD  atorvastatin (LIPITOR) 10 MG tablet Take 10 mg by mouth daily.   Yes [provider]  fluticasone (FLONASE) 50 MCG/ACT nasal spray Place 1 spray into both nostrils in the morning.   Yes [provider]  levothyroxine  (SYNTHROID ) 75 MCG tablet Take 75 mcg by mouth daily before breakfast.  04/25/19  Yes [provider]  metoprolol  succinate (TOPROL -XL) 100 MG 24 hr tablet TAKE 1 TABLET BY MOUTH EVERY DAY 09/20/23  Yes Camnitz, Will Gladis, MD  mexiletine (MEXITIL ) 200 MG capsule TAKE 1 CAPSULE BY MOUTH TWICE A DAY 11/17/23  Yes Camnitz, Will Gladis, MD  Multiple Vitamin (MULTIVITAMIN WITH MINERALS) TABS tablet Take 1 tablet by mouth daily. Centrum Silver  Yes [provider]  pantoprazole  (PROTONIX ) 40 MG tablet TAKE 1 TABLET BY MOUTH EVERY DAY 11/28/23  Yes Wonda Sharper, MD  potassium chloride  SA (KLOR-CON  M) 20 MEQ tablet TAKE 1 TABLET BY MOUTH TWICE A DAY 12/08/23  Yes Camnitz, Will Gladis, MD  sacubitril-valsartan (ENTRESTO) 24-26 MG Take 1 tablet by mouth 2 (two) times daily. 02/01/24  Yes Wonda Sharper, MD  spironolactone  (ALDACTONE ) 25 MG tablet Take 1 tablet (25 mg total) by mouth daily. Please call [phone] to schedule an overdue appointment with Dr. Wonda for future refills. Thank you. 2nd attempt. 12/23/23  Yes Wonda Sharper, MD     Allergies:    Allergies  Allergen Reactions   Amoxicillin     headaches   Heparin  Other (See Comments)    Thrombocytopenia. 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   Heparin  Sodium  (Porcine)     Other reaction(s): drops INR level    Social History:   Social History   Socioeconomic History   Marital status: Divorced    Spouse name: Not on file   Number of children: Not on file   Years of education: Not on file   Highest education level: Not on file  Occupational History   Occupation: Engineer, Site  Tobacco Use   Smoking status: Former    Current packs/day: 0.00    Types: Cigarettes    Quit date: 1995    Years since quitting: 30.9   Smokeless tobacco: Never  Vaping Use   Vaping status: Never Used  Substance and Sexual Activity   Alcohol use: No    Comment: quit at age 89   Drug use: No   Sexual activity: Not on file  Other Topics Concern   Not on file  Social History Narrative   Not on file   Social Drivers of Health   Financial Resource Strain: Not on file  Food Insecurity: Not on file  Transportation Needs: Not on file  Physical Activity: Not on file  Stress: Not on file  Social Connections: Not on file  Intimate Partner Violence: Not on file     Family History:   The patient's family history includes Alzheimer's disease in his father; Heart attack in his mother.    ROS:  Please see the history of present illness.  All other ROS reviewed and negative.     Physical Exam/Data: Vitals:   02/07/24 0947 02/07/24 1034 02/07/24 1044 02/07/24 1054  BP: (!) 125/93 112/73 107/69 101/84  Pulse: 85 (!) 102 90 73  Resp: 18 15 15 15   Temp: 97.8 F (36.6 C) 97.7 F (36.5 C)    TempSrc: Temporal Tympanic    SpO2: 92% 95% 97% 95%  Weight: 108.9 kg     Height: 5' 11 (1.803 m)      No intake or output data in the 24 hours ending 02/07/24 1251    02/07/2024    9:47 AM 02/06/2024    8:23 AM 01/18/2024    4:22 PM  Last 3 Weights  Weight (lbs) 240 lb 240 lb 3.2 oz 243 lb  Weight (kg) 108.863 kg 108.954 kg 110.224 kg     Body mass index is 33.47 kg/m.  General:  Well nourished, well developed, in no acute distress.  Alert and orientated  on room air HEENT: normal Neck: no JVD Vascular: No carotid bruits; Distal pulses 2+ bilaterally   Cardiac:  normal S1, S2; irregularly irregular rhythm; no murmur  Lungs:  clear to auscultation bilaterally,  no wheezing, rhonchi or rales  Abd: soft, nontender, no hepatomegaly  Ext: no edema Musculoskeletal:  No deformities, BUE and BLE strength normal and equal Skin: warm and dry  Neuro:   no focal abnormalities noted Psych:  Normal affect   EKG:  EKG yesterday morning showed A-fib with RVR and a ventricular rate of 124.  Relevant CV Studies: Echocardiogram prior to admission on 01/24/2024 IMPRESSIONS     1. Left ventricular ejection fraction, by estimation, is 30%. The left  ventricle has moderate to severely decreased function. The left ventricle  demonstrates global hypokinesis. There is moderate asymmetric left  ventricular hypertrophy of the  basal-septal segment. Left ventricular diastolic parameters are  indeterminate. No LV thrombus.   2. Right ventricular systolic function is mildly reduced. The right  ventricular size is mildly enlarged. There is mildly elevated pulmonary  artery systolic pressure. The estimated right ventricular systolic  pressure is 36.9 mmHg.   3. Left atrial size was moderately dilated.   4. Right atrial size was moderately dilated.   5. The mitral valve is normal in structure. Trivial mitral valve  regurgitation. No evidence of mitral stenosis.   6. The aortic valve is tricuspid. There is mild calcification of the  aortic valve. Aortic valve regurgitation is not visualized. No aortic  stenosis is present.   7. Aortic dilatation noted. There is mild dilatation of the aortic root,  measuring 41 mm.   8. The inferior vena cava is dilated in size with <50% respiratory  variability, suggesting right atrial pressure of 15 mmHg.   9. The patient was in atrial fibrillation.    Laboratory Data: High Sensitivity Troponin:  No results for input(s):  TROPONINIHS in the last 720 hours.    Chemistry Recent Labs  Lab 02/06/24 0000  NA 138  K 4.4  CL 105  CO2 25  GLUCOSE 94  BUN 16  CREATININE 1.13  CALCIUM 9.8    No results for input(s): PROT, ALBUMIN, AST, ALT, ALKPHOS, BILITOT in the last 168 hours. Lipids No results for input(s): CHOL, TRIG, HDL, LABVLDL, LDLCALC, CHOLHDL in the last 168 hours. Hematology Recent Labs  Lab 02/06/24 0000  WBC 8.2  RBC 4.94  HGB 15.7  HCT 46.5  MCV 94  MCH 31.8  MCHC 33.8  RDW 13.2  PLT 211   Thyroid No results for input(s): TSH, FREET4 in the last 168 hours. BNPNo results for input(s): BNP, PROBNP in the last 168 hours.  DDimer No results for input(s): DDIMER in the last 168 hours.  Radiology/Studies:  EP STUDY Result Date: 02/07/2024 See surgical note for result.    Assessment and Plan: EDD REPPERT is a 68 y.o. male with paroxysmal atrial fibrillation on Eliquis , hypertension, hyperlipidemia, HIT, nonischemic cardiomyopathy, HFimpEF, VT s/p ICD, Hx of OOH, and cardiac arrest who is being seen 02/07/2024 for the evaluation of atrial fibrillation and heart failure.   HFrEF On 10/2015 the patient was hospitalized following a V-fib cardiac arrest.  Echo at this time showed a reduced LVEF of less than 20%.  When the patient's LVEF was rechecked it had improved.  On 09/2019 and echo showed the patient had an LVEF of 55 to 60%. An echocardiogram on 01/24/2024 showed a reduced LVEF of 30% with global hypokinesis, and asymmetric LVH of the basal septal segment.  It was suspected that the A-fib with RVR was likely the cause of the heart failure. Prior to admission the patient was on Entresto 24-26 twice daily, Aldactone   25 mg daily, and metoprolol  succinate 100 mg daily.  Order ProBNP. Order BMET and CBC. Order Chest x-ray.   Persistent A-fib with RVR CHA2DS2-VASc Score = 3 [CHF History: 1, HTN History: 1, Diabetes History: 0, Stroke History: 0,  Vascular Disease History: 0, Age Score: 1, Gender Score: 0].  Therefore, the patient's annual risk of stroke is 3.2 %.    The patient was initially anticoagulated on warfarin but transition to Eliquis  earlier on in 2025.  The patient was found by the device clinic to have an increasing burden of poorly rate controlled atrial fibrillation with RVR. On 09/27/2023 the patient had a cardioversion for A-fib with RVR.  The first 2 attempts were unsuccessful but on the third attempt the patient successfully converted to sinus rhythm.  An echocardiogram on 01/24/2024 showed the patient had a worsening LVEF.  The patient was seen by the A-fib clinic and set up to do a cardioversion today with Dr. Shlomo.  The patient was also recommended to be considered for an ablation.  The cardioversion was attempted 3 times but was unsuccessful.  Because of this the patient was recommended to be admitted to the hospital for management of A-fib with RVR and heart failure.  Pressures have been somewhat soft most recent BP at around 11 AM was 101/84. Stop metoprolol  succinate 100 mg daily.  Start metoprolol  tartrate 25 mg every 6 hours. May consider starting IV digoxin  to see if this helps with rate control. Continue mexiletine 200 mg twice daily Continue Eliquis  5 mg twice daily. Consult EP to see if they will consider inpatient ablation.   VT s/p Medtronic ICD Management per EP   Nonischemic cardiomyopathy Cardiac cath on 10/2015 showed normal coronary arteries.   Hypertension Management per heart failure above.   History of HIT Will avoid heparin .    Risk Assessment/Risk Scores:     New York  Heart Association (NYHA) Functional Class NYHA Class II  CHA2DS2-VASc Score = 3  1} This indicates a 3.2% annual risk of stroke. The patient's score is based upon: CHF History: 1 HTN History: 1 Diabetes History: 0 Stroke History: 0 Vascular Disease History: 0 Age Score: 1 Gender Score: 0    Code  Status: Full Code  Severity of Illness: The appropriate patient status for this patient is OBSERVATION. Observation status is judged to be reasonable and necessary in order to provide the required intensity of service to ensure the patient's safety. The patient's presenting symptoms, physical exam findings, and initial radiographic and laboratory data in the context of their medical condition is felt to place them at decreased risk for further clinical deterioration. Furthermore, it is anticipated that the patient will be medically stable for discharge from the hospital within 2 midnights of admission. For questions or updates, please contact  [facility] Please consult [email or web address] for contact info under      Signed, Morse Clause, PA-C  02/07/2024 12:51 PM

## 2024-02-07 NOTE — Progress Notes (Signed)
 Pt OOB to BR and HR up to 160's. Pt asymptomatic, HR back down to upper 90's to low100's, Scheduled Metoprolol  50mg  dose given per orders.

## 2024-02-07 NOTE — Interval H&P Note (Signed)
 History and Physical Interval Note:  02/07/2024 10:19 AM  Arthur Chase  has presented today for surgery, with the diagnosis of afib.  The various methods of treatment have been discussed with the patient and family. After consideration of risks, benefits and other options for treatment, the patient has consented to  Procedure(s): CARDIOVERSION (N/A) as a surgical intervention.  The patient's history has been reviewed, patient examined, no change in status, stable for surgery.  I have reviewed the patient's chart and labs.  Questions were answered to the patient's satisfaction.     Wilbert Bihari

## 2024-02-07 NOTE — Plan of Care (Signed)

## 2024-02-07 NOTE — Transfer of Care (Signed)
 Immediate Anesthesia Transfer of Care Note  Patient: Arthur Chase  Procedure(s) Performed: CARDIOVERSION  Patient Location: PACU  Anesthesia Type:MAC  Level of Consciousness: drowsy  Airway & Oxygen Therapy: Patient Spontanous Breathing and Patient connected to nasal cannula oxygen  Post-op Assessment: Report given to RN and Post -op Vital signs reviewed and stable  Post vital signs: Reviewed and stable  Last Vitals:  Vitals Value Taken Time  BP    Temp    Pulse    Resp    SpO2      Last Pain:  Vitals:   02/07/24 0958  TempSrc:   PainSc: 0-No pain         Complications: No notable events documented.

## 2024-02-07 NOTE — Anesthesia Preprocedure Evaluation (Signed)
 Anesthesia Evaluation  Patient identified by MRN, date of birth, ID band Patient awake    Reviewed: Allergy & Precautions, NPO status , Patient's Chart, lab work & pertinent test results  History of Anesthesia Complications Negative for: history of anesthetic complications  Airway Mallampati: III  TM Distance: >3 FB Neck ROM: Limited    Dental no notable dental hx. (+) Teeth Intact   Pulmonary former smoker H/o COVID in 09/2019   Pulmonary exam normal breath sounds clear to auscultation       Cardiovascular hypertension, Pt. on home beta blockers and Pt. on medications +CHF  + dysrhythmias Atrial Fibrillation + Cardiac Defibrillator (on warfarin (holding for 5 days per cards rec))  Rhythm:Irregular Rate:Normal  CV: Echo 10/08/19:  1. Left ventricular ejection fraction, by estimation, is 55 to 60%. The left ventricle has normal function. The left ventricle has no regional wall motion abnormalities. Left ventricular diastolic function could not be evaluated.   2. Right ventricular systolic function is normal. The right ventricular size is normal. There is normal pulmonary artery systolic pressure.   3. The mitral valve is abnormal. No evidence of mitral stenosis.   4. The aortic valve is tricuspid. Aortic valve regurgitation is not visualized. No aortic stenosis is present.   5. The inferior vena cava is normal in size with greater than 50% respiratory variability, suggesting right atrial pressure of 3 mmHg.   - Comparison(s): No significant change from prior study.  - Conclusion(s)/Recommendation(s): Normal biventricular function without evidence of hemodynamically significant valvular heart disease.      Neuro/Psych neg Seizures Anoxic encephalopathy from out of hospital arrest in 2017    GI/Hepatic Neg liver ROS,GERD  Medicated,,  Endo/Other  Hypothyroidism    Renal/GU negative Renal ROS     Musculoskeletal  (+)  Arthritis ,    Abdominal  (+) + obese  Peds  Hematology   Anesthesia Other Findings   Reproductive/Obstetrics                              Anesthesia Physical Anesthesia Plan  ASA: 3  Anesthesia Plan: General   Post-op Pain Management: Minimal or no pain anticipated   Induction: Intravenous  PONV Risk Score and Plan: 2 and Ondansetron , Dexamethasone  and Treatment may vary due to age or medical condition  Airway Management Planned:   Additional Equipment:   Intra-op Plan:   Post-operative Plan:   Informed Consent: I have reviewed the patients History and Physical, chart, labs and discussed the procedure including the risks, benefits and alternatives for the proposed anesthesia with the patient or authorized representative who has indicated his/her understanding and acceptance.     Dental advisory given  Plan Discussed with: CRNA  Anesthesia Plan Comments: ( )         Anesthesia Quick Evaluation

## 2024-02-08 DIAGNOSIS — I4891 Unspecified atrial fibrillation: Secondary | ICD-10-CM | POA: Diagnosis not present

## 2024-02-08 DIAGNOSIS — I4819 Other persistent atrial fibrillation: Secondary | ICD-10-CM | POA: Diagnosis not present

## 2024-02-08 LAB — BASIC METABOLIC PANEL WITH GFR
Anion gap: 10 (ref 5–15)
BUN: 12 mg/dL (ref 8–23)
CO2: 24 mmol/L (ref 22–32)
Calcium: 9.4 mg/dL (ref 8.9–10.3)
Chloride: 105 mmol/L (ref 98–111)
Creatinine, Ser: 1 mg/dL (ref 0.61–1.24)
GFR, Estimated: >60 mL/min (ref >60)
Glucose, Bld: 101 mg/dL — ABNORMAL HIGH (ref 70–99)
Potassium: 4.2 mmol/L (ref 3.5–5.1)
Sodium: 139 mmol/L (ref 135–145)

## 2024-02-08 LAB — CBC
HCT: 45.5 % (ref 39.0–52.0)
Hemoglobin: 15.6 g/dL (ref 13.0–17.0)
MCH: 32.2 pg (ref 26.0–34.0)
MCHC: 34.3 g/dL (ref 30.0–36.0)
MCV: 94 fL (ref 80.0–100.0)
Platelets: 186 K/uL (ref 150–400)
RBC: 4.84 MIL/uL (ref 4.22–5.81)
RDW: 12.8 % (ref 11.5–15.5)
WBC: 9.2 K/uL (ref 4.0–10.5)
nRBC: 0 % (ref 0.0–0.2)

## 2024-02-08 MED ORDER — METOPROLOL SUCCINATE ER 100 MG PO TB24
200.0000 mg | ORAL_TABLET | Freq: Every day | ORAL | Status: DC
  Administered 2024-02-08: 200 mg via ORAL
  Filled 2024-02-08: qty 2

## 2024-02-08 MED ORDER — EMPAGLIFLOZIN 10 MG PO TABS: 10.0000 mg | ORAL_TABLET | Freq: Every day | ORAL | 11 refills | Status: AC

## 2024-02-08 MED ORDER — METOPROLOL SUCCINATE ER 200 MG PO TB24: 200.0000 mg | ORAL_TABLET | Freq: Every day | ORAL | 11 refills | Status: AC

## 2024-02-08 MED ORDER — EMPAGLIFLOZIN 10 MG PO TABS
10.0000 mg | ORAL_TABLET | Freq: Every day | ORAL | Status: DC
  Administered 2024-02-08: 10 mg via ORAL
  Filled 2024-02-08: qty 1

## 2024-02-08 NOTE — TOC CM/SW Note (Signed)
 Transition of Care [facility]) - Inpatient Brief Assessment   Patient Details  Name: Arthur Chase MRN: [id number] Date of Birth: Aug 05, 1955  [facility] CM/SW Contact:    Sudie Erminio Deems, RN Phone Number: 02/08/2024, 10:34 AM   Clinical Narrative: Patient presented for evaluation of atrial fibrillation and heart failure. Post unsuccessful cardioversion. PTA patient was from home with support of sons. Plan will be to return home. Patient has insurance and PCP. No home needs identified during the visit. Family to provide transportation home via private vehicle.    Transition of Care Asessment: Insurance and Status: Insurance coverage has been reviewed Patient has primary care physician: Yes Home environment has been reviewed: reviewed Prior level of function:: independent Prior/Current Home Services: No current home services Social Drivers of Health Review: SDOH reviewed no interventions necessary Readmission risk has been reviewed: Yes Transition of care needs: no transition of care needs at this time

## 2024-02-08 NOTE — Progress Notes (Signed)
  Progress Note  Patient Name: Arthur Chase Date of Encounter: 02/08/2024 [facility] Cardiologist: Ozell Fell, MD   Interval Summary   No events overnight. This morning, he denies any symptoms.  Vital Signs Vitals:   02/07/24 2003 02/07/24 2231 02/08/24 0304 02/08/24 0819  BP: 125/77 112/74 107/82 116/80  Pulse:    (!) 47  Resp:   15 16  Temp:   98 F (36.7 C) 97.6 F (36.4 C)  TempSrc:   Oral Oral  SpO2:   96% 95%  Weight:      Height:        Intake/Output Summary (Last 24 hours) at 02/08/2024 0847 Last data filed at 02/07/2024 1904 Gross per 24 hour  Intake 320 ml  Output --  Net 320 ml      02/07/2024    9:47 AM 02/06/2024    8:23 AM 01/18/2024    4:22 PM  Last 3 Weights  Weight (lbs) 240 lb 240 lb 3.2 oz 243 lb  Weight (kg) 108.863 kg 108.954 kg 110.224 kg      Telemetry/ECG  Rate controlled Afib in 70s - Personally Reviewed  Physical Exam  GEN: No acute distress.   Neck: No JVD Cardiac: irregularly irregular, no murmurs, rubs, or gallops.  Respiratory: Clear to auscultation bilaterally. GI: Soft, nontender, non-distended  MS: No edema  Assessment & Plan  Mr Mia is a 56 yoM with Hx VT/VF arrest (2017) s/p ICD w/ ICD shocks now on mexilitine, Afib s/p DCCV x 3 (09/2023), and new onset HFrEF (30% 11/25) presenting for Afib with RVR and failed DCCV x 3. Has been symptomatic with worsening fatigue. Discussed case with Dr. Inocencio and plan to increase metop XL to 200 mg daily. He is to get scheduled for PVI in the near future.   #Afib with RVR #HFrEF 30% 11/25 #VT/VF s/p ICD c/b ICD shocks on Mexilitine - Increased metop XL to 200 daily - Cont Eliquis  and Mexilitine - Cont Entresto 25-26, spiro 25, and start Empagliflozin 10 mg daily - Can d/c today with plan for outpatient PVI   For questions or updates, please contact [facility] Please consult [email or web address] for contact info under   Signed, Joelle VEAR Ren Donley, MD

## 2024-02-08 NOTE — Discharge Summary (Signed)
 Discharge Summary   Patient ID: Arthur Chase MRN: [id number]; DOB: 11-27-1955  Admit date: 02/07/2024 Discharge date: 02/08/2024  PCP:  Chrystal Lamarr RAMAN, MD   [facility] Providers Cardiologist:  Ozell Fell, MD  Electrophysiologist:  Soyla Gladis Norton, MD      Discharge Diagnoses  Principal Problem:   Atrial fibrillation with RVR [facility]) Active Problems:   Atrial fibrillation [facility])   Diagnostic Studies/Procedures  None _____________   History of Present Illness   Arthur Chase is a 68 y.o. male with a past medical history of persistent atrial fibrillation on Eliquis , hypertension, hyperlipidemia, HIT, nonischemic cardiomyopathy, HFimpEF, VT s/p ICD, Hx of OOH, and cardiac arrest.  The patient has the prior cardiac history listed below.  On 10/2015 the patient was hospitalized following a V-fib cardiac arrest.  He underwent an emergent cardiac catheterization that showed no CAD.  Echocardiogram showed a reduced LVEF less than 20%.  A cardiac MRI was done and showed moderate LVH, a normal LVEF of 68%, no RWMA, normal RV function and size, no LV scar or infiltration, and no delayed gadolinium uptake.  During this hospitalization the patient was also diagnosed with new onset atrial fibrillation.  Because of the VT the patient was started on amiodarone  and received a Medtronic ICD.  The patient did receive some shocks from his ICD and was started on mexiletine.  On 09/2019 the patient was hospitalized for COVID-pneumonia and developed A-fib with RVR.  Echo during this hospitalization showed the patient's LVEF had improved to 55 to 60%.  He was initially anticoagulated with warfarin but in 2025 was transition to Eliquis .  On 09/2023 the patient had a cardioversion for A-fib with RVR.  The cardioversion was only successful on the third attempt.  This cardioversion the patient went back into A-fib with RVR.  The device interrogation showed that the A-fib with RVR was  poorly rate controlled.  The patient also reported worsening dyspnea on exertion.  On 01/24/2024 an echocardiogram was done and showed a reduced LVEF of 30%, and global hypokinesis.  It was suspected that the A-fib with RVR was likely the cause of the heart failure.  The patient was seen by the A-fib clinic.  At that appointment the patient was recommended to get a cardioversion and an ablation.  The patient presented to the hospital on 02/07/2024 for the cardioversion.  The cardioversion was attempted 3 times and was unsuccessful in converting the patient to sinus rhythm.  Because of this the patient was admitted to the hospital to improve his rate control.   Hospital Course   Consultants: None  Persistent A-fib with RVR Hypertension The patient was hospitalized following an unsuccessful cardioversion as mentioned above.  Labs were ordered including a CBC and CMP.  The only thing outside of reference range on the labs was the patient's glucose was mildly elevated.  The patient was started on metoprolol  tartrate 50 mg every 6 hours.  Today the metoprolol  tartrate was stopped and the patient was started on metoprolol  succinate 200 mg daily to help control the patient's heart rates after discharge.  Telemetry showed that the patient's heart rates had improved to the 60s and 70s.  Blood pressures have been stable most recent BP was 116/80.  The patient was seen by Dr. Joelle and felt to be stable for discharge. Continue Eliquis  5 mg twice daily (correct dose) Continue metoprolol  succinate 200 mg daily (increased dose this hospitalization) Continue mexiletine 200 mg twice daily. Plan for outpatient  follow-up with EP to discuss ablation. 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  It was previously suspected that  this heart failure was secondary to the patient's A-fib with RVR. Labs were ordered and showed normal BNP of 53.3. Chest x-ray showed no active disease. GDMT Continue metoprolol  succinate 200 mg daily (increased dose this hospitalization) Continue Entresto 24-26 twice daily Continue Jardiance 10 mg daily (started this hospitalization) Continue Aldactone  25 mg daily   Hyperlipidemia Continue atorvastatin 10 mg daily   VT s/p Medtronic ICD Management per EP     Hypertension Management per heart failure above.     History of HIT Will avoid heparin    Hypothyroidism Continue levothyroxine  75 mcg daily.   GERD Continue Protonix  40 mg daily      Did the patient have an acute coronary syndrome (MI, NSTEMI, STEMI, etc) this admission?:  No                               Did the patient have a percutaneous coronary intervention (stent / angioplasty)?:  No.        The patient will be scheduled for a TOC follow up appointment in 14 days.  A message has been sent to the [facility] and Scheduling Pool at the office where the patient should be seen for follow up.  _____________  Discharge Vitals Blood pressure 116/80, pulse (!) 47, temperature 97.6 F (36.4 C), temperature source Oral, resp. rate 16, height 5' 11 (1.803 m), weight 108.9 kg, SpO2 95%.  Filed Weights   02/07/24 0947  Weight: 108.9 kg    Labs & Radiologic Studies  CBC Recent Labs    02/07/24 1545 02/08/24 0415  WBC 7.3 9.2  NEUTROABS 3.7  --   HGB 16.0 15.6  HCT 46.4 45.5  MCV 92.6 94.0  PLT 200 186   Basic Metabolic Panel Recent Labs    88/81/74 1545 02/08/24 0415  NA 138 139  K 3.9 4.2  CL 104 105  CO2 22 24  GLUCOSE 133* 101*  BUN 12 12  CREATININE 0.90 1.00  CALCIUM 9.5 9.4  MG 2.2  --    Liver Function Tests Recent Labs    02/07/24 1545  AST 23  ALT 17  ALKPHOS 54  BILITOT 0.9  PROT 7.0  ALBUMIN 4.0   No results for input(s): LIPASE, AMYLASE in the last 72 hours. High  Sensitivity Troponin:   No results for input(s): TROPONINIHS in the last 720 hours.  No results for input(s): TRNPT in the last 720 hours.  BNP Invalid input(s): POCBNP No results for input(s): PROBNP in the last 72 hours.  Recent Labs    02/07/24 1545  BNP 53.3    D-Dimer No results for input(s): DDIMER in the last 72 hours. Hemoglobin A1C Recent Labs    02/07/24 1545  HGBA1C 5.4   Fasting Lipid Panel No results for input(s): CHOL, HDL, LDLCALC, TRIG, CHOLHDL, LDLDIRECT in the last 72 hours. No results found for: LIPOA  Thyroid Function Tests No results for input(s): TSH, T4TOTAL, T3FREE, THYROIDAB in the last 72 hours.  Invalid input(s): FREET3 _____________  DG CHEST PORT 1 VIEW Result Date: 02/07/2024 CLINICAL DATA:  Heart failure EXAM: PORTABLE CHEST 1 VIEW COMPARISON:  Chest x-ray  10/06/2019 FINDINGS: Left-sided pacemaker is again seen. The cardiomediastinal silhouette is within normal limits. The lungs are clear. There is no pleural effusion or pneumothorax. Cervical spinal fusion plate is again noted. IMPRESSION: No active disease. Electronically Signed   By: Greig Pique M.D.   On: 02/07/2024 22:38   EP STUDY Result Date: 02/07/2024 See surgical note for result.  CUP PACEART REMOTE DEVICE CHECK Result Date: 02/01/2024 ICD: Scheduled remote reviewed. Normal device function.  Presenting rhythm: irregular VS 139 NSVT, irregular and most likely AF with rapid ventricular response 62 AF events, longest duration >99 hrs, not always good rate control, burden 66.7%, Eliquis  per EPIC Next remote transmission per protocol. ML, CVRS ICD: Scheduled remote reviewed. Normal device function.  Presenting rhythm: irregular VS 139 NSVT, irregular and most likely AF with rapid ventricular response 62 AF events, longest duration >99 hrs, not always good rate control, burden 66.7%, Eliquis  per EPIC Sent to triage due to increased AF burden and AF w/RVR Next  remote transmission per protocol. ML, CVRS  ECHOCARDIOGRAM COMPLETE Result Date: 01/24/2024    ECHOCARDIOGRAM REPORT   Patient Name:   Arthur Chase Date of Exam: 01/24/2024 Medical Rec #:  [id number]        Height:       71.0 in Accession #:    [id number]       Weight:       243.0 lb Date of Birth:  1955/09/27         BSA:          2.291 m Patient Age:    68 years         BP:           120/87 mmHg Patient Gender: M                HR:           100 bpm. Exam Location:  [address] Procedure: 2D Echo, Color Doppler, Cardiac Doppler and Intracardiac            Opacification Agent (Both Spectral and Color Flow Doppler were            utilized during procedure). Indications:    I48.0 Paroxysmal Atrial Fibrillation  History:        Patient has prior history of Echocardiogram examinations, most                 recent 10/08/2019. Defibrillator and Pacemaker, Arrythmias:Atrial                 Fibrillation; Risk Factors:Hypertension.  Sonographer:    Powell Saras Referring Phys: [phone] MICHAEL COOPER IMPRESSIONS  1. Left ventricular ejection fraction, by estimation, is 30%. The left ventricle has moderate to severely decreased function. The left ventricle demonstrates global hypokinesis. There is moderate asymmetric left ventricular hypertrophy of the basal-septal segment. Left ventricular diastolic parameters are indeterminate. No LV thrombus.  2. Right ventricular systolic function is mildly reduced. The right ventricular size is mildly enlarged. There is mildly elevated pulmonary artery systolic pressure. The estimated right ventricular systolic pressure is 36.9 mmHg.  3. Left atrial size was moderately dilated.  4. Right atrial size was moderately dilated.  5. The mitral valve is normal in structure. Trivial mitral valve regurgitation. No evidence of mitral stenosis.  6. The aortic valve is tricuspid. There is mild calcification of the aortic valve. Aortic valve regurgitation is not visualized. No aortic stenosis is  present.  7. Aortic dilatation noted. There is  mild dilatation of the aortic root, measuring 41 mm.  8. The inferior vena cava is dilated in size with <50% respiratory variability, suggesting right atrial pressure of 15 mmHg.  9. The patient was in atrial fibrillation. FINDINGS  Left Ventricle: Left ventricular ejection fraction, by estimation, is 30%. The left ventricle has moderate to severely decreased function. The left ventricle demonstrates global hypokinesis. Definity  contrast agent was given IV to delineate the left ventricular endocardial borders. The left ventricular internal cavity size was normal in size. There is moderate asymmetric left ventricular hypertrophy of the basal-septal segment. Left ventricular diastolic parameters are indeterminate. Right Ventricle: The right ventricular size is mildly enlarged. No increase in right ventricular wall thickness. Right ventricular systolic function is mildly reduced. There is mildly elevated pulmonary artery systolic pressure. The tricuspid regurgitant  velocity is 2.34 m/s, and with an assumed right atrial pressure of 15 mmHg, the estimated right ventricular systolic pressure is 36.9 mmHg. Left Atrium: Left atrial size was moderately dilated. Right Atrium: Right atrial size was moderately dilated. Pericardium: There is no evidence of pericardial effusion. Mitral Valve: The mitral valve is normal in structure. There is mild thickening of the mitral valve leaflet(s). There is mild calcification of the mitral valve leaflet(s). Trivial mitral valve regurgitation. No evidence of mitral valve stenosis. Tricuspid Valve: The tricuspid valve is normal in structure. Tricuspid valve regurgitation is trivial. Aortic Valve: The aortic valve is tricuspid. There is mild calcification of the aortic valve. Aortic valve regurgitation is not visualized. No aortic stenosis is present. Pulmonic Valve: The pulmonic valve was normal in structure. Pulmonic valve regurgitation is not  visualized. Aorta: Aortic dilatation noted. There is mild dilatation of the aortic root, measuring 41 mm. Venous: The inferior vena cava is dilated in size with less than 50% respiratory variability, suggesting right atrial pressure of 15 mmHg. IAS/Shunts: No atrial level shunt detected by color flow Doppler. Additional Comments: A device lead is visualized in the right ventricle.  LEFT VENTRICLE PLAX 2D LVIDd:         4.55 cm LVIDs:         3.61 cm LV PW:         0.99 cm LV IVS:        1.66 cm LVOT diam:     2.50 cm LV SV:         45 LV SV Index:   20 LVOT Area:     4.91 cm  LV Volumes (MOD) LV vol d, MOD A2C: 141.0 ml LV vol d, MOD A4C: 141.0 ml LV vol s, MOD A2C: 91.8 ml LV vol s, MOD A4C: 80.5 ml LV SV MOD A2C:     49.2 ml LV SV MOD A4C:     141.0 ml LV SV MOD BP:      62.1 ml RIGHT VENTRICLE            IVC RV Basal diam:  4.66 cm    IVC diam: 2.31 cm RV Mid diam:    3.59 cm RV S prime:     8.70 cm/s TAPSE (M-mode): 1.5 cm RVSP:           24.9 mmHg LEFT ATRIUM              Index        RIGHT ATRIUM           Index LA diam:        5.40 cm  2.36 cm/m   RA Pressure: 3.00  mmHg LA Vol (A2C):   109.0 ml 47.59 ml/m  RA Area:     26.80 cm LA Vol (A4C):   106.0 ml 46.28 ml/m  RA Volume:   93.20 ml  40.69 ml/m LA Biplane Vol: 117.0 ml 51.08 ml/m  AORTIC VALVE LVOT Vmax:   55.47 cm/s LVOT Vmean:  37.333 cm/s LVOT VTI:    0.092 m  AORTA Ao Root diam: 4.10 cm Ao Asc diam:  3.80 cm TRICUSPID VALVE TR Peak grad:   21.9 mmHg TR Vmax:        234.00 cm/s Estimated RAP:  3.00 mmHg RVSP:           24.9 mmHg  SHUNTS Systemic VTI:  0.09 m Systemic Diam: 2.50 cm Dalton McleanMD Electronically signed by Ezra Kanner Signature Date/Time: 01/24/2024/4:33:03 PM    Final     Disposition Pt is being discharged home today in good condition.  Follow-up Plans & Appointments    Discharge Medications Allergies as of 02/08/2024       Reactions   Amoxicillin    headaches   Heparin  Other (See Comments)   Thrombocytopenia.  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   apixaban  5 MG Tabs tablet Commonly known as: ELIQUIS  Take 1 tablet (5 mg total) by mouth 2 (two) times daily.   atorvastatin 10 MG tablet Commonly known as: LIPITOR Take 10 mg by mouth daily.   empagliflozin 10 MG Tabs tablet Commonly known as: JARDIANCE Take 1 tablet (10 mg total) by mouth daily. Start taking on: February 09, 2024   fluticasone 50 MCG/ACT nasal spray Commonly known as: FLONASE Place 1 spray into both nostrils in the morning.   levothyroxine  75 MCG tablet Commonly known as: SYNTHROID  Take 75 mcg by mouth daily before breakfast.   metoprolol  200 MG 24 hr tablet Commonly known as: TOPROL -XL Take 1 tablet (200 mg total) by mouth daily. Take with or immediately following a meal. Start taking on: February 09, 2024 What changed:  medication strength how much to take additional instructions   mexiletine 200 MG capsule Commonly known as: MEXITIL  TAKE 1 CAPSULE BY MOUTH TWICE A DAY   multivitamin with minerals Tabs tablet Take 1 tablet by mouth daily. Centrum Silver   pantoprazole  40 MG tablet Commonly known as: PROTONIX  TAKE 1 TABLET BY MOUTH EVERY DAY   sacubitril-valsartan 24-26 MG Commonly known as: ENTRESTO Take 1 tablet by mouth 2 (two) times daily.   spironolactone  25 MG tablet Commonly known as: ALDACTONE  Take 1 tablet (25 mg total) by mouth daily. Please call [phone] to schedule an overdue appointment with Dr. Wonda for future refills. Thank you. 2nd attempt.         Outstanding  Labs/Studies   Duration of Discharge Encounter: APP Time: 20 minutes   Signed, Morse Clause, PA-C 02/08/2024, 1:18 PM

## 2024-02-08 NOTE — Care Management CC44 (Signed)
 Condition Code 44 Documentation Completed  Patient Details  Name: Arthur Chase MRN: [id number] Date of Birth: [birth year]   Condition Code 44 given:  Yes Patient signature on Condition Code 44 notice:  Yes Documentation of 2 MD's agreement:  Yes Code 44 added to claim:  Yes    Sudie Erminio Deems, RN 02/08/2024, 10:32 AM

## 2024-02-08 NOTE — Care Management Obs Status (Signed)
 MEDICARE OBSERVATION STATUS NOTIFICATION   Patient Details  Name: Arthur Chase MRN: [id number] Date of Birth: 1955/11/30   Medicare Observation Status Notification Given:  Yes    Sudie Erminio Deems, RN 02/08/2024, 10:32 AM

## 2024-02-09 ENCOUNTER — Telehealth (HOSPITAL_BASED_OUTPATIENT_CLINIC_OR_DEPARTMENT_OTHER): Payer: Self-pay

## 2024-02-09 NOTE — Telephone Encounter (Signed)
 Patient contacted by clinical team regarding discharge from hospital on 02/08/24  Patient understands to follow up with provider Fairy Heinrich on 02/22/24 at 9 am  at A-fib clinic at [facility]. Patient understands discharge instructions? yes Patient understands medications and how to take them? yes Patient understands to bring all medications to this visit? yes  You can review AVS Prior to calling to determine if patient had orders for Home Health or Medical Equipment (DME). Patient reports appropriate help at home with ADL's and has DME at this time. NA Patient reports they have been contacted by home health if ordered.  NA   You can see if the patient is active in MyChart next to their picture.  If they are please remind them that they can do early check in for their visit a day or two before to make the day of their visit run as smooth as possible.  If they need help with MyChart you can refer them to their AVS for user name and password and give them the phone number [phone] to call for help.    Patient states they use MyChart and will complete the online check in.    Postop: Structural/Cath/Device/(other cardiac invasive procedures) patients:   What is your wound status? Any signs/ symptoms of infection (Temp, redness/ red streaks, swelling, purulent drainage, foul odor or smell)? NA   Please do not place any creams/ lotions/ or antibiotic ointment on any surgical incisions/ wounds without physician approval.  NA

## 2024-02-19 DIAGNOSIS — I5032 Chronic diastolic (congestive) heart failure: Secondary | ICD-10-CM | POA: Diagnosis not present

## 2024-02-19 DIAGNOSIS — E782 Mixed hyperlipidemia: Secondary | ICD-10-CM | POA: Diagnosis not present

## 2024-02-19 DIAGNOSIS — I428 Other cardiomyopathies: Secondary | ICD-10-CM | POA: Diagnosis not present

## 2024-02-19 DIAGNOSIS — I480 Paroxysmal atrial fibrillation: Secondary | ICD-10-CM | POA: Diagnosis not present

## 2024-02-19 DIAGNOSIS — E032 Hypothyroidism due to medicaments and other exogenous substances: Secondary | ICD-10-CM | POA: Diagnosis not present

## 2024-02-19 DIAGNOSIS — I10 Essential (primary) hypertension: Secondary | ICD-10-CM | POA: Diagnosis not present

## 2024-02-22 ENCOUNTER — Encounter (HOSPITAL_COMMUNITY): Payer: Self-pay | Admitting: Internal Medicine

## 2024-02-22 ENCOUNTER — Other Ambulatory Visit: Payer: Self-pay | Admitting: Cardiology

## 2024-02-22 ENCOUNTER — Ambulatory Visit (HOSPITAL_COMMUNITY)
Admission: RE | Admit: 2024-02-22 | Discharge: 2024-02-22 | Disposition: A | Discharge status: Home / Self Care | Payer: Medicare (Managed Care) | Source: Ambulatory Visit | Attending: Internal Medicine | Admitting: Internal Medicine

## 2024-02-22 VITALS — BP 98/66 | HR 99 | Ht 71.0 in | Wt 240.2 lb

## 2024-02-22 DIAGNOSIS — D6869 Other thrombophilia: Secondary | ICD-10-CM

## 2024-02-22 DIAGNOSIS — I4819 Other persistent atrial fibrillation: Secondary | ICD-10-CM | POA: Diagnosis not present

## 2024-02-22 DIAGNOSIS — I5020 Unspecified systolic (congestive) heart failure: Secondary | ICD-10-CM | POA: Diagnosis not present

## 2024-02-22 DIAGNOSIS — I480 Paroxysmal atrial fibrillation: Secondary | ICD-10-CM

## 2024-02-22 DIAGNOSIS — R9431 Abnormal electrocardiogram [ECG] [EKG]: Secondary | ICD-10-CM | POA: Diagnosis not present

## 2024-02-22 DIAGNOSIS — I10 Essential (primary) hypertension: Secondary | ICD-10-CM

## 2024-02-22 DIAGNOSIS — I472 Ventricular tachycardia, unspecified: Secondary | ICD-10-CM | POA: Diagnosis not present

## 2024-02-22 NOTE — Progress Notes (Signed)
 Primary Care Physician: Arthur Chase Lamarr RAMAN, MD Primary Cardiologist: Arthur Fell, MD Electrophysiologist: Arthur Gladis Norton, MD  Referring Physician: Dr. Norton [name] Chase is a 68 y.o. male with a history of obesity, HTN, OOH cardiac arrest of unclear etiology 10/2015 c/b anoxic encephalopathy, cardiogenic shock, VDRF, acute systolic CHF/NICM (EF as low as 20% during 10/2015 hospitalization, up to 68% by cMRI 11/26/15), VF/NSVT, heparin -induced thrombocytopenia (on Coumadin  for this + afib), hypokalemia, and paroxysmal afib who presents for follow-up in the [facility]. He was admitted 11/16/15 with cardiac arrest and EMS strips demonstrating multiple episodes of VT/VF with a cycle length ~275msec. He was successfully rescuscitated - underwent a prolonged hospital admission s/p cooling. Underwent ICD implantation with a Medtronic ICD on 12/01/15. Was put on amiodarone . Had received ICD shocks in the past.  He was thus started on mexiletine. He was hospitalized on 10/06/19 with COVID pneumonia and developed afib with RVR during admission. He is s/p DCCV on 09/27/23 and recent unsuccessful DCCV attempted on 02/07/2024 with subsequent admission for rate control.  Started on metoprolol  succinate 200 mg and achieved improved heart rate in the 60s and 70s.  He was noted to have stable blood pressure with increased dose of beta-blocker.  He presents today to discuss further treatment with possible ablation and referral to EP to discuss pulsed field ablation.  Arthur Chase Chase presents today for follow-up and is currently in rate controlled A-fib and doing well with his increased dose of beta-blocker.  He is still able to stay active and walks his dog daily but does note some shortness of breath with elevated exertion.  He reports that his blood pressures have primarily been in the low 100s (systolic) since his discharge from the hospital.  He denies any episodes of rapid heart  rate during these activities.  We discussed ED precautions for elevated heart rate that is not relieved with rest.  He denies any missed doses of his anticoagulation and has been compliant with his additional medications.  He was advised to monitor his blood pressure in the evenings and to also monitor his heart rate.  Today, he denies symptoms of palpitations, chest pain, shortness of breath, orthopnea, PND, lower extremity edema, dizziness, presyncope, syncope, snoring, daytime somnolence, bleeding, or neurologic sequela. The patient is tolerating medications without difficulties and is otherwise without complaint today.    Atrial Fibrillation Risk Factors: he does not have symptoms or diagnosis of sleep apnea. he does not have a history of rheumatic fever.  Atrial Fibrillation Management history:  Previous antiarrhythmic drugs: Amiodarone  (Stopped Due to hypothyroidism) and Mexiletin Previous cardioversions: 09/27/23 (Successful) and unsuccessful attempt on 02/07/2024 Previous ablations: None Anticoagulation history: Coumadin , Eliquis   ROS- All systems are reviewed and negative except as per the HPI above.  Past Medical History:  Diagnosis Date   AICD (automatic cardioverter/defibrillator) present    Anoxic encephalopathy (HCC) 10/2015   a. 10/2015 in setting of cardiac arrest - recovered prior to DC.   Cardiac arrest with ventricular fibrillation (HCC)    a. 11/2015: Unclear etiology. Nl cors on cath 8/27. S/P cooling. On amiodarone  and s/p ICD for secondary prevention on 12/01/15. 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 11/2015 during admission for VF arrest. HIT panel positive. Placed on coumadin  for 2 months   HTN (hypertension)    Hypokalemia    a. 11/2015:  resolved on K supplementation and spironolactone    Hypothyroidism    ICD (implantable cardioverter-defibrillator) in place    a.  Medtronic Visia AF MRI (serial  Number [id number]) ICD.   Myocardial stunning    a. 11/2015: EF down to 20-25% after VF arrest but improved to ~68 by subsequent cardiac MR.   NICM (nonischemic cardiomyopathy) (HCC)    Obesity    PAF (paroxysmal atrial fibrillation) (HCC)    a. 11/2015 brief run of afib with RVR while intubated during a complicated admission for VF arrest and VDRF. No recurrence on amio. CHADSVASC 2 (CHF, HTN) and on coumadin  for HIT with positive thrombotic markers. If no long term recurrence, he may be able to come off [facility]   Ventricular tachycardia (HCC) 10/2015    Current Outpatient Medications  Medication Sig Dispense Refill   albuterol  (VENTOLIN  HFA) 108 (90 Base) MCG/ACT inhaler Inhale 2 puffs into the lungs every 6 (six) hours as needed for wheezing or shortness of breath. 6.7 g 0   atorvastatin  (LIPITOR) 10 MG tablet Take 10 mg by mouth daily.     ELIQUIS  5 MG TABS tablet TAKE 1 TABLET BY MOUTH TWICE A DAY 180 tablet 1   empagliflozin  (JARDIANCE ) 10 MG TABS tablet Take 1 tablet (10 mg total) by mouth daily. 30 tablet 11   fluticasone  (FLONASE ) 50 MCG/ACT nasal spray Place 1 spray into both nostrils in the morning.     levothyroxine  (SYNTHROID ) 75 MCG tablet Take 75 mcg by mouth daily before breakfast.      metoprolol  succinate (TOPROL -XL) 200 MG 24 hr tablet Take 1 tablet (200 mg total) by mouth daily. Take with or immediately following a meal. 30 tablet 11   mexiletine (MEXITIL ) 200 MG capsule TAKE 1 CAPSULE BY MOUTH TWICE A DAY 180 capsule 3   Multiple Vitamin (MULTIVITAMIN WITH MINERALS) TABS tablet Take 1 tablet by mouth daily. Centrum Silver     pantoprazole  (PROTONIX ) 40 MG tablet TAKE 1 TABLET BY MOUTH EVERY DAY 90 tablet 2   sacubitril -valsartan  (ENTRESTO ) 24-26 MG Take 1 tablet by mouth 2 (two) times daily. 60 tablet 3   spironolactone  (ALDACTONE ) 25 MG tablet Take 1 tablet (25 mg total) by mouth daily. Please call [phone] to schedule an overdue appointment  with Dr. Wonda for future refills. Thank you. 2nd attempt. 15 tablet 0   No current facility-administered medications for this encounter.    Physical Exam: BP 98/66   Pulse 99   Ht 5' 11 (1.803 m)   Wt 109 kg   BMI 33.50 kg/m   GEN: Well nourished, well developed in no acute distress NECK: No JVD; No carotid bruits CARDIAC: Irregularly irregular rate and rhythm, no murmurs, rubs, gallops RESPIRATORY:  Clear to auscultation without rales, wheezing or rhonchi  ABDOMEN: Soft, non-tender, non-distended EXTREMITIES: Trace bilateral lower extremity edema  Wt Readings from Last 3 Encounters:  02/22/24 109 kg  02/07/24 108.9 kg  02/06/24 109 kg     EKG today demonstrates:   EKG Interpretation Date/Time:  Wednesday February 22 2024 08:46:34 EST Ventricular Rate:  99 PR Interval:    QRS Duration:  114 QT Interval:  358 QTC Calculation: 459 R Axis:   -37  Text Interpretation: Atrial fibrillation Left axis deviation Abnormal ECG Confirmed by Wyn Manus [phone]) on 02/22/2024 9:26:51 AM        Echo on 01/2024 demonstrated: Left ventricular ejection fraction, by estimation, is 30%. The left  ventricle has moderate to severely decreased function. The left  ventricle  demonstrates global hypokinesis. There is moderate asymmetric left  ventricular hypertrophy of the  basal-septal segment. Left ventricular diastolic parameters are  indeterminate. No LV thrombus.   2. Right ventricular systolic function is mildly reduced. The right  ventricular size is mildly enlarged. There is mildly elevated pulmonary  artery systolic pressure. The estimated right ventricular systolic  pressure is 36.9 mmHg.   3. Left atrial size was moderately dilated.   4. Right atrial size was moderately dilated.   5. The mitral valve is normal in structure. Trivial mitral valve  regurgitation. No evidence of mitral stenosis.   6. The aortic valve is tricuspid. There is mild calcification of the  aortic  valve. Aortic valve regurgitation is not visualized. No aortic  stenosis is present.   7. Aortic dilatation noted. There is mild dilatation of the aortic root,  measuring 41 mm.   8. The inferior vena cava is dilated in size with <50% respiratory  variability, suggesting right atrial pressure of 15 mmHg.   9. The patient was in atrial fibrillation.    CHA2DS2-VASc Score = 3  The patient's score is based upon: CHF History: 1 HTN History: 1 Diabetes History: 0 Stroke History: 0 Vascular Disease History: 0 Age Score: 1 Gender Score: 0       ASSESSMENT AND PLAN: Persistent Atrial Fibrillation (ICD10:  I48.19) The patient's CHA2DS2-VASc score is 3, indicating a 3.2% annual risk of stroke.  -s/p unsuccessful DCCV with plan to discuss pulsed field ablation with Dr. Inocencio in February 2026. -He is maintaining rate control with increased dose of Toprol -XL and denies any significant adverse effects or low blood pressures. -He was advised to contact our office if rate becomes elevated prior to follow-up in February with plan to discuss possible rhythm control with digoxin  with Dr. Inocencio at that time. -Continue Toprol -XL 200 mg daily - Continue Eliquis  5 mg twice daily  High risk medication monitoring (ICD10: Z79.899) Patient requires ongoing monitoring for anti-arrhythmic medication which has the potential to cause life threatening arrhythmias or AV block. Qtc stable. Continue mexiletine 200 mg BID.   Secondary Hypercoagulable State (ICD10:  D68.69) The patient is at significant risk for stroke/thromboembolism based upon his CHA2DS2-VASc Score of 3.  Continue Apixaban  (Eliquis ).   HFrEF:  -echocardiogram on 01/24/2024 showed a reduced LVEF of 30% with global hypokinesis, and asymmetric LVH of the basal septal segment.  It was previously suspected that this heart failure was secondary to arrhythmia.   VT s/p Medtronic ICD: - Stable Paceart report follow-up with EP as  scheduled  HTN: Stable on current regimen  Signed,  Terra Pac, PA-C    02/22/2024 11:21 AM     Follow up as scheduled  Jackee Alberts, NP-C [facility] [address] [place], [place] [address] [phone]

## 2024-02-22 NOTE — Telephone Encounter (Signed)
 Prescription refill request for Eliquis  received. Indication:afib Last office visit:11/25 Scr: 1.00  11/25 Age:68 Weight:108.9  kg  Prescription refilled

## 2024-03-08 ENCOUNTER — Telehealth: Payer: Self-pay | Admitting: *Deleted

## 2024-03-08 DIAGNOSIS — Z01812 Encounter for preprocedural laboratory examination: Secondary | ICD-10-CM

## 2024-03-08 DIAGNOSIS — I4819 Other persistent atrial fibrillation: Secondary | ICD-10-CM

## 2024-03-08 NOTE — Telephone Encounter (Signed)
 Scheduled afib ablation for 05/08/24.  Aware will see him on 04/05/24 OV and give him instructions, at a later date,  if proceeding.  Pt agreeable to plan.

## 2024-03-19 LAB — CBC

## 2024-03-20 LAB — BASIC METABOLIC PANEL WITH GFR
BUN/Creatinine Ratio: 15 (ref 10–24)
BUN: 15 mg/dL (ref 8–27)
CO2: 17 mmol/L — AB (ref 20–29)
Calcium: 9.7 mg/dL (ref 8.6–10.2)
Chloride: 106 mmol/L (ref 96–106)
Creatinine, Ser: 1.02 mg/dL (ref 0.76–1.27)
Glucose: 101 mg/dL — AB (ref 70–99)
Potassium: 4.3 mmol/L (ref 3.5–5.2)
Sodium: 139 mmol/L (ref 134–144)
eGFR: 80 mL/min/1.73

## 2024-03-20 LAB — CBC
Hematocrit: 49.6 % (ref 37.5–51.0)
Hemoglobin: 16.6 g/dL (ref 13.0–17.7)
MCH: 31.7 pg (ref 26.6–33.0)
MCHC: 33.5 g/dL (ref 31.5–35.7)
MCV: 95 fL (ref 79–97)
Platelets: 235 x10E3/uL (ref 150–450)
RBC: 5.23 x10E6/uL (ref 4.14–5.80)
RDW: 12.1 % (ref 11.6–15.4)
WBC: 9.2 x10E3/uL (ref 3.4–10.8)

## 2024-04-05 ENCOUNTER — Ambulatory Visit: Payer: Medicare (Managed Care) | Attending: Cardiology | Admitting: Cardiology

## 2024-04-05 ENCOUNTER — Encounter: Payer: Self-pay | Admitting: Cardiology

## 2024-04-05 VITALS — BP 118/80 | HR 76 | Ht 71.0 in | Wt 230.0 lb

## 2024-04-05 DIAGNOSIS — I5022 Chronic systolic (congestive) heart failure: Secondary | ICD-10-CM | POA: Diagnosis not present

## 2024-04-05 DIAGNOSIS — I428 Other cardiomyopathies: Secondary | ICD-10-CM

## 2024-04-05 DIAGNOSIS — I4819 Other persistent atrial fibrillation: Secondary | ICD-10-CM | POA: Diagnosis not present

## 2024-04-05 DIAGNOSIS — D6869 Other thrombophilia: Secondary | ICD-10-CM | POA: Diagnosis not present

## 2024-04-05 DIAGNOSIS — I472 Ventricular tachycardia, unspecified: Secondary | ICD-10-CM

## 2024-04-05 NOTE — Patient Instructions (Signed)
 The office will send your procedure instructions via mychart this week/next week.   They will let you know when to get your blood work as well.

## 2024-04-05 NOTE — Progress Notes (Signed)
 " Electrophysiology Office Note:   Date:  04/05/2024  ID:  KAMIL MCHAFFIE, DOB 11-23-1955, MRN [id number]  Primary Cardiologist: Ozell Fell, MD Primary Heart Failure: None Electrophysiologist: Anzel Kearse Gladis Norton, MD      History of Present Illness:   Arthur Chase is a 69 y.o. male with h/o chronic systolic heart failure, ventricular tachycardia, atrial fibrillation seen today for routine electrophysiology followup.   Discussed the use of AI scribe software for clinical note transcription with the patient, who gave verbal consent to proceed.  History of Present Illness Arthur Chase is a 69 year old male with atrial fibrillation who presents for evaluation and management of his condition.  He is experiencing symptoms related to his atrial fibrillation, including a lack of energy and a general sensation of feeling different, though not dizziness.  Approximately nine days ago, he experienced a dizzy spell while sitting in his car, describing it as similar to the sensation of 'when you hit the dip on a roller coaster.' This episode was brief and resolved quickly. He recalls a similar, more intense episode in December 2017, described as 'like riding one hell of a roller coaster.  He is currently taking Jardiance , which he notes has 'killed his sweet tooth' and is adjusting to its effects. He has not reported any significant side effects other than the change in energy levels.  His recent lab work from December and last week has been consistent and normal, and he has not had any abnormal rhythms since 2017 until the recent episode.  Socially, he is involved in the care of his grandson, picking him up and dropping him off at school.   he denies chest pain, palpitations, dyspnea, PND, orthopnea, nausea, vomiting, dizziness, syncope, edema, weight gain, or early satiety.   Review of systems complete and found to be negative unless listed in HPI.      EP Information / Studies  Reviewed:    EKG is ordered today. Personal review as below.  EKG Interpretation Date/Time:  Thursday April 05 2024 16:13:55 EST Ventricular Rate:  76 PR Interval:    QRS Duration:  120 QT Interval:  380 QTC Calculation: 427 R Axis:   -47  Text Interpretation: Atrial fibrillation Left axis deviation Non-specific intra-ventricular conduction delay When compared with ECG of 22-Feb-2024 08:46, No significant change was found Confirmed by Lowella Kindley ([id number]) on 04/05/2024 4:27:33 PM   ICD Interrogation-  reviewed in detail today,  See PACEART report.  Device History: Medtronic Single Chamber ICD implanted 12/01/2015 for VT/VF arrest History of appropriate therapy: Yes History of AAD therapy: Yes; currently on mexiletine   Risk Assessment/Calculations:    CHA2DS2-VASc Score = 3   This indicates a 3.2% annual risk of stroke. The patient's score is based upon: CHF History: 1 HTN History: 1 Diabetes History: 0 Stroke History: 0 Vascular Disease History: 0 Age Score: 1 Gender Score: 0             Physical Exam:   VS:  BP 118/80 (BP Location: Left Arm, Patient Position: Sitting, Cuff Size: Large)   Pulse 76   Ht 5' 11 (1.803 m)   Wt 230 lb (104.3 kg)   SpO2 96%   BMI 32.08 kg/m    Wt Readings from Last 3 Encounters:  04/05/24 230 lb (104.3 kg)  02/22/24 240 lb 3.2 oz (109 kg)  02/07/24 240 lb (108.9 kg)     GEN: Well nourished, well developed in no acute distress NECK: No JVD;  No carotid bruits CARDIAC: Irregularly irregular rate and rhythm, no murmurs, rubs, gallops RESPIRATORY:  Clear to auscultation without rales, wheezing or rhonchi  ABDOMEN: Soft, non-tender, non-distended EXTREMITIES:  No edema; No deformity   ASSESSMENT AND PLAN:    Ventricular tachycardia s/p Medtronic single chamber ICD  euvolemic today Stable on an appropriate medical regimen Normal ICD function See Pace Art report Recently had ATP therapy for ventricular tachycardia on 03/27/2024.   Understands no driving for the next 6 months.  If he has further episodes of ventricular tachycardia, we Claudy Abdallah make medication adjustments at that time.  His labs have been stable over the last few months.  Clay Solum recheck preprocedure labs as well.  2.  Persistent atrial fibrillation: Patient had attempted cardioversion, but did not convert to normal rhythm.  He is on metoprolol .  He is well rate controlled.  He is scheduled for ablation upcoming.  Risks and benefits of ablation were discussed.  He understands the risks and has agreed to the procedure.  Risk, benefits, and alternatives to EP study and radiofrequency/pulse field ablation for afib were also discussed in detail today. These risks include but are not limited to stroke, bleeding, vascular damage, tamponade, perforation, damage to the esophagus, lungs, and other structures, pulmonary vein stenosis, worsening renal function, and death. The patient understands these risk and wishes to proceed.  We Nakiesha Rumsey therefore proceed with catheter ablation at the next available time.  Carto, ICE, anesthesia are requested for the procedure.  This patient Matelyn Antonelli NOT require CT prior to ablation  3.  Secondary hypercoagulable state: On Eliquis   4.  Chronic systolic heart failure: Ejection fraction 30% on echo 01/24/2024.  Elia Keenum need a recheck of his echocardiogram after ablation.  5.  Hypertension: Well-controlled  Disposition:   Follow up with EP Team as usual post procedure   Signed, Zadkiel Dragan Gladis Norton, MD  "

## 2024-04-06 ENCOUNTER — Ambulatory Visit: Payer: Self-pay | Admitting: Cardiology

## 2024-04-06 LAB — CUP PACEART INCLINIC DEVICE CHECK
Date Time Interrogation Session: 20260115160000
Implantable Lead Connection Status: 753985
Implantable Lead Implant Date: 20170911
Implantable Lead Location: 753860
Implantable Pulse Generator Implant Date: 20170911

## 2024-04-10 ENCOUNTER — Telehealth (HOSPITAL_COMMUNITY): Payer: Self-pay

## 2024-04-10 ENCOUNTER — Encounter (HOSPITAL_COMMUNITY): Payer: Self-pay

## 2024-04-10 ENCOUNTER — Other Ambulatory Visit (HOSPITAL_COMMUNITY): Payer: Self-pay

## 2024-04-10 DIAGNOSIS — I4819 Other persistent atrial fibrillation: Secondary | ICD-10-CM

## 2024-04-10 DIAGNOSIS — I472 Ventricular tachycardia, unspecified: Secondary | ICD-10-CM

## 2024-04-10 DIAGNOSIS — Z01812 Encounter for preprocedural laboratory examination: Secondary | ICD-10-CM

## 2024-04-10 NOTE — Telephone Encounter (Signed)
 Spoke with patient to discuss upcoming procedure.   CT: not required.  Labs: to be completed.   Any recent signs of acute illness or been started on antibiotics? No Any new medications started since pre-op visit? No  Any missed doses of blood thinner?  No  Advised patient to continue taking Eliquis  (Apixaban ) twice daily without missing any doses. Any medications to hold?  Yes;  HOLD: Empagliflozin  (Jardiance ) for 3 days prior to the procedure. Last dose on Friday, February 13.  Medication instructions:  On the morning of your procedure DO NOT take any medication., including Eliquis  (Apixaban ) or the procedure may be rescheduled. Nothing to eat or drink after midnight prior to your procedure.  Confirmed patient is scheduled for Atrial Fibrillation Ablation on Tuesday, February 17 with Dr. Inocencio. Instructed patient to arrive at the Main Entrance A at [facility]: [address] [place], [place] [address] and check in at Admitting at 7:30 AM.   Plan to go home the same day, you will only stay overnight if medically necessary. You MUST have a responsible adult to drive you home and MUST be with you the first 24 hours after you arrive home or your procedure could be cancelled.  Informed patient a nurse may call a day before the procedure to confirm arrival time and ensure instructions are followed.  Patient verbalized understanding to all instructions provided and agreed to proceed with procedure.   Advised patient to contact RN Navigator at [phone], to inform of any new medications started after call or concerns prior to procedure.

## 2024-04-19 ENCOUNTER — Other Ambulatory Visit: Payer: Self-pay | Admitting: Cardiovascular Disease

## 2024-04-20 LAB — CBC
Hematocrit: 46.4 % (ref 37.5–51.0)
Hemoglobin: 16 g/dL (ref 13.0–17.7)
MCH: 31.6 pg (ref 26.6–33.0)
MCHC: 34.5 g/dL (ref 31.5–35.7)
MCV: 92 fL (ref 79–97)
Platelets: 196 10*3/uL (ref 150–450)
RBC: 5.07 x10E6/uL (ref 4.14–5.80)
RDW: 11.9 % (ref 11.6–15.4)
WBC: 8.6 10*3/uL (ref 3.4–10.8)

## 2024-04-20 LAB — BASIC METABOLIC PANEL WITH GFR
BUN/Creatinine Ratio: 15 (ref 10–24)
BUN: 17 mg/dL (ref 8–27)
Calcium: 10.3 mg/dL — ABNORMAL HIGH (ref 8.6–10.2)
Chloride: 107 mmol/L — ABNORMAL HIGH (ref 96–106)
Creatinine, Ser: 1.1 mg/dL (ref 0.76–1.27)
Glucose: 89 mg/dL (ref 70–99)
Potassium: 4.9 mmol/L (ref 3.5–5.2)
Sodium: 141 mmol/L (ref 134–144)
eGFR: 73 mL/min/{1.73_m2}

## 2024-04-23 ENCOUNTER — Other Ambulatory Visit: Payer: Self-pay

## 2024-04-23 MED ORDER — SPIRONOLACTONE 25 MG PO TABS: 25.0000 mg | ORAL_TABLET | Freq: Every day | ORAL | 3 refills | Status: AC

## 2024-04-23 NOTE — Progress Notes (Signed)
 Sent in refill for 90 days and refills, because patient just saw Dr. Wonda in November.

## 2024-05-01 ENCOUNTER — Ambulatory Visit: Payer: Medicare (Managed Care) | Admitting: Cardiology

## 2024-05-02 ENCOUNTER — Ambulatory Visit: Payer: Medicare (Managed Care)

## 2024-05-08 ENCOUNTER — Encounter (HOSPITAL_COMMUNITY): Payer: Self-pay

## 2024-05-08 ENCOUNTER — Ambulatory Visit (HOSPITAL_COMMUNITY): Admit: 2024-05-08 | Payer: Medicare (Managed Care) | Admitting: Cardiology

## 2024-05-08 SURGERY — ATRIAL FIBRILLATION ABLATION
Anesthesia: General

## 2024-08-01 ENCOUNTER — Ambulatory Visit: Payer: Medicare (Managed Care)

## 2024-10-31 ENCOUNTER — Ambulatory Visit: Payer: Medicare (Managed Care)

## 2025-01-30 ENCOUNTER — Ambulatory Visit: Payer: Medicare (Managed Care)

## 2025-05-01 ENCOUNTER — Ambulatory Visit: Payer: Medicare (Managed Care)
